# Patient Record
Sex: Female | Born: 1955 | Race: White | Hispanic: No | Marital: Single | Smoking: Never smoker
Health system: Southern US, Community
[De-identification: ages and names within clinical notes are randomized; demographics above are authoritative.]

## PROBLEM LIST (undated history)

## (undated) DIAGNOSIS — H9190 Unspecified hearing loss, unspecified ear: Secondary | ICD-10-CM

## (undated) DIAGNOSIS — W19XXXA Unspecified fall, initial encounter: Secondary | ICD-10-CM

## (undated) HISTORY — DX: Unspecified hearing loss, unspecified ear: H91.90

---

## 2018-08-31 HISTORY — PX: BREAST BIOPSY: SHX20

## 2020-06-11 ENCOUNTER — Encounter: Payer: Self-pay | Admitting: Obstetrics and Gynecology

## 2020-06-12 ENCOUNTER — Encounter: Payer: Self-pay | Admitting: Obstetrics and Gynecology

## 2020-06-12 ENCOUNTER — Ambulatory Visit: Payer: Medicaid Other | Attending: Oncology | Admitting: *Deleted

## 2020-06-12 ENCOUNTER — Other Ambulatory Visit: Payer: Self-pay

## 2020-06-12 DIAGNOSIS — R87618 Other abnormal cytological findings on specimens from cervix uteri: Secondary | ICD-10-CM

## 2020-06-12 NOTE — Progress Notes (Signed)
Patient was scheduled for a televisit with [facility] today at 9:30.  She was referred from [facility] Department for further evaluation of a recent  Abnormal pap of a HPV positive unsatisfactory pap on 04/03/20.  The patient has had a negative HPV positive pap on 10/26/18.  It was recommended by her provider to have a colposcopy.  She is scheduled for a colpo with Dr. Amalia Hailey on 06/19/20 @ 11:00.  She was contacted by Laurine Blazer to prescreen for [facility].  Health history completed.  It was noted that the patient needed an annual mammogram.  Patient had a birads 4b mammogram on 10/12/18.  A biopsy was completed of the left breast on 04/19/19 that was negative. Patient is now due for routine screening.  She is agreeable to come in to the [facility] next Wednesday at 1:00 for clinical breast exam and then get her mammogram.

## 2020-06-19 ENCOUNTER — Ambulatory Visit: Payer: Medicaid Other | Attending: Oncology

## 2020-06-19 ENCOUNTER — Encounter: Payer: Self-pay | Admitting: Obstetrics and Gynecology

## 2020-06-19 ENCOUNTER — Ambulatory Visit (INDEPENDENT_AMBULATORY_CARE_PROVIDER_SITE_OTHER): Payer: Self-pay | Admitting: Obstetrics and Gynecology

## 2020-06-19 ENCOUNTER — Other Ambulatory Visit: Payer: Self-pay

## 2020-06-19 VITALS — BP 149/94 | HR 92 | Wt 159.7 lb

## 2020-06-19 DIAGNOSIS — N72 Inflammatory disease of cervix uteri: Secondary | ICD-10-CM

## 2020-06-19 DIAGNOSIS — B977 Papillomavirus as the cause of diseases classified elsewhere: Secondary | ICD-10-CM

## 2020-06-19 NOTE — Progress Notes (Signed)
Referring Provider:  [facility]  HPI:  Jennifer Pacheco is a 64 y.o.  No obstetric history on file.  who presents today for evaluation and management of abnormal cervical cytology.    Dysplasia History:       10/26/18 -normal cytology -positive HPV     04/03/20 - " unsatisfactory" -positive HPV  ROS:  Pertinent items noted in HPI and remainder of comprehensive ROS otherwise negative.  OB History  No obstetric history on file.    History reviewed. No pertinent past medical history.  History reviewed. No pertinent surgical history.  SOCIAL HISTORY: Social History   Substance and Sexual Activity  Alcohol Use None   Social History   Substance and Sexual Activity  Drug Use Not on file     History reviewed. No pertinent family history.  ALLERGIES:  Penicillins  She has a current medication list which includes the following prescription(s): cyclobenzaprine, lisinopril, meloxicam, metformin, and omega-3.  Physical Exam: -Vitals:  BP (!) 149/94   Pulse 92   Wt 159 lb 11.2 oz (72.4 kg)   PROCEDURE: Colposcopy performed with 4% acetic acid after verbal consent obtained             Significant cervical stenosis hampering ECC. -ECC collected with Cytobrush.                -Aceto-white Lesions Location(s): 10 o'clock.              -Biopsy performed at 10 o'clock               -ECC indicated and performed: Yes.       -Biopsy sites made hemostatic with pressure and Monsel's solution   -Satisfactory colposcopy: No.    -Evidence of Invasive cervical CA :  NO  ASSESSMENT:  Jennifer Pacheco is a 64 y.o. No obstetric history on file. here for  1. High risk human papilloma virus (HPV) infection of cervix   .  PLAN: 1.  I discussed the grading system of pap smears and HPV high risk viral types.  We will discuss management after colpo results return.  No orders of the defined types were placed in this encounter.          F/U  Return in about 2 weeks (around 07/03/2020) for Colpo  f/u.  Jeannie Fend ,MD 06/19/2020,11:35 AM

## 2020-06-20 ENCOUNTER — Other Ambulatory Visit: Payer: Self-pay | Admitting: *Deleted

## 2020-06-20 ENCOUNTER — Other Ambulatory Visit: Payer: Self-pay | Admitting: Obstetrics and Gynecology

## 2020-06-20 ENCOUNTER — Inpatient Hospital Stay
Admission: RE | Admit: 2020-06-20 | Discharge: 2020-06-20 | Disposition: A | Discharge status: Home / Self Care | Payer: Self-pay | Source: Ambulatory Visit | Attending: *Deleted | Admitting: *Deleted

## 2020-06-20 DIAGNOSIS — Z0000 Encounter for general adult medical examination without abnormal findings: Secondary | ICD-10-CM

## 2020-06-20 DIAGNOSIS — Z1231 Encounter for screening mammogram for malignant neoplasm of breast: Secondary | ICD-10-CM

## 2020-06-25 ENCOUNTER — Other Ambulatory Visit: Payer: Self-pay | Admitting: *Deleted

## 2020-06-25 ENCOUNTER — Other Ambulatory Visit: Payer: Self-pay | Admitting: Neurology

## 2020-06-25 ENCOUNTER — Inpatient Hospital Stay
Admission: RE | Admit: 2020-06-25 | Discharge: 2020-06-25 | Disposition: A | Discharge status: Home / Self Care | Payer: Self-pay | Source: Ambulatory Visit | Attending: *Deleted | Admitting: *Deleted

## 2020-06-25 DIAGNOSIS — Z1231 Encounter for screening mammogram for malignant neoplasm of breast: Secondary | ICD-10-CM

## 2020-06-25 LAB — ANATOMIC PATHOLOGY REPORT

## 2020-06-28 ENCOUNTER — Telehealth: Payer: Self-pay

## 2020-06-28 NOTE — Telephone Encounter (Signed)
Called pt to move her appt the pt was schedule in the wrong time slot for the providers template. The pt was contact and told that she was schedule in the wrong time slot the pt verbally understand and was given two times that we can move her appt to. The pt said that she will call her ride and call us back.

## 2020-07-03 ENCOUNTER — Encounter: Payer: Medicaid Other | Admitting: Obstetrics and Gynecology

## 2020-07-03 ENCOUNTER — Telehealth: Payer: Self-pay

## 2020-07-03 NOTE — Telephone Encounter (Signed)
Called patient to get her rescheduled for her appointment that she no showed, was unable to reach patient, left a message for her to call the office if she desired to reschedule.

## 2020-07-04 ENCOUNTER — Encounter: Payer: Medicaid Other | Admitting: Obstetrics and Gynecology

## 2020-07-15 ENCOUNTER — Encounter: Payer: Self-pay | Admitting: Obstetrics and Gynecology

## 2020-07-15 ENCOUNTER — Other Ambulatory Visit: Payer: Self-pay

## 2020-07-15 ENCOUNTER — Ambulatory Visit (INDEPENDENT_AMBULATORY_CARE_PROVIDER_SITE_OTHER): Payer: Self-pay | Admitting: Obstetrics and Gynecology

## 2020-07-15 VITALS — BP 149/85 | HR 87 | Wt 161.7 lb

## 2020-07-15 DIAGNOSIS — N72 Inflammatory disease of cervix uteri: Secondary | ICD-10-CM

## 2020-07-15 DIAGNOSIS — B977 Papillomavirus as the cause of diseases classified elsewhere: Secondary | ICD-10-CM

## 2020-07-15 NOTE — Progress Notes (Signed)
HPI:      [name] is a 64 y.o. No obstetric history on file. who LMP was No LMP recorded. Patient is postmenopausal.  Subjective:   She presents today as a follow-up for her colposcopy.  Colposcopy revealed no cytologic abnormalities but as suspected did not contain any transition zone.  Transition zone located within the endocervical canal.  Patient repeatedly lacks cytologic abnormalities but has been found to have positive HPV.    Hx: The following portions of the patient's history were reviewed and updated as appropriate:             She  has no past medical history on file. She does not have a problem list on file. She  has no past surgical history on file. Her family history is not on file. She  has no history on file for tobacco use, alcohol use, and drug use. She has a current medication list which includes the following prescription(s): cyclobenzaprine, lisinopril, meloxicam, metformin, and omega-3. She is allergic to penicillins.       Review of Systems:  Review of Systems  Constitutional: Denied constitutional symptoms, night sweats, recent illness, fatigue, fever, insomnia and weight loss.  Eyes: Denied eye symptoms, eye pain, photophobia, vision change and visual disturbance.  Ears/Nose/Throat/Neck: Denied ear, nose, throat or neck symptoms, hearing loss, nasal discharge, sinus congestion and sore throat.  Cardiovascular: Denied cardiovascular symptoms, arrhythmia, chest pain/pressure, edema, exercise intolerance, orthopnea and palpitations.  Respiratory: Denied pulmonary symptoms, asthma, pleuritic pain, productive sputum, cough, dyspnea and wheezing.  Gastrointestinal: Denied, gastro-esophageal reflux, melena, nausea and vomiting.  Genitourinary: Denied genitourinary symptoms including symptomatic vaginal discharge, pelvic relaxation issues, and urinary complaints.  Musculoskeletal: Denied musculoskeletal symptoms, stiffness, swelling, muscle weakness and  myalgia.  Dermatologic: Denied dermatology symptoms, rash and scar.  Neurologic: Denied neurology symptoms, dizziness, headache, neck pain and syncope.  Psychiatric: Denied psychiatric symptoms, anxiety and depression.  Endocrine: Denied endocrine symptoms including hot flashes and night sweats.   Meds:   Current Outpatient Medications on File Prior to Visit  Medication Sig Dispense Refill  . cyclobenzaprine (FLEXERIL) 10 MG tablet Take by mouth.    Marland Kitchen lisinopril (ZESTRIL) 20 MG tablet Take 20 mg by mouth daily.    . meloxicam (MOBIC) 15 MG tablet Take by mouth.    . metFORMIN (GLUCOPHAGE) 500 MG tablet Take by mouth.    . Omega-3 1000 MG CAPS Take by mouth.     No current facility-administered medications on file prior to visit.          Objective:     Vitals:   07/15/20 0940  BP: (!) 149/85  Pulse: 87   Filed Weights   07/15/20 0940  Weight: 161 lb 11.2 oz (73.3 kg)              No cytologic abnormalities at colposcopy  Assessment:    No obstetric history on file. There are no problems to display for this patient.    1. High risk human papilloma virus (HPV) infection of cervix     Patient has high risk HPV but no external cytologic abnormalities.   Plan:            1.  Because of her inadequate colposcopy but no cytologic external abnormalities with positive HPV -I have recommended a follow-up Pap smear cotest in 6 months.  At that time we will also perform an ECC (either Cytobrush or regular) to further assess the endocervical canal.  I discussed the rationale  for this in detail with the patient.  All questions answered. Orders No orders of the defined types were placed in this encounter.   No orders of the defined types were placed in this encounter.     F/U  Return in about 6 months (around 01/12/2021). I spent 21 minutes involved in the care of this patient preparing to see the patient by obtaining and reviewing her medical history (including labs,  imaging tests and prior procedures), documenting clinical information in the electronic health record (EHR), counseling and coordinating care plans, writing and sending prescriptions, ordering tests or procedures and directly communicating with the patient by discussing pertinent items from her history and physical exam as well as detailing my assessment and plan as noted above so that she has an informed understanding.  All of her questions were answered.  Finis Bud, M.D. 07/15/2020 10:20 AM

## 2020-08-08 ENCOUNTER — Other Ambulatory Visit: Payer: Self-pay

## 2020-08-08 ENCOUNTER — Ambulatory Visit
Admission: RE | Admit: 2020-08-08 | Discharge: 2020-08-08 | Disposition: A | Discharge status: Home / Self Care | Payer: Medicaid Other | Source: Ambulatory Visit | Attending: Oncology | Admitting: Oncology

## 2020-08-08 DIAGNOSIS — Z0000 Encounter for general adult medical examination without abnormal findings: Secondary | ICD-10-CM | POA: Insufficient documentation

## 2020-08-08 IMAGING — MG DIGITAL SCREENING BILAT W/ TOMO W/ CAD
8 series · 8 of 24 positions shown · non-contrast
Comparison: Previous exam(s).

CLINICAL DATA: Screening.

EXAM:
DIGITAL SCREENING BILATERAL MAMMOGRAM WITH TOMO AND CAD

[R CC synth-2D]
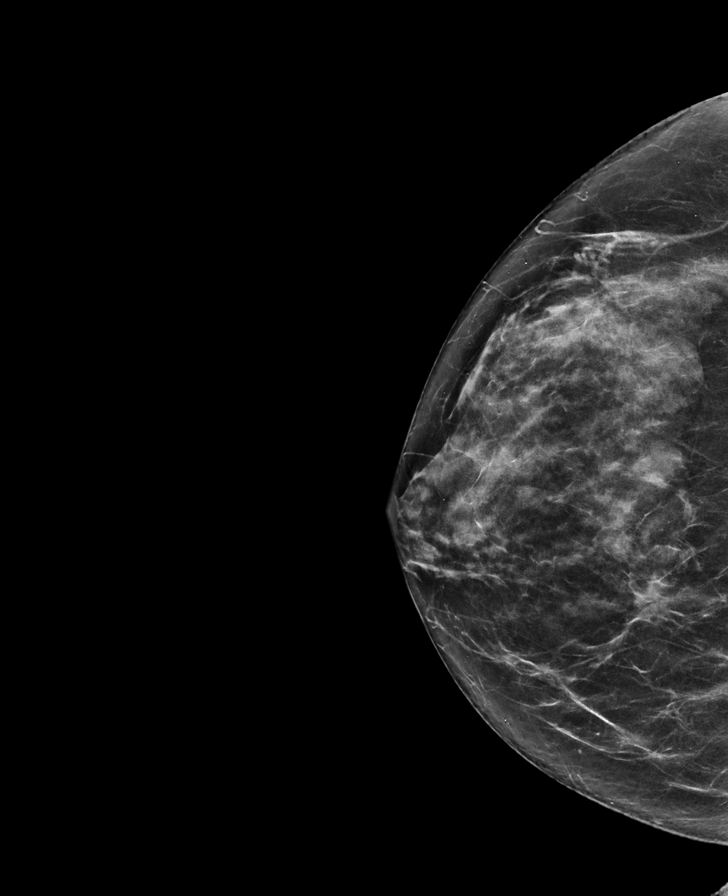

[L MLO synth-2D]
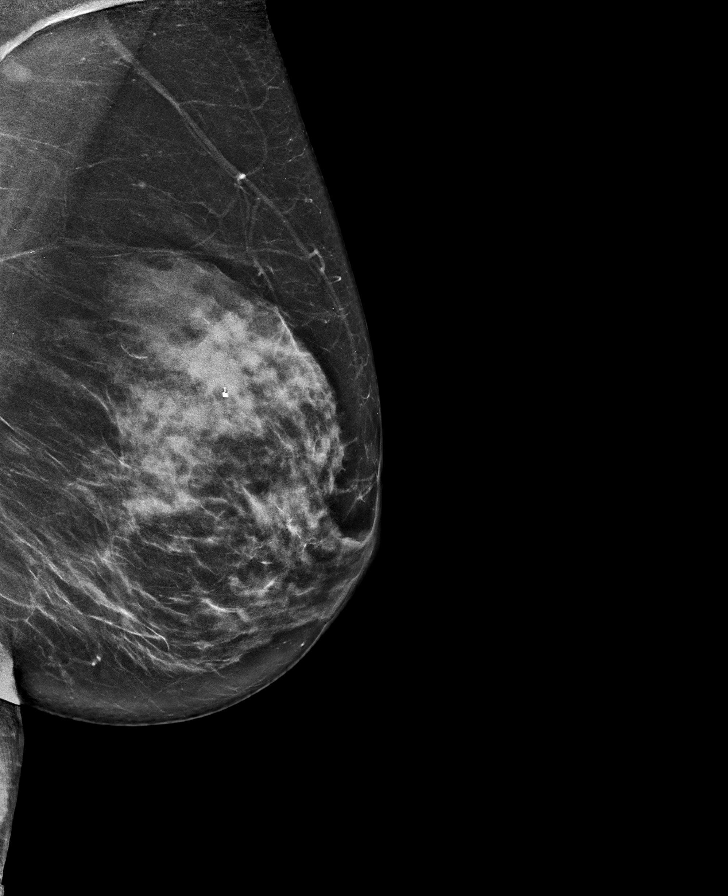

[L CC synth-2D]
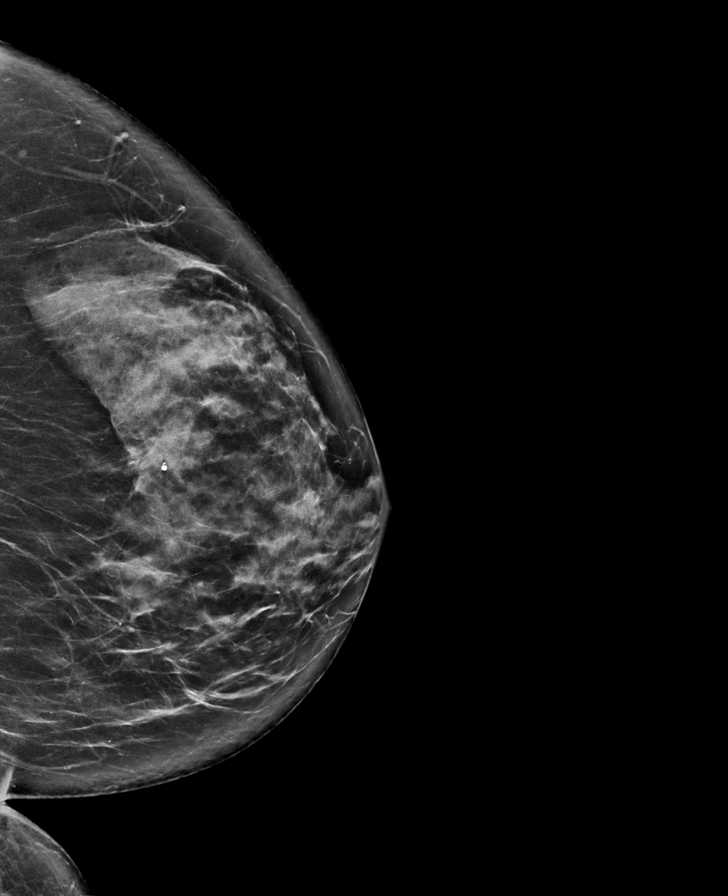

[R MLO synth-2D]
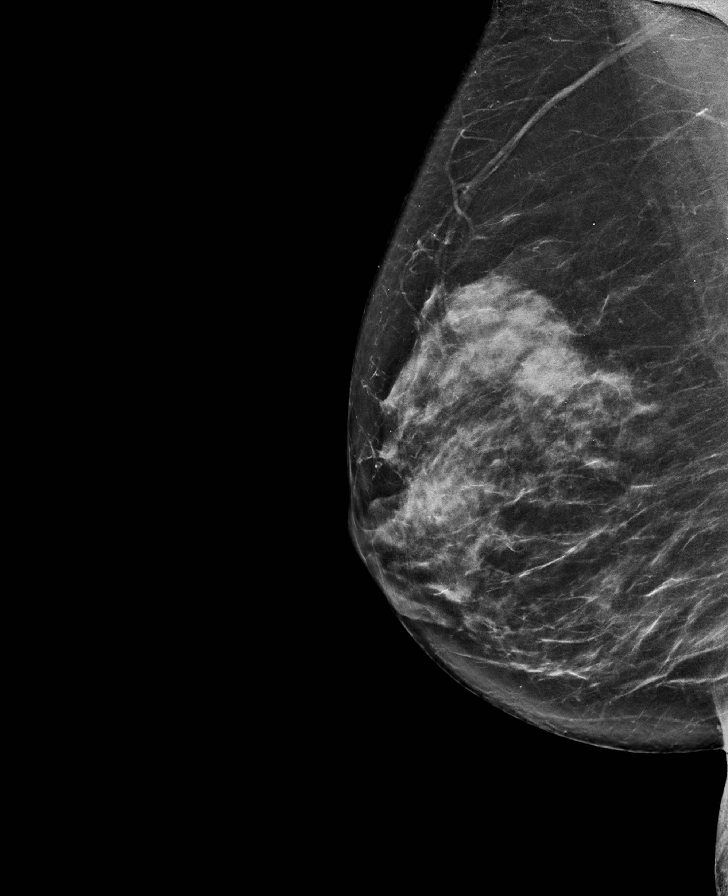

[L CC tomo · tomo slice 39/77.0]
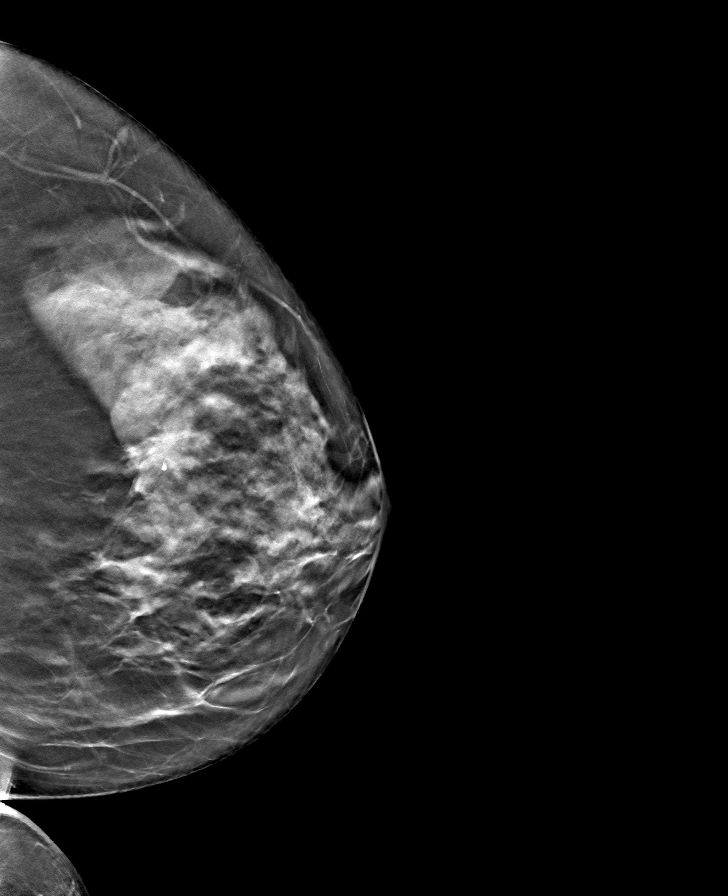

[R MLO tomo · tomo slice 41/80.0]
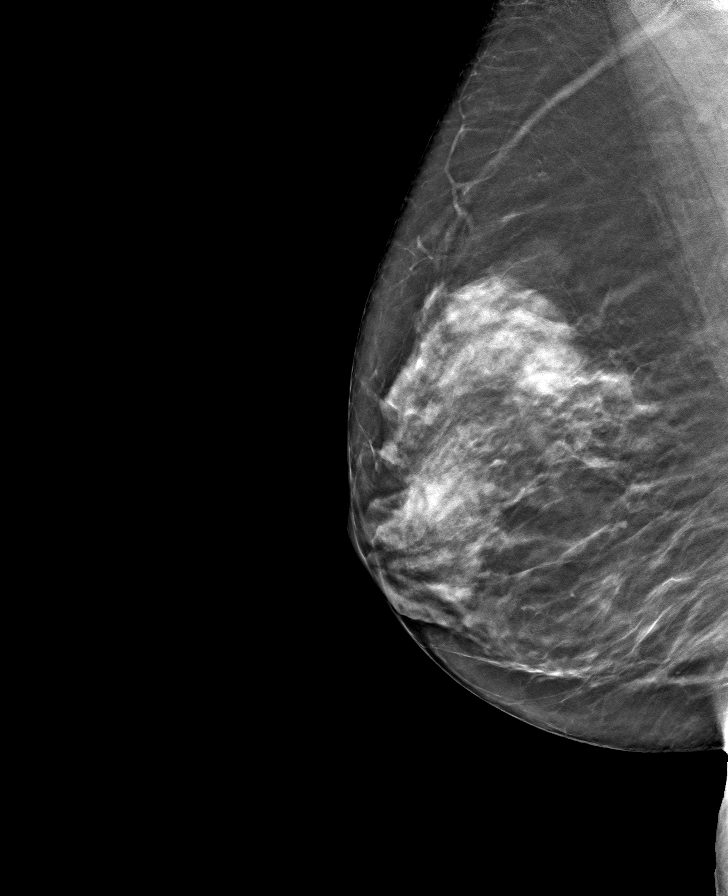

[L MLO tomo · tomo slice 37/74.0]
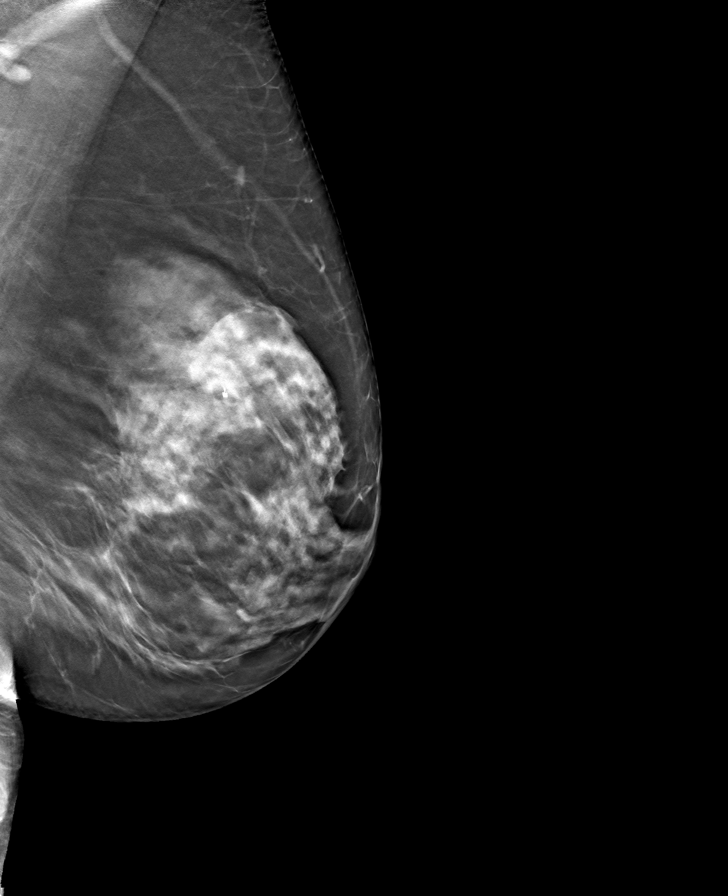

[R CC tomo · tomo slice 35/70.0]
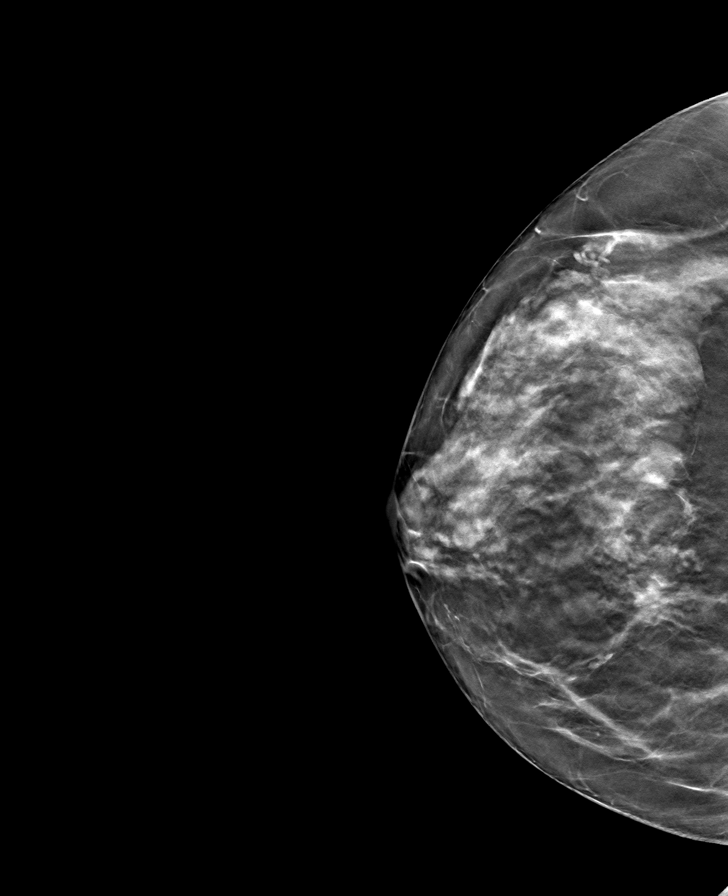

[8 of 24 positions shown; findings below may reference images not displayed]

ACR Breast Density Category c: The breast tissue is heterogeneously
dense, which may obscure small masses.
FINDINGS: There are no findings suspicious for malignancy. Images were
processed with CAD.
IMPRESSION: No mammographic evidence of malignancy. A result letter of this
screening mammogram will be mailed directly to the patient.

RECOMMENDATION:
Screening mammogram in one year. (Code:[5V])

BI-RADS CATEGORY  1: Negative.

## 2020-08-15 ENCOUNTER — Inpatient Hospital Stay
Admission: RE | Admit: 2020-08-15 | Discharge: 2020-08-15 | Disposition: A | Discharge status: Home / Self Care | Payer: Self-pay | Source: Ambulatory Visit | Attending: *Deleted | Admitting: *Deleted

## 2020-08-15 ENCOUNTER — Other Ambulatory Visit: Payer: Self-pay | Admitting: *Deleted

## 2020-08-15 ENCOUNTER — Encounter: Payer: Self-pay | Admitting: *Deleted

## 2020-08-15 DIAGNOSIS — Z1231 Encounter for screening mammogram for malignant neoplasm of breast: Secondary | ICD-10-CM

## 2021-01-14 ENCOUNTER — Other Ambulatory Visit: Payer: Self-pay

## 2021-01-14 ENCOUNTER — Ambulatory Visit (INDEPENDENT_AMBULATORY_CARE_PROVIDER_SITE_OTHER): Payer: Self-pay | Admitting: Obstetrics and Gynecology

## 2021-01-14 ENCOUNTER — Other Ambulatory Visit (HOSPITAL_COMMUNITY)
Admission: RE | Admit: 2021-01-14 | Discharge: 2021-01-14 | Disposition: A | Discharge status: Home / Self Care | Payer: Medicaid Other | Source: Ambulatory Visit | Attending: Obstetrics and Gynecology | Admitting: Obstetrics and Gynecology

## 2021-01-14 ENCOUNTER — Encounter: Payer: Self-pay | Admitting: Obstetrics and Gynecology

## 2021-01-14 VITALS — BP 146/74 | HR 80 | Ht 66.0 in | Wt 154.0 lb

## 2021-01-14 DIAGNOSIS — N72 Inflammatory disease of cervix uteri: Secondary | ICD-10-CM | POA: Diagnosis not present

## 2021-01-14 DIAGNOSIS — B977 Papillomavirus as the cause of diseases classified elsewhere: Secondary | ICD-10-CM | POA: Diagnosis present

## 2021-01-14 NOTE — Addendum Note (Signed)
Addended by: Durwin Glaze on: 01/14/2021 11:27 AM   Modules accepted: Orders

## 2021-01-14 NOTE — Progress Notes (Signed)
HPI:      [name] is a 65 y.o. No obstetric history on file. who LMP was No LMP recorded. Patient is postmenopausal.  Subjective:   She presents today for a follow-up Pap smear after previous colposcopy.  The colposcopy was somewhat inadequate and that the patient has cervical stenosis and it was difficult to visualize the T-zone.  Biopsies performed at that time showed no dysplasia. Her Pap smear prior to that was considered unsatisfactory because of a lack of cells but was positive for high risk HPV.    Hx: The following portions of the patient's history were reviewed and updated as appropriate:             She  has a past medical history of Hard of hearing. She does not have a problem list on file. She  has a past surgical history that includes Breast biopsy (Left, 2020). Her family history includes Breast cancer in her maternal aunt. She  reports that she has never smoked. She has never used smokeless tobacco. She reports previous alcohol use. She reports current drug use. Drug: Marijuana. She has a current medication list which includes the following prescription(s): cyclobenzaprine, lisinopril, meloxicam, metformin, and omega-3. She is allergic to penicillins.       Review of Systems:  Review of Systems  Constitutional: Denied constitutional symptoms, night sweats, recent illness, fatigue, fever, insomnia and weight loss.  Eyes: Denied eye symptoms, eye pain, photophobia, vision change and visual disturbance.  Ears/Nose/Throat/Neck: Denied ear, nose, throat or neck symptoms, hearing loss, nasal discharge, sinus congestion and sore throat.  Cardiovascular: Denied cardiovascular symptoms, arrhythmia, chest pain/pressure, edema, exercise intolerance, orthopnea and palpitations.  Respiratory: Denied pulmonary symptoms, asthma, pleuritic pain, productive sputum, cough, dyspnea and wheezing.  Gastrointestinal: Denied, gastro-esophageal reflux, melena, nausea and vomiting.   Genitourinary: Denied genitourinary symptoms including symptomatic vaginal discharge, pelvic relaxation issues, and urinary complaints.  Musculoskeletal: Denied musculoskeletal symptoms, stiffness, swelling, muscle weakness and myalgia.  Dermatologic: Denied dermatology symptoms, rash and scar.  Neurologic: Denied neurology symptoms, dizziness, headache, neck pain and syncope.  Psychiatric: Denied psychiatric symptoms, anxiety and depression.  Endocrine: Denied endocrine symptoms including hot flashes and night sweats.   Meds:   Current Outpatient Medications on File Prior to Visit  Medication Sig Dispense Refill  . cyclobenzaprine (FLEXERIL) 10 MG tablet Take by mouth.    Marland Kitchen lisinopril (ZESTRIL) 20 MG tablet Take 20 mg by mouth daily.    . meloxicam (MOBIC) 15 MG tablet Take by mouth.    . metFORMIN (GLUCOPHAGE) 500 MG tablet Take by mouth.    . Omega-3 1000 MG CAPS Take by mouth.     No current facility-administered medications on file prior to visit.          Objective:     Vitals:   01/14/21 1022  BP: (!) 146/74  Pulse: 80   Filed Weights   01/14/21 1022  Weight: 154 lb (69.9 kg)              Physical examination   Pelvic:   Vulva: Normal appearance.  No lesions.  Vagina: No lesions or abnormalities noted.  Moderate vaginal atrophy  Support: Normal pelvic support.  Urethra No masses tenderness or scarring.  Meatus Normal size without lesions or prolapse.  Cervix: Normal appearance.  No lesions.  Cervical stenosis noted  Anus: Normal exam.  No lesions.  Perineum: Normal exam.  No lesions.     Assessment:    No obstetric history on  file. There are no problems to display for this patient.    1. High risk human papilloma virus (HPV) infection of cervix     Previous inadequate Pap smear with follow-up colposcopy where the T-zone could not be visualized.   Plan:            1.  Two separate specimens sent today:   Pap -with Co-test   Separate ECC  Cytobrush (limited insertion because of cervical stenosis) Orders No orders of the defined types were placed in this encounter.   No orders of the defined types were placed in this encounter.     F/U  Return for We will contact her with any abnormal test results. I spent 23 minutes involved in the care of this patient preparing to see the patient by obtaining and reviewing her medical history (including labs, imaging tests and prior procedures), documenting clinical information in the electronic health record (EHR), counseling and coordinating care plans, writing and sending prescriptions, ordering tests or procedures and directly communicating with the patient by discussing pertinent items from her history and physical exam as well as detailing my assessment and plan as noted above so that she has an informed understanding.  All of her questions were answered.  Finis Bud, M.D. 01/14/2021 10:55 AM

## 2021-01-17 ENCOUNTER — Telehealth: Payer: Self-pay | Admitting: Obstetrics and Gynecology

## 2021-01-17 LAB — CYTOLOGY - PAP
Comment: NEGATIVE
Diagnosis: NEGATIVE
Diagnosis: NEGATIVE
High risk HPV: NEGATIVE

## 2021-01-17 NOTE — Telephone Encounter (Signed)
Left message notifying patient that Pap smear results are not back.  Lab was processed on 01/14/2021 and it can take up to a week for it to be resulted.  For your information.

## 2021-01-17 NOTE — Telephone Encounter (Signed)
Patient called inquiring about recent labs and is requesting results. Please Advise.

## 2021-01-28 ENCOUNTER — Telehealth: Payer: Self-pay | Admitting: Obstetrics and Gynecology

## 2021-01-28 NOTE — Telephone Encounter (Signed)
LM for patient to return call.

## 2021-01-28 NOTE — Telephone Encounter (Signed)
Notified patient that labs was normal per Dr. Amalia Hailey.

## 2021-01-28 NOTE — Telephone Encounter (Signed)
Jennifer Pacheco called and requested her results.  She states she hasn't heard anything.  Please advise.

## 2022-01-30 ENCOUNTER — Inpatient Hospital Stay (HOSPITAL_COMMUNITY)
Admission: EM | Admit: 2022-01-30 | Discharge: 2022-04-01 | DRG: 175 | Disposition: A | Discharge status: Skilled Nursing Facility | Payer: Medicaid Other | Attending: Internal Medicine | Admitting: Internal Medicine

## 2022-01-30 ENCOUNTER — Emergency Department (HOSPITAL_COMMUNITY): Payer: Medicaid Other

## 2022-01-30 ENCOUNTER — Encounter (HOSPITAL_COMMUNITY): Payer: Self-pay

## 2022-01-30 ENCOUNTER — Other Ambulatory Visit: Payer: Self-pay

## 2022-01-30 DIAGNOSIS — N179 Acute kidney failure, unspecified: Secondary | ICD-10-CM

## 2022-01-30 DIAGNOSIS — R1314 Dysphagia, pharyngoesophageal phase: Secondary | ICD-10-CM | POA: Diagnosis present

## 2022-01-30 DIAGNOSIS — G9341 Metabolic encephalopathy: Secondary | ICD-10-CM | POA: Diagnosis present

## 2022-01-30 DIAGNOSIS — E872 Acidosis, unspecified: Secondary | ICD-10-CM | POA: Diagnosis present

## 2022-01-30 DIAGNOSIS — E876 Hypokalemia: Secondary | ICD-10-CM | POA: Diagnosis not present

## 2022-01-30 DIAGNOSIS — K8020 Calculus of gallbladder without cholecystitis without obstruction: Secondary | ICD-10-CM | POA: Diagnosis present

## 2022-01-30 DIAGNOSIS — R1319 Other dysphagia: Secondary | ICD-10-CM | POA: Diagnosis present

## 2022-01-30 DIAGNOSIS — E871 Hypo-osmolality and hyponatremia: Secondary | ICD-10-CM | POA: Diagnosis present

## 2022-01-30 DIAGNOSIS — Z880 Allergy status to penicillin: Secondary | ICD-10-CM

## 2022-01-30 DIAGNOSIS — R5381 Other malaise: Secondary | ICD-10-CM | POA: Diagnosis present

## 2022-01-30 DIAGNOSIS — R451 Restlessness and agitation: Secondary | ICD-10-CM | POA: Diagnosis not present

## 2022-01-30 DIAGNOSIS — Z751 Person awaiting admission to adequate facility elsewhere: Secondary | ICD-10-CM

## 2022-01-30 DIAGNOSIS — E861 Hypovolemia: Secondary | ICD-10-CM | POA: Diagnosis present

## 2022-01-30 DIAGNOSIS — K219 Gastro-esophageal reflux disease without esophagitis: Secondary | ICD-10-CM | POA: Diagnosis not present

## 2022-01-30 DIAGNOSIS — I248 Other forms of acute ischemic heart disease: Secondary | ICD-10-CM | POA: Diagnosis present

## 2022-01-30 DIAGNOSIS — Z79899 Other long term (current) drug therapy: Secondary | ICD-10-CM

## 2022-01-30 DIAGNOSIS — G9389 Other specified disorders of brain: Secondary | ICD-10-CM

## 2022-01-30 DIAGNOSIS — I82422 Acute embolism and thrombosis of left iliac vein: Secondary | ICD-10-CM | POA: Diagnosis not present

## 2022-01-30 DIAGNOSIS — Z20822 Contact with and (suspected) exposure to covid-19: Secondary | ICD-10-CM | POA: Diagnosis present

## 2022-01-30 DIAGNOSIS — R404 Transient alteration of awareness: Secondary | ICD-10-CM | POA: Diagnosis not present

## 2022-01-30 DIAGNOSIS — E878 Other disorders of electrolyte and fluid balance, not elsewhere classified: Secondary | ICD-10-CM | POA: Diagnosis not present

## 2022-01-30 DIAGNOSIS — D320 Benign neoplasm of cerebral meninges: Secondary | ICD-10-CM | POA: Diagnosis present

## 2022-01-30 DIAGNOSIS — I82402 Acute embolism and thrombosis of unspecified deep veins of left lower extremity: Secondary | ICD-10-CM | POA: Diagnosis not present

## 2022-01-30 DIAGNOSIS — G935 Compression of brain: Secondary | ICD-10-CM | POA: Diagnosis present

## 2022-01-30 DIAGNOSIS — J9601 Acute respiratory failure with hypoxia: Secondary | ICD-10-CM

## 2022-01-30 DIAGNOSIS — I2699 Other pulmonary embolism without acute cor pulmonale: Secondary | ICD-10-CM | POA: Diagnosis present

## 2022-01-30 DIAGNOSIS — E1165 Type 2 diabetes mellitus with hyperglycemia: Secondary | ICD-10-CM | POA: Diagnosis present

## 2022-01-30 DIAGNOSIS — M6282 Rhabdomyolysis: Secondary | ICD-10-CM | POA: Diagnosis present

## 2022-01-30 DIAGNOSIS — L8990 Pressure ulcer of unspecified site, unspecified stage: Secondary | ICD-10-CM | POA: Diagnosis present

## 2022-01-30 DIAGNOSIS — D62 Acute posthemorrhagic anemia: Secondary | ICD-10-CM | POA: Diagnosis present

## 2022-01-30 DIAGNOSIS — D638 Anemia in other chronic diseases classified elsewhere: Secondary | ICD-10-CM | POA: Diagnosis present

## 2022-01-30 DIAGNOSIS — E11649 Type 2 diabetes mellitus with hypoglycemia without coma: Secondary | ICD-10-CM | POA: Diagnosis not present

## 2022-01-30 DIAGNOSIS — K2289 Other specified disease of esophagus: Secondary | ICD-10-CM

## 2022-01-30 DIAGNOSIS — R41841 Cognitive communication deficit: Secondary | ICD-10-CM | POA: Diagnosis present

## 2022-01-30 DIAGNOSIS — E119 Type 2 diabetes mellitus without complications: Secondary | ICD-10-CM | POA: Diagnosis not present

## 2022-01-30 DIAGNOSIS — R000 Tachycardia, unspecified: Secondary | ICD-10-CM | POA: Diagnosis present

## 2022-01-30 DIAGNOSIS — R482 Apraxia: Secondary | ICD-10-CM | POA: Diagnosis present

## 2022-01-30 DIAGNOSIS — D72825 Bandemia: Secondary | ICD-10-CM

## 2022-01-30 DIAGNOSIS — R410 Disorientation, unspecified: Secondary | ICD-10-CM | POA: Diagnosis not present

## 2022-01-30 DIAGNOSIS — I10 Essential (primary) hypertension: Secondary | ICD-10-CM | POA: Diagnosis present

## 2022-01-30 DIAGNOSIS — D6832 Hemorrhagic disorder due to extrinsic circulating anticoagulants: Secondary | ICD-10-CM | POA: Diagnosis not present

## 2022-01-30 DIAGNOSIS — L89152 Pressure ulcer of sacral region, stage 2: Secondary | ICD-10-CM | POA: Diagnosis present

## 2022-01-30 DIAGNOSIS — Z7984 Long term (current) use of oral hypoglycemic drugs: Secondary | ICD-10-CM

## 2022-01-30 DIAGNOSIS — Z638 Other specified problems related to primary support group: Secondary | ICD-10-CM

## 2022-01-30 DIAGNOSIS — I2609 Other pulmonary embolism with acute cor pulmonale: Principal | ICD-10-CM | POA: Diagnosis present

## 2022-01-30 DIAGNOSIS — W19XXXA Unspecified fall, initial encounter: Secondary | ICD-10-CM | POA: Diagnosis present

## 2022-01-30 DIAGNOSIS — K661 Hemoperitoneum: Secondary | ICD-10-CM | POA: Diagnosis not present

## 2022-01-30 DIAGNOSIS — R52 Pain, unspecified: Secondary | ICD-10-CM | POA: Diagnosis not present

## 2022-01-30 DIAGNOSIS — R579 Shock, unspecified: Secondary | ICD-10-CM | POA: Diagnosis not present

## 2022-01-30 DIAGNOSIS — G8194 Hemiplegia, unspecified affecting left nondominant side: Secondary | ICD-10-CM | POA: Diagnosis present

## 2022-01-30 DIAGNOSIS — Z86010 Personal history of colonic polyps: Secondary | ICD-10-CM

## 2022-01-30 DIAGNOSIS — R5383 Other fatigue: Secondary | ICD-10-CM | POA: Diagnosis not present

## 2022-01-30 DIAGNOSIS — R471 Dysarthria and anarthria: Secondary | ICD-10-CM | POA: Diagnosis present

## 2022-01-30 DIAGNOSIS — R34 Anuria and oliguria: Secondary | ICD-10-CM | POA: Diagnosis not present

## 2022-01-30 DIAGNOSIS — R571 Hypovolemic shock: Secondary | ICD-10-CM | POA: Insufficient documentation

## 2022-01-30 DIAGNOSIS — Z602 Problems related to living alone: Secondary | ICD-10-CM | POA: Diagnosis present

## 2022-01-30 DIAGNOSIS — T45515A Adverse effect of anticoagulants, initial encounter: Secondary | ICD-10-CM | POA: Diagnosis not present

## 2022-01-30 DIAGNOSIS — Z515 Encounter for palliative care: Secondary | ICD-10-CM | POA: Diagnosis not present

## 2022-01-30 DIAGNOSIS — H9190 Unspecified hearing loss, unspecified ear: Secondary | ICD-10-CM | POA: Diagnosis present

## 2022-01-30 DIAGNOSIS — M1611 Unilateral primary osteoarthritis, right hip: Secondary | ICD-10-CM | POA: Diagnosis present

## 2022-01-30 DIAGNOSIS — I9589 Other hypotension: Secondary | ICD-10-CM | POA: Diagnosis not present

## 2022-01-30 DIAGNOSIS — G936 Cerebral edema: Secondary | ICD-10-CM | POA: Diagnosis not present

## 2022-01-30 DIAGNOSIS — I2602 Saddle embolus of pulmonary artery with acute cor pulmonale: Secondary | ICD-10-CM | POA: Diagnosis not present

## 2022-01-30 DIAGNOSIS — R933 Abnormal findings on diagnostic imaging of other parts of digestive tract: Secondary | ICD-10-CM

## 2022-01-30 DIAGNOSIS — R778 Other specified abnormalities of plasma proteins: Secondary | ICD-10-CM

## 2022-01-30 DIAGNOSIS — R1310 Dysphagia, unspecified: Secondary | ICD-10-CM | POA: Diagnosis not present

## 2022-01-30 DIAGNOSIS — N10 Acute tubulo-interstitial nephritis: Secondary | ICD-10-CM | POA: Diagnosis present

## 2022-01-30 DIAGNOSIS — I959 Hypotension, unspecified: Secondary | ICD-10-CM | POA: Diagnosis not present

## 2022-01-30 HISTORY — DX: Unspecified fall, initial encounter: W19.XXXA

## 2022-01-30 LAB — COMPREHENSIVE METABOLIC PANEL
ALT: 35 U/L (ref 0–44)
ALT: 37 U/L (ref 0–44)
AST: 69 U/L — ABNORMAL HIGH (ref 15–41)
AST: 59 U/L — ABNORMAL HIGH (ref 15–41)
Albumin: 3.4 g/dL — ABNORMAL LOW (ref 3.5–5.0)
Albumin: 2.8 g/dL — ABNORMAL LOW (ref 3.5–5.0)
Alkaline Phosphatase: 60 U/L (ref 38–126)
Alkaline Phosphatase: 52 U/L (ref 38–126)
Anion gap: 12 (ref 5–15)
Anion gap: 11 (ref 5–15)
BUN: 38 mg/dL — ABNORMAL HIGH (ref 8–23)
BUN: 26 mg/dL — ABNORMAL HIGH (ref 8–23)
CO2: 19 mmol/L — ABNORMAL LOW (ref 22–32)
CO2: 19 mmol/L — ABNORMAL LOW (ref 22–32)
Calcium: 8.6 mg/dL — ABNORMAL LOW (ref 8.9–10.3)
Calcium: 8.3 mg/dL — ABNORMAL LOW (ref 8.9–10.3)
Chloride: 104 mmol/L (ref 98–111)
Chloride: 105 mmol/L (ref 98–111)
Creatinine, Ser: 0.95 mg/dL (ref 0.44–1.00)
Creatinine, Ser: 0.66 mg/dL (ref 0.44–1.00)
GFR, Estimated: >60 mL/min (ref >60)
GFR, Estimated: >60 mL/min (ref >60)
Glucose, Bld: 209 mg/dL — ABNORMAL HIGH (ref 70–99)
Glucose, Bld: 137 mg/dL — ABNORMAL HIGH (ref 70–99)
Potassium: 3.2 mmol/L — ABNORMAL LOW (ref 3.5–5.1)
Potassium: 3.7 mmol/L (ref 3.5–5.1)
Sodium: 135 mmol/L (ref 135–145)
Sodium: 135 mmol/L (ref 135–145)
Total Bilirubin: 0.7 mg/dL (ref 0.3–1.2)
Total Bilirubin: 0.5 mg/dL (ref 0.3–1.2)
Total Protein: 7.4 g/dL (ref 6.5–8.1)
Total Protein: 6.3 g/dL — ABNORMAL LOW (ref 6.5–8.1)

## 2022-01-30 LAB — CBC WITH DIFFERENTIAL/PLATELET
Abs Immature Granulocytes: 0.2 10*3/uL — ABNORMAL HIGH (ref 0.00–0.07)
Basophils Absolute: 0.1 10*3/uL (ref 0.0–0.1)
Basophils Relative: 0 %
Eosinophils Absolute: 0 10*3/uL (ref 0.0–0.5)
Eosinophils Relative: 0 %
HCT: 27.4 % — ABNORMAL LOW (ref 36.0–46.0)
Hemoglobin: 7.9 g/dL — ABNORMAL LOW (ref 12.0–15.0)
Immature Granulocytes: 1 %
Lymphocytes Relative: 4 %
Lymphs Abs: 1 10*3/uL (ref 0.7–4.0)
MCH: 19.2 pg — ABNORMAL LOW (ref 26.0–34.0)
MCHC: 28.8 g/dL — ABNORMAL LOW (ref 30.0–36.0)
MCV: 66.5 fL — ABNORMAL LOW (ref 80.0–100.0)
Monocytes Absolute: 1.6 10*3/uL — ABNORMAL HIGH (ref 0.1–1.0)
Monocytes Relative: 7 %
Neutro Abs: 20.9 10*3/uL — ABNORMAL HIGH (ref 1.7–7.7)
Neutrophils Relative %: 88 %
Platelets: 578 10*3/uL — ABNORMAL HIGH (ref 150–400)
RBC: 4.12 MIL/uL (ref 3.87–5.11)
RDW: 20.8 % — ABNORMAL HIGH (ref 11.5–15.5)
WBC: 23.7 10*3/uL — ABNORMAL HIGH (ref 4.0–10.5)
nRBC: 0.1 % (ref 0.0–0.2)

## 2022-01-30 LAB — PROTIME-INR
INR: 1.3 — ABNORMAL HIGH (ref 0.8–1.2)
Prothrombin Time: 16.1 seconds — ABNORMAL HIGH (ref 11.4–15.2)

## 2022-01-30 LAB — URINALYSIS, ROUTINE W REFLEX MICROSCOPIC
Bilirubin Urine: NEGATIVE
Glucose, UA: NEGATIVE mg/dL
Ketones, ur: 20 mg/dL — AB
Nitrite: POSITIVE — AB
Protein, ur: 30 mg/dL — AB
Specific Gravity, Urine: 1.044 — ABNORMAL HIGH (ref 1.005–1.030)
pH: 5 (ref 5.0–8.0)

## 2022-01-30 LAB — I-STAT CHEM 8, ED
BUN: 34 mg/dL — ABNORMAL HIGH (ref 8–23)
Calcium, Ion: 1.13 mmol/L — ABNORMAL LOW (ref 1.15–1.40)
Chloride: 102 mmol/L (ref 98–111)
Creatinine, Ser: 0.9 mg/dL (ref 0.44–1.00)
Glucose, Bld: 209 mg/dL — ABNORMAL HIGH (ref 70–99)
HCT: 30 % — ABNORMAL LOW (ref 36.0–46.0)
Hemoglobin: 10.2 g/dL — ABNORMAL LOW (ref 12.0–15.0)
Potassium: 3.2 mmol/L — ABNORMAL LOW (ref 3.5–5.1)
Sodium: 136 mmol/L (ref 135–145)
TCO2: 22 mmol/L (ref 22–32)

## 2022-01-30 LAB — RAPID URINE DRUG SCREEN, HOSP PERFORMED
Amphetamines: NOT DETECTED
Barbiturates: NOT DETECTED
Benzodiazepines: NOT DETECTED
Cocaine: NOT DETECTED
Opiates: NOT DETECTED
Tetrahydrocannabinol: NOT DETECTED

## 2022-01-30 LAB — BLOOD GAS, ARTERIAL
Acid-base deficit: 2.8 mmol/L — ABNORMAL HIGH (ref 0.0–2.0)
Bicarbonate: 21.2 mmol/L (ref 20.0–28.0)
Drawn by: 22179
FIO2: 28 %
O2 Saturation: 94.3 %
Patient temperature: 36.9
pCO2 arterial: 32 mmHg (ref 32–48)
pH, Arterial: 7.43 (ref 7.35–7.45)
pO2, Arterial: 61 mmHg — ABNORMAL LOW (ref 83–108)

## 2022-01-30 LAB — TROPONIN I (HIGH SENSITIVITY)
Troponin I (High Sensitivity): 99 ng/L — ABNORMAL HIGH (ref <18)
Troponin I (High Sensitivity): 473 ng/L (ref <18)
Troponin I (High Sensitivity): 802 ng/L (ref <18)

## 2022-01-30 LAB — CBG MONITORING, ED: Glucose-Capillary: 263 mg/dL — ABNORMAL HIGH (ref 70–99)

## 2022-01-30 LAB — MRSA NEXT GEN BY PCR, NASAL: MRSA by PCR Next Gen: NOT DETECTED

## 2022-01-30 LAB — CBC
HCT: 23.9 % — ABNORMAL LOW (ref 36.0–46.0)
Hemoglobin: 6.8 g/dL — CL (ref 12.0–15.0)
MCH: 18.8 pg — ABNORMAL LOW (ref 26.0–34.0)
MCHC: 28.5 g/dL — ABNORMAL LOW (ref 30.0–36.0)
MCV: 66.2 fL — ABNORMAL LOW (ref 80.0–100.0)
Platelets: 447 10*3/uL — ABNORMAL HIGH (ref 150–400)
RBC: 3.61 MIL/uL — ABNORMAL LOW (ref 3.87–5.11)
RDW: 20.3 % — ABNORMAL HIGH (ref 11.5–15.5)
WBC: 15.7 10*3/uL — ABNORMAL HIGH (ref 4.0–10.5)
nRBC: 0 % (ref 0.0–0.2)

## 2022-01-30 LAB — RESP PANEL BY RT-PCR (FLU A&B, COVID) ARPGX2
Influenza A by PCR: NEGATIVE
Influenza B by PCR: NEGATIVE
SARS Coronavirus 2 by RT PCR: NEGATIVE

## 2022-01-30 LAB — LIPASE, BLOOD: Lipase: 37 U/L (ref 11–51)

## 2022-01-30 LAB — LACTIC ACID, PLASMA
Lactic Acid, Venous: 3.9 mmol/L (ref 0.5–1.9)
Lactic Acid, Venous: 2.6 mmol/L (ref 0.5–1.9)
Lactic Acid, Venous: 1.6 mmol/L (ref 0.5–1.9)

## 2022-01-30 LAB — MAGNESIUM: Magnesium: 1.7 mg/dL (ref 1.7–2.4)

## 2022-01-30 LAB — GLUCOSE, CAPILLARY: Glucose-Capillary: 132 mg/dL — ABNORMAL HIGH (ref 70–99)

## 2022-01-30 LAB — HEMOGLOBIN A1C
Hgb A1c MFr Bld: 5.5 % (ref 4.8–5.6)
Mean Plasma Glucose: 111.15 mg/dL

## 2022-01-30 LAB — TYPE AND SCREEN
ABO/RH(D): A POS
Antibody Screen: NEGATIVE

## 2022-01-30 LAB — BRAIN NATRIURETIC PEPTIDE: B Natriuretic Peptide: 196.7 pg/mL — ABNORMAL HIGH (ref 0.0–100.0)

## 2022-01-30 LAB — SALICYLATE LEVEL: Salicylate Lvl: <7 mg/dL — ABNORMAL LOW (ref 7.0–30.0)

## 2022-01-30 LAB — ACETAMINOPHEN LEVEL: Acetaminophen (Tylenol), Serum: <10 ug/mL — ABNORMAL LOW (ref 10–30)

## 2022-01-30 LAB — CK: Total CK: 1651 U/L — ABNORMAL HIGH (ref 38–234)

## 2022-01-30 LAB — ETHANOL: Alcohol, Ethyl (B): <10 mg/dL (ref <10)

## 2022-01-30 LAB — HEPARIN LEVEL (UNFRACTIONATED): Heparin Unfractionated: >1.1 IU/mL — ABNORMAL HIGH (ref 0.30–0.70)

## 2022-01-30 IMAGING — CT CT HEAD W/O CM
4 of 8 series · 14 of 47 positions shown, 15 images · non-contrast
Comparison: None.

CLINICAL DATA: Provided history: Mental status change, unknown
cause. Additional history provided: Mental status change, sepsis



[Series 2: head w o · axial · 0.48mm/px · z∈[-15,+70]mm · 3 of 35 slices shown, 4 images]
[im 9/35  brain]
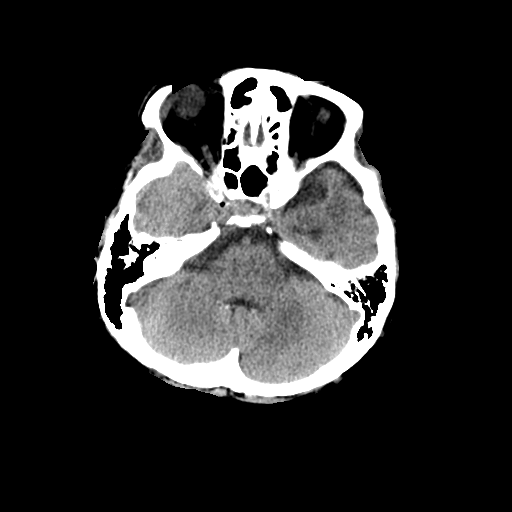
[im 9/35  bone]
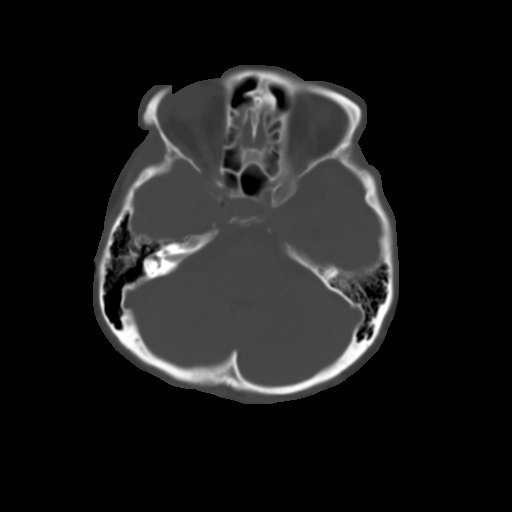
[im 18/35  brain]
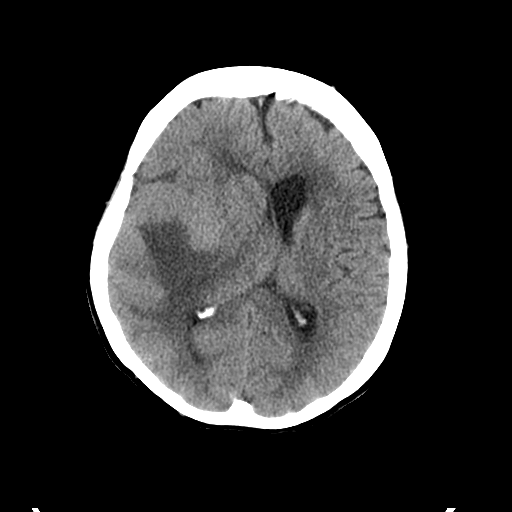
[im 26/35  brain]
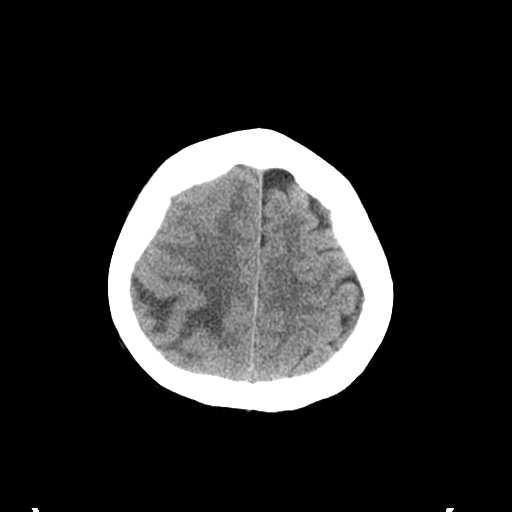

[Series 3: head bone · axial · 0.48mm/px · z∈[-43,+37]mm · 5 of 87 slices shown]
[im 7/87  bone]
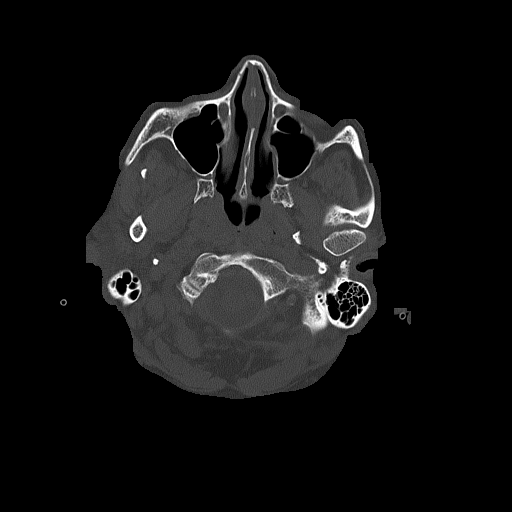
[im 20/87  bone]
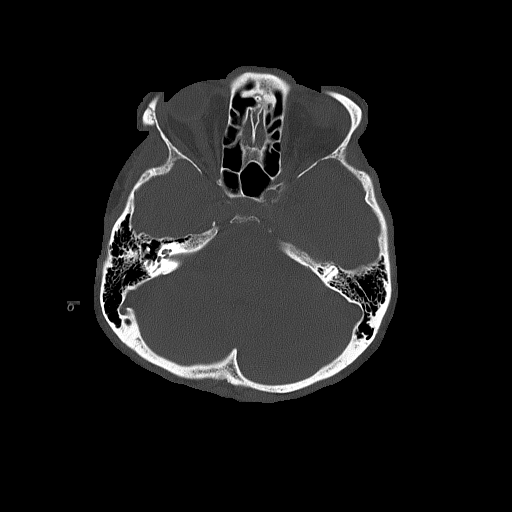
[im 27/87  bone]
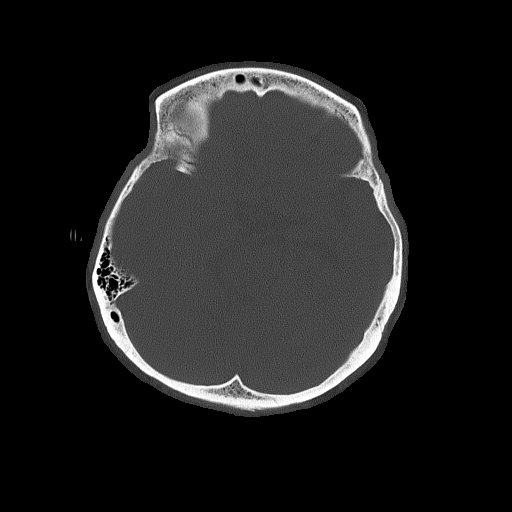
[im 40/87  bone]
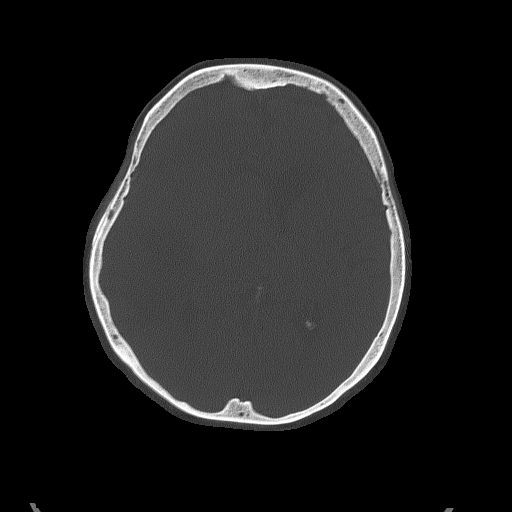
[im 47/87  bone]
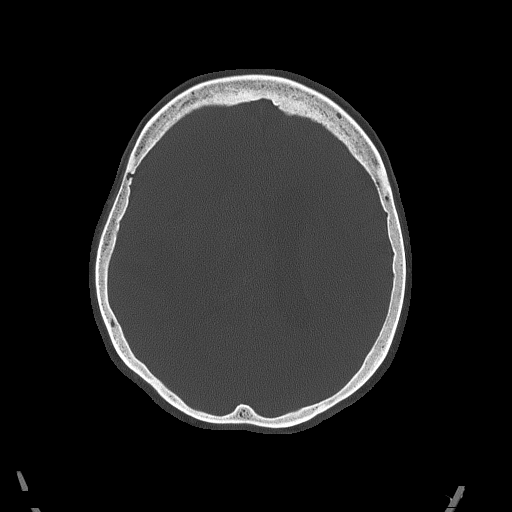

[Series 8: coronal soft · coronal · 0.22mm/px · 3 of 66 slices shown]
[im 17/66  brain]
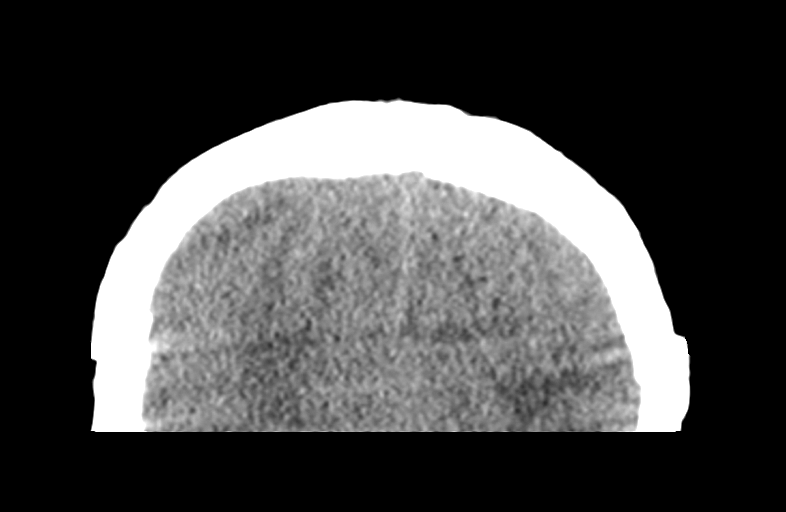
[im 33/66  brain]
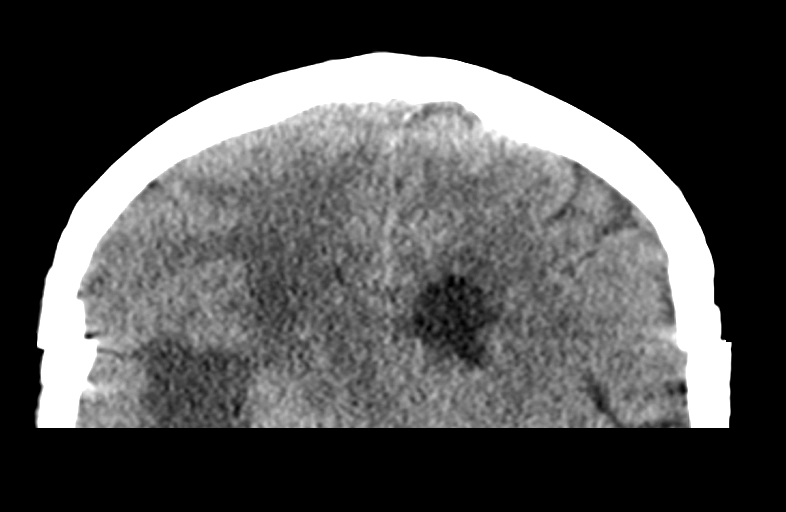
[im 49/66  brain]
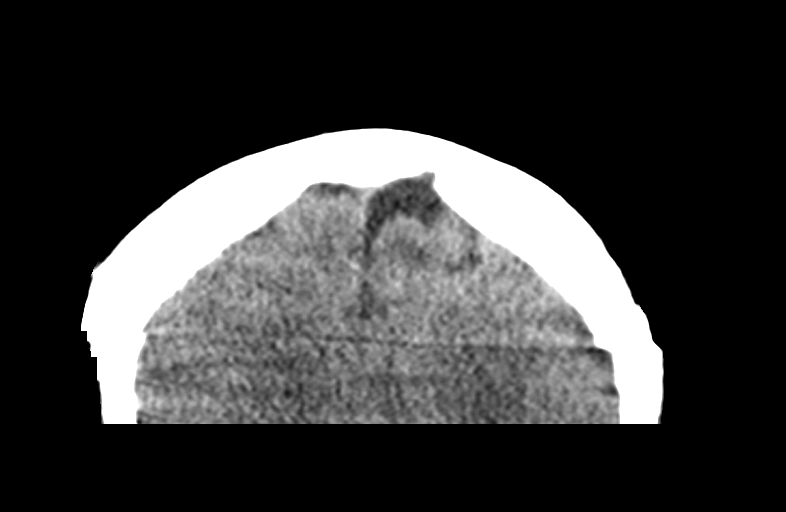

[Series 9: sagittal soft · sagittal · 0.22mm/px · 3 of 59 slices shown]
[im 12/59  brain]
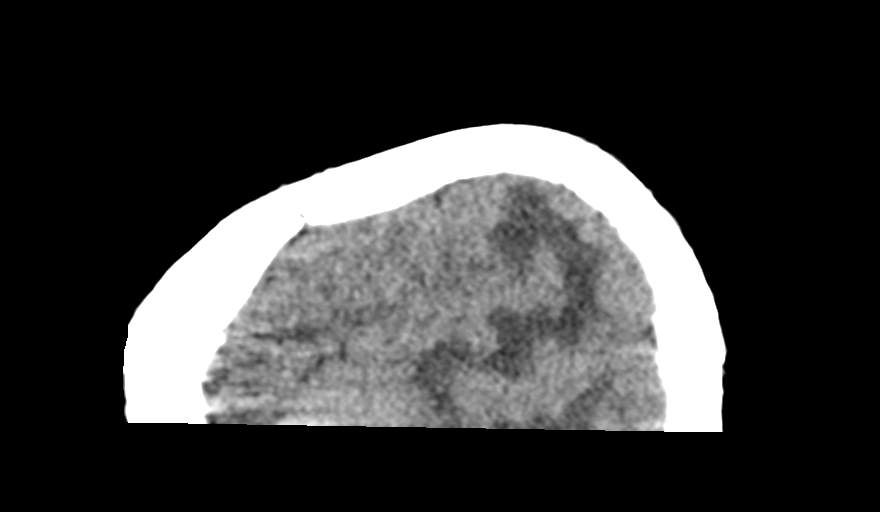
[im 24/59  brain]
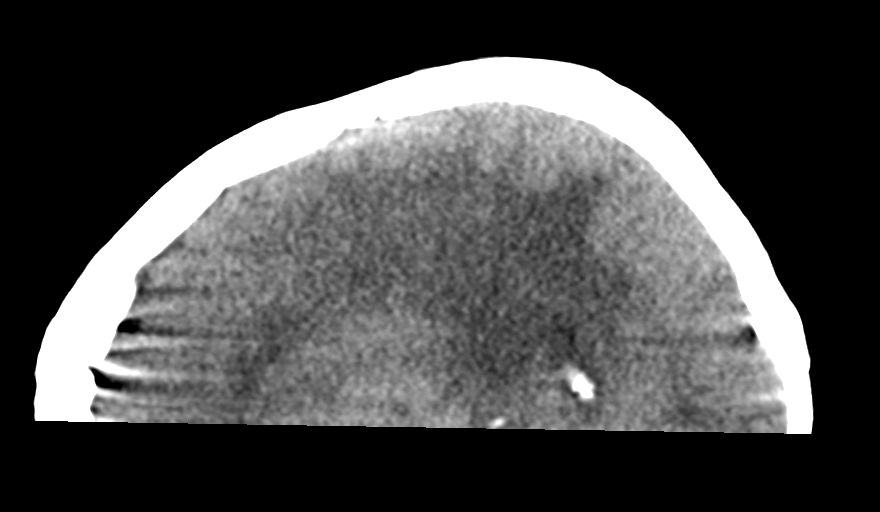
[im 35/59  brain]
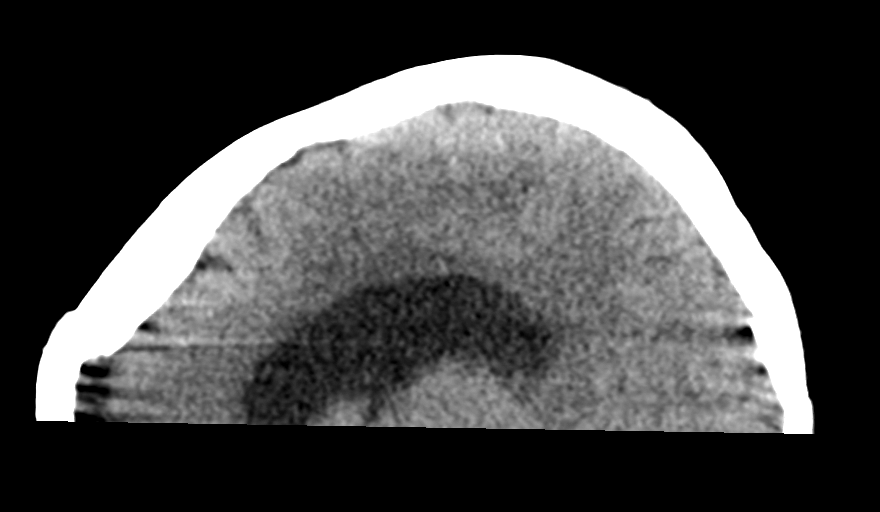

[14 of 47 positions shown; findings below may reference images not displayed]

FINDINGS: Brain:

Mild generalized cerebral atrophy.

Large subtly hyperdense irregular mass centered in the right basal
ganglia, right insula and adjacent right frontotemporal lobes.
Measurements are difficult to provide due to the irregularity and
ill-defined margins of the mass. Severe surrounding edema within the
right cerebral hemisphere. Mass effect with near complete effacement
of the right lateral ventricle and 11 mm leftward midline shift
measured at the level of the septum pellucidum.

Background patchy and ill-defined hypoattenuation within the
cerebral white matter, nonspecific but compatible with chronic small
vessel ischemic disease.

There is no acute intracranial hemorrhage.

No demarcated cortical infarct.

No extra-axial fluid collection.

Vascular: No hyperdense vessel.  Atherosclerotic calcifications.

Skull: No fracture or aggressive osseous lesion.

Sinuses/Orbits: No mass or acute finding within the imaged orbits.
Frothy secretions and small fluid level within the right sphenoid
sinus.

These results were called by telephone at the time of interpretation
on [DATE] at [DATE] to provider DEEQA RAYAAN , who verbally
acknowledged these results.
IMPRESSION: Large subtly hyperdense and irregular mass centered in the right
basal ganglia, right insula and adjacent right frontotemporal lobes.
Severe surrounding edema within the right cerebral hemisphere.
Resultant mass effect with near complete effacement of the right
lateral ventricle and 11 mm leftward midline shift. This is favored
to reflect a high-grade CNS neoplasm (such as glioblastoma
multiforme). A brain MRI without and with contrast is recommended.
Neurosurgical consultation is also recommended.

Background chronic small ischemic changes within the cerebral white
matter.

Mild generalized cerebral atrophy.

Right sphenoid sinusitis.

## 2022-01-30 IMAGING — DX DG CHEST 1V
1 series · 1 of 1 positions shown · non-contrast
Comparison: None available

CLINICAL DATA: Altered mental status.  Fall.

EXAM:
CHEST  1 VIEW

[chest ap]
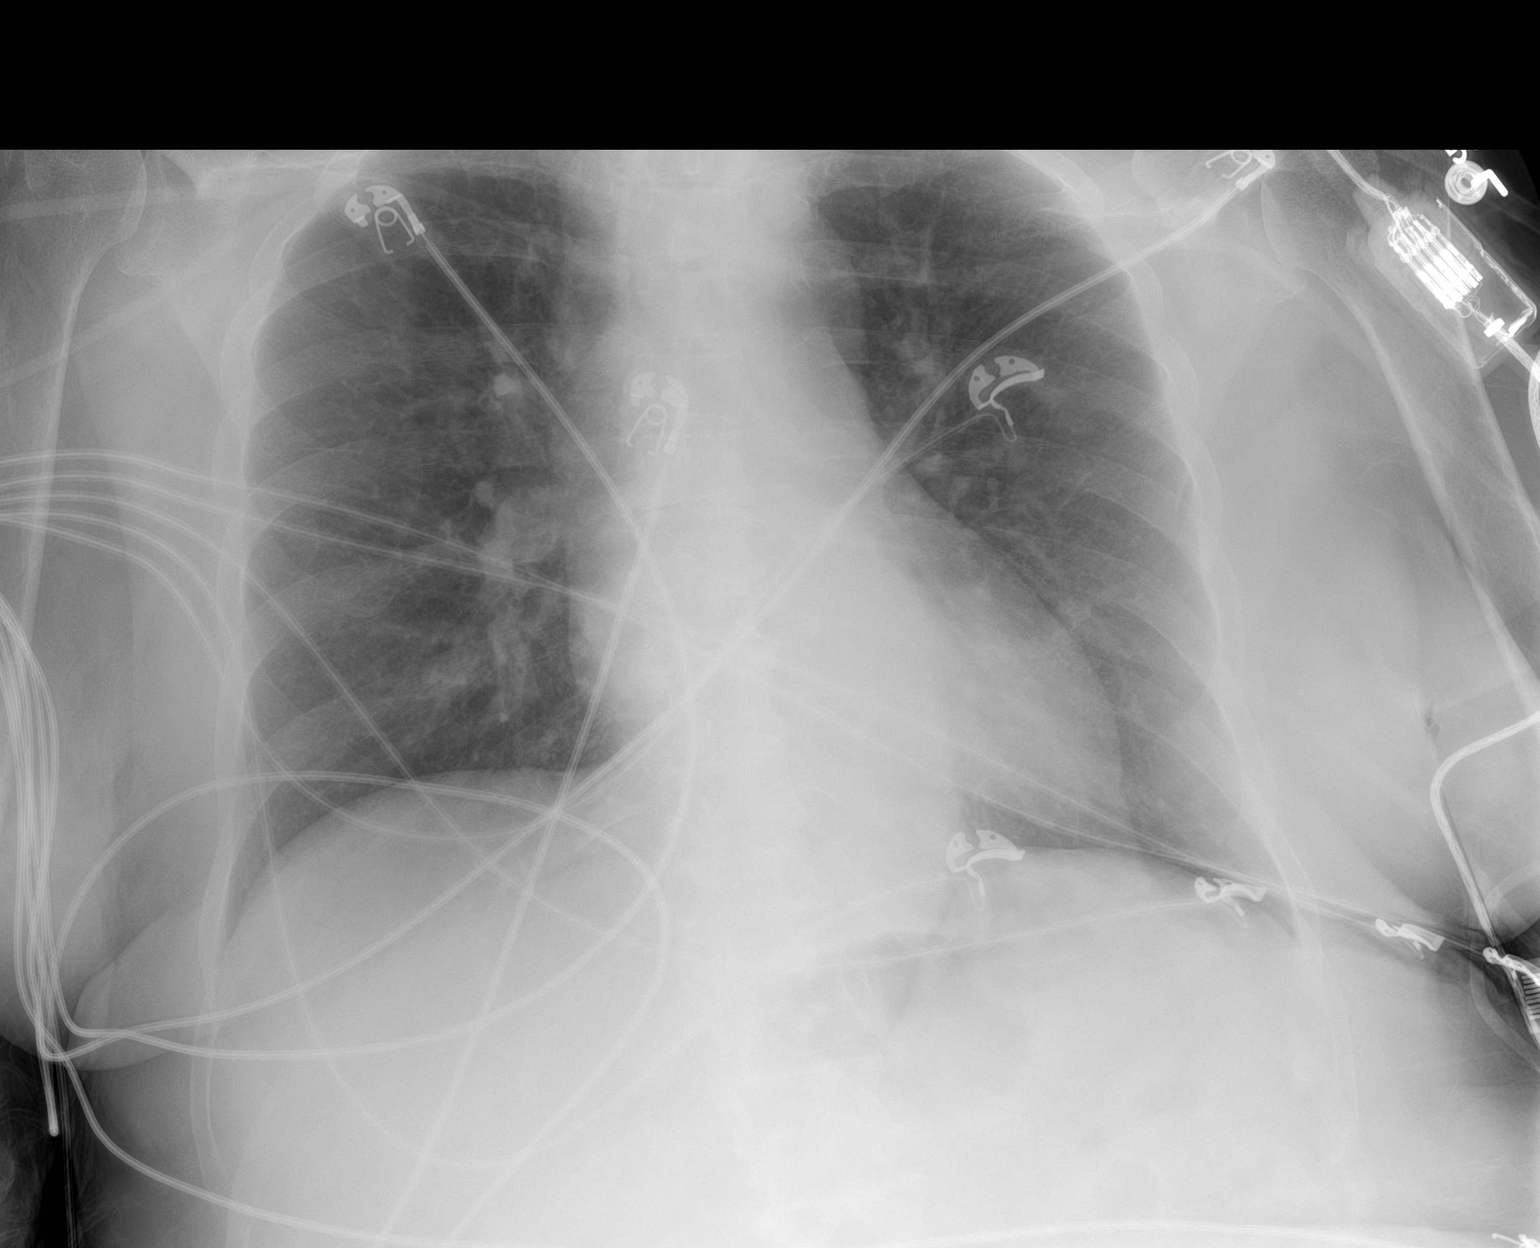

[1 of 1 positions shown; findings below may reference images not displayed]

FINDINGS: Cardiac silhouette and mediastinal contours within normal limits.
The lungs are clear. No pleural effusion or pneumothorax. Mild
dextrocurvature of the midthoracic spine with moderate multilevel
degenerative disc changes.
IMPRESSION: No active disease.

## 2022-01-30 IMAGING — CT CT ABD-PELV W/ CM
2 of 5 series · 13 of 46 positions shown, 15 images · IV contrast (Omnipaque or Isovue)
Comparison: Abdomen evaluation of the same date.

CLINICAL DATA: Altered mental status and sepsis.

* Tracking Code: BO *
EXAM:
CT ANGIOGRAPHY CHEST
CT ABDOMEN AND PELVIS WITH CONTRAST
TECHNIQUE: Multidetector CT imaging of the chest was performed using the
standard protocol during bolus administration of intravenous
contrast. Multiplanar CT image reconstructions and MIPs were
obtained to evaluate the vascular anatomy. Multidetector CT imaging
of the abdomen and pelvis was performed using the standard protocol
during bolus administration of intravenous contrast.

[Series 4: abd/pel w/ · axial · 0.83mm/px · z∈[-768,-373]mm · 10 of 95 slices shown, 12 images]
[im 8/95  soft-tissue]
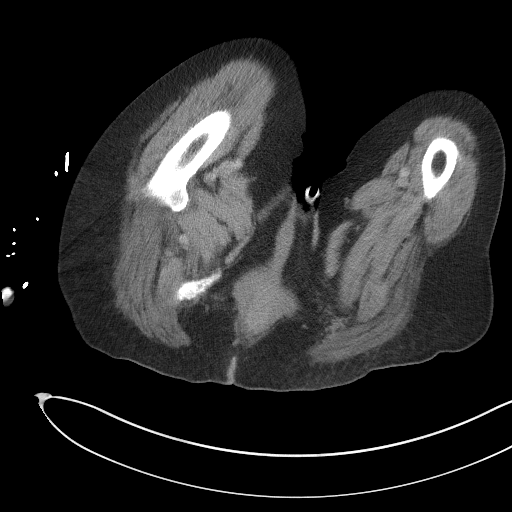
[im 8/95  bone]
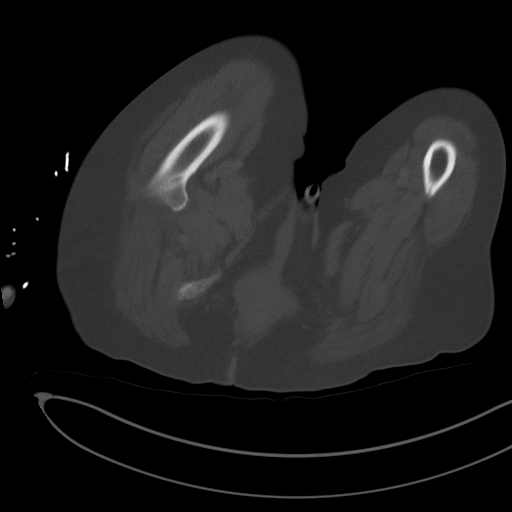
[im 15/95  soft-tissue]
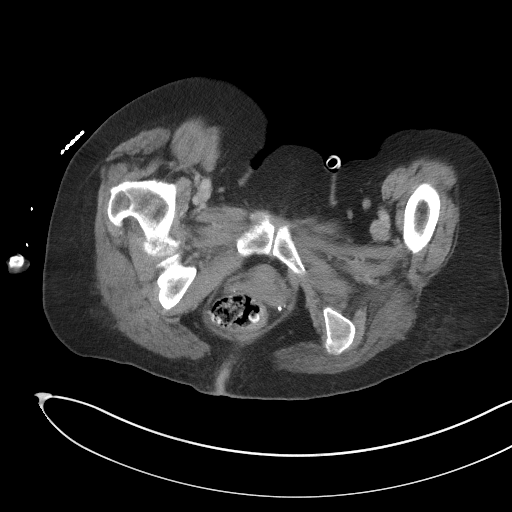
[im 29/95  soft-tissue]
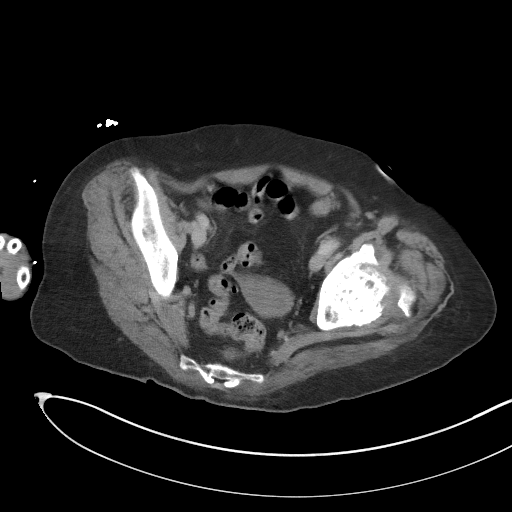
[im 37/95  soft-tissue]
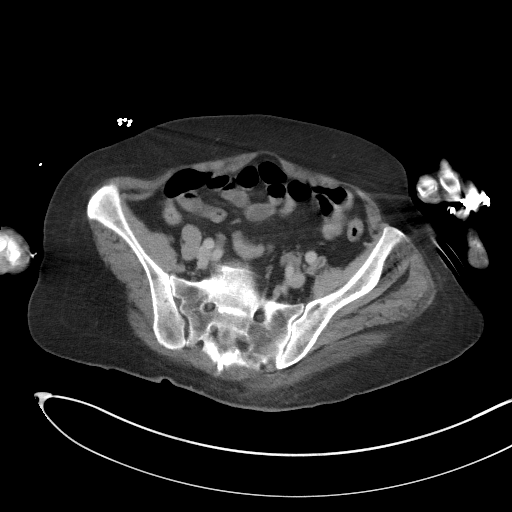
[im 44/95  soft-tissue]
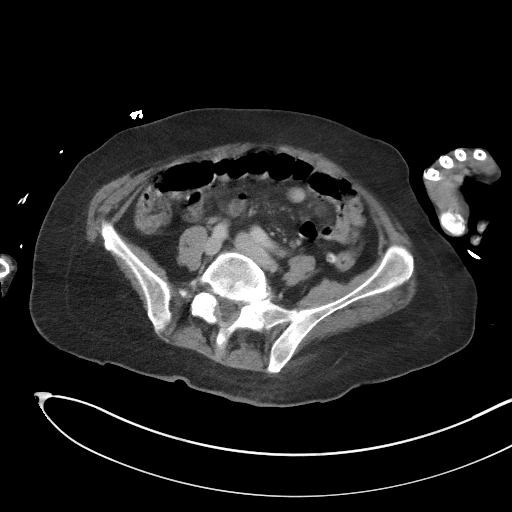
[im 51/95  soft-tissue]
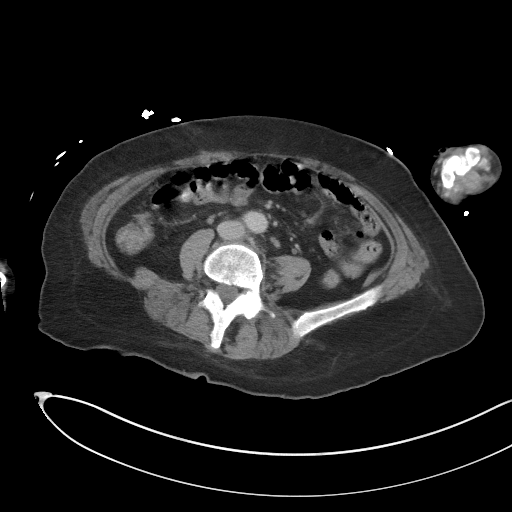
[im 58/95  soft-tissue]
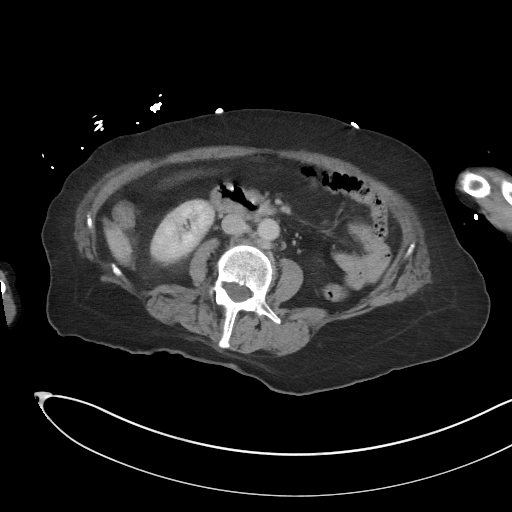
[im 73/95  soft-tissue]
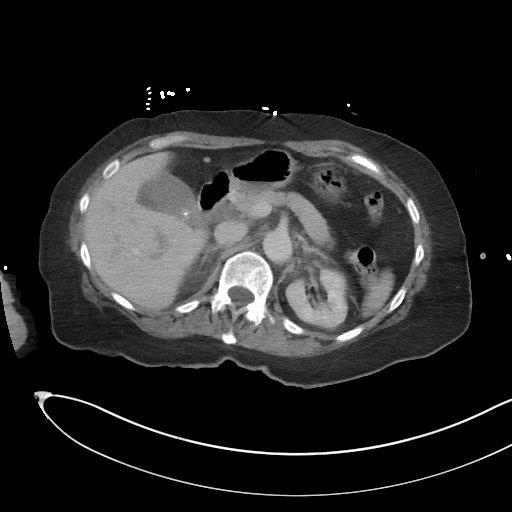
[im 80/95  soft-tissue]
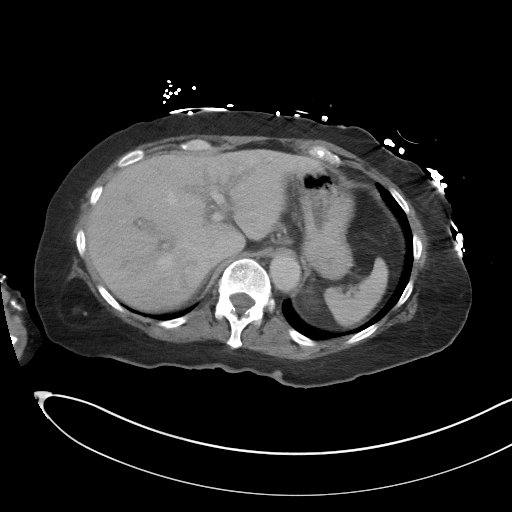
[im 80/95  bone]
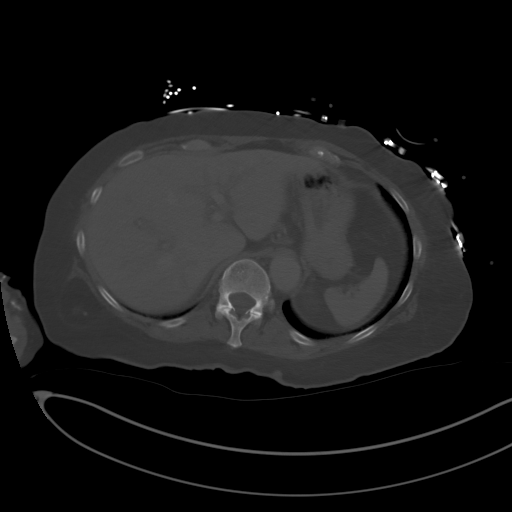
[im 87/95  soft-tissue]
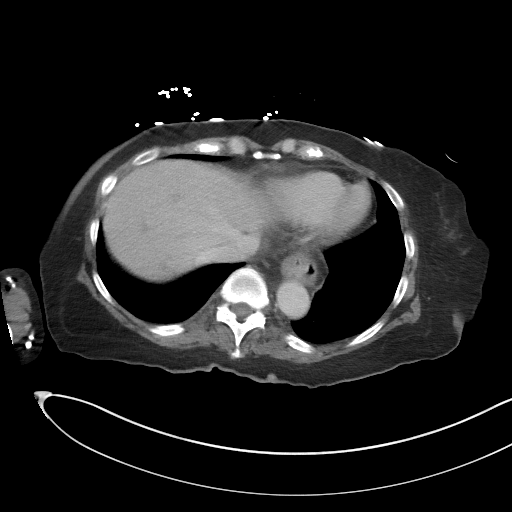

[Series 6: coronal · coronal · 0.76mm/px · 3 of 93 slices shown]
[im 31/93  soft-tissue]
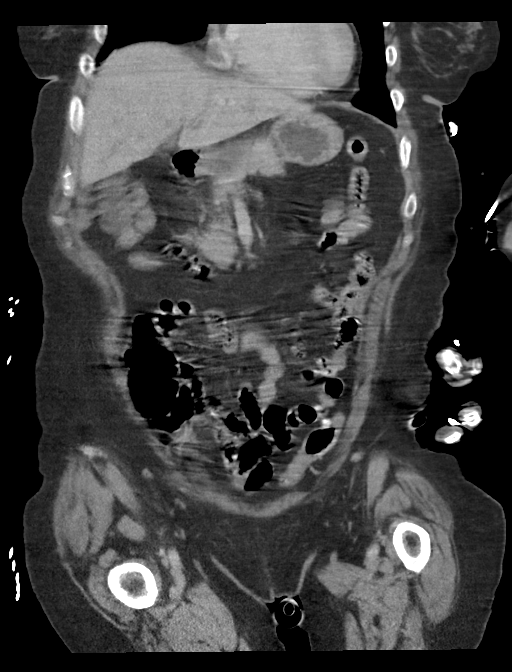
[im 41/93  soft-tissue]
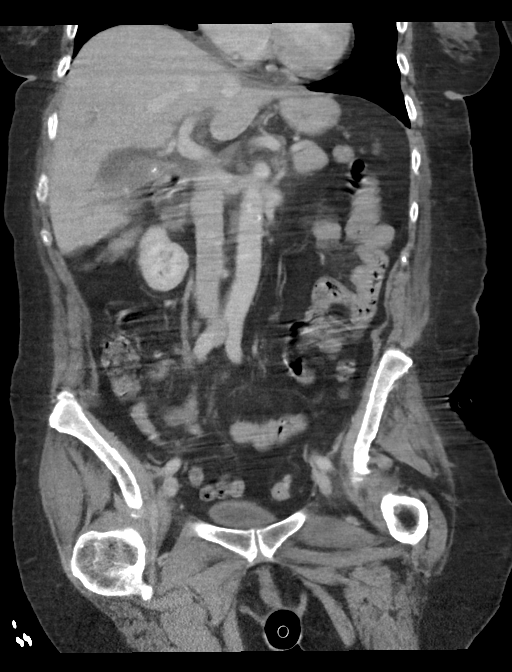
[im 52/93  soft-tissue]
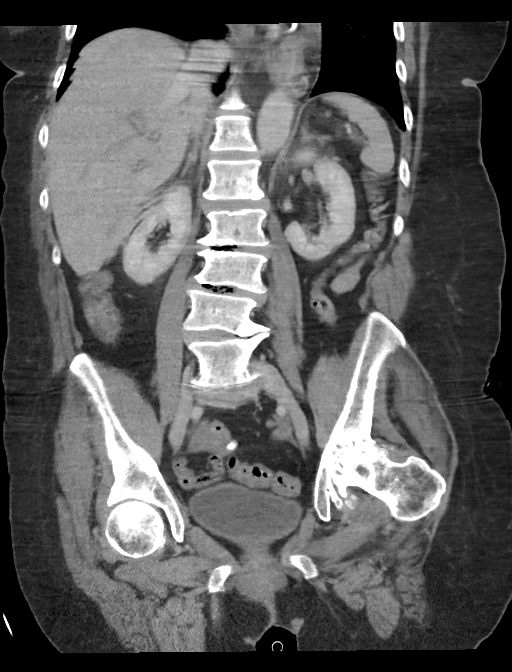

[13 of 46 positions shown; findings below may reference images not displayed]

RADIATION DOSE REDUCTION: This exam was performed according to the
departmental dose-optimization program which includes automated
exposure control, adjustment of the mA and/or kV according to
patient size and/or use of iterative reconstruction technique.

CONTRAST:  100mL OMNIPAQUE IOHEXOL 350 MG/ML SOLN
FINDINGS: CTA CHEST FINDINGS

Cardiovascular: There is thrombus in the RIGHT lower lobe artery
with extension into RIGHT middle lobe branches, at the segmental
level. There is also thrombus in RIGHT lower lobe branches at the
central segmental level. Near occlusive thrombus to posterior RIGHT
lower lobe branches.

Central segmental involvement of all upper lobe branches on the
RIGHT as well.

Similar pattern of thrombosis in the LEFT chest also with lower lobe
level, lobar level embolus perhaps bridging into upper lobe branches
and filling lower lobe branches as well, lobar and segmental level
emboli throughout the LEFT chest. There are multiple segmental level
thrombi in the LEFT lower lobe, lingular branch and upper lobe
branches. RV to LV ratio is increased to between 1.24 and
depending on level of measurement.

Marked straightening of the interventricular septum.

No pericardial effusion or signs of pericardial thickening. Aorta
with mild dilation but without aneurysmal caliber. No acute aortic
process with classic three-vessel branching pattern.

Mediastinum/Nodes: Esophagus is patulous and thickened distally up
to nearly a cm greatest thickness above the GE junction which is
short segment and or focal. No thoracic inlet adenopathy. No
axillary lymphadenopathy. No hilar or mediastinal lymphadenopathy.

Lungs/Pleura: There is no pneumothorax. No pleural effusion. No
lobar consolidation. Airways are patent.

Musculoskeletal: No acute bone finding. No destructive bone process.
Spinal degenerative changes.

Review of the MIP images confirms the above findings.

CT ABDOMEN and PELVIS FINDINGS

Hepatobiliary: Signs of periportal edema. No focal, suspicious
hepatic lesion. Motion limited assessment of structures in the upper
abdomen. No signs of biliary duct dilation. Pericholecystic
stranding without substantial gallbladder distension and with
cholelithiasis.

Pancreas: Normal, without mass, inflammation or ductal dilatation.

Spleen: Spleen is normal in size and contour.

Adrenals/Urinary Tract: Adrenal glands are normal.

Striated nephrogram bilaterally. No focal, suspicious renal lesion.
Urinary bladder under distended otherwise unremarkable.

Stomach/Bowel: Distal esophageal thickening.

No acute gastric or small bowel process. Normal appendix. Colon is
largely decompressed with signs of diverticular changes of the
sigmoid.

Vascular/Lymphatic:

Aortic atherosclerosis. No sign of aneurysm. Smooth contour of the
IVC. There is no gastrohepatic or hepatoduodenal ligament
lymphadenopathy. No retroperitoneal or mesenteric lymphadenopathy.

No pelvic sidewall lymphadenopathy.

Reproductive: Unremarkable by CT.

Other: No ascites. Generalized mild stranding about the upper
abdomen may be related to urinary tract infection. No
pneumoperitoneum.

Musculoskeletal: Severe LEFT hip degenerative changes. Degenerative
changes of the spine. No acute or destructive bone process.

Review of the MIP images confirms the above findings.
IMPRESSION: 1. Positive for acute PE with CT evidence of right heart strain
(RV/LV Ratio = between 1.28 and 1.54) consistent with at least
submassive (intermediate risk) PE. The presence of right heart
strain has been associated with an increased risk of morbidity and
mortality. Please refer to the "Code PE Focused" order set in [REDACTED].
2. Cholelithiasis, pericholecystic stranding favored to be related
to cardiogenic factors and or volume resuscitation. Could consider
ultrasound as warranted for further assessment. No wall thickening
or substantial distension.
3. CT findings of pyelonephritis most pronounced in the LEFT upper
pole.
4. Esophagus is patulous and thickened distally up to nearly a cm
greatest thickness above the GE junction which is short segment and
or focal. Findings are suspicious for neoplasm, potentially related
to severe esophagitis.
5. Severe LEFT hip degenerative changes.
6. Aortic atherosclerosis.

Findings of pulmonary emboli and esophageal thickening were
discussed with the provider caring for the patient as outlined
below.

Critical Value/emergent results were called by telephone at the time
of interpretation on [DATE] at [DATE] to provider BECKLEY
, who verbally acknowledged these results.

## 2022-01-30 IMAGING — DX DG HIP (WITH OR WITHOUT PELVIS) 2-3V*L*
3 series · 3 of 3 positions shown · non-contrast
Comparison: None Available.

CLINICAL DATA: Altered mental status.  Fall.

EXAM:
DG HIP (WITH OR WITHOUT PELVIS) 2-3V LEFT

[pelvis ap]
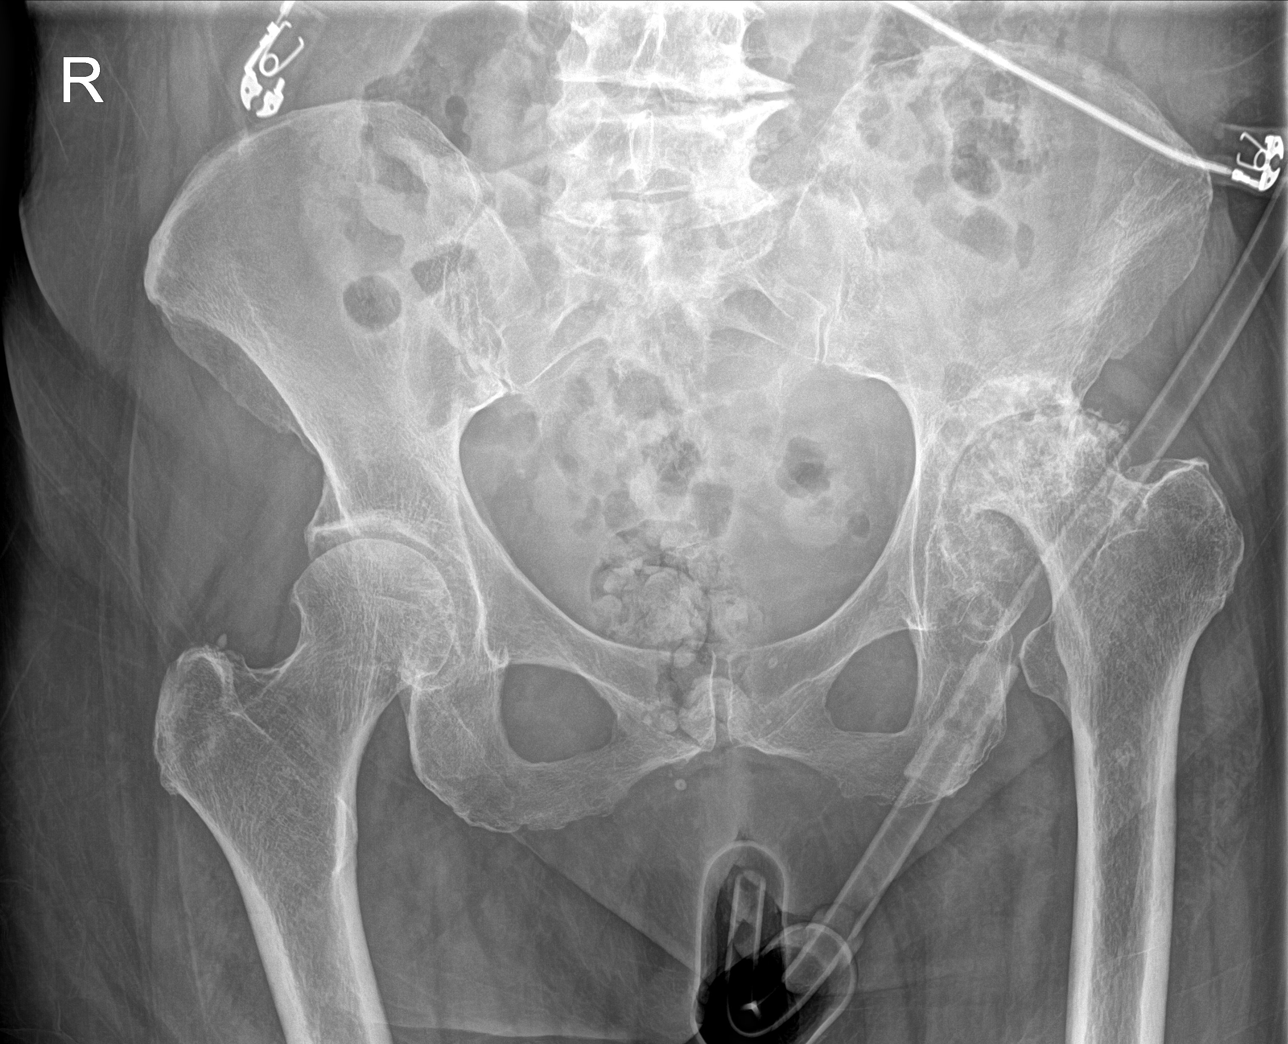

[hip ap]
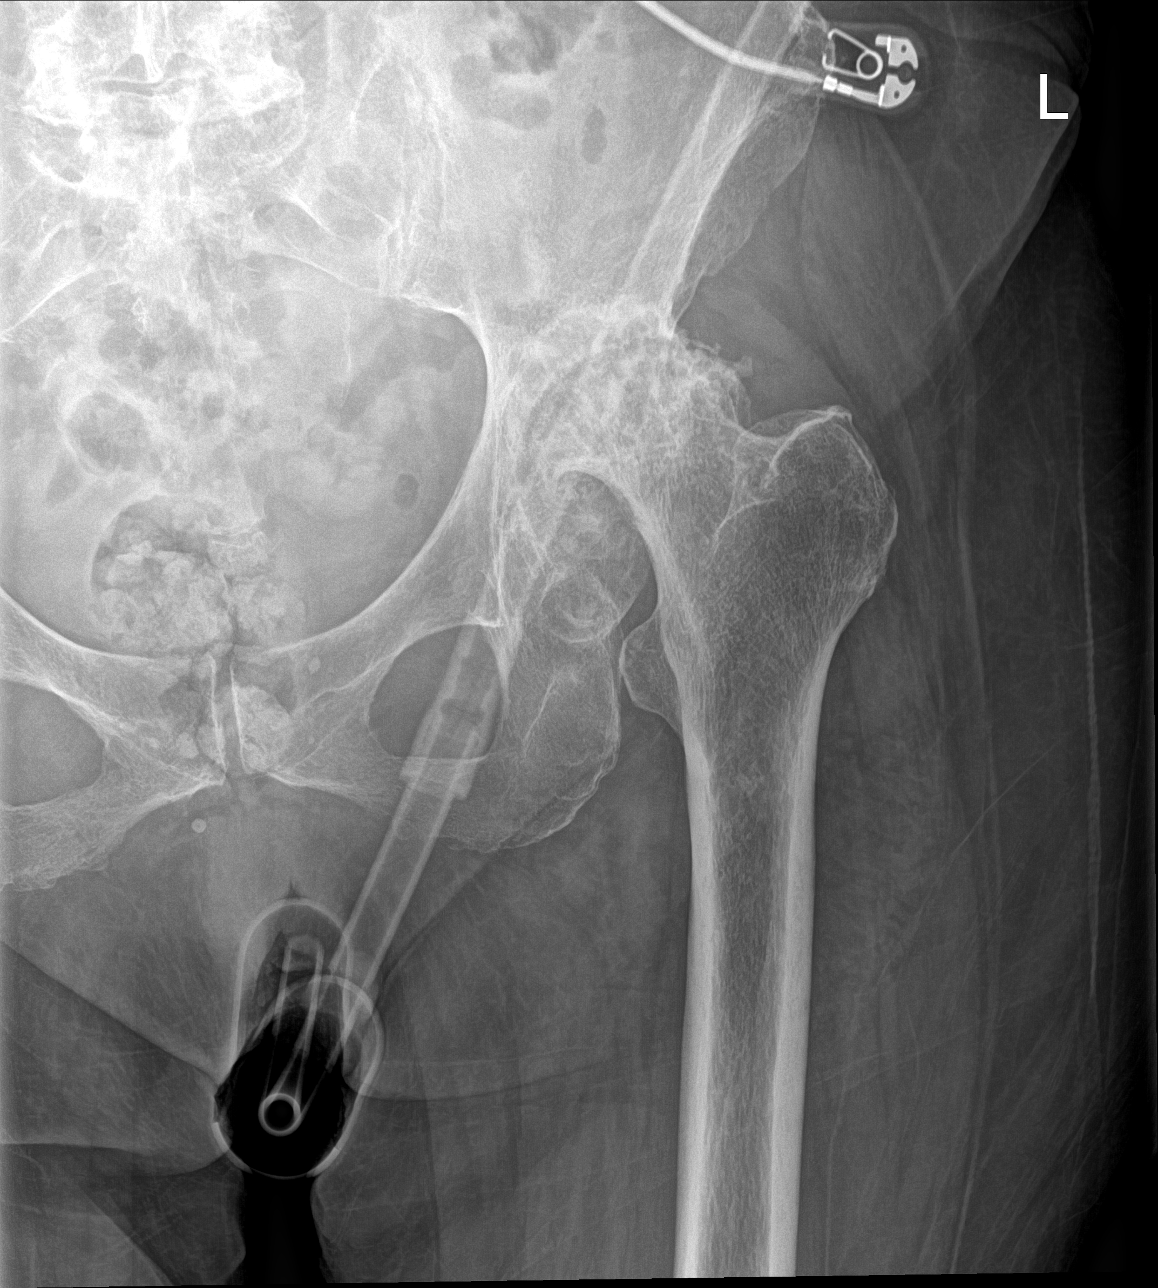

[hip frog leg]
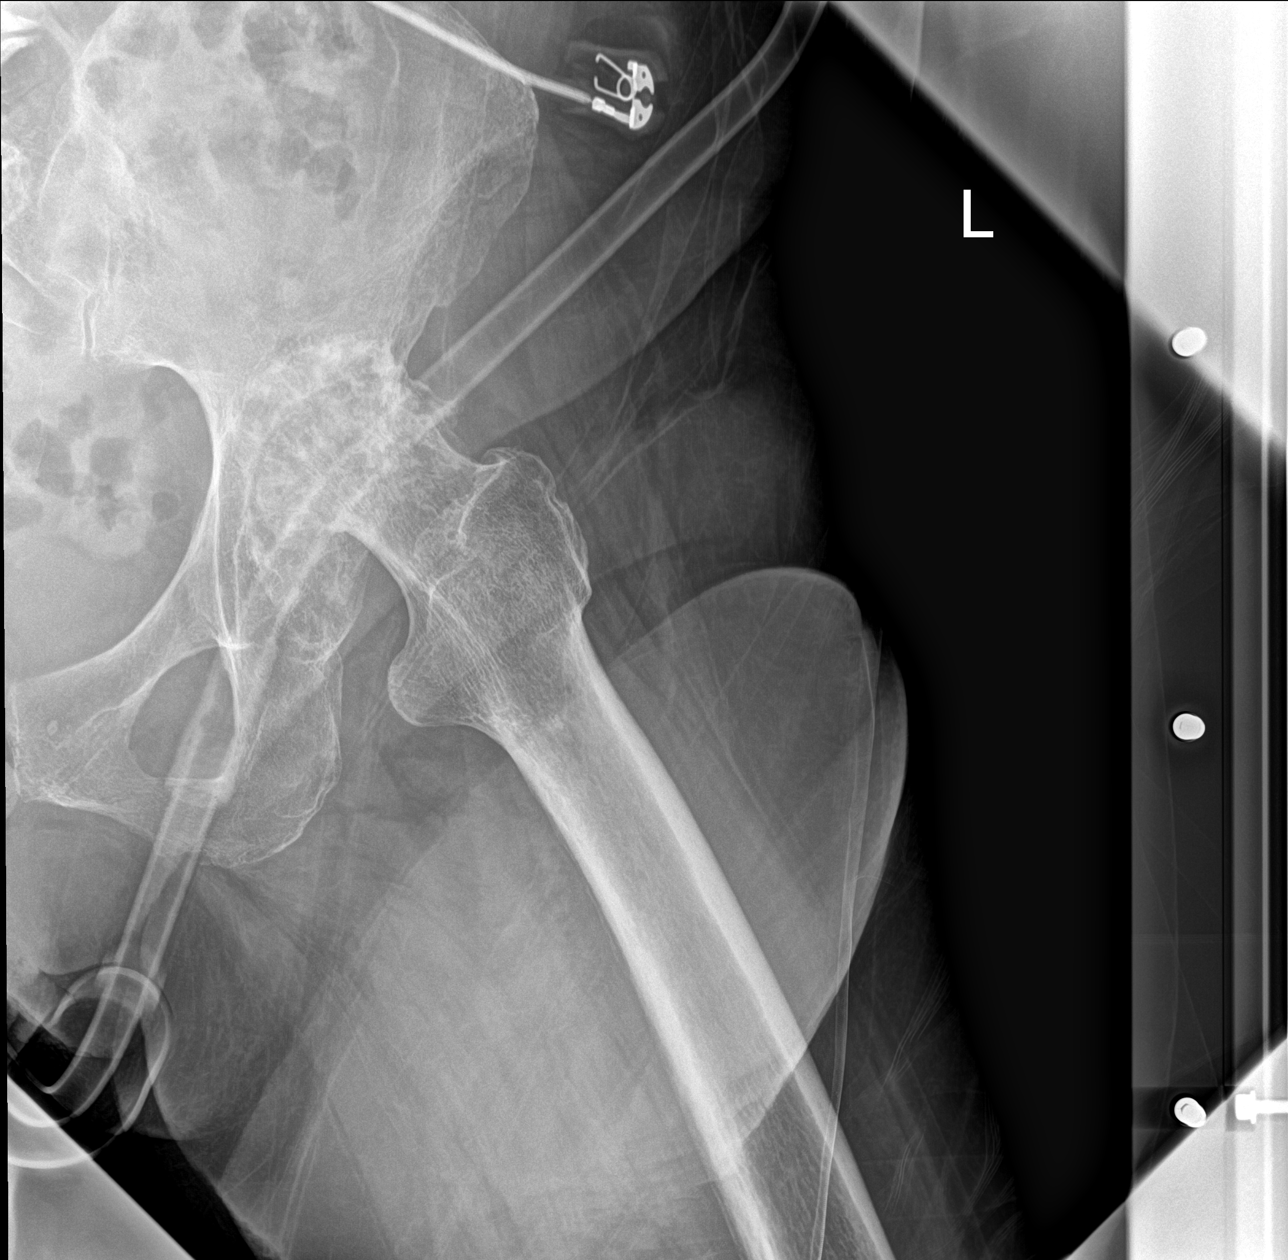

[3 of 3 positions shown; findings below may reference images not displayed]

FINDINGS: There is diffuse decreased bone mineralization. Severe left
femoroacetabular osteoarthritis including superior bone-on-bone
contact, extensive subchondral sclerosis and cystic change, and
large peripheral osteophytes with moderate to high-grade superior
femoral head and acetabular cortical flattening/bone
loss/remodeling.

Mild right femoroacetabular joint space narrowing and peripheral
osteophytosis.

The bilateral sacroiliac and pubic symphysis joint spaces are
maintained. No definite acute fracture is seen. No dislocation.

Moderate to severe left L4-5 disc space narrowing and peripheral
degenerative osteophytosis.
IMPRESSION: Severe left femoroacetabular osteoarthritis. No definite acute
fracture.

## 2022-01-30 IMAGING — CT CT ANGIO CHEST
2 of 6 series · 16 of 46 positions shown · IV contrast (Omnipaque or Isovue)
Comparison: Abdomen evaluation of the same date.

CLINICAL DATA: Altered mental status and sepsis.

* Tracking Code: BO *
EXAM:
CT ANGIOGRAPHY CHEST
CT ABDOMEN AND PELVIS WITH CONTRAST
TECHNIQUE: Multidetector CT imaging of the chest was performed using the
standard protocol during bolus administration of intravenous
contrast. Multiplanar CT image reconstructions and MIPs were
obtained to evaluate the vascular anatomy. Multidetector CT imaging
of the abdomen and pelvis was performed using the standard protocol
during bolus administration of intravenous contrast.

[Series 4: pe lungs · axial · 0.78mm/px · z∈[-427,-139]mm · 13 of 164 slices shown]
[im 10/164  lung]
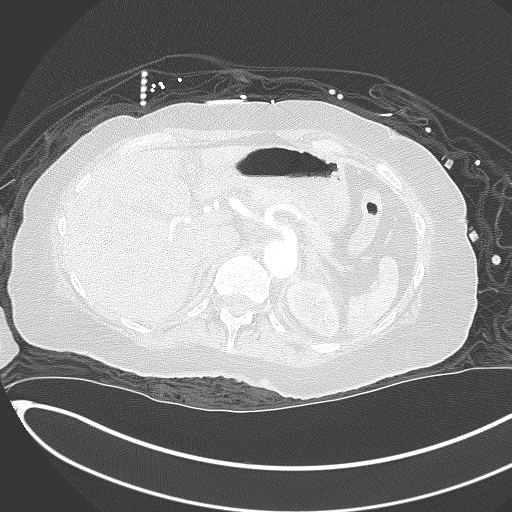
[im 19/164  soft-tissue]
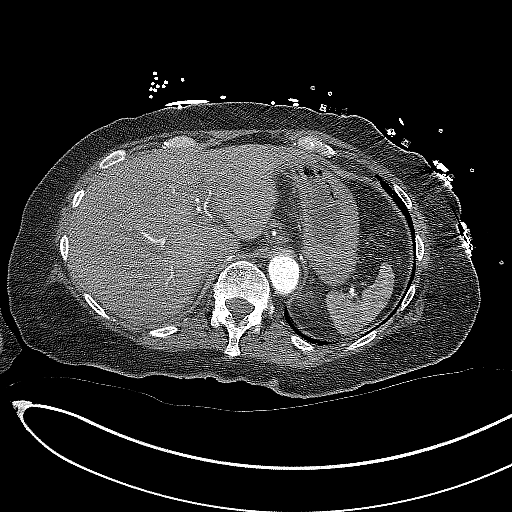
[im 37/164  lung]
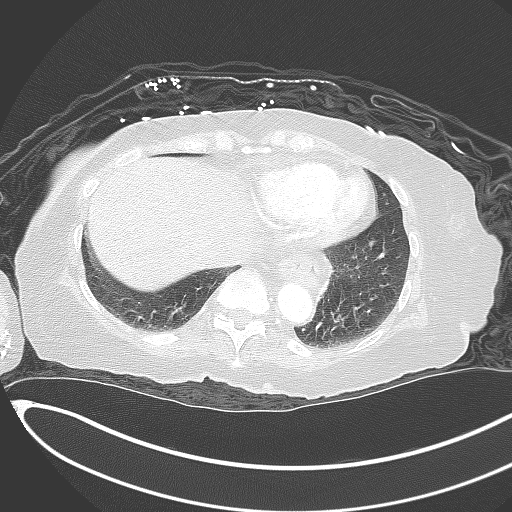
[im 46/164  soft-tissue]
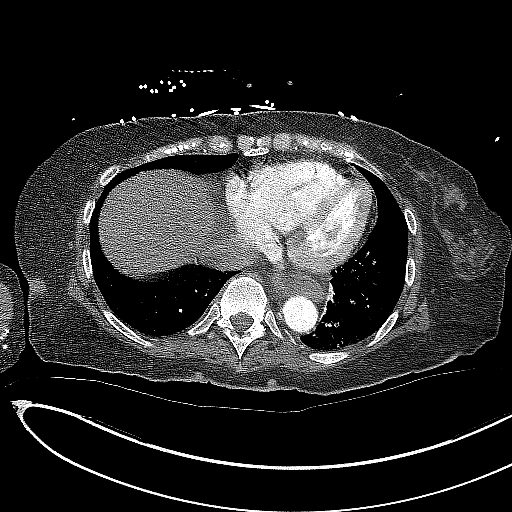
[im 55/164  lung]
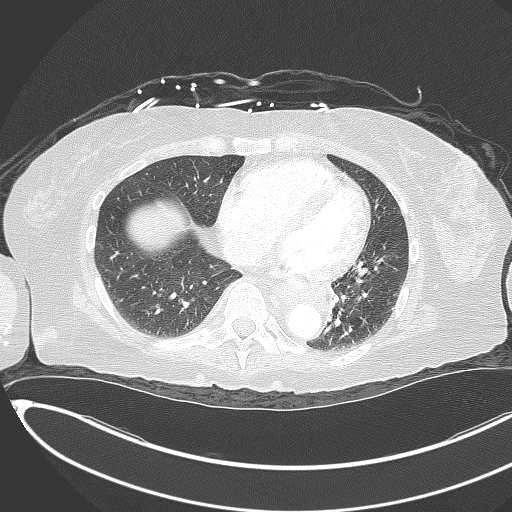
[im 73/164  soft-tissue]
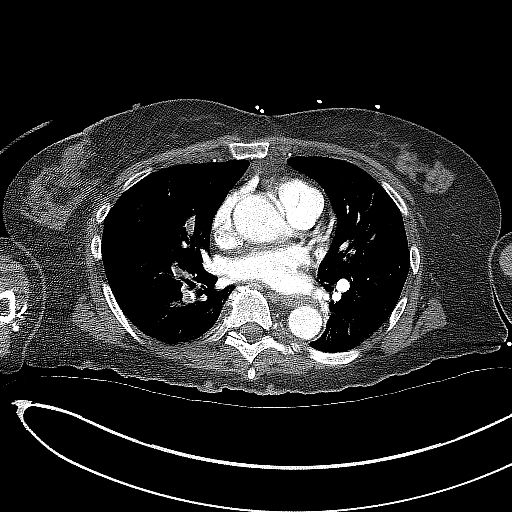
[im 82/164  lung]
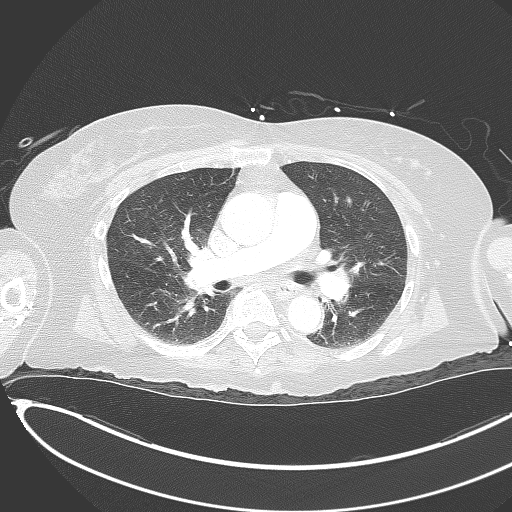
[im 91/164  soft-tissue]
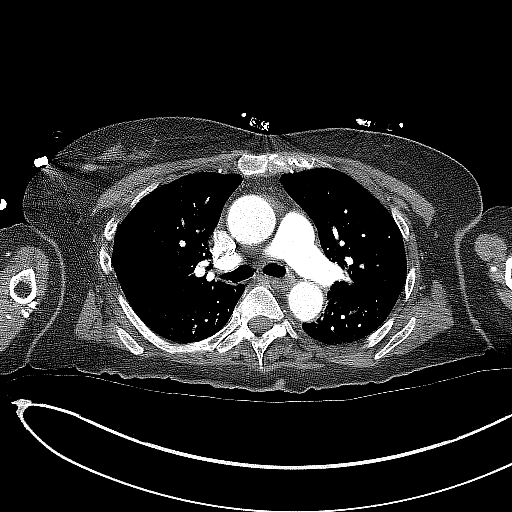
[im 109/164  lung]
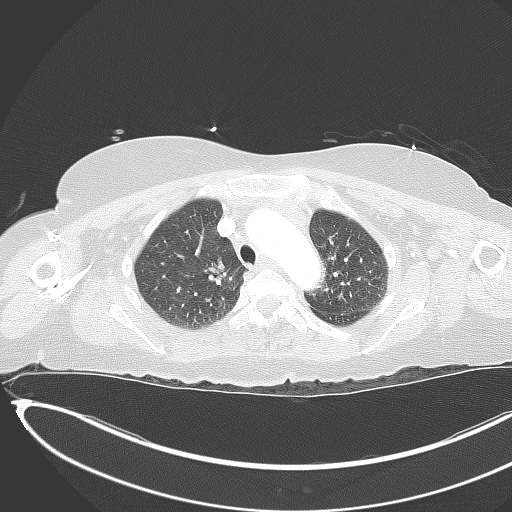
[im 118/164  soft-tissue]
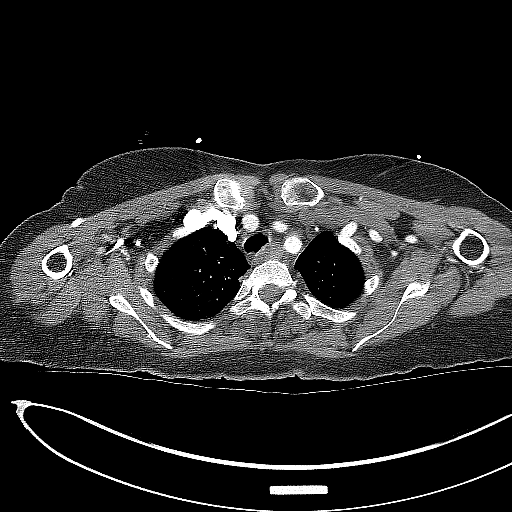
[im 127/164  lung]
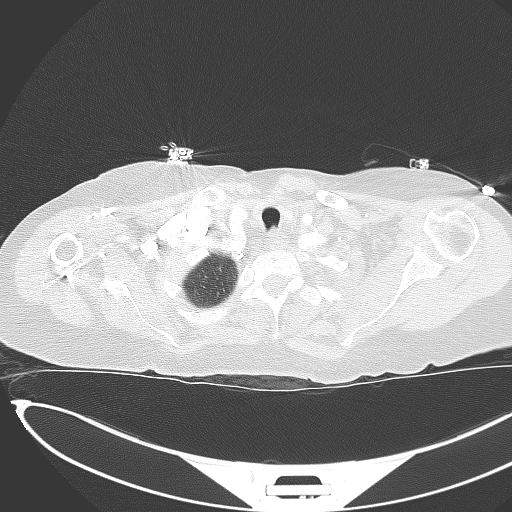
[im 145/164  soft-tissue]
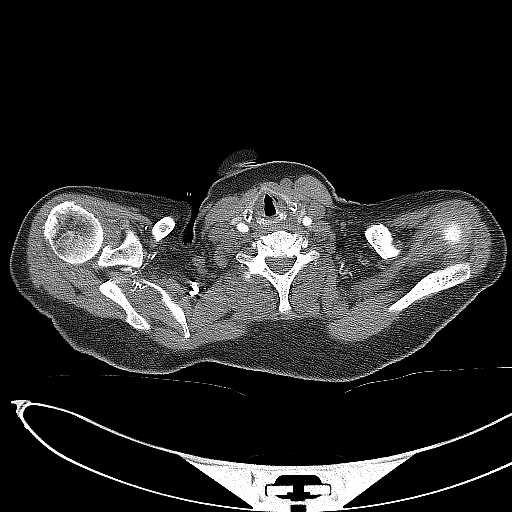
[im 154/164  lung]
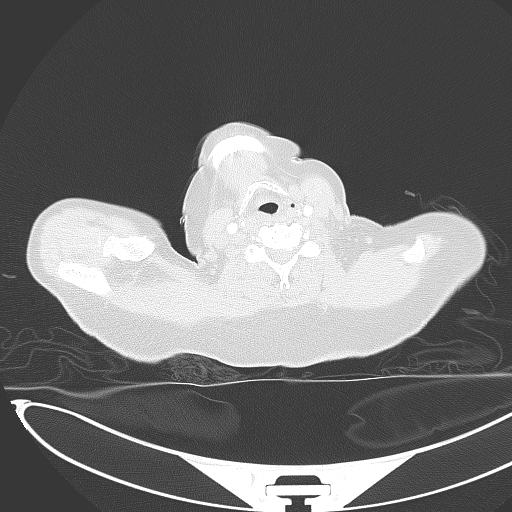

[Series 5: cor soft · coronal · 0.67mm/px · 3 of 119 slices shown]
[im 30/119  soft-tissue]
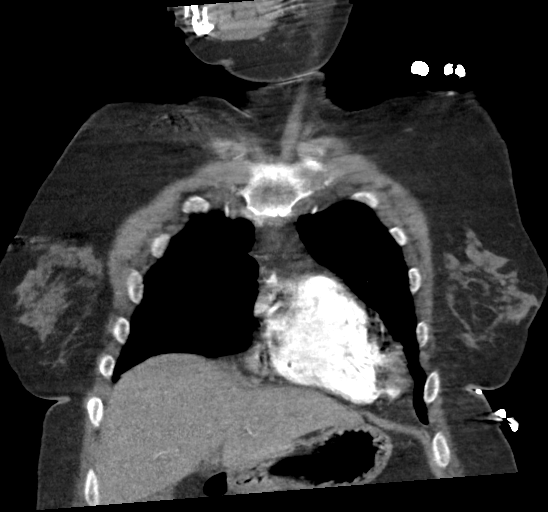
[im 60/119  soft-tissue]
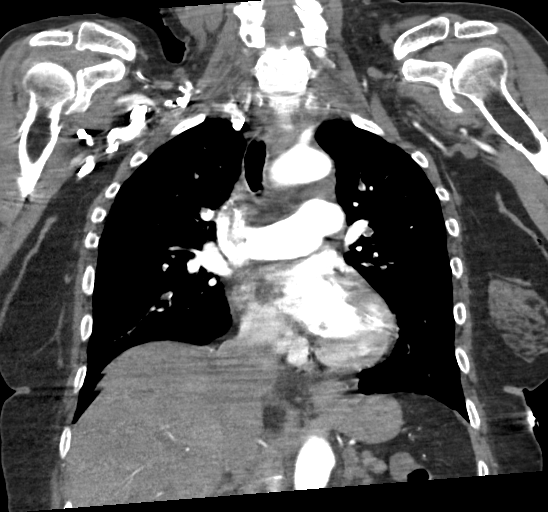
[im 89/119  soft-tissue]
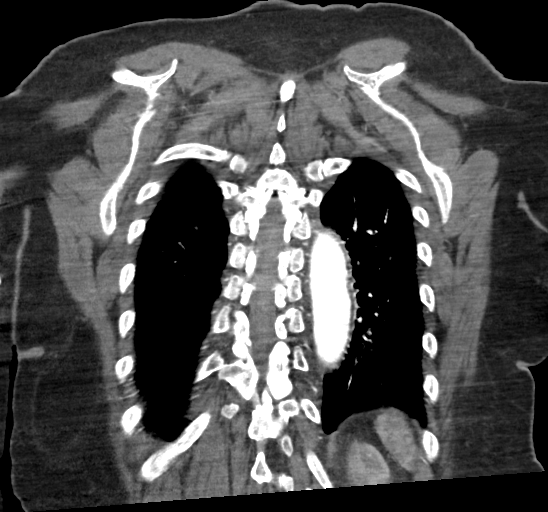

[16 of 46 positions shown; findings below may reference images not displayed]

RADIATION DOSE REDUCTION: This exam was performed according to the
departmental dose-optimization program which includes automated
exposure control, adjustment of the mA and/or kV according to
patient size and/or use of iterative reconstruction technique.

CONTRAST:  100mL OMNIPAQUE IOHEXOL 350 MG/ML SOLN
FINDINGS: CTA CHEST FINDINGS

Cardiovascular: There is thrombus in the RIGHT lower lobe artery
with extension into RIGHT middle lobe branches, at the segmental
level. There is also thrombus in RIGHT lower lobe branches at the
central segmental level. Near occlusive thrombus to posterior RIGHT
lower lobe branches.

Central segmental involvement of all upper lobe branches on the
RIGHT as well.

Similar pattern of thrombosis in the LEFT chest also with lower lobe
level, lobar level embolus perhaps bridging into upper lobe branches
and filling lower lobe branches as well, lobar and segmental level
emboli throughout the LEFT chest. There are multiple segmental level
thrombi in the LEFT lower lobe, lingular branch and upper lobe
branches. RV to LV ratio is increased to between 1.24 and
depending on level of measurement.

Marked straightening of the interventricular septum.

No pericardial effusion or signs of pericardial thickening. Aorta
with mild dilation but without aneurysmal caliber. No acute aortic
process with classic three-vessel branching pattern.

Mediastinum/Nodes: Esophagus is patulous and thickened distally up
to nearly a cm greatest thickness above the GE junction which is
short segment and or focal. No thoracic inlet adenopathy. No
axillary lymphadenopathy. No hilar or mediastinal lymphadenopathy.

Lungs/Pleura: There is no pneumothorax. No pleural effusion. No
lobar consolidation. Airways are patent.

Musculoskeletal: No acute bone finding. No destructive bone process.
Spinal degenerative changes.

Review of the MIP images confirms the above findings.

CT ABDOMEN and PELVIS FINDINGS

Hepatobiliary: Signs of periportal edema. No focal, suspicious
hepatic lesion. Motion limited assessment of structures in the upper
abdomen. No signs of biliary duct dilation. Pericholecystic
stranding without substantial gallbladder distension and with
cholelithiasis.

Pancreas: Normal, without mass, inflammation or ductal dilatation.

Spleen: Spleen is normal in size and contour.

Adrenals/Urinary Tract: Adrenal glands are normal.

Striated nephrogram bilaterally. No focal, suspicious renal lesion.
Urinary bladder under distended otherwise unremarkable.

Stomach/Bowel: Distal esophageal thickening.

No acute gastric or small bowel process. Normal appendix. Colon is
largely decompressed with signs of diverticular changes of the
sigmoid.

Vascular/Lymphatic:

Aortic atherosclerosis. No sign of aneurysm. Smooth contour of the
IVC. There is no gastrohepatic or hepatoduodenal ligament
lymphadenopathy. No retroperitoneal or mesenteric lymphadenopathy.

No pelvic sidewall lymphadenopathy.

Reproductive: Unremarkable by CT.

Other: No ascites. Generalized mild stranding about the upper
abdomen may be related to urinary tract infection. No
pneumoperitoneum.

Musculoskeletal: Severe LEFT hip degenerative changes. Degenerative
changes of the spine. No acute or destructive bone process.

Review of the MIP images confirms the above findings.
IMPRESSION: 1. Positive for acute PE with CT evidence of right heart strain
(RV/LV Ratio = between 1.28 and 1.54) consistent with at least
submassive (intermediate risk) PE. The presence of right heart
strain has been associated with an increased risk of morbidity and
mortality. Please refer to the "Code PE Focused" order set in [REDACTED].
2. Cholelithiasis, pericholecystic stranding favored to be related
to cardiogenic factors and or volume resuscitation. Could consider
ultrasound as warranted for further assessment. No wall thickening
or substantial distension.
3. CT findings of pyelonephritis most pronounced in the LEFT upper
pole.
4. Esophagus is patulous and thickened distally up to nearly a cm
greatest thickness above the GE junction which is short segment and
or focal. Findings are suspicious for neoplasm, potentially related
to severe esophagitis.
5. Severe LEFT hip degenerative changes.
6. Aortic atherosclerosis.

Findings of pulmonary emboli and esophageal thickening were
discussed with the provider caring for the patient as outlined
below.

Critical Value/emergent results were called by telephone at the time
of interpretation on [DATE] at [DATE] to provider BECKLEY
, who verbally acknowledged these results.

## 2022-01-30 IMAGING — DX DG LUMBAR SPINE COMPLETE 4+V
5 series · 5 of 5 positions shown · non-contrast
Comparison: None available

CLINICAL DATA: Fall.  Pain.

EXAM:
LUMBAR SPINE - COMPLETE 4+ VIEW

[l-spine ap]
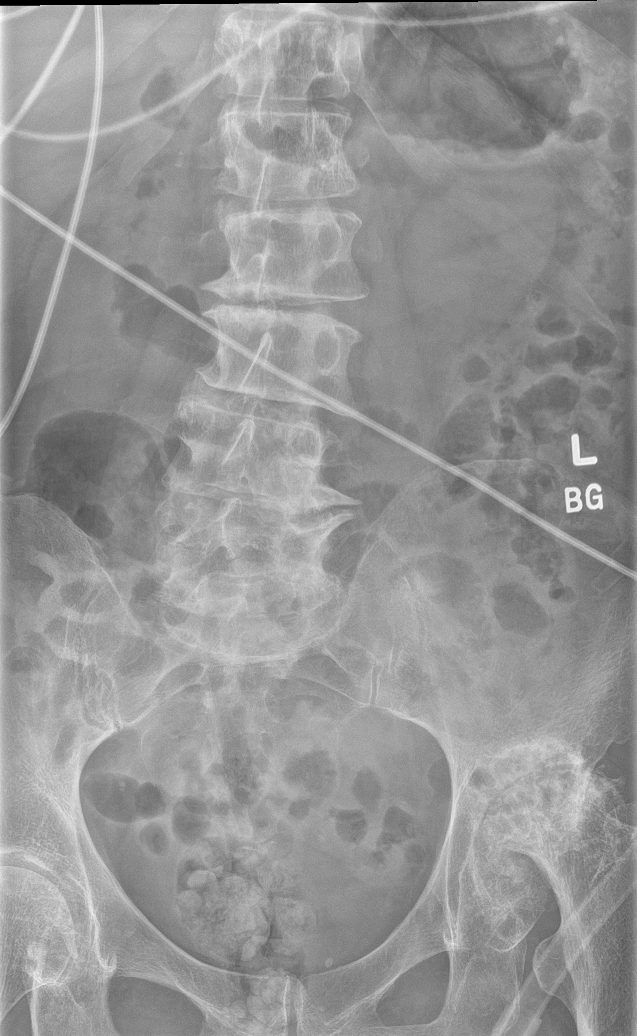

[l-spine obl (1 of 2)]
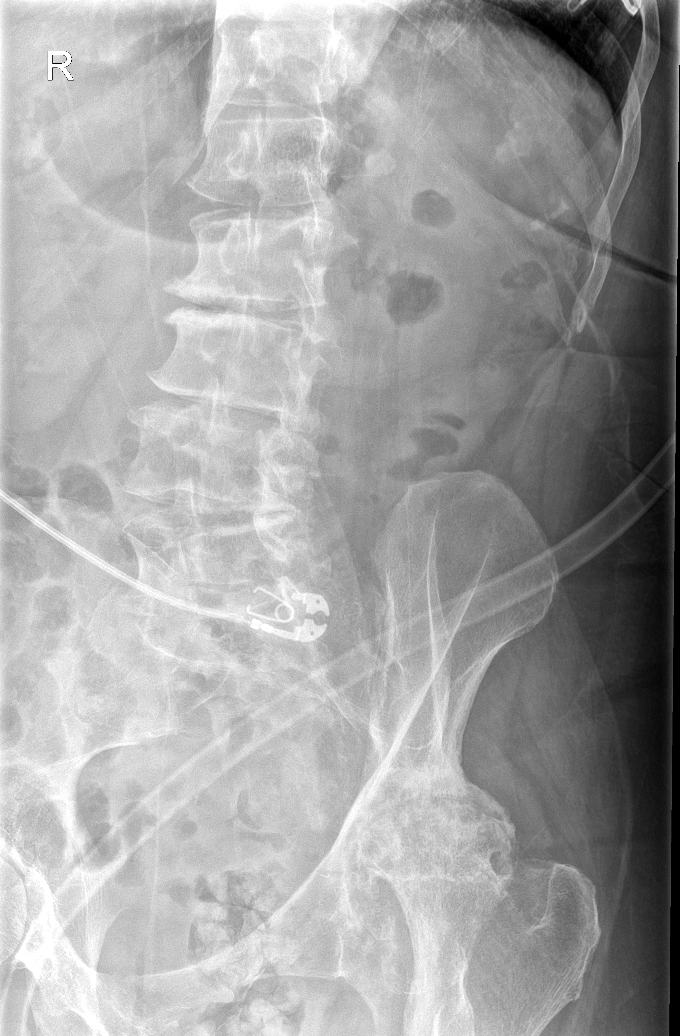

[l-spine obl (2 of 2)]
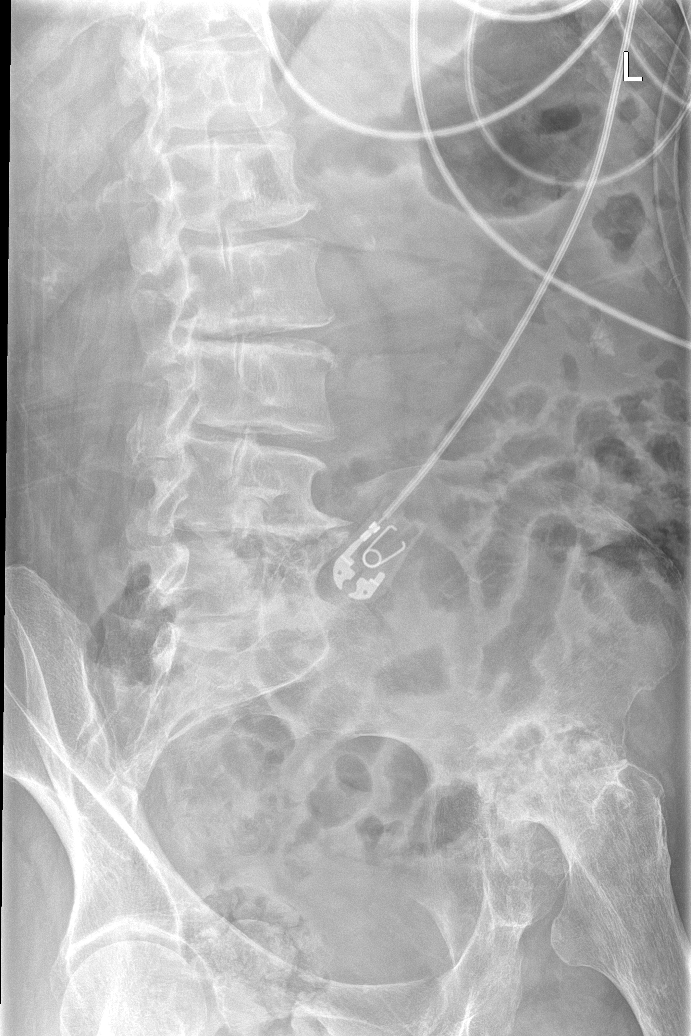

[l-spine lat]
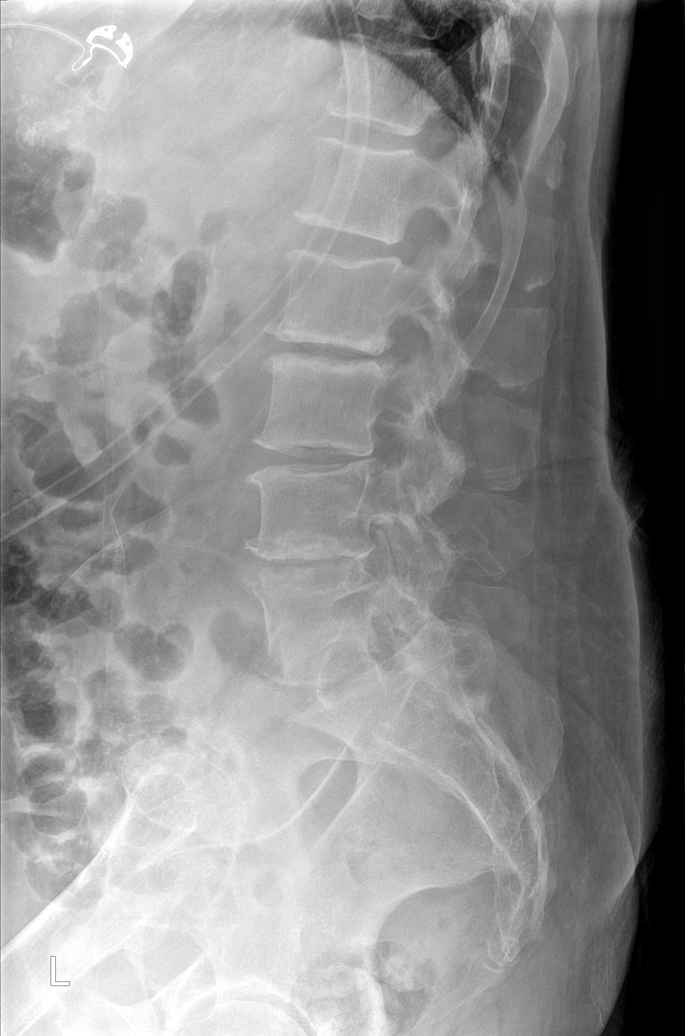

[l-spine spot]
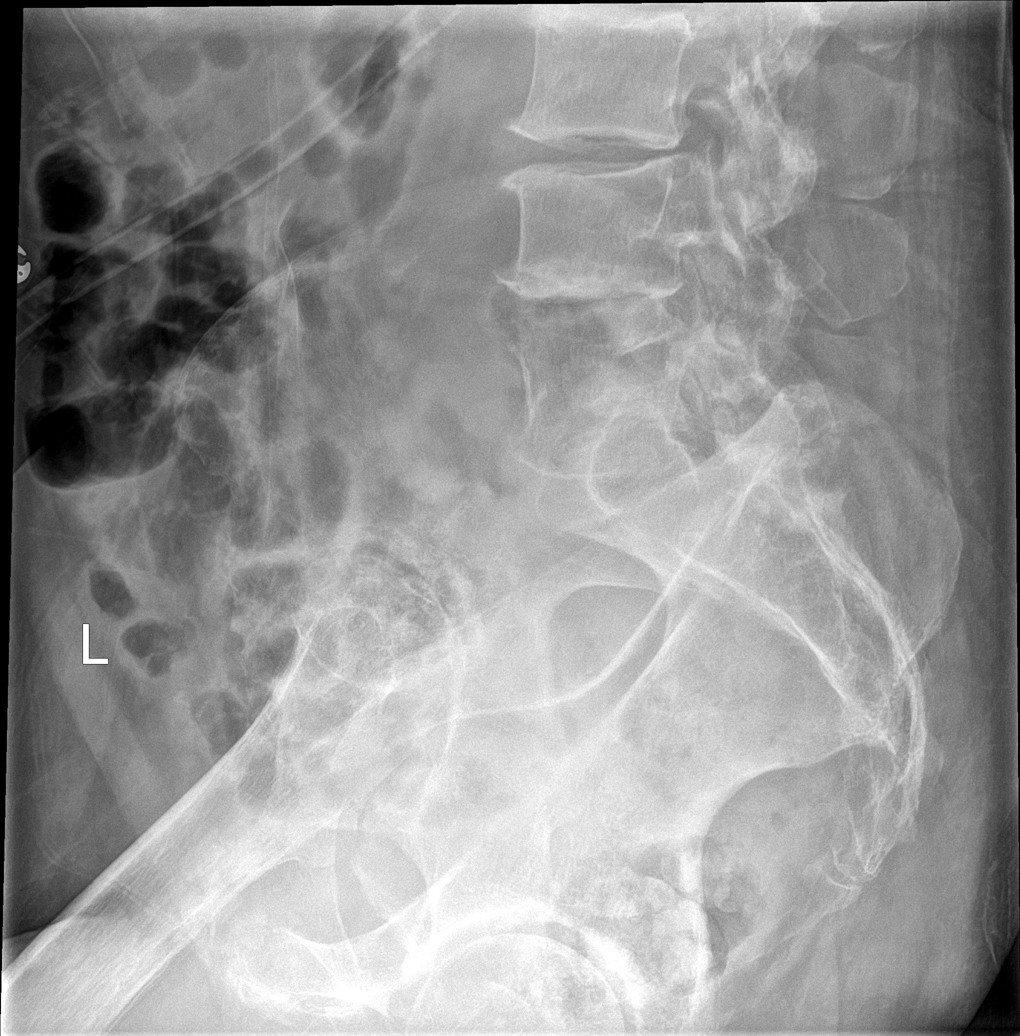

[5 of 5 positions shown; findings below may reference images not displayed]

FINDINGS: There are 5 non-rib-bearing lumbar-type vertebral bodies. Minimal
levocurvature centered at L2-3 with moderate right L2-3 disc space
narrowing, endplate sclerosis, vacuum phenomenon, and peripheral
osteophytosis. Mild right L3-4 and severe left L4-5 disc space
narrowing. Large left lateral L4-5 endplate osteophytes.

No sagittal spondylolisthesis. Vertebral body heights are
maintained.

Severe left femoroacetabular osteoarthritis with moderate to
high-grade superior femoral head bone loss.
IMPRESSION: Moderate multilevel degenerative disc and endplate changes greatest
at the left L4-5 diffuse L5-S1 and right L2-3 levels.

## 2022-01-30 MED ORDER — SODIUM CHLORIDE 0.9 % IV SOLN
2.0000 g | Freq: Once | INTRAVENOUS | Status: AC
  Administered 2022-01-30: 2 g via INTRAVENOUS
  Filled 2022-01-30: qty 10

## 2022-01-30 MED ORDER — HEPARIN (PORCINE) 25000 UT/250ML-% IV SOLN
1600.0000 [IU]/h | INTRAVENOUS | Status: DC
  Administered 2022-01-30: 1600 [IU]/h via INTRAVENOUS
  Filled 2022-01-30: qty 250

## 2022-01-30 MED ORDER — SODIUM CHLORIDE 0.9 % IV SOLN
250.0000 mL | INTRAVENOUS | Status: DC
  Administered 2022-01-30 – 2022-02-02 (×3): 250 mL via INTRAVENOUS

## 2022-01-30 MED ORDER — DOCUSATE SODIUM 100 MG PO CAPS: 100.0000 mg | ORAL_CAPSULE | Freq: Two times a day (BID) | ORAL | Status: DC | PRN

## 2022-01-30 MED ORDER — LACTATED RINGERS IV BOLUS
1000.0000 mL | Freq: Once | INTRAVENOUS | Status: AC
  Administered 2022-01-30: 1000 mL via INTRAVENOUS

## 2022-01-30 MED ORDER — NOREPINEPHRINE 4 MG/250ML-% IV SOLN: 2.0000 ug/min | INTRAVENOUS | Status: DC

## 2022-01-30 MED ORDER — PANTOPRAZOLE SODIUM 40 MG PO TBEC
40.0000 mg | DELAYED_RELEASE_TABLET | Freq: Every day | ORAL | Status: DC
  Administered 2022-01-31 – 2022-02-01 (×2): 40 mg via ORAL
  Filled 2022-01-30 (×2): qty 1

## 2022-01-30 MED ORDER — LACTATED RINGERS IV SOLN
INTRAVENOUS | Status: DC
  Administered 2022-01-30: 1000 mL via INTRAVENOUS

## 2022-01-30 MED ORDER — DEXAMETHASONE SODIUM PHOSPHATE 10 MG/ML IJ SOLN
10.0000 mg | Freq: Four times a day (QID) | INTRAMUSCULAR | Status: DC
  Administered 2022-01-30 – 2022-02-03 (×16): 10 mg via INTRAVENOUS
  Filled 2022-01-30 (×18): qty 1

## 2022-01-30 MED ORDER — IOHEXOL 350 MG/ML SOLN
100.0000 mL | Freq: Once | INTRAVENOUS | Status: AC | PRN
  Administered 2022-01-30: 100 mL via INTRAVENOUS

## 2022-01-30 MED ORDER — CHLORHEXIDINE GLUCONATE CLOTH 2 % EX PADS
6.0000 | MEDICATED_PAD | Freq: Every day | CUTANEOUS | Status: DC
Start: 2022-01-31 — End: 2022-02-04
  Administered 2022-01-31 – 2022-02-04 (×5): 6 via TOPICAL

## 2022-01-30 MED ORDER — VANCOMYCIN HCL IN DEXTROSE 1-5 GM/200ML-% IV SOLN: 1000.0000 mg | Freq: Once | INTRAVENOUS | Status: DC

## 2022-01-30 MED ORDER — POTASSIUM CHLORIDE 20 MEQ PO PACK
40.0000 meq | PACK | Freq: Once | ORAL | Status: AC
Start: 2022-01-30 — End: 2022-01-30
  Administered 2022-01-30: 40 meq via ORAL
  Filled 2022-01-30: qty 2

## 2022-01-30 MED ORDER — VANCOMYCIN HCL 2000 MG/400ML IV SOLN
2000.0000 mg | Freq: Once | INTRAVENOUS | Status: AC
Start: 2022-01-30 — End: 2022-01-30
  Administered 2022-01-30: 2000 mg via INTRAVENOUS
  Filled 2022-01-30: qty 400

## 2022-01-30 MED ORDER — POTASSIUM CHLORIDE 10 MEQ/100ML IV SOLN
10.0000 meq | INTRAVENOUS | Status: AC
  Administered 2022-01-30 – 2022-01-31 (×4): 10 meq via INTRAVENOUS
  Filled 2022-01-30 (×3): qty 100

## 2022-01-30 MED ORDER — HEPARIN (PORCINE) 25000 UT/250ML-% IV SOLN
1100.0000 [IU]/h | INTRAVENOUS | Status: DC
  Administered 2022-01-31 – 2022-02-02 (×3): 1100 [IU]/h via INTRAVENOUS
  Filled 2022-01-30 (×4): qty 250

## 2022-01-30 MED ORDER — METRONIDAZOLE 500 MG/100ML IV SOLN
500.0000 mg | Freq: Once | INTRAVENOUS | Status: AC
  Administered 2022-01-30: 500 mg via INTRAVENOUS
  Filled 2022-01-30: qty 100

## 2022-01-30 MED ORDER — DEXAMETHASONE SODIUM PHOSPHATE 4 MG/ML IJ SOLN
4.0000 mg | Freq: Once | INTRAMUSCULAR | Status: AC
  Administered 2022-01-30: 4 mg via INTRAVENOUS
  Filled 2022-01-30: qty 1

## 2022-01-30 MED ORDER — MAGNESIUM SULFATE 2 GM/50ML IV SOLN
2.0000 g | Freq: Once | INTRAVENOUS | Status: AC
  Administered 2022-01-30: 2 g via INTRAVENOUS
  Filled 2022-01-30: qty 50

## 2022-01-30 MED ORDER — HEPARIN BOLUS VIA INFUSION
4000.0000 [IU] | Freq: Once | INTRAVENOUS | Status: AC
  Administered 2022-01-30: 4000 [IU] via INTRAVENOUS

## 2022-01-30 MED ORDER — ORAL CARE MOUTH RINSE
15.0000 mL | Freq: Two times a day (BID) | OROMUCOSAL | Status: DC
  Administered 2022-01-30 – 2022-02-02 (×3): 15 mL via OROMUCOSAL

## 2022-01-30 MED ORDER — INSULIN ASPART 100 UNIT/ML IJ SOLN
0.0000 [IU] | INTRAMUSCULAR | Status: DC
  Administered 2022-01-31: 2 [IU] via SUBCUTANEOUS
  Administered 2022-01-31: 1 [IU] via SUBCUTANEOUS
  Administered 2022-01-31: 2 [IU] via SUBCUTANEOUS
  Administered 2022-01-31: 3 [IU] via SUBCUTANEOUS
  Administered 2022-01-31: 1 [IU] via SUBCUTANEOUS
  Administered 2022-01-31: 2 [IU] via SUBCUTANEOUS
  Administered 2022-01-31: 1 [IU] via SUBCUTANEOUS
  Administered 2022-02-01 (×2): 2 [IU] via SUBCUTANEOUS
  Administered 2022-02-01: 1 [IU] via SUBCUTANEOUS
  Administered 2022-02-01 (×2): 2 [IU] via SUBCUTANEOUS
  Administered 2022-02-02: 3 [IU] via SUBCUTANEOUS
  Administered 2022-02-02: 1 [IU] via SUBCUTANEOUS
  Administered 2022-02-02: 3 [IU] via SUBCUTANEOUS

## 2022-01-30 MED ORDER — SODIUM CHLORIDE 0.9 % IV SOLN
2.0000 g | Freq: Three times a day (TID) | INTRAVENOUS | Status: DC
  Administered 2022-01-30 – 2022-02-01 (×5): 2 g via INTRAVENOUS
  Filled 2022-01-30 (×6): qty 10

## 2022-01-30 MED ORDER — ACETAMINOPHEN 325 MG PO TABS
650.0000 mg | ORAL_TABLET | ORAL | Status: DC | PRN
  Administered 2022-01-31 – 2022-03-28 (×43): 650 mg via ORAL
  Filled 2022-01-30 (×44): qty 2

## 2022-01-30 MED ORDER — POLYETHYLENE GLYCOL 3350 17 G PO PACK: 17.0000 g | PACK | Freq: Every day | ORAL | Status: DC | PRN

## 2022-01-30 MED ORDER — VANCOMYCIN HCL 1500 MG/300ML IV SOLN
1500.0000 mg | Freq: Two times a day (BID) | INTRAVENOUS | Status: DC
  Filled 2022-01-30: qty 300

## 2022-01-30 MED ORDER — SODIUM CHLORIDE 0.9% IV SOLUTION
Freq: Once | INTRAVENOUS | Status: AC
Start: 2022-01-31 — End: 2022-01-31

## 2022-01-30 NOTE — Progress Notes (Signed)
ANTICOAGULATION CONSULT NOTE Pharmacy Consult for heparin Indication: pulmonary embolus Brief A/P: Heparin level supratherapeutic Hold heparin and decrease rate  Allergies  Allergen Reactions   Penicillins Anaphylaxis    Patient Measurements: Height: '5\' 5"'$  (165.1 cm) Weight: 69.5 kg (153 lb 3.5 oz) IBW/kg (Calculated) : 57 H   Vital Signs: Temp: 97.6 F (36.4 C) (06/02 2349) Temp Source: Oral (06/02 2349) BP: 134/79 (06/02 2330) Pulse Rate: 92 (06/02 2330)  Labs: Recent Labs    01/30/22 1206 01/30/22 1211 01/30/22 2315  HGB 10.2* 7.9* 6.8*  HCT 30.0* 27.4* 23.9*  PLT  --  578* 447*  LABPROT  --  16.1*  --   INR  --  1.3*  --   CREATININE 0.90 0.95  --   CKTOTAL  --  1,651*  --      Estimated Creatinine Clearance: 57.8 mL/min (by C-G formula based on SCr of 0.95 mg/dL).  Assessment: 66 y.o. female with PE for heparin.   Initial heparin dosing based on weight previously recorded as 165 kg, now corrected to 153 lb.    Goal of Therapy:  Heparin level 0.3-0.7 units/ml Monitor platelets by anticoagulation protocol: Yes   Plan:  Hold heparin x 90 min, then decrease heparin 1100 units/hr Check heparin level in 8 hours.   Caryl Pina 01/30/2022,11:51 PM

## 2022-01-30 NOTE — Progress Notes (Signed)
An USGPIV (ultrasound guided PIV) has been placed for short-term vasopressor infusion. A correctly placed ivWatch must be used when administering Vasopressors. Should this treatment be needed beyond 72 hours, central line access should be obtained.  It will be the responsibility of the bedside nurse to follow best practice to prevent extravasations.   ?

## 2022-01-30 NOTE — ED Provider Notes (Signed)
[facility] [place] Provider Note   CSN: [id number] Arrival date & time: 01/30/22  1139  LEVEL 5 CAVEAT - ALTERED MENTAL STATUS   History  Chief Complaint  Patient presents with   Hypotension    Jennifer Pacheco is a 66 y.o. female.  HPI 66 year old female with a history of hypertension and diabetes presents with altered mental status.  EMS is no longer present when I am seeing the patient and the patient is confused and unable to provide a full history or at least a reliable history.  She tells me that she fell and injured her left buttocks and has been unable to get up since.  She tells me is a week ago though it is hard to know how long exactly.  Someone checked on her today and she was covered in feces and urine and was on the floor.  Is unclear how long she was down.  Patient denies a headache.  She is having pain in her left buttocks and is hard to lift her leg because of this pain.  She was hypoxic and hypotensive for EMS.  Home Medications Prior to Admission medications   Medication Sig Start Date End Date Taking? Authorizing Provider  cyclobenzaprine (FLEXERIL) 10 MG tablet Take by mouth.    [provider]  lisinopril (ZESTRIL) 20 MG tablet Take 20 mg by mouth daily. 04/15/20   [provider]  meloxicam (MOBIC) 15 MG tablet Take by mouth.    [provider]  metFORMIN (GLUCOPHAGE) 500 MG tablet Take by mouth.    [provider]  Omega-3 1000 MG CAPS Take by mouth.    [provider]      Allergies    Penicillins    Review of Systems   Review of Systems  Unable to perform ROS: Mental status change   Physical Exam Updated Vital Signs BP (!) 93/57   Pulse (!) 108   Temp 98.5 F (36.9 C) (Rectal)   Resp (!) 25   Ht '5\' 5"'$  (1.651 m)   Wt (!) 165 kg   SpO2 94%   BMI 60.53 kg/m  Physical Exam Vitals and nursing note reviewed.  Constitutional:      Appearance: She is well-developed. She is  ill-appearing.  HENT:     Head: Normocephalic and atraumatic.     Mouth/Throat:     Mouth: Mucous membranes are dry.  Eyes:     Pupils: Pupils are equal, round, and reactive to light.  Cardiovascular:     Rate and Rhythm: Regular rhythm. Tachycardia present.     Heart sounds: Normal heart sounds.  Pulmonary:     Effort: Pulmonary effort is normal.     Breath sounds: Normal breath sounds.  Abdominal:     Palpations: Abdomen is soft.     Tenderness: There is no abdominal tenderness.  Musculoskeletal:     Cervical back: No rigidity.     Left hip: Tenderness (posterior hip/buttocks) present. Normal range of motion.  Skin:    General: Skin is warm and dry.       Neurological:     Mental Status: She is alert. She is disoriented.     Comments: Patient has equal strength in all 4 extremities.  She is hesitant to lift her left lower extremity due to pain. When she does, her thigh can lift off stretcher but her lower leg doesn't seem as strong and is harder to lift off stretcher. Patient knows she is in the  hospital but is not sure where.  She knows it is 2023 but thinks it is March and Monday.    ED Results / Procedures / Treatments   Labs (all labs ordered are listed, but only abnormal results are displayed) Labs Reviewed  CBC WITH DIFFERENTIAL/PLATELET - Abnormal; Notable for the following components:      Result Value   WBC 23.7 (*)    Hemoglobin 7.9 (*)    HCT 27.4 (*)    MCV 66.5 (*)    MCH 19.2 (*)    MCHC 28.8 (*)    RDW 20.8 (*)    Platelets 578 (*)    Neutro Abs 20.9 (*)    Monocytes Absolute 1.6 (*)    Abs Immature Granulocytes 0.20 (*)    All other components within normal limits  COMPREHENSIVE METABOLIC PANEL - Abnormal; Notable for the following components:   Potassium 3.2 (*)    CO2 19 (*)    Glucose, Bld 209 (*)    BUN 38 (*)    Calcium 8.6 (*)    Albumin 3.4 (*)    AST 69 (*)    All other components within normal limits  LACTIC ACID, PLASMA - Abnormal;  Notable for the following components:   Lactic Acid, Venous 3.9 (*)    All other components within normal limits  PROTIME-INR - Abnormal; Notable for the following components:   Prothrombin Time 16.1 (*)    INR 1.3 (*)    All other components within normal limits  CK - Abnormal; Notable for the following components:   Total CK 1,651 (*)    All other components within normal limits  ACETAMINOPHEN LEVEL - Abnormal; Notable for the following components:   Acetaminophen (Tylenol), Serum <10 (*)    All other components within normal limits  CBG MONITORING, ED - Abnormal; Notable for the following components:   Glucose-Capillary 263 (*)    All other components within normal limits  I-STAT CHEM 8, ED - Abnormal; Notable for the following components:   Potassium 3.2 (*)    BUN 34 (*)    Glucose, Bld 209 (*)    Calcium, Ion 1.13 (*)    Hemoglobin 10.2 (*)    HCT 30.0 (*)    All other components within normal limits  TROPONIN I (HIGH SENSITIVITY) - Abnormal; Notable for the following components:   Troponin I (High Sensitivity) 99 (*)    All other components within normal limits  CULTURE, BLOOD (ROUTINE X 2)  CULTURE, BLOOD (ROUTINE X 2)  URINE CULTURE  RESP PANEL BY RT-PCR (FLU A&B, COVID) ARPGX2  LIPASE, BLOOD  ETHANOL  LACTIC ACID, PLASMA  URINALYSIS, ROUTINE W REFLEX MICROSCOPIC  BLOOD GAS, ARTERIAL  POC OCCULT BLOOD, ED  TYPE AND SCREEN  TROPONIN I (HIGH SENSITIVITY)    EKG EKG Interpretation  Date/Time:  Friday January 30 2022 11:44:08 EDT Ventricular Rate:  112 PR Interval:  159 QRS Duration: 125 QT Interval:  299 QTC Calculation: 409 R Axis:   65 Text Interpretation: Sinus tachycardia Right atrial enlargement Right bundle branch block Borderline ST depression, lateral leads No old tracing to compare Confirmed by Sherwood Gambler [phone]) on 01/30/2022 12:18:58 PM  Radiology DG Chest 1 View  Result Date: 01/30/2022 CLINICAL DATA:  Altered mental status.  Fall. EXAM: CHEST   1 VIEW COMPARISON:  None available FINDINGS: Cardiac silhouette and mediastinal contours within normal limits. The lungs are clear. No pleural effusion or pneumothorax. Mild dextrocurvature of the midthoracic spine with  moderate multilevel degenerative disc changes. IMPRESSION: No active disease. Electronically Signed   By: Yvonne Kendall M.D.   On: 01/30/2022 14:08   DG Lumbar Spine Complete  Result Date: 01/30/2022 CLINICAL DATA:  Fall.  Pain. EXAM: LUMBAR SPINE - COMPLETE 4+ VIEW COMPARISON:  None available FINDINGS: There are 5 non-rib-bearing lumbar-type vertebral bodies. Minimal levocurvature centered at L2-3 with moderate right L2-3 disc space narrowing, endplate sclerosis, vacuum phenomenon, and peripheral osteophytosis. Mild right L3-4 and severe left L4-5 disc space narrowing. Large left lateral L4-5 endplate osteophytes. No sagittal spondylolisthesis. Vertebral body heights are maintained. Severe left femoroacetabular osteoarthritis with moderate to high-grade superior femoral head bone loss. IMPRESSION: Moderate multilevel degenerative disc and endplate changes greatest at the left L4-5 diffuse L5-S1 and right L2-3 levels. Electronically Signed   By: Yvonne Kendall M.D.   On: 01/30/2022 14:11   CT Angio Chest PE W and/or Wo Contrast  Result Date: 01/30/2022 CLINICAL DATA:  Altered mental status and sepsis. * Tracking Code: BO * EXAM: CT ANGIOGRAPHY CHEST CT ABDOMEN AND PELVIS WITH CONTRAST TECHNIQUE: Multidetector CT imaging of the chest was performed using the standard protocol during bolus administration of intravenous contrast. Multiplanar CT image reconstructions and MIPs were obtained to evaluate the vascular anatomy. Multidetector CT imaging of the abdomen and pelvis was performed using the standard protocol during bolus administration of intravenous contrast. RADIATION DOSE REDUCTION: This exam was performed according to the departmental dose-optimization program which includes automated  exposure control, adjustment of the mA and/or kV according to patient size and/or use of iterative reconstruction technique. CONTRAST:  151m OMNIPAQUE IOHEXOL 350 MG/ML SOLN COMPARISON:  Abdomen evaluation of the same date. FINDINGS: CTA CHEST FINDINGS Cardiovascular: There is thrombus in the RIGHT lower lobe artery with extension into RIGHT middle lobe branches, at the segmental level. There is also thrombus in RIGHT lower lobe branches at the central segmental level. Near occlusive thrombus to posterior RIGHT lower lobe branches. Central segmental involvement of all upper lobe branches on the RIGHT as well. Similar pattern of thrombosis in the LEFT chest also with lower lobe level, lobar level embolus perhaps bridging into upper lobe branches and filling lower lobe branches as well, lobar and segmental level emboli throughout the LEFT chest. There are multiple segmental level thrombi in the LEFT lower lobe, lingular branch and upper lobe branches. RV to LV ratio is increased to between 1.24 and 1.54 depending on level of measurement. Marked straightening of the interventricular septum. No pericardial effusion or signs of pericardial thickening. Aorta with mild dilation but without aneurysmal caliber. No acute aortic process with classic three-vessel branching pattern. Mediastinum/Nodes: Esophagus is patulous and thickened distally up to nearly a cm greatest thickness above the GE junction which is short segment and or focal. No thoracic inlet adenopathy. No axillary lymphadenopathy. No hilar or mediastinal lymphadenopathy. Lungs/Pleura: There is no pneumothorax. No pleural effusion. No lobar consolidation. Airways are patent. Musculoskeletal: No acute bone finding. No destructive bone process. Spinal degenerative changes. Review of the MIP images confirms the above findings. CT ABDOMEN and PELVIS FINDINGS Hepatobiliary: Signs of periportal edema. No focal, suspicious hepatic lesion. Motion limited assessment of  structures in the upper abdomen. No signs of biliary duct dilation. Pericholecystic stranding without substantial gallbladder distension and with cholelithiasis. Pancreas: Normal, without mass, inflammation or ductal dilatation. Spleen: Spleen is normal in size and contour. Adrenals/Urinary Tract: Adrenal glands are normal. Striated nephrogram bilaterally. No focal, suspicious renal lesion. Urinary bladder under distended otherwise unremarkable. Stomach/Bowel: Distal  esophageal thickening. No acute gastric or small bowel process. Normal appendix. Colon is largely decompressed with signs of diverticular changes of the sigmoid. Vascular/Lymphatic: Aortic atherosclerosis. No sign of aneurysm. Smooth contour of the IVC. There is no gastrohepatic or hepatoduodenal ligament lymphadenopathy. No retroperitoneal or mesenteric lymphadenopathy. No pelvic sidewall lymphadenopathy. Reproductive: Unremarkable by CT. Other: No ascites. Generalized mild stranding about the upper abdomen may be related to urinary tract infection. No pneumoperitoneum. Musculoskeletal: Severe LEFT hip degenerative changes. Degenerative changes of the spine. No acute or destructive bone process. Review of the MIP images confirms the above findings. IMPRESSION: 1. Positive for acute PE with CT evidence of right heart strain (RV/LV Ratio = between 1.28 and 1.54) consistent with at least submassive (intermediate risk) PE. The presence of right heart strain has been associated with an increased risk of morbidity and mortality. Please refer to the "Code PE Focused" order set in EPIC. 2. Cholelithiasis, pericholecystic stranding favored to be related to cardiogenic factors and or volume resuscitation. Could consider ultrasound as warranted for further assessment. No wall thickening or substantial distension. 3. CT findings of pyelonephritis most pronounced in the LEFT upper pole. 4. Esophagus is patulous and thickened distally up to nearly a cm greatest  thickness above the GE junction which is short segment and or focal. Findings are suspicious for neoplasm, potentially related to severe esophagitis. 5. Severe LEFT hip degenerative changes. 6. Aortic atherosclerosis. Findings of pulmonary emboli and esophageal thickening were discussed with the provider caring for the patient as outlined below. Critical Value/emergent results were called by telephone at the time of interpretation on 01/30/2022 at 2:28 pm to provider Sherwood Gambler , who verbally acknowledged these results. Electronically Signed   By: Zetta Bills M.D.   On: 01/30/2022 14:29   CT ABDOMEN PELVIS W CONTRAST  Result Date: 01/30/2022 CLINICAL DATA:  Altered mental status and sepsis. * Tracking Code: BO * EXAM: CT ANGIOGRAPHY CHEST CT ABDOMEN AND PELVIS WITH CONTRAST TECHNIQUE: Multidetector CT imaging of the chest was performed using the standard protocol during bolus administration of intravenous contrast. Multiplanar CT image reconstructions and MIPs were obtained to evaluate the vascular anatomy. Multidetector CT imaging of the abdomen and pelvis was performed using the standard protocol during bolus administration of intravenous contrast. RADIATION DOSE REDUCTION: This exam was performed according to the departmental dose-optimization program which includes automated exposure control, adjustment of the mA and/or kV according to patient size and/or use of iterative reconstruction technique. CONTRAST:  154m OMNIPAQUE IOHEXOL 350 MG/ML SOLN COMPARISON:  Abdomen evaluation of the same date. FINDINGS: CTA CHEST FINDINGS Cardiovascular: There is thrombus in the RIGHT lower lobe artery with extension into RIGHT middle lobe branches, at the segmental level. There is also thrombus in RIGHT lower lobe branches at the central segmental level. Near occlusive thrombus to posterior RIGHT lower lobe branches. Central segmental involvement of all upper lobe branches on the RIGHT as well. Similar pattern of  thrombosis in the LEFT chest also with lower lobe level, lobar level embolus perhaps bridging into upper lobe branches and filling lower lobe branches as well, lobar and segmental level emboli throughout the LEFT chest. There are multiple segmental level thrombi in the LEFT lower lobe, lingular branch and upper lobe branches. RV to LV ratio is increased to between 1.24 and 1.54 depending on level of measurement. Marked straightening of the interventricular septum. No pericardial effusion or signs of pericardial thickening. Aorta with mild dilation but without aneurysmal caliber. No acute aortic process with classic  three-vessel branching pattern. Mediastinum/Nodes: Esophagus is patulous and thickened distally up to nearly a cm greatest thickness above the GE junction which is short segment and or focal. No thoracic inlet adenopathy. No axillary lymphadenopathy. No hilar or mediastinal lymphadenopathy. Lungs/Pleura: There is no pneumothorax. No pleural effusion. No lobar consolidation. Airways are patent. Musculoskeletal: No acute bone finding. No destructive bone process. Spinal degenerative changes. Review of the MIP images confirms the above findings. CT ABDOMEN and PELVIS FINDINGS Hepatobiliary: Signs of periportal edema. No focal, suspicious hepatic lesion. Motion limited assessment of structures in the upper abdomen. No signs of biliary duct dilation. Pericholecystic stranding without substantial gallbladder distension and with cholelithiasis. Pancreas: Normal, without mass, inflammation or ductal dilatation. Spleen: Spleen is normal in size and contour. Adrenals/Urinary Tract: Adrenal glands are normal. Striated nephrogram bilaterally. No focal, suspicious renal lesion. Urinary bladder under distended otherwise unremarkable. Stomach/Bowel: Distal esophageal thickening. No acute gastric or small bowel process. Normal appendix. Colon is largely decompressed with signs of diverticular changes of the sigmoid.  Vascular/Lymphatic: Aortic atherosclerosis. No sign of aneurysm. Smooth contour of the IVC. There is no gastrohepatic or hepatoduodenal ligament lymphadenopathy. No retroperitoneal or mesenteric lymphadenopathy. No pelvic sidewall lymphadenopathy. Reproductive: Unremarkable by CT. Other: No ascites. Generalized mild stranding about the upper abdomen may be related to urinary tract infection. No pneumoperitoneum. Musculoskeletal: Severe LEFT hip degenerative changes. Degenerative changes of the spine. No acute or destructive bone process. Review of the MIP images confirms the above findings. IMPRESSION: 1. Positive for acute PE with CT evidence of right heart strain (RV/LV Ratio = between 1.28 and 1.54) consistent with at least submassive (intermediate risk) PE. The presence of right heart strain has been associated with an increased risk of morbidity and mortality. Please refer to the "Code PE Focused" order set in EPIC. 2. Cholelithiasis, pericholecystic stranding favored to be related to cardiogenic factors and or volume resuscitation. Could consider ultrasound as warranted for further assessment. No wall thickening or substantial distension. 3. CT findings of pyelonephritis most pronounced in the LEFT upper pole. 4. Esophagus is patulous and thickened distally up to nearly a cm greatest thickness above the GE junction which is short segment and or focal. Findings are suspicious for neoplasm, potentially related to severe esophagitis. 5. Severe LEFT hip degenerative changes. 6. Aortic atherosclerosis. Findings of pulmonary emboli and esophageal thickening were discussed with the provider caring for the patient as outlined below. Critical Value/emergent results were called by telephone at the time of interpretation on 01/30/2022 at 2:28 pm to provider Sherwood Gambler , who verbally acknowledged these results. Electronically Signed   By: Zetta Bills M.D.   On: 01/30/2022 14:29   DG Hip Unilat W or Wo Pelvis 2-3  Views Left  Result Date: 01/30/2022 CLINICAL DATA:  Altered mental status.  Fall. EXAM: DG HIP (WITH OR WITHOUT PELVIS) 2-3V LEFT COMPARISON:  None Available. FINDINGS: There is diffuse decreased bone mineralization. Severe left femoroacetabular osteoarthritis including superior bone-on-bone contact, extensive subchondral sclerosis and cystic change, and large peripheral osteophytes with moderate to high-grade superior femoral head and acetabular cortical flattening/bone loss/remodeling. Mild right femoroacetabular joint space narrowing and peripheral osteophytosis. The bilateral sacroiliac and pubic symphysis joint spaces are maintained. No definite acute fracture is seen. No dislocation. Moderate to severe left L4-5 disc space narrowing and peripheral degenerative osteophytosis. IMPRESSION: Severe left femoroacetabular osteoarthritis. No definite acute fracture. Electronically Signed   By: Yvonne Kendall M.D.   On: 01/30/2022 14:06    Procedures .Critical Care Performed by:  Sherwood Gambler, MD Authorized by: Sherwood Gambler, MD   Critical care provider statement:    Critical care time (minutes):  60   Critical care time was exclusive of:  Separately billable procedures and treating other patients   Critical care was necessary to treat or prevent imminent or life-threatening deterioration of the following conditions:  Sepsis, respiratory failure, shock and CNS failure or compromise   Critical care was time spent personally by me on the following activities:  Development of treatment plan with patient or surrogate, discussions with consultants, evaluation of patient's response to treatment, examination of patient, ordering and review of laboratory studies, ordering and review of radiographic studies, ordering and performing treatments and interventions, pulse oximetry, re-evaluation of patient's condition and review of old charts    Medications Ordered in ED Medications  vancomycin (VANCOREADY) IVPB  2000 mg/400 mL (2,000 mg Intravenous New Bag/Given 01/30/22 1407)  aztreonam (AZACTAM) 2 g in sodium chloride 0.9 % 100 mL IVPB (has no administration in time range)  vancomycin (VANCOREADY) IVPB 1500 mg/300 mL (has no administration in time range)  lactated ringers infusion (1,000 mLs Intravenous New Bag/Given 01/30/22 1408)  lactated ringers bolus 1,000 mL (0 mLs Intravenous Stopped 01/30/22 1312)  aztreonam (AZACTAM) 2 g in sodium chloride 0.9 % 100 mL IVPB (0 g Intravenous Stopped 01/30/22 1408)  metroNIDAZOLE (FLAGYL) IVPB 500 mg (0 mg Intravenous Stopped 01/30/22 1408)  lactated ringers bolus 1,000 mL (0 mLs Intravenous Stopped 01/30/22 1312)  iohexol (OMNIPAQUE) 350 MG/ML injection 100 mL (100 mLs Intravenous Contrast Given 01/30/22 1340)    ED Course/ Medical Decision Making/ A&P Clinical Course as of 01/30/22 1432  Fri Jan 30, 2022  1231 Patient was given about 600 cc of IV fluids by EMS.  However her blood pressure is now dropping into the 70s.  More fluids added and I think is reasonable to give antibiotics as it is unclear what is going on at this point. [SG]    Clinical Course User Index [SG] Sherwood Gambler, MD                           Medical Decision Making Amount and/or Complexity of Data Reviewed External Data Reviewed: notes. Labs: ordered. Radiology: ordered and independent interpretation performed. ECG/medicine tests: ordered and independent interpretation performed.  Risk Prescription drug management. Decision regarding hospitalization.   Patient presents with altered mental status and shock.  After 3 liters of IV fluids, her blood pressure has finally stabilized.  She is in respiratory failure though maintaining oxygen saturations with 4 L oxygen.  She is not febrile but given the shock state I have given broad antibiotics as above.  CT head images viewed by myself and shows significant edema, radiology is concerned for brain mass such as glioblastoma.  CT angiography also  viewed/interpreted by myself and shows multiple pulmonary emboli with what appears to be right heart strain.  There is no saddle embolus but there is so much clot burden that is causing right heart strain.  Discussed with ICU, Dr. Lake Bells.  Fortunately she is not requiring pressors now but I will order pressors for her [facility] transfer just in case.  We will start heparin and he recommends 4 mg Decadron.  Otherwise, he excepts her to be transferred to the [facility], ICU and will discuss with interventional radiology.  I also discussed with Dr. Saintclair Halsted, who advises 10 mg every 6 IV Decadron which has been ordered.  Otherwise he  will consult and MRI will need to be performed when at [facility].  Patient is critically ill and will need to go via [facility].  She is maintaining her airway and her vitals are stabilizing.  Of note, her weight is not 165 kg but rather 165 pounds and this needs to be adjusted in the chart.        Final Clinical Impression(s) / ED Diagnoses Final diagnoses:  Fall, initial encounter    Rx / DC Orders ED Discharge Orders     None         Sherwood Gambler, MD 01/30/22 1515

## 2022-01-30 NOTE — ED Notes (Signed)
Trop 473 called from lab. EDP notified.

## 2022-01-30 NOTE — Sepsis Progress Note (Signed)
[facility] following code sepsis °

## 2022-01-30 NOTE — ED Notes (Signed)
Date and time results received: 01/30/22 1454 (use smartphrase ".now" to insert current time)  Test: lactic Critical Value: 2.6  Name of Provider Notified: Dr. Regenia Skeeter  Orders Received? Or Actions Taken?: no/na

## 2022-01-30 NOTE — H&P (Addendum)
NAME:  Jennifer Pacheco, MRN:  [id number], DOB:  [birth year], LOS: 0 ADMISSION DATE:  01/30/2022, CONSULTATION DATE:  6/2 REFERRING MD:  Dr. Regenia Skeeter, CHIEF COMPLAINT:  PE; hypotension   History of Present Illness:  Patient is a 66 year old female with pertinent PMH of HTN, DMT2 presents to [facility] ED on 6/2 with AMS.  Patient was found down on floor at home altered covered in feces and urine and someone checked on her today on 6/2.  EMS transported patient to [facility].  Patient altered but states that she fell on her left buttock area and has been unable to to get up since.  Unsure exactly how long she has been down.  Patient noted to be hypoxic and hypotensive. Afebrile. Started on IV fluids for hypotension. Sats improved on [place]. CT chest showing submassive PE with RV/LV ratio 1.2-1.5. Started on heparin. Broad spectrum abx started. CT abd and UA concerning for UTI w/ possible pyelonephritis. CXR unremarkable. 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 NSG consulted recommend MRI and decadron. Recommend for transfer to [facility]. PCCM consulted for ICU admission.   Pertinent  Medical History  HTN DMT2  Significant Hospital Events: Including procedures, antibiotic start and stop dates in addition to other pertinent events   6/2: admitted to [facility] w/ hypotension and PE; transfer to [facility]  Interim History / Subjective:  Patient confused but able to state name and follow some commands Pale appearing On IV fluid 150 cc/hr On heparin continuous BP stable and on 4 L [place] with sats 98%  Objective   Blood pressure 117/87, pulse 96, temperature 98.5 F (36.9 C), temperature source Rectal, resp. rate (!) 22, height '5\' 5"'$  (1.651 m), weight (!) 165 kg, SpO2 100 %.        Intake/Output Summary (Last 24 hours) at 01/30/2022 1908 Last data filed at 01/30/2022 1742 Gross per 24 hour   Intake 1397.18 ml  Output 50 ml  Net 1347.18 ml   Filed Weights   01/30/22 1150  Weight: (!) 165 kg    Examination: General:  pale appearing female pleasantly confused HEENT: MM pink/moist; [place] in place Neuro: confused; states name; follows some commands; MAE; PERRL CV: s1s2, no m/r/g PULM:  dim clear BS bilaterally; [place] 4L GI: soft, bsx4 active  Extremities: warm/dry, no edema  Skin: no rashes or lesions appreciated   Resolved Hospital Problem list     Assessment & Plan:   Submassive Pulmonary Embolus: RV/LV ratio 1.28-1.54 Shock: likely combination of obstructive w/ septic shock P: -continue Heparin gtt -check echo -check BNP -trend troponin and LA -continue IV fluids -check PCT; mrsa pcr -broad spectrum abx Aztreonam and Vanc for pyelonephritis: (Allergy anaphylaxis to penicillin) -check uc, bcx2  UTI w/ possible Pyelonephtiritis P: -continue abx as above -follow cultures -IV fluids  Acute encephalopathy -UTI with pyelo and sepsis; CT head with 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 Saintclair Halsted recommended Decadron IV 10 mg q6 -Will need MRI  Acute respiratory failure with  hypoxia P: -Continue to wean [place] for sats greater than 92%  Elevated CK P: -IV fluids -trend ck  Hypokalemia P: -replete K w/ mag -check mag -trend bmp/mag and replete as needed  Anemia P: -trend cbc -repeplete for hgb <7 or hemodynamically unstable  Hyperglycemia DMT2 P: -Check A1c -SSI and CBG monitoring  Cholelithiasis P: -check RUQ [place]  Distal Esophageal thickening:  concern for neoplasm vs. 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 P: -further f/u outpatient  Best Practice (right click and "Reselect all SmartList Selections" daily)   Diet/type: NPO DVT prophylaxis: systemic heparin GI prophylaxis: PPI Lines: N/A Foley:  N/A Code Status:  full code Last date of multidisciplinary goals of care discussion [No family phone numbers on epic and patient unable to provide.]  Labs   CBC: Recent Labs  Lab 01/30/22 1206 01/30/22 1211  WBC  --  23.7*  NEUTROABS  --  20.9*  HGB 10.2* 7.9*  HCT 30.0* 27.4*  MCV  --  66.5*  PLT  --  578*    Basic Metabolic Panel: Recent Labs  Lab 01/30/22 1206 01/30/22 1211  NA 136 135  K 3.2* 3.2*  CL 102 104  CO2  --  19*  GLUCOSE 209* 209*  BUN 34* 38*  CREATININE 0.90 0.95  CALCIUM  --  8.6*   GFR: Estimated Creatinine Clearance: 93.4 mL/min (by C-G formula based on SCr of 0.95 mg/dL). Recent Labs  Lab 01/30/22 1211 01/30/22 1404  WBC 23.7*  --   LATICACIDVEN 3.9* 2.6*    Liver Function Tests: Recent Labs  Lab 01/30/22 1211  AST 69*  ALT 35  ALKPHOS 60  BILITOT 0.7  PROT 7.4  ALBUMIN 3.4*   Recent Labs  Lab 01/30/22 1211  LIPASE 37   No results for input(s): AMMONIA in the last 168 hours.  ABG    Component Value Date/Time   PHART 7.43 01/30/2022 1420   PCO2ART 32 01/30/2022 1420   PO2ART 61 (L) 01/30/2022 1420   HCO3 21.2 01/30/2022 1420   TCO2 22 01/30/2022 1206   ACIDBASEDEF 2.8 (H) 01/30/2022 1420   O2SAT 94.3 01/30/2022 1420     Coagulation Profile: Recent Labs  Lab 01/30/22 1211  INR 1.3*    Cardiac Enzymes: Recent Labs  Lab 01/30/22 1211  CKTOTAL 1,651*    HbA1C: No results found for: HGBA1C  CBG: Recent Labs  Lab 01/30/22 1143  GLUCAP 263*    Review of Systems:   Patient is encephalopathic and/or intubated. Therefore history has been  obtained from chart review.    Past Medical History:  She,  has a past medical history of Hard of hearing.   Surgical History:   Past Surgical History:  Procedure Laterality Date   BREAST BIOPSY Left 2020   beg     Social History:   reports that she has never smoked. She has never used smokeless tobacco. She reports that she does not currently use alcohol. She reports current drug use. Drug: Marijuana.   Family History:  Her family history includes Breast cancer in her maternal aunt.   Allergies Allergies  Allergen Reactions   Penicillins Anaphylaxis     Home Medications  Prior to Admission medications   Medication Sig Start Date End Date Taking? Authorizing Provider  lisinopril (ZESTRIL) 20 MG tablet Take 20 mg by mouth daily. 04/15/20  Yes [provider]  meloxicam (MOBIC) 15 MG  tablet Take 15 mg by mouth daily as needed for pain.   Yes [provider]  cyclobenzaprine (FLEXERIL) 10 MG tablet Take by mouth. Patient not taking: Reported on 01/30/2022    [provider]  metFORMIN (GLUCOPHAGE) 500 MG tablet Take by mouth. Patient not taking: Reported on 01/30/2022    [provider]  Omega-3 1000 MG CAPS Take by mouth. Patient not taking: Reported on 01/30/2022    [provider]     Critical care time: 45 minutes    JD Rexene Agent [facility] 01/30/2022, 7:08 PM  Please see [email or web address] for pager details.  From 7A-7P if no response, please call [phone]. After hours, please call ELink [phone].

## 2022-01-30 NOTE — Progress Notes (Signed)
eLink Physician-Brief Progress Note Patient Name: Jennifer Pacheco DOB: 10/08/1955 MRN: [id number]   Date of Service  01/30/2022  HPI/Events of Note  66 year old female with HTN, DM2 who was found down for unknown time covered in feces and urine who presented to [facility] for AMS  and found hypotensive to the 70s and hypoxemic. Given 3L IVF and on 4L O2. CT head with brain mass with edema and 100m left midline shift. CTA with multiple left sided PE. CT A/P concerning for left pyelonephritis. Started on decadron and heparin. [facility] CXR no acute issues Trop 99>473  On camera, patient awake but confused. SBP 100s with no pressor requirement.   eICU Interventions  Continue antibiotics  heparin, decadron Consider MRI however current mental status will be limiting       Intervention Category Evaluation Type: New Patient Evaluation  Aritzel Krusemark JRodman Pickle6/10/2021, 9:09 PM

## 2022-01-30 NOTE — Progress Notes (Signed)
[facility] Progress Note Patient Name: Ahjanae Cassel DOB: 04-14-1956 MRN: [id number]   Date of Service  01/30/2022  HPI/Events of Note  Admission labs reviewed.  WBC improved 15 Hg 6.8. No active bleeding CMET pending LA resolved Procal pending  Trop pending BNP 196  eICU Interventions  Transfuse 1 U PRBC Trend CBC post-transfusion. If not improved will need to assess heparin gtt use     Intervention Category Intermediate Interventions: Coagulopathy - evaluation and management  Azrael Huss Rodman Pickle 01/30/2022, 11:51 PM

## 2022-01-30 NOTE — Progress Notes (Signed)
Pharmacy Antibiotic Note  Jennifer Pacheco a 66 y.o. female admitted on 01/30/2022 with sepsis.  Pharmacy has been consulted for vancomycin and aztreonam dosing.  Initial dosing based on weight previously recorded as 165 kg, now corrected to 153 lb.  Plan: Change Vancomycin 750 mg IV q12h  Medical History: Past Medical History:  Diagnosis Date   Hard of hearing     Allergies:  Allergies  Allergen Reactions   Penicillins Anaphylaxis    Filed Weights   01/30/22 1150 01/30/22 1912 01/30/22 2053  Weight: (!) 165 kg (363 lb 12.1 oz) 74.8 kg (165 lb) 69.5 kg (153 lb 3.5 oz)       Latest Ref Rng & Units 01/30/2022   11:15 PM 01/30/2022   12:11 PM 01/30/2022   12:06 PM  CBC  WBC 4.0 - 10.5 K/uL 15.7   23.7     Hemoglobin 12.0 - 15.0 g/dL 6.8   7.9   10.2    Hematocrit 36.0 - 46.0 % 23.9   27.4   30.0    Platelets 150 - 400 K/uL 447   578        Estimated Creatinine Clearance: 57.8 mL/min (by C-G formula based on SCr of 0.95 mg/dL).  Antibiotics Given (last 72 hours)     Date/Time Action Medication Dose Rate   01/30/22 1249 New Bag/Given   aztreonam (AZACTAM) 2 g in sodium chloride 0.9 % 100 mL IVPB 2 g 200 mL/hr   01/30/22 1257 New Bag/Given   metroNIDAZOLE (FLAGYL) IVPB 500 mg 500 mg 100 mL/hr   01/30/22 1407 New Bag/Given   vancomycin (VANCOREADY) IVPB 2000 mg/400 mL 2,000 mg 200 mL/hr   01/30/22 2137 New Bag/Given   aztreonam (AZACTAM) 2 g in sodium chloride 0.9 % 100 mL IVPB 2 g 200 mL/hr       Antimicrobials this admission:  aztreonam 01/30/2022  >>  vancomycin 01/30/2022  >>  Metronidazole 01/30/2022   x 1   Microbiology results: 01/30/2022  BCx: sent 01/30/2022  UCx: sent 01/30/2022  Resp Panel: sent  01/30/2022  MRSA PCR: sent  Kamsiyochukwu Spickler, Bronson Curb, PharmD Clinical Pharmacist

## 2022-01-30 NOTE — ED Notes (Signed)
>   than 557 ml of urine inside bladder

## 2022-01-30 NOTE — Progress Notes (Signed)
Pharmacy Antibiotic Note  Jennifer Pacheco a 66 y.o. female admitted on 01/30/2022 with sepsis.  Pharmacy has been consulted for vancomycin and cefepime dosing.  Plan: Vancomycin '1500mg'$  IV every 12 hours.  Goal trough 15-20 mcg/mL. Aztreonam 2gm iv q8h  Medical History: Past Medical History:  Diagnosis Date   Hard of hearing     Allergies:  Allergies  Allergen Reactions   Penicillins Anaphylaxis    Filed Weights   01/30/22 1150  Weight: (!) 165 kg (363 lb 12.1 oz)       Latest Ref Rng & Units 01/30/2022   12:06 PM  CBC  Hemoglobin 12.0 - 15.0 g/dL 10.2    Hematocrit 36.0 - 46.0 % 30.0       Estimated Creatinine Clearance: 98.6 mL/min (by C-G formula based on SCr of 0.9 mg/dL).  Antibiotics Given (last 72 hours)     None       Antimicrobials this admission:  aztreonam 01/30/2022  >>  vancomycin 01/30/2022  >>  Metronidazole 01/30/2022   x 1   Microbiology results: 01/30/2022  BCx: sent 01/30/2022  UCx: sent 01/30/2022  Resp Panel: sent  01/30/2022  MRSA PCR: sent  Thank you for allowing pharmacy to be a part of this patient's care.  Thomasenia Sales, PharmD Clinical Pharmacist

## 2022-01-30 NOTE — Progress Notes (Signed)
ANTICOAGULATION CONSULT NOTE - Initial Consult  Pharmacy Consult for heparin gtt  Indication: pulmonary embolus  Allergies  Allergen Reactions   Penicillins Anaphylaxis    Patient Measurements: Height: '5\' 5"'$  (165.1 cm) Weight: (!) 165 kg (363 lb 12.1 oz) IBW/kg (Calculated) : 57 Heparin Dosing Weight: HEPARIN DW (KG): 99.4   Vital Signs: Temp: 98.5 F (36.9 C) (06/02 1147) Temp Source: Rectal (06/02 1147) BP: 109/74 (06/02 1445) Pulse Rate: 103 (06/02 1445)  Labs: Recent Labs    01/30/22 1206 01/30/22 1211  HGB 10.2* 7.9*  HCT 30.0* 27.4*  PLT  --  578*  LABPROT  --  16.1*  INR  --  1.3*  CREATININE 0.90 0.95  CKTOTAL  --  1,651*    Estimated Creatinine Clearance: 93.4 mL/min (by C-G formula based on SCr of 0.95 mg/dL).   Medical History: Past Medical History:  Diagnosis Date   Hard of hearing     Medications:  (Not in a hospital admission)  Scheduled:   dexamethasone (DECADRON) injection  10 mg Intravenous Q6H   dexamethasone (DECADRON) injection  4 mg Intravenous Once   heparin  4,000 Units Intravenous Once   Infusions:   sodium chloride     aztreonam     heparin     lactated ringers 1,000 mL (01/30/22 1408)   norepinephrine (LEVOPHED) Adult infusion     [START ON 01/31/2022] vancomycin     vancomycin 2,000 mg (01/30/22 1407)   PRN:  Anti-infectives (From admission, onward)    Start     Dose/Rate Route Frequency Ordered Stop   01/31/22 0200  vancomycin (VANCOREADY) IVPB 1500 mg/300 mL        1,500 mg 150 mL/hr over 120 Minutes Intravenous Every 12 hours 01/30/22 1241     01/30/22 2100  aztreonam (AZACTAM) 2 g in sodium chloride 0.9 % 100 mL IVPB        2 g 200 mL/hr over 30 Minutes Intravenous Every 8 hours 01/30/22 1240     01/30/22 1245  aztreonam (AZACTAM) 2 g in sodium chloride 0.9 % 100 mL IVPB        2 g 200 mL/hr over 30 Minutes Intravenous  Once 01/30/22 1231 01/30/22 1408   01/30/22 1245  metroNIDAZOLE (FLAGYL) IVPB 500 mg         500 mg 100 mL/hr over 60 Minutes Intravenous  Once 01/30/22 1231 01/30/22 1408   01/30/22 1245  vancomycin (VANCOCIN) IVPB 1000 mg/200 mL premix  Status:  Discontinued        1,000 mg 200 mL/hr over 60 Minutes Intravenous  Once 01/30/22 1231 01/30/22 1239   01/30/22 1245  vancomycin (VANCOREADY) IVPB 2000 mg/400 mL        2,000 mg 200 mL/hr over 120 Minutes Intravenous  Once 01/30/22 1239         Assessment: Jennifer Pacheco a 65 y.o. female requires anticoagulation with a heparin iv infusion for the indication of  pulmonary embolus. Heparin gtt will be started following pharmacy protocol per pharmacy consult. Patient is not on previous oral anticoagulant that will require aPTT/HL correlation before transitioning to only HL monitoring.   Goal of Therapy:  Heparin level 0.3-0.7 units/ml Monitor platelets by anticoagulation protocol: Yes   Plan:  Give 4000 units bolus x 1 Start heparin infusion at 1600 units/hr Check anti-Xa level in 6 hours and daily while on heparin Continue to monitor H&H and platelets   Jennifer Pacheco 01/30/2022,3:05 PM

## 2022-01-30 NOTE — Progress Notes (Signed)
1255 Patient to CT, RT will return to obtain ABG.

## 2022-01-31 ENCOUNTER — Inpatient Hospital Stay (HOSPITAL_COMMUNITY): Payer: Medicaid Other

## 2022-01-31 DIAGNOSIS — I2602 Saddle embolus of pulmonary artery with acute cor pulmonale: Secondary | ICD-10-CM

## 2022-01-31 DIAGNOSIS — I2699 Other pulmonary embolism without acute cor pulmonale: Secondary | ICD-10-CM | POA: Diagnosis not present

## 2022-01-31 DIAGNOSIS — R579 Shock, unspecified: Secondary | ICD-10-CM | POA: Diagnosis not present

## 2022-01-31 DIAGNOSIS — G9389 Other specified disorders of brain: Secondary | ICD-10-CM | POA: Diagnosis not present

## 2022-01-31 DIAGNOSIS — L8990 Pressure ulcer of unspecified site, unspecified stage: Secondary | ICD-10-CM | POA: Diagnosis present

## 2022-01-31 LAB — CBC
HCT: 26.3 % — ABNORMAL LOW (ref 36.0–46.0)
HCT: 25.8 % — ABNORMAL LOW (ref 36.0–46.0)
Hemoglobin: 7.9 g/dL — ABNORMAL LOW (ref 12.0–15.0)
Hemoglobin: 8.1 g/dL — ABNORMAL LOW (ref 12.0–15.0)
MCH: 20.6 pg — ABNORMAL LOW (ref 26.0–34.0)
MCH: 20.9 pg — ABNORMAL LOW (ref 26.0–34.0)
MCHC: 30 g/dL (ref 30.0–36.0)
MCHC: 31.4 g/dL (ref 30.0–36.0)
MCV: 68.7 fL — ABNORMAL LOW (ref 80.0–100.0)
MCV: 66.5 fL — ABNORMAL LOW (ref 80.0–100.0)
Platelets: 430 10*3/uL — ABNORMAL HIGH (ref 150–400)
Platelets: 452 10*3/uL — ABNORMAL HIGH (ref 150–400)
RBC: 3.83 MIL/uL — ABNORMAL LOW (ref 3.87–5.11)
RBC: 3.88 MIL/uL (ref 3.87–5.11)
RDW: 21.9 % — ABNORMAL HIGH (ref 11.5–15.5)
RDW: 21.3 % — ABNORMAL HIGH (ref 11.5–15.5)
WBC: 13.6 10*3/uL — ABNORMAL HIGH (ref 4.0–10.5)
WBC: 13.5 10*3/uL — ABNORMAL HIGH (ref 4.0–10.5)
nRBC: 0.4 % — ABNORMAL HIGH (ref 0.0–0.2)
nRBC: 0.3 % — ABNORMAL HIGH (ref 0.0–0.2)

## 2022-01-31 LAB — GLUCOSE, CAPILLARY
Glucose-Capillary: 131 mg/dL — ABNORMAL HIGH (ref 70–99)
Glucose-Capillary: 155 mg/dL — ABNORMAL HIGH (ref 70–99)
Glucose-Capillary: 132 mg/dL — ABNORMAL HIGH (ref 70–99)
Glucose-Capillary: 170 mg/dL — ABNORMAL HIGH (ref 70–99)
Glucose-Capillary: 134 mg/dL — ABNORMAL HIGH (ref 70–99)
Glucose-Capillary: 216 mg/dL — ABNORMAL HIGH (ref 70–99)
Glucose-Capillary: 151 mg/dL — ABNORMAL HIGH (ref 70–99)

## 2022-01-31 LAB — HEPARIN LEVEL (UNFRACTIONATED)
Heparin Unfractionated: 0.62 IU/mL (ref 0.30–0.70)
Heparin Unfractionated: 0.56 IU/mL (ref 0.30–0.70)
Heparin Unfractionated: 0.22 IU/mL — ABNORMAL LOW (ref 0.30–0.70)

## 2022-01-31 LAB — PROCALCITONIN: Procalcitonin: 0.21 ng/mL

## 2022-01-31 LAB — BASIC METABOLIC PANEL
Anion gap: 8 (ref 5–15)
BUN: 23 mg/dL (ref 8–23)
CO2: 20 mmol/L — ABNORMAL LOW (ref 22–32)
Calcium: 8.3 mg/dL — ABNORMAL LOW (ref 8.9–10.3)
Chloride: 105 mmol/L (ref 98–111)
Creatinine, Ser: 0.58 mg/dL (ref 0.44–1.00)
GFR, Estimated: >60 mL/min (ref >60)
Glucose, Bld: 135 mg/dL — ABNORMAL HIGH (ref 70–99)
Potassium: 4.3 mmol/L (ref 3.5–5.1)
Sodium: 133 mmol/L — ABNORMAL LOW (ref 135–145)

## 2022-01-31 LAB — MAGNESIUM: Magnesium: 2.1 mg/dL (ref 1.7–2.4)

## 2022-01-31 LAB — LACTIC ACID, PLASMA: Lactic Acid, Venous: 1.5 mmol/L (ref 0.5–1.9)

## 2022-01-31 LAB — ECHOCARDIOGRAM COMPLETE
Height: 65 in
S' Lateral: 2.4 cm
Weight: 2451.52 oz

## 2022-01-31 LAB — HIV ANTIBODY (ROUTINE TESTING W REFLEX): HIV Screen 4th Generation wRfx: NONREACTIVE

## 2022-01-31 LAB — TROPONIN I (HIGH SENSITIVITY): Troponin I (High Sensitivity): 762 ng/L (ref <18)

## 2022-01-31 LAB — PREPARE RBC (CROSSMATCH)

## 2022-01-31 LAB — CK: Total CK: 691 U/L — ABNORMAL HIGH (ref 38–234)

## 2022-01-31 IMAGING — MR MR HEAD W/O CM
4 of 7 series · 12 of 48 positions shown · non-contrast
Comparison: Head CT yesterday.

CLINICAL DATA: Mental status change.  Follow-up brain tumor.

EXAM:
MRI HEAD WITHOUT CONTRAST
TECHNIQUE: Multiplanar, multiecho pulse sequences of the brain and surrounding
structures were obtained without intravenous contrast.

[Series 5: DWI · axial · 3.0mm · 0.94mm/px · z∈[-19,+77]mm · 3 of 98 slices shown (1 of 2)]
[im 17/98]
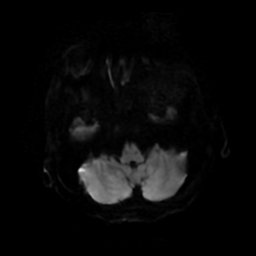
[im 49/98]
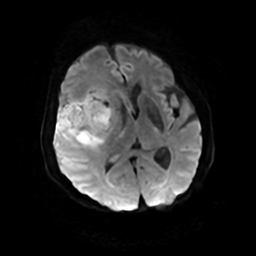
[im 81/98]
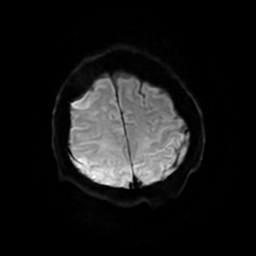

[Series 6: DWI · coronal · 4.0mm · 0.94mm/px · 3 of 67 slices shown (2 of 2)]
[im 8/67]
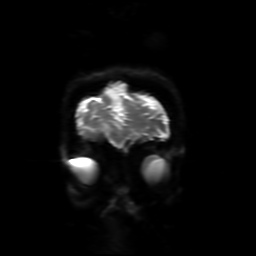
[im 37/67]
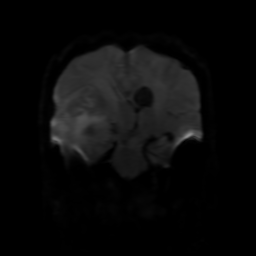
[im 59/67]
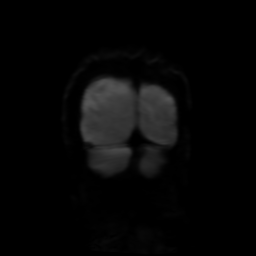

[Series 7: FLAIR · sagittal · 5.0mm · 0.23mm/px · 3 of 26 slices shown (1 of 2)]
[im 1/26]
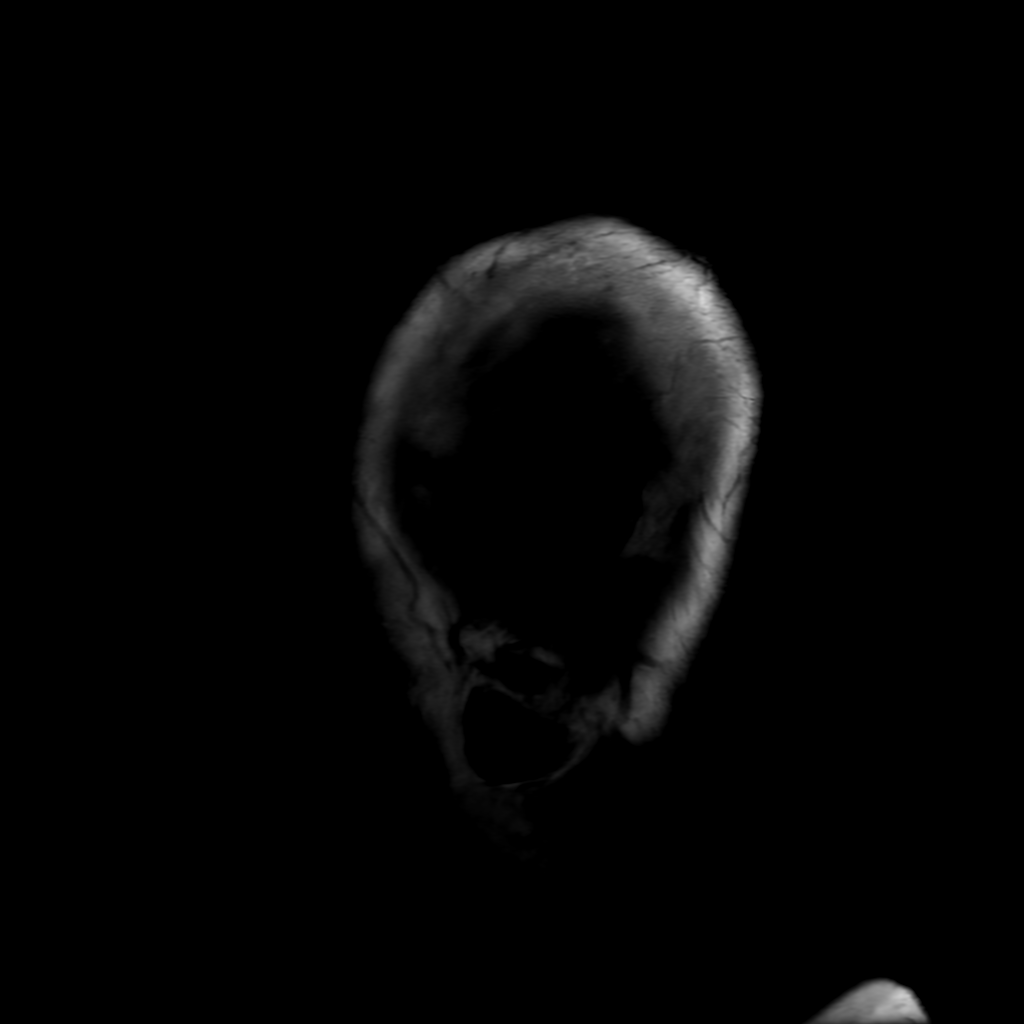
[im 17/26]
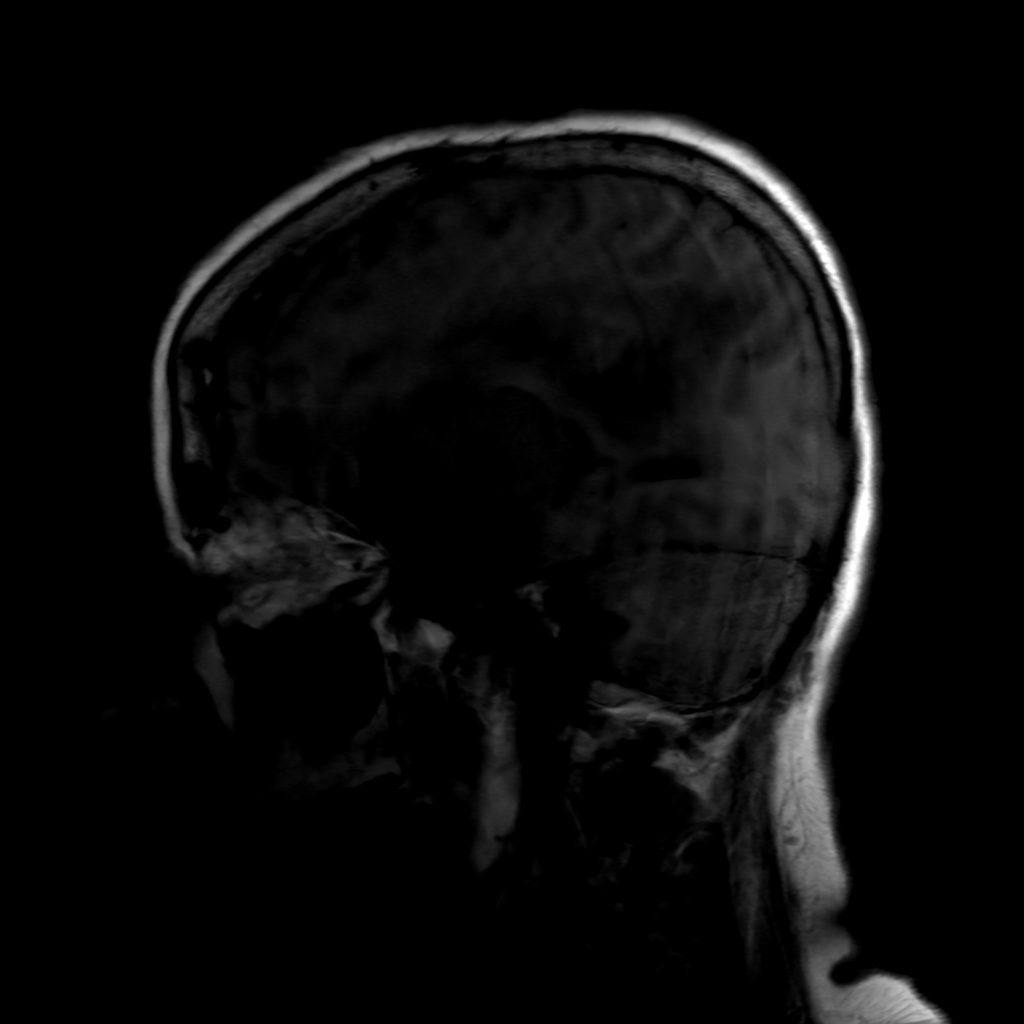
[im 26/26]
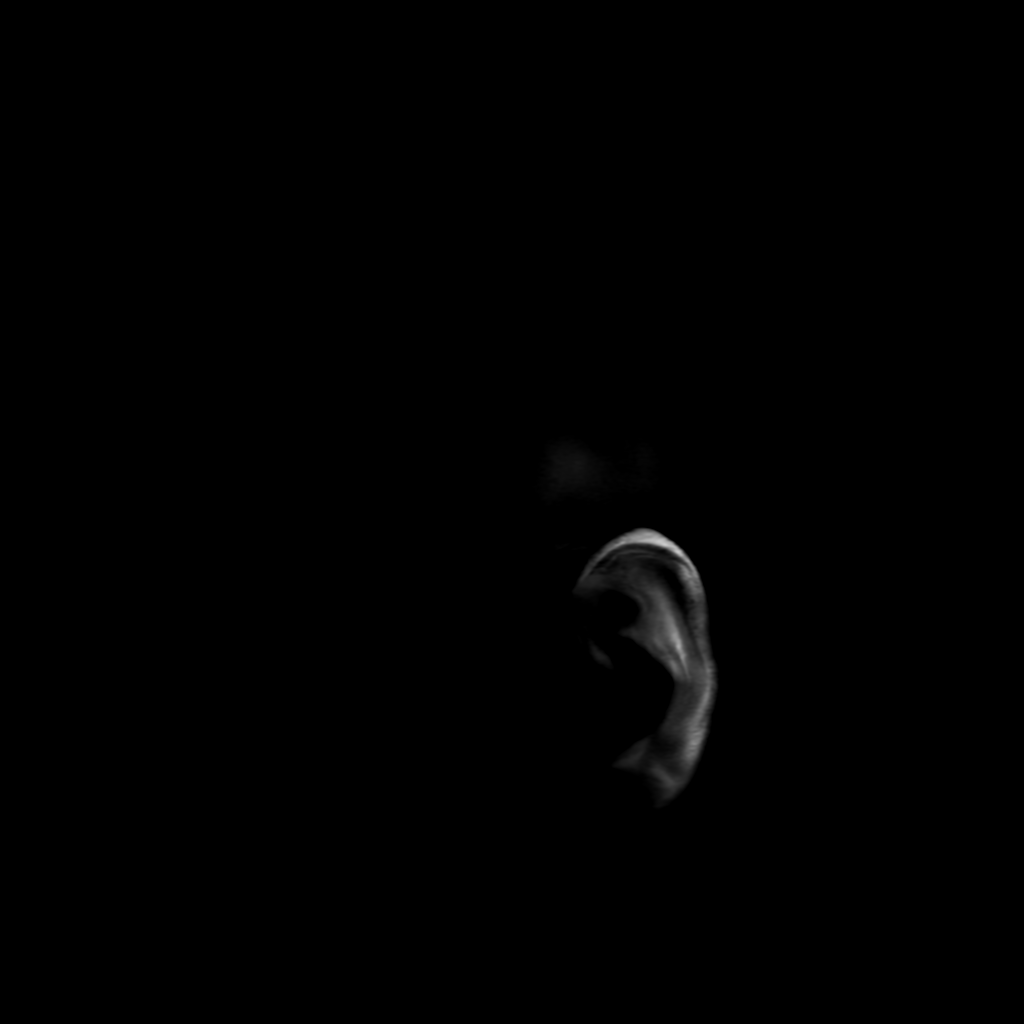

[Series 9: FLAIR · axial · 4.0mm · 0.45mm/px · z∈[-40,+105]mm · 3 of 34 slices shown (2 of 2)]
[im 1/34]
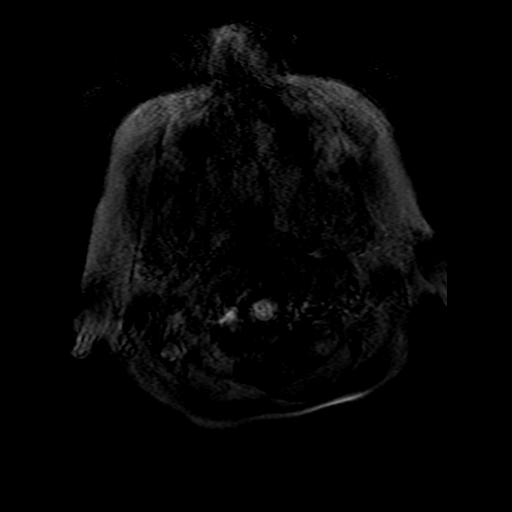
[im 17/34]
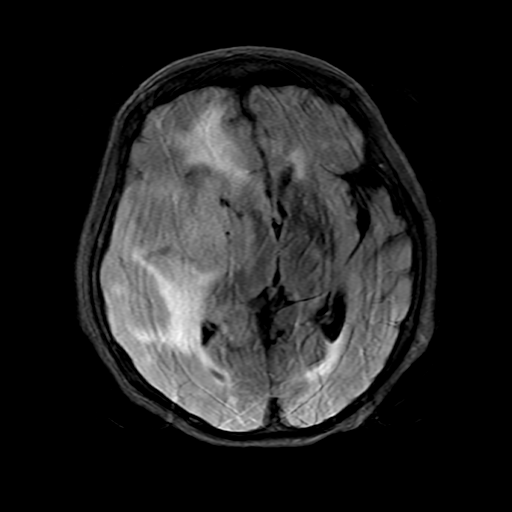
[im 34/34]
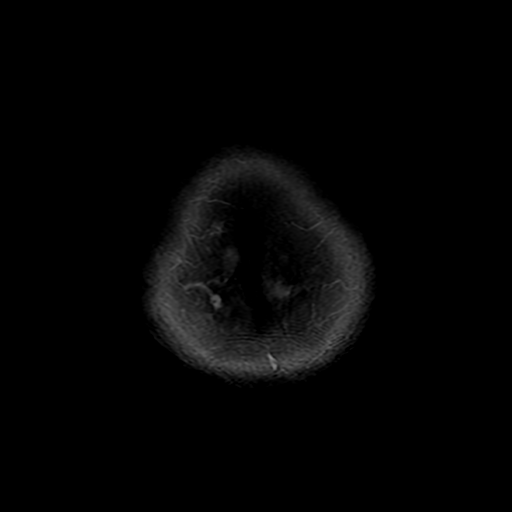

[12 of 48 positions shown; findings below may reference images not displayed]

FINDINGS: Brain: The patient was disoriented and would not hold still for
scanning. Contrast enhanced imaging was not achieved. The only
coronal imaging achieve was the diffusion study. Medication or
anesthesia may be necessary.

The brainstem and cerebellum are normal. Both cerebral hemispheres
show chronic appearing small vessel ischemic changes of the white
matter. There is an approximately 5.2 x 5.3 cm mass lesion in the
Peri insular brain on the right, exact site of origin not
determined, but temporal lobe is favored. I would say there is some
possibility that this could even be an extra-axial mass lesion. The
surrounding brain shows pronounced vasogenic edema. There is mass
effect with right-to-left shift of 11 mm. No
hydrocephalus/ventricular trapping.

Vascular: Major vessels at the base of the brain show flow.

Skull and upper cervical spine: Negative

Sinuses/Orbits: Clear/normal

Other: None
IMPRESSION: Limited and abbreviated study because of the patient's condition and
patient motion. The study confirms the presence of an approximately
5.2 x 5.3 cm mass on the right. I think this could be intra-axial,
probably arising from the temporal insula, but I think there is some
potential that it could be an extra-axial mass. No evidence of
necrosis or pronounced heterogeneity. Mass effect with right-to-left
shift 11 mm. Vasogenic edema within the right hemisphere.

Chronic small-vessel ischemic changes of the cerebral hemispheric
white matter.

## 2022-01-31 IMAGING — US US ABDOMEN LIMITED
1 series · 14 of 25 positions shown · non-contrast
Comparison: CT scan of the abdomen and pelvis [DATE]

CLINICAL DATA: Cholelithiasis.

EXAM:
ULTRASOUND ABDOMEN LIMITED RIGHT UPPER QUADRANT

[Series 1: us abdomen limited ruq (liver/gb) · 14 of 39 slices shown]
[im 1/39]
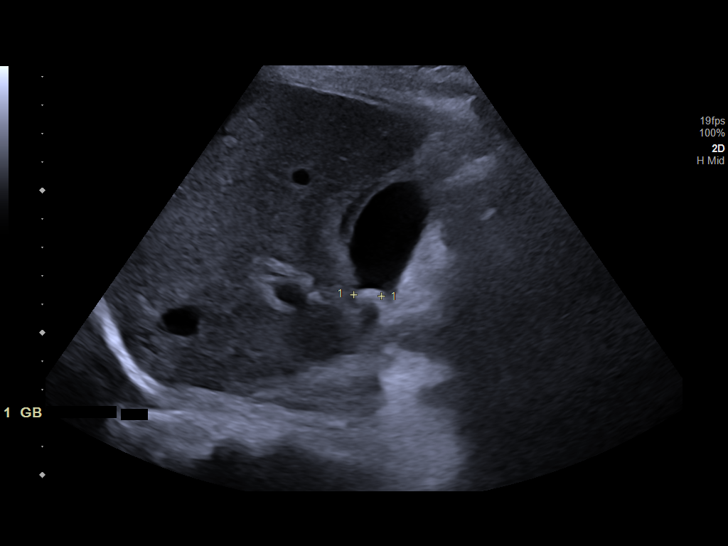
[im 4/39]
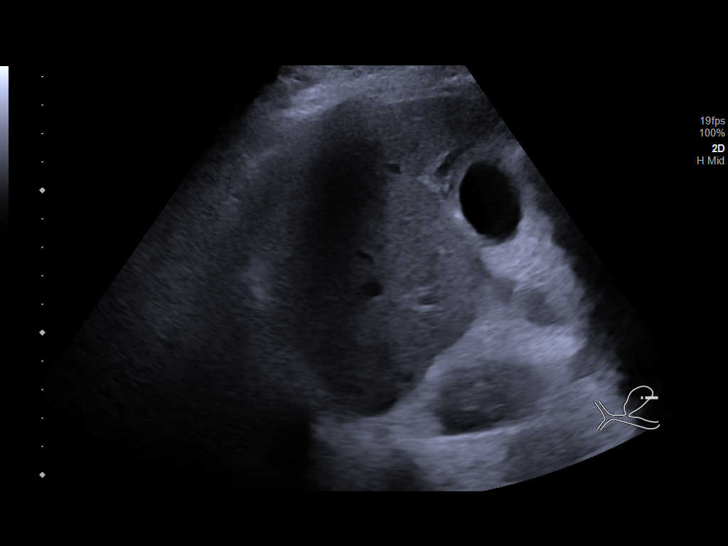
[im 7/39]
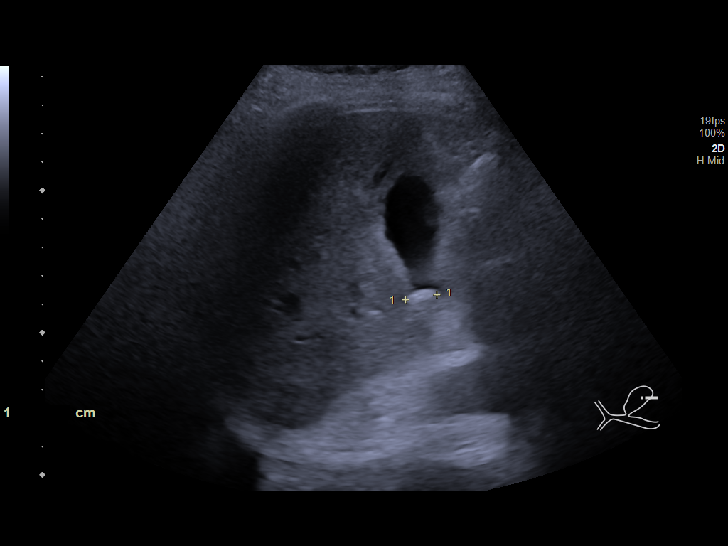
[im 10/39]
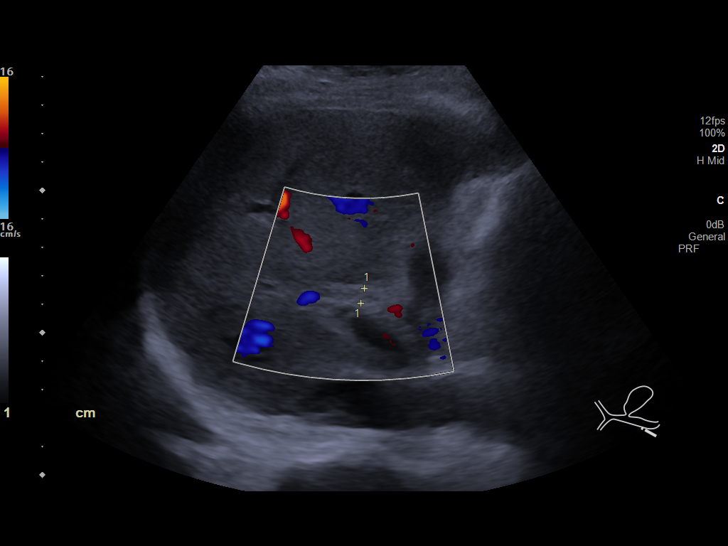
[im 13/39]
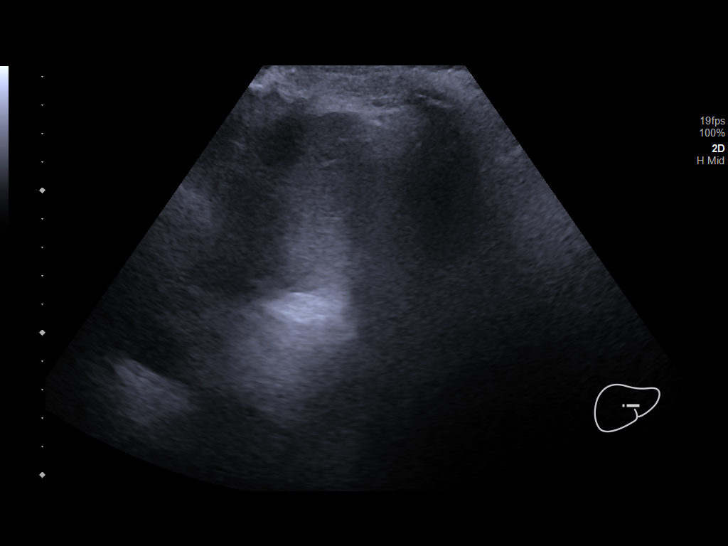
[im 15/39]
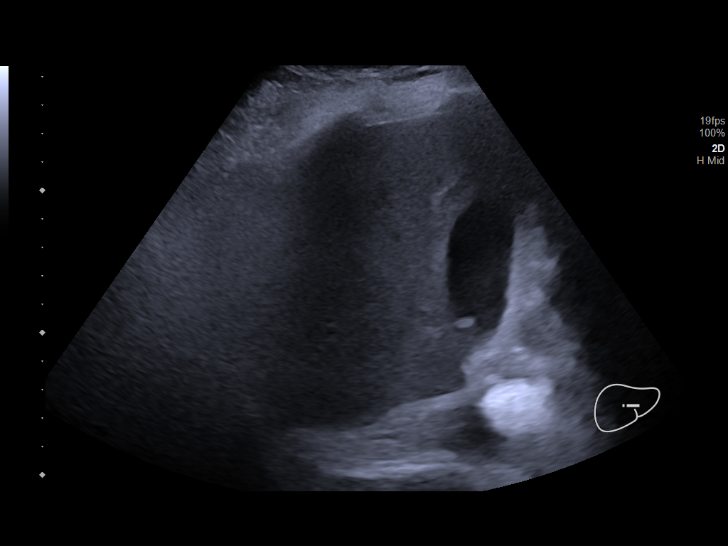
[im 18/39]
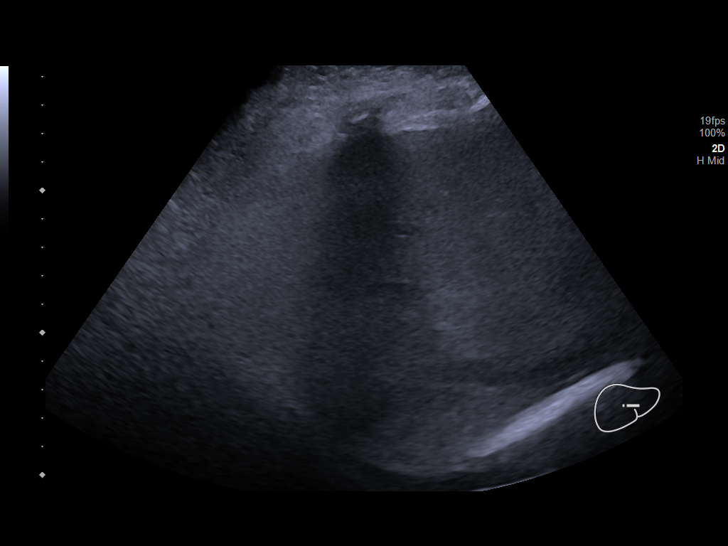
[im 21/39]
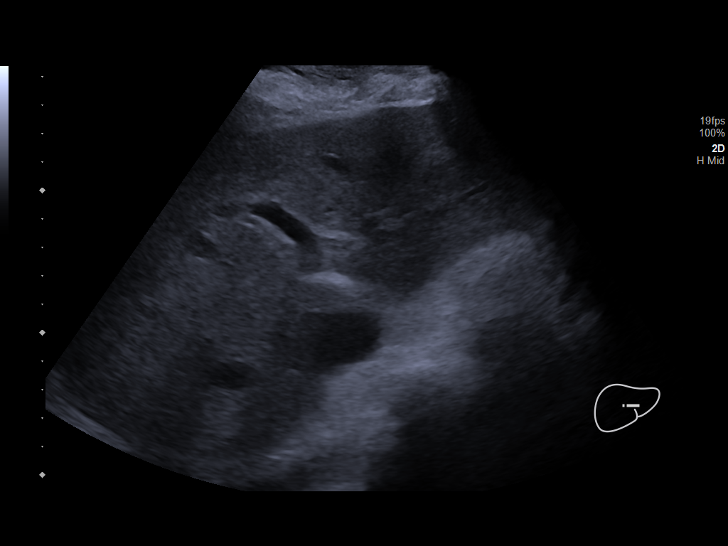
[im 24/39]
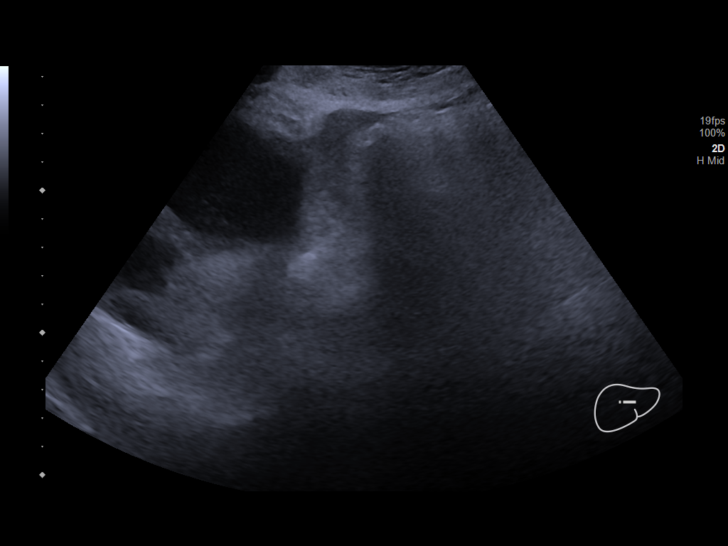
[im 26/39]
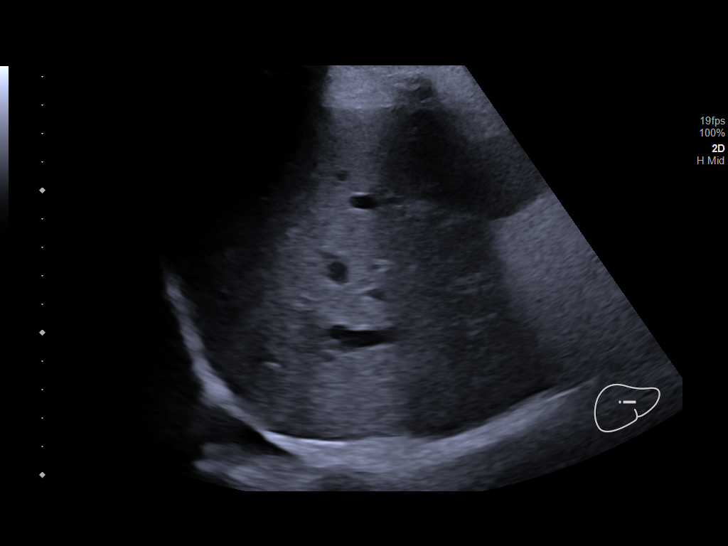
[im 29/39]
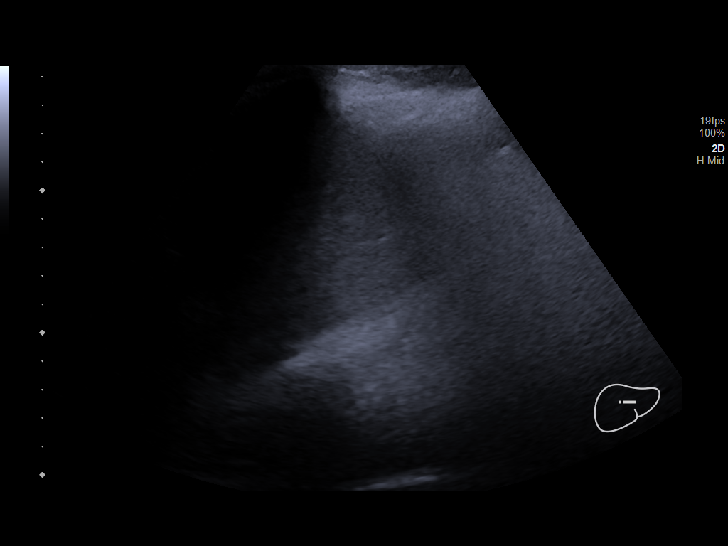
[im 32/39]
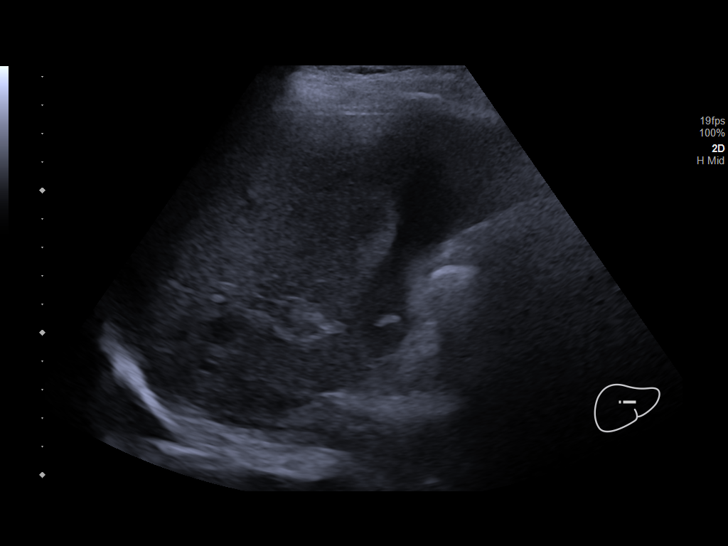
[im 35/39]
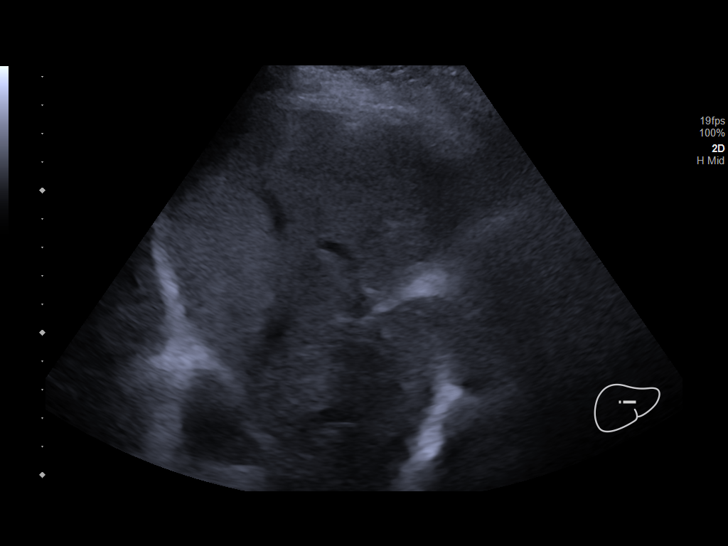
[im 39/39]
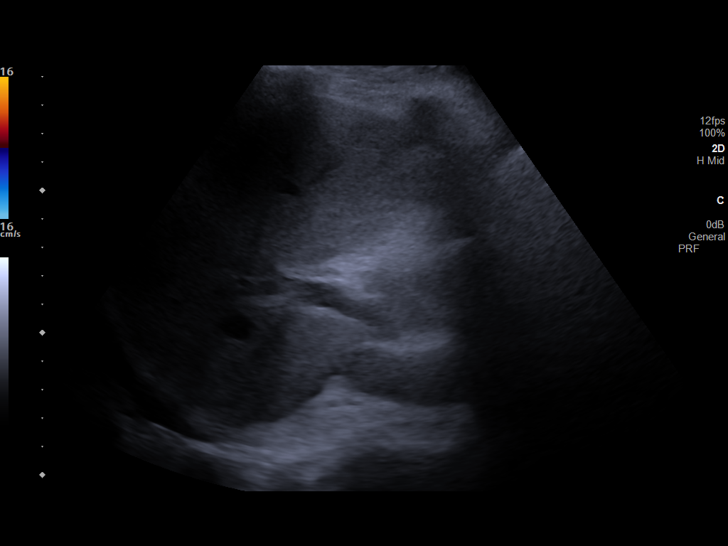

[14 of 25 positions shown; findings below may reference images not displayed]

FINDINGS: Gallbladder:

Cholelithiasis. Mild wall thickening measuring 3.5 mm. Mild
pericholecystic fluid. No Murphy's sign.

Common bile duct:

Diameter: 5.4 mm

Liver:

No focal lesion identified. Within normal limits in parenchymal
echogenicity. Portal vein is patent on color Doppler imaging with
normal direction of blood flow towards the liver.

Other: None.
IMPRESSION: 1. Cholelithiasis, mild pericholecystic fluid, and mild gallbladder
wall thickening. No Murphy's sign. If the clinical picture remains
ambiguous, a HIDA scan could further evaluate.
2. The common bile duct and liver are unremarkable.

## 2022-01-31 MED ORDER — VANCOMYCIN HCL 750 MG/150ML IV SOLN
750.0000 mg | Freq: Two times a day (BID) | INTRAVENOUS | Status: DC
  Filled 2022-01-31: qty 150

## 2022-01-31 MED ORDER — ONDANSETRON HCL 4 MG/2ML IJ SOLN: 4.0000 mg | Freq: Four times a day (QID) | INTRAMUSCULAR | Status: AC | PRN

## 2022-01-31 MED ORDER — DEXTROSE-NACL 5-0.45 % IV SOLN: INTRAVENOUS | Status: DC

## 2022-01-31 MED ORDER — CALCIUM CARBONATE ANTACID 500 MG PO CHEW
1.0000 | CHEWABLE_TABLET | Freq: Once | ORAL | Status: AC
  Administered 2022-01-31: 200 mg via ORAL
  Filled 2022-01-31: qty 1

## 2022-01-31 MED ORDER — QUETIAPINE FUMARATE 25 MG PO TABS
25.0000 mg | ORAL_TABLET | Freq: Once | ORAL | Status: AC
  Administered 2022-01-31: 25 mg via ORAL
  Filled 2022-01-31: qty 1

## 2022-01-31 NOTE — Progress Notes (Addendum)
[facility] Progress Note Patient Name: Jennifer Pacheco DOB: 1955/11/13 MRN: [id number]   Date of Service  01/31/2022  HPI/Events of Note  Mild agitation at bedside, confused and trying to get out of bed C/o of indigestion  eICU Interventions  Check Qtc. 450 ms Seroquel 25 mg once PRN Tums ordered   11:59PM C/o nausea. PRN Zofran ordered   Intervention Category Minor Interventions: Agitation / anxiety - evaluation and management  Diannia Hogenson Rodman Pickle 01/31/2022, 8:51 PM

## 2022-01-31 NOTE — Progress Notes (Signed)
NAME:  Jennifer Pacheco, MRN:  [id number], DOB:  [birth year], LOS: 1 ADMISSION DATE:  01/30/2022, CONSULTATION DATE:  6/2 REFERRING MD:  Dr. Regenia Skeeter, CHIEF COMPLAINT:  PE; hypotension   History of Present Illness:  Patient is a 66 year old female with pertinent PMH of HTN, DMT2 presents to [facility] ED on 6/2 with AMS.  Patient was found down on floor at home altered covered in feces and urine and someone checked on her today on 6/2.  EMS transported patient to [facility].  Patient altered but states that she fell on her left buttock area and has been unable to to get up since.  Unsure exactly how long she has been down.  Patient noted to be hypoxic and hypotensive. Afebrile. Started on IV fluids for hypotension. Sats improved on [place]. CT chest showing submassive PE with RV/LV ratio 1.2-1.5. Started on heparin. Broad spectrum abx started. CT abd and UA concerning for UTI w/ possible pyelonephritis. CXR unremarkable. 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 NSG consulted recommend MRI and decadron. Recommend for transfer to [facility]. PCCM consulted for ICU admission.   Pertinent  Medical History  HTN DMT2  Significant Hospital Events: Including procedures, antibiotic start and stop dates in addition to other pertinent events   6/2: admitted to [facility] w/ hypotension and PE; transfer to [facility]  Interim History / Subjective:  -Not currently on pressors -Received 1 unit PRBC post transfer to [facility] -Heparin drip infusing -Currently on room air  Objective   Blood pressure 123/82, pulse 80, temperature 97.6 F (36.4 C), temperature source Axillary, resp. rate (!) 21, height '5\' 5"'$  (1.651 m), weight 69.5 kg, SpO2 95 %.        Intake/Output Summary (Last 24 hours) at 01/31/2022 0707 Last data filed at 01/31/2022 0600 Gross per 24 hour  Intake 3611.01 ml  Output 900 ml  Net 2711.01  ml   Filed Weights   01/30/22 1912 01/30/22 2053 01/31/22 0355  Weight: 74.8 kg 69.5 kg 69.5 kg    Examination: General: Somewhat disheveled woman, no distress.  Pleasantly confused HEENT: Oropharynx clear, pupils equal Neuro: Awake, poor hearing.  Answers questions appropriately but then tangential.  Poor memory.  Moves all extremities and follows commands CV: Distant, regular, no murmur PULM: Decreased to both bases.  Clear bilaterally GI: Obese, nondistended, positive bowel sounds Extremities: No edema Skin: No rash   Resolved Hospital Problem list     Assessment & Plan:   Submassive Pulmonary Embolus: RV/LV ratio 1.28-1.54 Shock: likely combination of obstructive w/ septic shock P: -Continue heparin infusion.  No clear indication for targeted lytics based on clot burden and location.  This would be high risk given the large intracranial mass. -Echocardiogram performed, final read pending -Echocardiogram pending -Follow BMP, troponin (peak 802), lactate (cleared) -Treating urinary tract infection and presumed severe sepsis as below  UTI w/ possible Pyelonephtiritis P: -Plan continue aztreonam.  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 Saintclair Halsted recommended Decadron IV 10 mg q6 > continue -Needs MRI brain, ordered  Acute respiratory failure with hypoxia due to pulmonary  embolism P: -Improving, weaning FiO2 -Okay to start p.o. diet, adequate airway protection  Elevated CK P: -Continue IV fluids -Trend CPK  Hypokalemia, improved P: -Follow BMP, magnesium -Replete as indicated  Anemia P: -Follow CBC -Transfusion threshold hemoglobin 7.0  Hyperglycemia DMT2 P: -Check A1c -SSI and CBG monitoring  Cholelithiasis P: -check RUQ [place] > pending  Distal Esophageal thickening: concern for neoplasm vs. 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 P: -further f/u outpatient  Best Practice (right click and "Reselect all SmartList Selections" daily)   Diet/type: NPO DVT prophylaxis: systemic heparin GI prophylaxis: PPI Lines: N/A Foley:  N/A Code Status:  full code Last date of multidisciplinary goals of care discussion [No family phone numbers on epic and patient unable to provide.]  Labs   CBC: Recent Labs  Lab 01/30/22 1206 01/30/22 1211 01/30/22 2315  WBC  --  23.7* 15.7*  NEUTROABS  --  20.9*  --   HGB 10.2* 7.9* 6.8*  HCT 30.0* 27.4* 23.9*  MCV  --  66.5* 66.2*  PLT  --  578* 447*    Basic Metabolic Panel: Recent Labs  Lab 01/30/22 1206 01/30/22 1211 01/30/22 2315  NA 136 135 135  K 3.2* 3.2* 3.7  CL 102 104 105  CO2  --  19* 19*  GLUCOSE 209* 209* 137*  BUN 34* 38* 26*  CREATININE 0.90 0.95 0.66  CALCIUM  --  8.6* 8.3*  MG  --   --  1.7   GFR: Estimated Creatinine Clearance: 68.6 mL/min (by C-G formula based on SCr of 0.66 mg/dL). Recent Labs  Lab 01/30/22 1211 01/30/22 1404 01/30/22 2315 01/31/22 0030  PROCALCITON  --   --  0.21  --   WBC 23.7*  --  15.7*  --   LATICACIDVEN 3.9* 2.6* 1.6 1.5    Liver Function Tests: Recent Labs  Lab 01/30/22 1211 01/30/22 2315  AST 69* 59*  ALT 35 37  ALKPHOS 60 52  BILITOT 0.7 0.5  PROT 7.4 6.3*  ALBUMIN 3.4* 2.8*   Recent Labs  Lab  01/30/22 1211  LIPASE 37   No results for input(s): AMMONIA in the last 168 hours.  ABG    Component Value Date/Time   PHART 7.43 01/30/2022 1420   PCO2ART 32 01/30/2022 1420   PO2ART 61 (L) 01/30/2022 1420   HCO3 21.2 01/30/2022 1420   TCO2 22 01/30/2022 1206   ACIDBASEDEF 2.8 (H) 01/30/2022 1420   O2SAT 94.3 01/30/2022 1420     Coagulation Profile: Recent Labs  Lab 01/30/22 1211  INR 1.3*    Cardiac Enzymes: Recent Labs  Lab 01/30/22 1211  CKTOTAL 1,651*    HbA1C: Hgb A1c MFr Bld  Date/Time Value Ref Range Status  01/30/2022 11:15 PM 5.5 4.8 - 5.6 % Final    Comment:    (NOTE) Pre diabetes:          5.7%-6.4%  Diabetes:              >6.4%  Glycemic control for   <7.0% adults with diabetes     CBG: Recent Labs  Lab 01/30/22 1143 01/30/22 2041 01/30/22 2345  01/31/22 [place]* 132* 155*    Critical care time: 32 minutes     Baltazar Apo, MD, PhD 01/31/2022, 7:07 AM [facility] [phone] or if no answer before 7:00PM call [phone] For any issues after 7:00PM please call eLink [phone]

## 2022-01-31 NOTE — Progress Notes (Signed)
ANTICOAGULATION CONSULT NOTE  Pharmacy Consult for Heparin Indication: pulmonary embolus  Allergies  Allergen Reactions   Penicillins Anaphylaxis    Patient Measurements: Height: '5\' 5"'$  (165.1 cm) Weight: 69.5 kg (153 lb 3.5 oz) IBW/kg (Calculated) : 57 Heparin Dosing Weight: 69.5 kg   Vital Signs: Temp: 98.1 F (36.7 C) (06/03 1624) Temp Source: Oral (06/03 1624) BP: 120/76 (06/03 1800) Pulse Rate: 89 (06/03 1800)  Labs: Recent Labs    01/30/22 1211 01/30/22 1404 01/30/22 2315 01/31/22 0030 01/31/22 0630 01/31/22 1025 01/31/22 1755  HGB 7.9*  --  6.8*  --   --  8.1*  --   HCT 27.4*  --  23.9*  --   --  25.8*  --   PLT 578*  --  447*  --   --  452*  --   LABPROT 16.1*  --   --   --   --   --   --   INR 1.3*  --   --   --   --   --   --   HEPARINUNFRC  --   --   --   --   --  0.56 0.22*  CREATININE 0.95  --  0.66  --  0.58  --   --   CKTOTAL 1,651*  --   --   --  691*  --   --   TROPONINIHS 99* 473* 802* 762*  --   --   --      Estimated Creatinine Clearance: 68.6 mL/min (by C-G formula based on SCr of 0.58 mg/dL).   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   Repeat heparin level is subtherapeutic at 0.22. No bleeding or infusion issues per RN.  Goal of Therapy:  Heparin level 0.3-0.7 units/ml Monitor platelets by anticoagulation protocol: Yes   Plan:  Increase heparin back to 1100 units/h Recheck heparin level in 8h  Arrie Senate, PharmD, [place], [facility] Clinical Pharmacist [phone] Please check AMION for all [facility] Pharmacy numbers 01/31/2022

## 2022-01-31 NOTE — Progress Notes (Signed)
  Echocardiogram 2D Echocardiogram has been performed.  Bobbye Charleston 01/31/2022, 8:37 AM

## 2022-01-31 NOTE — Progress Notes (Signed)
ANTICOAGULATION CONSULT NOTE - Initial Consult  Pharmacy Consult for Heparin Indication: pulmonary embolus  Allergies  Allergen Reactions   Penicillins Anaphylaxis    Patient Measurements: Height: '5\' 5"'$  (165.1 cm) Weight: 69.5 kg (153 lb 3.5 oz) IBW/kg (Calculated) : 57 Heparin Dosing Weight: 69.5 kg   Vital Signs: Temp: 97.7 F (36.5 C) (06/03 0758) Temp Source: Oral (06/03 0758) BP: 123/82 (06/03 0900) Pulse Rate: 77 (06/03 0900)  Labs: Recent Labs    01/30/22 1206 01/30/22 1206 01/30/22 1211 01/30/22 1404 01/30/22 2315 01/31/22 0030 01/31/22 0630 01/31/22 1025  HGB 10.2*  --  7.9*  --  6.8*  --   --   --   HCT 30.0*  --  27.4*  --  23.9*  --   --   --   PLT  --   --  578*  --  447*  --   --   --   LABPROT  --   --  16.1*  --   --   --   --   --   INR  --   --  1.3*  --   --   --   --   --   HEPARINUNFRC  --   --   --   --   --   --   --  0.56  CREATININE 0.90  --  0.95  --  0.66  --  0.58  --   CKTOTAL  --   --  1,651*  --   --   --  691*  --   TROPONINIHS  --    < > 99* 473* 802* 762*  --   --    < > = values in this interval not displayed.    Estimated Creatinine Clearance: 68.6 mL/min (by C-G formula based on SCr of 0.58 mg/dL).   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   Heparin level of 0.56 is therapeutic on heparin 1100 units/hr. Level drawn appropriately. No bleeding noted per RN. Hgb 8.1 s/p transfusion. Plt elevated.   Goal of Therapy:  Heparin level 0.3-0.7 units/ml Monitor platelets by anticoagulation protocol: Yes   Plan:  Decrease heparin to 1050 units/hr with CT head findings to favor heparin level in range of 0.3-0.5 units/ml.  Check 8 hr confirmatory heparin level  Monitor heparin level, cbc and s/s of bleeding daily   Cristela Felt, PharmD,  BCPS Clinical Pharmacist 01/31/2022 11:22 AM

## 2022-01-31 NOTE — Consult Note (Signed)
Reason for Consult: Brain tumor Referring Physician: Emergency department critical care medicine  Jennifer Pacheco is an 66 y.o. female.  HPI: 66 year old presented to outside emergency room yesterday with altered mental status confusion tachycardia and hypotension.  Initially patient was thought to be septic was started on antibiotics after work-up initiated which revealed a mass in her brain as well as multiple pulmonary emboli with right heart strain possible pyelonephritis.  Patient was initiated on Decadron antibiotics and patient has improved and is transferred to [facility].  Currently patient denies any headache has been nauseated throughout her first night postop but she is tolerating ice cream now.  Past Medical History:  Diagnosis Date   Hard of hearing     Past Surgical History:  Procedure Laterality Date   BREAST BIOPSY Left 2020   beg    Family History  Problem Relation Age of Onset   Breast cancer Maternal Aunt     Social History:  reports that she has never smoked. She has never used smokeless tobacco. She reports that she does not currently use alcohol. She reports current drug use. Drug: Marijuana.  Allergies:  Allergies  Allergen Reactions   Penicillins Anaphylaxis    Medications: I have reviewed the patient's current medications.  Results for orders placed or performed during the hospital encounter of 01/30/22 (from the past 48 hour(s))  CBG monitoring, ED     Status: Abnormal   Collection Time: 01/30/22 11:43 AM  Result Value Ref Range   Glucose-Capillary 263 (H) 70 - 99 mg/dL    Comment: Glucose reference range applies only to samples taken after fasting for at least 8 hours.  Culture, blood (routine x 2)     Status: None (Preliminary result)   Collection Time: 01/30/22 11:59 AM   Specimen: Right Antecubital; Blood  Result Value Ref Range   Specimen Description      RIGHT ANTECUBITAL BOTTLES DRAWN AEROBIC AND ANAEROBIC   Special Requests Blood  Culture adequate volume    Culture      NO GROWTH < 12 HOURS Performed at [facility], [address] [place], [place] [address]    Report Status PENDING   I-stat chem 8, ED     Status: Abnormal   Collection Time: 01/30/22 12:06 PM  Result Value Ref Range   Sodium 136 135 - 145 mmol/L   Potassium 3.2 (L) 3.5 - 5.1 mmol/L   Chloride 102 98 - 111 mmol/L   BUN 34 (H) 8 - 23 mg/dL   Creatinine, Ser 0.90 0.44 - 1.00 mg/dL   Glucose, Bld 209 (H) 70 - 99 mg/dL    Comment: Glucose reference range applies only to samples taken after fasting for at least 8 hours.   Calcium, Ion 1.13 (L) 1.15 - 1.40 mmol/L   TCO2 22 22 - 32 mmol/L   Hemoglobin 10.2 (L) 12.0 - 15.0 g/dL   HCT 30.0 (L) 36.0 - 46.0 %  CBC with Differential     Status: Abnormal   Collection Time: 01/30/22 12:11 PM  Result Value Ref Range   WBC 23.7 (H) 4.0 - 10.5 K/uL   RBC 4.12 3.87 - 5.11 MIL/uL   Hemoglobin 7.9 (L) 12.0 - 15.0 g/dL    Comment: Reticulocyte Hemoglobin testing may be clinically indicated, consider ordering this additional test [id number]    HCT 27.4 (L) 36.0 - 46.0 %   MCV 66.5 (L) 80.0 - 100.0 fL   MCH 19.2 (L) 26.0 - 34.0 pg   MCHC  28.8 (L) 30.0 - 36.0 g/dL   RDW 20.8 (H) 11.5 - 15.5 %   Platelets 578 (H) 150 - 400 K/uL   nRBC 0.1 0.0 - 0.2 %   Neutrophils Relative % 88 %   Neutro Abs 20.9 (H) 1.7 - 7.7 K/uL   Lymphocytes Relative 4 %   Lymphs Abs 1.0 0.7 - 4.0 K/uL   Monocytes Relative 7 %   Monocytes Absolute 1.6 (H) 0.1 - 1.0 K/uL   Eosinophils Relative 0 %   Eosinophils Absolute 0.0 0.0 - 0.5 K/uL   Basophils Relative 0 %   Basophils Absolute 0.1 0.0 - 0.1 K/uL   WBC Morphology MORPHOLOGY UNREMARKABLE    RBC Morphology MORPHOLOGY UNREMARKABLE    Smear Review MORPHOLOGY UNREMARKABLE    Immature Granulocytes 1 %   Abs Immature Granulocytes 0.20 (H) 0.00 - 0.07 K/uL   Polychromasia PRESENT     Comment: Performed at [facility], [address] [place], [place] [address]  Comprehensive  metabolic panel     Status: Abnormal   Collection Time: 01/30/22 12:11 PM  Result Value Ref Range   Sodium 135 135 - 145 mmol/L   Potassium 3.2 (L) 3.5 - 5.1 mmol/L   Chloride 104 98 - 111 mmol/L   CO2 19 (L) 22 - 32 mmol/L   Glucose, Bld 209 (H) 70 - 99 mg/dL    Comment: Glucose reference range applies only to samples taken after fasting for at least 8 hours.   BUN 38 (H) 8 - 23 mg/dL   Creatinine, Ser 0.95 0.44 - 1.00 mg/dL   Calcium 8.6 (L) 8.9 - 10.3 mg/dL   Total Protein 7.4 6.5 - 8.1 g/dL   Albumin 3.4 (L) 3.5 - 5.0 g/dL   AST 69 (H) 15 - 41 U/L   ALT 35 0 - 44 U/L   Alkaline Phosphatase 60 38 - 126 U/L   Total Bilirubin 0.7 0.3 - 1.2 mg/dL   GFR, Estimated >60 >60 mL/min    Comment: (NOTE) Calculated using the CKD-EPI Creatinine Equation (2021)    Anion gap 12 5 - 15    Comment: Performed at [facility], [address], [place], [place] [address]  Troponin I (High Sensitivity)     Status: Abnormal   Collection Time: 01/30/22 12:11 PM  Result Value Ref Range   Troponin I (High Sensitivity) 99 (H) <18 ng/L    Comment: (NOTE) Elevated high sensitivity troponin I (hsTnI) values and significant  changes across serial measurements may suggest ACS but many other  chronic and acute conditions are known to elevate hsTnI results.  Refer to the "Links" section for chest pain algorithms and additional  guidance. Performed at [facility], [address], [place], [place] [address]   Lipase, blood     Status: None   Collection Time: 01/30/22 12:11 PM  Result Value Ref Range   Lipase 37 11 - 51 U/L    Comment: Performed at [facility], [address], [place], [place] [address]  Lactic acid, plasma     Status: Abnormal   Collection Time: 01/30/22 12:11 PM  Result Value Ref Range   Lactic Acid, Venous 3.9 (HH) 0.5 - 1.9 mmol/L    Comment: CRITICAL RESULT CALLED TO, READ BACK BY AND VERIFIED WITH: TO ROBERT WARD,RN '@1240'$  ON 01/30/22 BY GMCGEEHON Performed at [facility],  [address] [place], [place] [address]   Protime-INR     Status: Abnormal   Collection Time: 01/30/22 12:11 PM  Result Value Ref Range   Prothrombin Time 16.1 (H) 11.4 - 15.2 seconds   INR 1.3 (H) 0.8 - 1.2    Comment: (NOTE) INR goal varies based on device and disease states. Performed at [facility], [address] [place], [place] [address]   CK     Status: Abnormal   Collection Time: 01/30/22 12:11 PM  Result Value Ref Range   Total CK 1,651 (H) 38 - 234 U/L    Comment: Performed at [facility], [address], [place], [place] [address]  Acetaminophen level     Status: Abnormal   Collection Time: 01/30/22 12:12 PM  Result Value Ref Range   Acetaminophen (Tylenol), Serum <10 (L) 10 - 30 ug/mL    Comment: (NOTE) Therapeutic concentrations vary significantly. A range of 10-30 ug/mL  may be an effective concentration for many patients. However, some  are best treated at concentrations outside of this range. Acetaminophen concentrations >150 ug/mL at 4 hours after ingestion  and >50 ug/mL at 12 hours after ingestion are often associated with  toxic reactions.  Performed at [facility], [address] [place], [place] [address]   Ethanol     Status: None   Collection Time: 01/30/22 12:12 PM  Result Value Ref Range   Alcohol, Ethyl (B) <10 <10 mg/dL    Comment: (NOTE) Lowest detectable limit for serum alcohol is 10 mg/dL.  For medical purposes only. Performed at [facility], [address] [place], [place] [address]   Culture, blood (routine x 2)     Status: None (Preliminary result)   Collection Time: 01/30/22 12:38 PM   Specimen: BLOOD LEFT HAND  Result Value Ref Range   Specimen Description      BLOOD LEFT HAND BOTTLES DRAWN AEROBIC AND ANAEROBIC   Special Requests Blood Culture adequate volume    Culture      NO GROWTH < 12 HOURS Performed at [facility], [address] [place],  [address]    Report Status PENDING   Resp Panel by RT-PCR (Flu A&B, Covid)  Anterior Nasal Swab     Status: None   Collection Time: 01/30/22  1:59 PM   Specimen: Anterior Nasal Swab  Result Value Ref Range   SARS Coronavirus 2 by RT PCR NEGATIVE NEGATIVE    Comment: (NOTE) SARS-CoV-2 target nucleic acids are NOT DETECTED.  The SARS-CoV-2 RNA is generally detectable in upper respiratory specimens during the acute phase of infection. The lowest concentration of SARS-CoV-2 viral copies this assay can detect is 138 copies/mL. A negative result does not preclude SARS-Cov-2 infection and should not be used as the sole basis for treatment or other patient management decisions. A negative result may occur with  improper specimen collection/handling, submission of specimen other than nasopharyngeal swab, presence of viral mutation(s) within the areas targeted by this assay, and inadequate number of viral copies(<138 copies/mL). A negative result must be combined with clinical observations, patient history, and epidemiological information. The expected result is Negative.  Fact Sheet for Patients:  EntrepreneurPulse.com.au  Fact Sheet for Healthcare Providers:  IncredibleEmployment.be  This test is no t yet approved or cleared by the [place] FDA and  has been authorized for detection and/or diagnosis of SARS-CoV-2 by FDA under an Emergency Use Authorization (EUA). This EUA will remain  in effect (meaning this test can be used) for the duration of the COVID-19 declaration under Section 564(b)(1) of the Act, 21 U.S.C.section 360bbb-3(b)(1), unless the authorization is terminated  or revoked  sooner.       Influenza A by PCR NEGATIVE NEGATIVE   Influenza B by PCR NEGATIVE NEGATIVE    Comment: (NOTE) The Xpert Xpress SARS-CoV-2/FLU/RSV plus assay is intended as an aid in the diagnosis of influenza from Nasopharyngeal swab specimens and should not be used as a sole basis for treatment. Nasal washings and aspirates are  unacceptable for Xpert Xpress SARS-CoV-2/FLU/RSV testing.  Fact Sheet for Patients: EntrepreneurPulse.com.au  Fact Sheet for Healthcare Providers: IncredibleEmployment.be  This test is not yet approved or cleared by the [place] FDA and has been authorized for detection and/or diagnosis of SARS-CoV-2 by FDA under an Emergency Use Authorization (EUA). This EUA will remain in effect (meaning this test can be used) for the duration of the COVID-19 declaration under Section 564(b)(1) of the Act, 21 U.S.C. section 360bbb-3(b)(1), unless the authorization is terminated or revoked.  Performed at [facility], [address], [place], [place] [address]   Lactic acid, plasma     Status: Abnormal   Collection Time: 01/30/22  2:04 PM  Result Value Ref Range   Lactic Acid, Venous 2.6 (HH) 0.5 - 1.9 mmol/L    Comment: CRITICAL RESULT CALLED TO, READ BACK BY AND VERIFIED WITH: TO TINA TALBOT,RN '@1450'$  ON 01/30/22 BY GMCGEEHON. Performed at [facility], [address], [place], [place] [address]   Type and screen [facility]     Status: None   Collection Time: 01/30/22  2:04 PM  Result Value Ref Range   ABO/RH(D) A POS    Antibody Screen NEG    Sample Expiration      02/02/2022,2359 Performed at [facility], [address] [place], [place] [address]   Troponin I (High Sensitivity)     Status: Abnormal   Collection Time: 01/30/22  2:04 PM  Result Value Ref Range   Troponin I (High Sensitivity) 473 (HH) <18 ng/L    Comment: CRITICAL RESULT CALLED TO, READ BACK BY AND VERIFIED WITH: JOHN COOK,RN '@1518'$  ON 01/30/22 BY GMCGEEHON. (NOTE) Elevated high sensitivity troponin I (hsTnI) values and significant  changes across serial measurements may suggest ACS but many other  chronic and acute conditions are known to elevate hsTnI results.  Refer to the Links section for chest pain algorithms and additional  guidance. Performed at [facility],  [address] [place], [place] [address]   Blood gas, arterial     Status: Abnormal   Collection Time: 01/30/22  2:20 PM  Result Value Ref Range   FIO2 28.0 %   pH, Arterial 7.43 7.35 - 7.45   pCO2 arterial 32 32 - 48 mmHg   pO2, Arterial 61 (L) 83 - 108 mmHg   Bicarbonate 21.2 20.0 - 28.0 mmol/L   Acid-base deficit 2.8 (H) 0.0 - 2.0 mmol/L   O2 Saturation 94.3 %   Patient temperature 36.9    Collection site RIGHT RADIAL    Drawn by [id number]    Allens test (pass/fail) PASS PASS    Comment: Performed at [facility], [address], [place], [place] [address]  Urinalysis, Routine w reflex microscopic     Status: Abnormal   Collection Time: 01/30/22  5:20 PM  Result Value Ref Range   Color, Urine YELLOW YELLOW   APPearance CLEAR CLEAR   Specific Gravity, Urine 1.044 (H) 1.005 - 1.030   pH 5.0 5.0 - 8.0   Glucose, UA NEGATIVE NEGATIVE mg/dL   Hgb urine dipstick SMALL (A) NEGATIVE   Bilirubin Urine NEGATIVE NEGATIVE  Ketones, ur 20 (A) NEGATIVE mg/dL   Protein, ur 30 (A) NEGATIVE mg/dL   Nitrite POSITIVE (A) NEGATIVE   Leukocytes,Ua SMALL (A) NEGATIVE   RBC / HPF 0-5 0 - 5 RBC/hpf   WBC, UA 11-20 0 - 5 WBC/hpf   Bacteria, UA RARE (A) NONE SEEN   Mucus PRESENT     Comment: Performed at [facility], [address] [place], [place] [address]  MRSA Next Gen by PCR, Nasal     Status: None   Collection Time: 01/30/22  8:35 PM   Specimen: Nasal Mucosa; Nasal Swab  Result Value Ref Range   MRSA by PCR Next Gen NOT DETECTED NOT DETECTED    Comment: (NOTE) The GeneXpert MRSA Assay (FDA approved for NASAL specimens only), is one component of a comprehensive MRSA colonization surveillance program. It is not intended to diagnose MRSA infection nor to guide or monitor treatment for MRSA infections. Test performance is not FDA approved in patients less than 22 years old. Performed at [facility], [place] [address], [place], [place] [address]   Glucose, capillary     Status: Abnormal    Collection Time: 01/30/22  8:41 PM  Result Value Ref Range   Glucose-Capillary 131 (H) 70 - 99 mg/dL    Comment: Glucose reference range applies only to samples taken after fasting for at least 8 hours.  Rapid urine drug screen (hospital performed)     Status: None   Collection Time: 01/30/22 10:18 PM  Result Value Ref Range   Opiates NONE DETECTED NONE DETECTED   Cocaine NONE DETECTED NONE DETECTED   Benzodiazepines NONE DETECTED NONE DETECTED   Amphetamines NONE DETECTED NONE DETECTED   Tetrahydrocannabinol NONE DETECTED NONE DETECTED   Barbiturates NONE DETECTED NONE DETECTED    Comment: (NOTE) DRUG SCREEN FOR MEDICAL PURPOSES ONLY.  IF CONFIRMATION IS NEEDED FOR ANY PURPOSE, NOTIFY LAB WITHIN 5 DAYS.  LOWEST DETECTABLE LIMITS FOR URINE DRUG SCREEN Drug Class                     Cutoff (ng/mL) Amphetamine and metabolites    1000 Barbiturate and metabolites    200 Benzodiazepine                 419 Tricyclics and metabolites     300 Opiates and metabolites        300 Cocaine and metabolites        300 THC                            50 Performed at [facility], [place] [address], [place], [place] [address]   Type and screen [place]     Status: None (Preliminary result)   Collection Time: 01/30/22 10:33 PM  Result Value Ref Range   ABO/RH(D) A POS    Antibody Screen NEG    Sample Expiration 02/02/2022,2359    Unit Number [id number]    Blood Component Type RED CELLS,LR    Unit division 00    Status of Unit ISSUED    Transfusion Status OK TO TRANSFUSE    Crossmatch Result      Compatible Performed at [facility], [place] [address] [place], [place] [address]   HIV Antibody (routine testing w rflx)     Status: None   Collection Time: 01/30/22 11:15 PM  Result Value Ref Range   HIV Screen 4th  Generation wRfx Non Reactive Non Reactive    Comment: Performed at [facility], [place] [address] [place], [place] [address]  Comprehensive  metabolic panel     Status: Abnormal   Collection Time: 01/30/22 11:15 PM  Result Value Ref Range   Sodium 135 135 - 145 mmol/L   Potassium 3.7 3.5 - 5.1 mmol/L   Chloride 105 98 - 111 mmol/L   CO2 19 (L) 22 - 32 mmol/L   Glucose, Bld 137 (H) 70 - 99 mg/dL    Comment: Glucose reference range applies only to samples taken after fasting for at least 8 hours.   BUN 26 (H) 8 - 23 mg/dL   Creatinine, Ser 0.66 0.44 - 1.00 mg/dL   Calcium 8.3 (L) 8.9 - 10.3 mg/dL   Total Protein 6.3 (L) 6.5 - 8.1 g/dL   Albumin 2.8 (L) 3.5 - 5.0 g/dL   AST 59 (H) 15 - 41 U/L   ALT 37 0 - 44 U/L   Alkaline Phosphatase 52 38 - 126 U/L   Total Bilirubin 0.5 0.3 - 1.2 mg/dL   GFR, Estimated >60 >60 mL/min    Comment: (NOTE) Calculated using the CKD-EPI Creatinine Equation (2021)    Anion gap 11 5 - 15    Comment: Performed at [facility], [place] [address], [place], [place] [address]  Magnesium     Status: None   Collection Time: 01/30/22 11:15 PM  Result Value Ref Range   Magnesium 1.7 1.7 - 2.4 mg/dL    Comment: Performed at [facility] [address] [place], [place] [address]  Procalcitonin     Status: None   Collection Time: 01/30/22 11:15 PM  Result Value Ref Range   Procalcitonin 0.21 ng/mL    Comment:        Interpretation: PCT (Procalcitonin) <= 0.5 ng/mL: Systemic infection (sepsis) is not likely. Local bacterial infection is possible. (NOTE)       Sepsis PCT Algorithm           Lower Respiratory Tract                                      Infection PCT Algorithm    ----------------------------     ----------------------------         PCT < 0.25 ng/mL                PCT < 0.10 ng/mL          Strongly encourage             Strongly discourage   discontinuation of antibiotics    initiation of antibiotics    ----------------------------     -----------------------------       PCT 0.25 - 0.50 ng/mL            PCT 0.10 - 0.25 ng/mL               OR       >80% decrease in PCT             Discourage initiation of                                            antibiotics      Encourage discontinuation  of antibiotics    ----------------------------     -----------------------------         PCT >= 0.50 ng/mL              PCT 0.26 - 0.50 ng/mL               AND        <80% decrease in PCT             Encourage initiation of                                             antibiotics       Encourage continuation           of antibiotics    ----------------------------     -----------------------------        PCT >= 0.50 ng/mL                  PCT > 0.50 ng/mL               AND         increase in PCT                  Strongly encourage                                      initiation of antibiotics    Strongly encourage escalation           of antibiotics                                     -----------------------------                                           PCT <= 0.25 ng/mL                                                 OR                                        > 80% decrease in PCT                                      Discontinue / Do not initiate                                             antibiotics  Performed at [facility], [address], [place], [place] [address]   Lactic acid, plasma     Status: None   Collection Time: 01/30/22 11:15 PM  Result Value Ref Range   Lactic Acid, Venous 1.6 0.5 - 1.9  mmol/L    Comment: Performed at [facility], [place] [address] [place], [place] [address]  Brain natriuretic peptide     Status: Abnormal   Collection Time: 01/30/22 11:15 PM  Result Value Ref Range   B Natriuretic Peptide 196.7 (H) 0.0 - 100.0 pg/mL    Comment: Performed at [facility] [address] [place], [place] [address]  CBC     Status: Abnormal   Collection Time: 01/30/22 11:15 PM  Result Value Ref Range   WBC 15.7 (H) 4.0 - 10.5 K/uL   RBC 3.61 (L) 3.87 - 5.11 MIL/uL   Hemoglobin 6.8 (LL) 12.0 - 15.0 g/dL     Comment: REPEATED TO VERIFY Reticulocyte Hemoglobin testing may be clinically indicated, consider ordering this additional test [id number] THIS CRITICAL RESULT HAS VERIFIED AND BEEN CALLED TO ALEXIS LEWIS, RN BY HAYLEE HOWARD ON 06 02 2023 AT 2341, AND HAS BEEN READ BACK.     HCT 23.9 (L) 36.0 - 46.0 %   MCV 66.2 (L) 80.0 - 100.0 fL   MCH 18.8 (L) 26.0 - 34.0 pg   MCHC 28.5 (L) 30.0 - 36.0 g/dL   RDW 20.3 (H) 11.5 - 15.5 %   Platelets 447 (H) 150 - 400 K/uL   nRBC 0.0 0.0 - 0.2 %    Comment: Performed at [place] [address], [place], [place] [address]  Troponin I (High Sensitivity)     Status: Abnormal   Collection Time: 01/30/22 11:15 PM  Result Value Ref Range   Troponin I (High Sensitivity) 802 (HH) <18 ng/L    Comment: CRITICAL RESULT CALLED TO, READ BACK BY AND VERIFIED WITH: SCHMUTZ K,RN 01/30/22 2359 WAYK (NOTE) Elevated high sensitivity troponin I (hsTnI) values and significant  changes across serial measurements may suggest ACS but many other  chronic and acute conditions are known to elevate hsTnI results.  Refer to the Links section for chest pain algorithms and additional  guidance. Performed at [facility], [place] [address] [place], [place] [address]   Salicylate level     Status: Abnormal   Collection Time: 01/30/22 11:15 PM  Result Value Ref Range   Salicylate Lvl <8.6 (L) 7.0 - 30.0 mg/dL    Comment: Performed at [place] [address], [place], [place] [address]  Hemoglobin A1c     Status: None   Collection Time: 01/30/22 11:15 PM  Result Value Ref Range   Hgb A1c MFr Bld 5.5 4.8 - 5.6 %    Comment: (NOTE) Pre diabetes:          5.7%-6.4%  Diabetes:              >6.4%  Glycemic control for   <7.0% adults with diabetes    Mean Plasma Glucose 111.15 mg/dL    Comment: Performed at [facility] [address], [place], [place] [address]  Glucose, capillary     Status: Abnormal   Collection Time: 01/30/22 11:45 PM   Result Value Ref Range   Glucose-Capillary 132 (H) 70 - 99 mg/dL    Comment: Glucose reference range applies only to samples taken after fasting for at least 8 hours.  Prepare RBC (crossmatch)     Status: None   Collection Time: 01/30/22 11:55 PM  Result Value Ref Range   Order Confirmation      ORDER PROCESSED BY BLOOD BANK Performed at [facility], [place],  [place] [address]   Lactic acid, plasma     Status: None   Collection Time: 01/31/22 12:30 AM  Result Value Ref Range   Lactic Acid, Venous 1.5 0.5 - 1.9 mmol/L    Comment: Performed at [facility] [address] [place], [place] [address]  Troponin I (High Sensitivity)     Status: Abnormal   Collection Time: 01/31/22 12:30 AM  Result Value Ref Range   Troponin I (High Sensitivity) 762 (HH) <18 ng/L    Comment: CRITICAL VALUE NOTED.  VALUE IS CONSISTENT WITH PREVIOUSLY REPORTED AND CALLED VALUE. (NOTE) Elevated high sensitivity troponin I (hsTnI) values and significant  changes across serial measurements may suggest ACS but many other  chronic and acute conditions are known to elevate hsTnI results.  Refer to the Links section for chest pain algorithms and additional  guidance. Performed at [facility], [place] [address] [place], [place] [address]   Glucose, capillary     Status: Abnormal   Collection Time: 01/31/22  3:41 AM  Result Value Ref Range   Glucose-Capillary 155 (H) 70 - 99 mg/dL    Comment: Glucose reference range applies only to samples taken after fasting for at least 8 hours.  Basic metabolic panel     Status: Abnormal   Collection Time: 01/31/22  6:30 AM  Result Value Ref Range   Sodium 133 (L) 135 - 145 mmol/L   Potassium 4.3 3.5 - 5.1 mmol/L   Chloride 105 98 - 111 mmol/L   CO2 20 (L) 22 - 32 mmol/L   Glucose, Bld 135 (H) 70 - 99 mg/dL    Comment: Glucose reference range applies only to samples taken after fasting for at least 8 hours.   BUN 23 8 - 23 mg/dL    Creatinine, Ser 0.58 0.44 - 1.00 mg/dL   Calcium 8.3 (L) 8.9 - 10.3 mg/dL   GFR, Estimated >60 >60 mL/min    Comment: (NOTE) Calculated using the CKD-EPI Creatinine Equation (2021)    Anion gap 8 5 - 15    Comment: Performed at [facility] [address], [place], [place] [address]  CK     Status: Abnormal   Collection Time: 01/31/22  6:30 AM  Result Value Ref Range   Total CK 691 (H) 38 - 234 U/L    Comment: Performed at [facility], [place] [address], [place], [place] [address]  Magnesium     Status: None   Collection Time: 01/31/22  6:30 AM  Result Value Ref Range   Magnesium 2.1 1.7 - 2.4 mg/dL    Comment: Performed at [place] [address] [place], [place] [address]  Glucose, capillary     Status: Abnormal   Collection Time: 01/31/22  7:59 AM  Result Value Ref Range   Glucose-Capillary 132 (H) 70 - 99 mg/dL    Comment: Glucose reference range applies only to samples taken after fasting for at least 8 hours.    DG Chest 1 View  Result Date: 01/30/2022 CLINICAL DATA:  Altered mental status.  Fall. EXAM: CHEST  1 VIEW COMPARISON:  None available FINDINGS: Cardiac silhouette and mediastinal contours within normal limits. The lungs are clear. No pleural effusion or pneumothorax. Mild dextrocurvature of the midthoracic spine with moderate multilevel degenerative disc changes. IMPRESSION: No active disease. Electronically Signed   By: Yvonne Kendall M.D.   On: 01/30/2022 14:08   DG Lumbar Spine Complete  Result Date: 01/30/2022  CLINICAL DATA:  Fall.  Pain. EXAM: LUMBAR SPINE - COMPLETE 4+ VIEW COMPARISON:  None available FINDINGS: There are 5 non-rib-bearing lumbar-type vertebral bodies. Minimal levocurvature centered at L2-3 with moderate right L2-3 disc space narrowing, endplate sclerosis, vacuum phenomenon, and peripheral osteophytosis. Mild right L3-4 and severe left L4-5 disc space narrowing. Large left lateral L4-5 endplate osteophytes. No sagittal  spondylolisthesis. Vertebral body heights are maintained. Severe left femoroacetabular osteoarthritis with moderate to high-grade superior femoral head bone loss. IMPRESSION: Moderate multilevel degenerative disc and endplate changes greatest at the left L4-5 diffuse L5-S1 and right L2-3 levels. Electronically Signed   By: Yvonne Kendall M.D.   On: 01/30/2022 14:11   CT Head Wo Contrast  Result Date: 01/30/2022 CLINICAL DATA:  Provided history: Mental status change, unknown cause. Additional history provided: Mental status change, sepsis EXAM: CT HEAD WITHOUT CONTRAST TECHNIQUE: Contiguous axial images were obtained from the base of the skull through the vertex without intravenous contrast. RADIATION DOSE REDUCTION: This exam was performed according to the departmental dose-optimization program which includes automated exposure control, adjustment of the mA and/or kV according to patient size and/or use of iterative reconstruction technique. COMPARISON:  None. FINDINGS: Brain: Mild generalized cerebral atrophy. Large subtly hyperdense irregular mass centered in the right basal ganglia, right insula and adjacent right frontotemporal lobes. Measurements are difficult to provide due to the irregularity and ill-defined margins of the mass. Severe surrounding edema within the right cerebral hemisphere. Mass effect with near complete effacement of the right lateral ventricle and 11 mm leftward midline shift measured at the level of the septum pellucidum. Background patchy and ill-defined hypoattenuation within the cerebral white matter, nonspecific but compatible with chronic small vessel ischemic disease. There is no acute intracranial hemorrhage. No demarcated cortical infarct. No extra-axial fluid collection. Vascular: No hyperdense vessel.  Atherosclerotic calcifications. Skull: No fracture or aggressive osseous lesion. Sinuses/Orbits: No mass or acute finding within the imaged orbits. Frothy secretions and small  fluid level within the right sphenoid sinus. These results were called by telephone at the time of interpretation on 01/30/2022 at 2:30 pm to provider Sherwood Gambler , who verbally acknowledged these results. IMPRESSION: Large subtly hyperdense and irregular mass centered in the right basal ganglia, right insula and adjacent right frontotemporal lobes. Severe surrounding edema within the right cerebral hemisphere. Resultant mass effect with near complete effacement of the right lateral ventricle and 11 mm leftward midline shift. 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 A brain MRI without and with contrast is recommended. Neurosurgical consultation is also recommended. Background chronic small ischemic changes within the cerebral white matter. Mild generalized cerebral atrophy. Right sphenoid sinusitis. Electronically Signed   By: Kellie Simmering D.O.   On: 01/30/2022 14:36   CT Angio Chest PE W and/or Wo Contrast  Result Date: 01/30/2022 CLINICAL DATA:  Altered mental status and sepsis. * Tracking Code: BO * EXAM: CT ANGIOGRAPHY CHEST CT ABDOMEN AND PELVIS WITH CONTRAST TECHNIQUE: Multidetector CT imaging of the chest was performed using the standard protocol during bolus administration of intravenous contrast. Multiplanar CT image reconstructions and MIPs were obtained to evaluate the vascular anatomy. Multidetector CT imaging of the abdomen and pelvis was performed using the standard protocol during bolus administration of intravenous contrast. RADIATION DOSE REDUCTION: This exam was performed according to the departmental dose-optimization program which includes automated exposure control, adjustment of the mA and/or kV according to patient size and/or use of iterative reconstruction technique. CONTRAST:  179m OMNIPAQUE  IOHEXOL 350 MG/ML SOLN COMPARISON:  Abdomen evaluation of the same date. FINDINGS: CTA CHEST FINDINGS Cardiovascular: There is thrombus in the RIGHT  lower lobe artery with extension into RIGHT middle lobe branches, at the segmental level. There is also thrombus in RIGHT lower lobe branches at the central segmental level. Near occlusive thrombus to posterior RIGHT lower lobe branches. Central segmental involvement of all upper lobe branches on the RIGHT as well. Similar pattern of thrombosis in the LEFT chest also with lower lobe level, lobar level embolus perhaps bridging into upper lobe branches and filling lower lobe branches as well, lobar and segmental level emboli throughout the LEFT chest. There are multiple segmental level thrombi in the LEFT lower lobe, lingular branch and upper lobe branches. RV to LV ratio is increased to between 1.24 and 1.54 depending on level of measurement. Marked straightening of the interventricular septum. No pericardial effusion or signs of pericardial thickening. Aorta with mild dilation but without aneurysmal caliber. No acute aortic process with classic three-vessel branching pattern. Mediastinum/Nodes: Esophagus is patulous and thickened distally up to nearly a cm greatest thickness above the GE junction which is short segment and or focal. No thoracic inlet adenopathy. No axillary lymphadenopathy. No hilar or mediastinal lymphadenopathy. Lungs/Pleura: There is no pneumothorax. No pleural effusion. No lobar consolidation. Airways are patent. Musculoskeletal: No acute bone finding. No destructive bone process. Spinal degenerative changes. Review of the MIP images confirms the above findings. CT ABDOMEN and PELVIS FINDINGS Hepatobiliary: Signs of periportal edema. No focal, suspicious hepatic lesion. Motion limited assessment of structures in the upper abdomen. No signs of biliary duct dilation. Pericholecystic stranding without substantial gallbladder distension and with cholelithiasis. Pancreas: Normal, without mass, inflammation or ductal dilatation. Spleen: Spleen is normal in size and contour. Adrenals/Urinary Tract:  Adrenal glands are normal. Striated nephrogram bilaterally. No focal, suspicious renal lesion. Urinary bladder under distended otherwise unremarkable. Stomach/Bowel: Distal esophageal thickening. No acute gastric or small bowel process. Normal appendix. Colon is largely decompressed with signs of diverticular changes of the sigmoid. Vascular/Lymphatic: Aortic atherosclerosis. No sign of aneurysm. Smooth contour of the IVC. There is no gastrohepatic or hepatoduodenal ligament lymphadenopathy. No retroperitoneal or mesenteric lymphadenopathy. No pelvic sidewall lymphadenopathy. Reproductive: Unremarkable by CT. Other: No ascites. Generalized mild stranding about the upper abdomen may be related to urinary tract infection. No pneumoperitoneum. Musculoskeletal: Severe LEFT hip degenerative changes. Degenerative changes of the spine. No acute or destructive bone process. Review of the MIP images confirms the above findings. IMPRESSION: 1. Positive for acute PE with CT evidence of right heart strain (RV/LV Ratio = between 1.28 and 1.54) consistent with at least submassive (intermediate risk) PE. The presence of right heart strain has been associated with an increased risk of morbidity and mortality. Please refer to the "Code PE Focused" order set in EPIC. 2. Cholelithiasis, pericholecystic stranding favored to be related to cardiogenic factors and or volume resuscitation. Could consider ultrasound as warranted for further assessment. No wall thickening or substantial distension. 3. CT findings of pyelonephritis most pronounced in the LEFT upper pole. 4. Esophagus is patulous and thickened distally up to nearly a cm greatest thickness above the GE junction which is short segment and or focal. Findings are suspicious for neoplasm, potentially related to severe esophagitis. 5. Severe LEFT hip degenerative changes. 6. Aortic atherosclerosis. Findings of pulmonary emboli and esophageal thickening were discussed with the  provider caring for the patient as outlined below. Critical Value/emergent results were called by telephone at the time of interpretation  on 01/30/2022 at 2:28 pm to provider Sherwood Gambler , who verbally acknowledged these results. Electronically Signed   By: Zetta Bills M.D.   On: 01/30/2022 14:29   CT ABDOMEN PELVIS W CONTRAST  Result Date: 01/30/2022 CLINICAL DATA:  Altered mental status and sepsis. * Tracking Code: BO * EXAM: CT ANGIOGRAPHY CHEST CT ABDOMEN AND PELVIS WITH CONTRAST TECHNIQUE: Multidetector CT imaging of the chest was performed using the standard protocol during bolus administration of intravenous contrast. Multiplanar CT image reconstructions and MIPs were obtained to evaluate the vascular anatomy. Multidetector CT imaging of the abdomen and pelvis was performed using the standard protocol during bolus administration of intravenous contrast. RADIATION DOSE REDUCTION: This exam was performed according to the departmental dose-optimization program which includes automated exposure control, adjustment of the mA and/or kV according to patient size and/or use of iterative reconstruction technique. CONTRAST:  143m OMNIPAQUE IOHEXOL 350 MG/ML SOLN COMPARISON:  Abdomen evaluation of the same date. FINDINGS: CTA CHEST FINDINGS Cardiovascular: There is thrombus in the RIGHT lower lobe artery with extension into RIGHT middle lobe branches, at the segmental level. There is also thrombus in RIGHT lower lobe branches at the central segmental level. Near occlusive thrombus to posterior RIGHT lower lobe branches. Central segmental involvement of all upper lobe branches on the RIGHT as well. Similar pattern of thrombosis in the LEFT chest also with lower lobe level, lobar level embolus perhaps bridging into upper lobe branches and filling lower lobe branches as well, lobar and segmental level emboli throughout the LEFT chest. There are multiple segmental level thrombi in the LEFT lower lobe, lingular  branch and upper lobe branches. RV to LV ratio is increased to between 1.24 and 1.54 depending on level of measurement. Marked straightening of the interventricular septum. No pericardial effusion or signs of pericardial thickening. Aorta with mild dilation but without aneurysmal caliber. No acute aortic process with classic three-vessel branching pattern. Mediastinum/Nodes: Esophagus is patulous and thickened distally up to nearly a cm greatest thickness above the GE junction which is short segment and or focal. No thoracic inlet adenopathy. No axillary lymphadenopathy. No hilar or mediastinal lymphadenopathy. Lungs/Pleura: There is no pneumothorax. No pleural effusion. No lobar consolidation. Airways are patent. Musculoskeletal: No acute bone finding. No destructive bone process. Spinal degenerative changes. Review of the MIP images confirms the above findings. CT ABDOMEN and PELVIS FINDINGS Hepatobiliary: Signs of periportal edema. No focal, suspicious hepatic lesion. Motion limited assessment of structures in the upper abdomen. No signs of biliary duct dilation. Pericholecystic stranding without substantial gallbladder distension and with cholelithiasis. Pancreas: Normal, without mass, inflammation or ductal dilatation. Spleen: Spleen is normal in size and contour. Adrenals/Urinary Tract: Adrenal glands are normal. Striated nephrogram bilaterally. No focal, suspicious renal lesion. Urinary bladder under distended otherwise unremarkable. Stomach/Bowel: Distal esophageal thickening. No acute gastric or small bowel process. Normal appendix. Colon is largely decompressed with signs of diverticular changes of the sigmoid. Vascular/Lymphatic: Aortic atherosclerosis. No sign of aneurysm. Smooth contour of the IVC. There is no gastrohepatic or hepatoduodenal ligament lymphadenopathy. No retroperitoneal or mesenteric lymphadenopathy. No pelvic sidewall lymphadenopathy. Reproductive: Unremarkable by CT. Other: No ascites.  Generalized mild stranding about the upper abdomen may be related to urinary tract infection. No pneumoperitoneum. Musculoskeletal: Severe LEFT hip degenerative changes. Degenerative changes of the spine. No acute or destructive bone process. Review of the MIP images confirms the above findings. IMPRESSION: 1. Positive for acute PE with CT evidence of right heart strain (RV/LV Ratio = between 1.28 and 1.54)  consistent with at least submassive (intermediate risk) PE. The presence of right heart strain has been associated with an increased risk of morbidity and mortality. Please refer to the "Code PE Focused" order set in EPIC. 2. Cholelithiasis, pericholecystic stranding favored to be related to cardiogenic factors and or volume resuscitation. Could consider ultrasound as warranted for further assessment. No wall thickening or substantial distension. 3. CT findings of pyelonephritis most pronounced in the LEFT upper pole. 4. Esophagus is patulous and thickened distally up to nearly a cm greatest thickness above the GE junction which is short segment and or focal. Findings are suspicious for neoplasm, potentially related to severe esophagitis. 5. Severe LEFT hip degenerative changes. 6. Aortic atherosclerosis. Findings of pulmonary emboli and esophageal thickening were discussed with the provider caring for the patient as outlined below. Critical Value/emergent results were called by telephone at the time of interpretation on 01/30/2022 at 2:28 pm to provider Sherwood Gambler , who verbally acknowledged these results. Electronically Signed   By: Zetta Bills M.D.   On: 01/30/2022 14:29   DG Hip Unilat W or Wo Pelvis 2-3 Views Left  Result Date: 01/30/2022 CLINICAL DATA:  Altered mental status.  Fall. EXAM: DG HIP (WITH OR WITHOUT PELVIS) 2-3V LEFT COMPARISON:  None Available. FINDINGS: There is diffuse decreased bone mineralization. Severe left femoroacetabular osteoarthritis including superior bone-on-bone contact,  extensive subchondral sclerosis and cystic change, and large peripheral osteophytes with moderate to high-grade superior femoral head and acetabular cortical flattening/bone loss/remodeling. Mild right femoroacetabular joint space narrowing and peripheral osteophytosis. The bilateral sacroiliac and pubic symphysis joint spaces are maintained. No definite acute fracture is seen. No dislocation. Moderate to severe left L4-5 disc space narrowing and peripheral degenerative osteophytosis. IMPRESSION: Severe left femoroacetabular osteoarthritis. No definite acute fracture. Electronically Signed   By: Yvonne Kendall M.D.   On: 01/30/2022 14:06    Review of Systems  Neurological:  Positive for weakness and headaches.  Blood pressure 113/73, pulse 83, temperature 97.7 F (36.5 C), temperature source Oral, resp. rate (!) 23, height '5\' 5"'$  (1.651 m), weight 69.5 kg, SpO2 93 %. Physical Exam Neurological:     Comments: Patient is awake and alert pupils are equal reactive cranial nerves are intact except for left central seventh. she is somewhat confused and dysarthric.  Slight left-sided weakness and apraxia otherwise 5 out of 5    Assessment/Plan: 66 year old with right-sided brain mass.  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  She does have significant pulmonary emboli creating right heart strain and that is the primary focus of her current treatment we will continue to work-up the brain mass and when patient is stabilized from her pulmonary emboli we will consider biopsy versus resection based on results of the MRI scan.  Elaina Hoops 01/31/2022, 9:17 AM

## 2022-01-31 NOTE — Progress Notes (Addendum)
Patient admitted to [facility]. Belongings with her include her purse and cell phone. She agreed for me to find her boyfriend Ken's number off of her phone: [phone]. Left a voicemail to inform him that patient had been transferred to [facility].

## 2022-02-01 ENCOUNTER — Inpatient Hospital Stay (HOSPITAL_COMMUNITY): Payer: Medicaid Other

## 2022-02-01 DIAGNOSIS — K2289 Other specified disease of esophagus: Secondary | ICD-10-CM

## 2022-02-01 DIAGNOSIS — G936 Cerebral edema: Secondary | ICD-10-CM | POA: Diagnosis not present

## 2022-02-01 DIAGNOSIS — G9389 Other specified disorders of brain: Secondary | ICD-10-CM | POA: Diagnosis not present

## 2022-02-01 DIAGNOSIS — K219 Gastro-esophageal reflux disease without esophagitis: Secondary | ICD-10-CM

## 2022-02-01 DIAGNOSIS — R933 Abnormal findings on diagnostic imaging of other parts of digestive tract: Secondary | ICD-10-CM

## 2022-02-01 DIAGNOSIS — I2699 Other pulmonary embolism without acute cor pulmonale: Secondary | ICD-10-CM | POA: Diagnosis not present

## 2022-02-01 DIAGNOSIS — R1319 Other dysphagia: Secondary | ICD-10-CM | POA: Diagnosis not present

## 2022-02-01 LAB — CBC
HCT: 26.2 % — ABNORMAL LOW (ref 36.0–46.0)
Hemoglobin: 8.2 g/dL — ABNORMAL LOW (ref 12.0–15.0)
MCH: 20.6 pg — ABNORMAL LOW (ref 26.0–34.0)
MCHC: 31.3 g/dL (ref 30.0–36.0)
MCV: 65.7 fL — ABNORMAL LOW (ref 80.0–100.0)
Platelets: 471 10*3/uL — ABNORMAL HIGH (ref 150–400)
RBC: 3.99 MIL/uL (ref 3.87–5.11)
RDW: 21.4 % — ABNORMAL HIGH (ref 11.5–15.5)
WBC: 16.6 10*3/uL — ABNORMAL HIGH (ref 4.0–10.5)
nRBC: 0.6 % — ABNORMAL HIGH (ref 0.0–0.2)

## 2022-02-01 LAB — GLUCOSE, CAPILLARY
Glucose-Capillary: 133 mg/dL — ABNORMAL HIGH (ref 70–99)
Glucose-Capillary: 134 mg/dL — ABNORMAL HIGH (ref 70–99)
Glucose-Capillary: 167 mg/dL — ABNORMAL HIGH (ref 70–99)
Glucose-Capillary: 173 mg/dL — ABNORMAL HIGH (ref 70–99)
Glucose-Capillary: 177 mg/dL — ABNORMAL HIGH (ref 70–99)
Glucose-Capillary: 188 mg/dL — ABNORMAL HIGH (ref 70–99)

## 2022-02-01 LAB — URINE CULTURE: Culture: <10000 — AB

## 2022-02-01 LAB — BASIC METABOLIC PANEL
Anion gap: 6 (ref 5–15)
BUN: 23 mg/dL (ref 8–23)
CO2: 21 mmol/L — ABNORMAL LOW (ref 22–32)
Calcium: 8.5 mg/dL — ABNORMAL LOW (ref 8.9–10.3)
Chloride: 106 mmol/L (ref 98–111)
Creatinine, Ser: 0.66 mg/dL (ref 0.44–1.00)
GFR, Estimated: >60 mL/min (ref >60)
Glucose, Bld: 180 mg/dL — ABNORMAL HIGH (ref 70–99)
Potassium: 3.7 mmol/L (ref 3.5–5.1)
Sodium: 133 mmol/L — ABNORMAL LOW (ref 135–145)

## 2022-02-01 LAB — HEPARIN LEVEL (UNFRACTIONATED)
Heparin Unfractionated: 0.32 IU/mL (ref 0.30–0.70)
Heparin Unfractionated: 0.39 IU/mL (ref 0.30–0.70)

## 2022-02-01 IMAGING — DX DG HIP (WITH OR WITHOUT PELVIS) 2-3V*L*
2 series · 3 of 3 positions shown · non-contrast
Comparison: None Available.

CLINICAL DATA: Acute left hip pain.

EXAM:
DG HIP (WITH OR WITHOUT PELVIS) 2-3V LEFT

[pelvis]
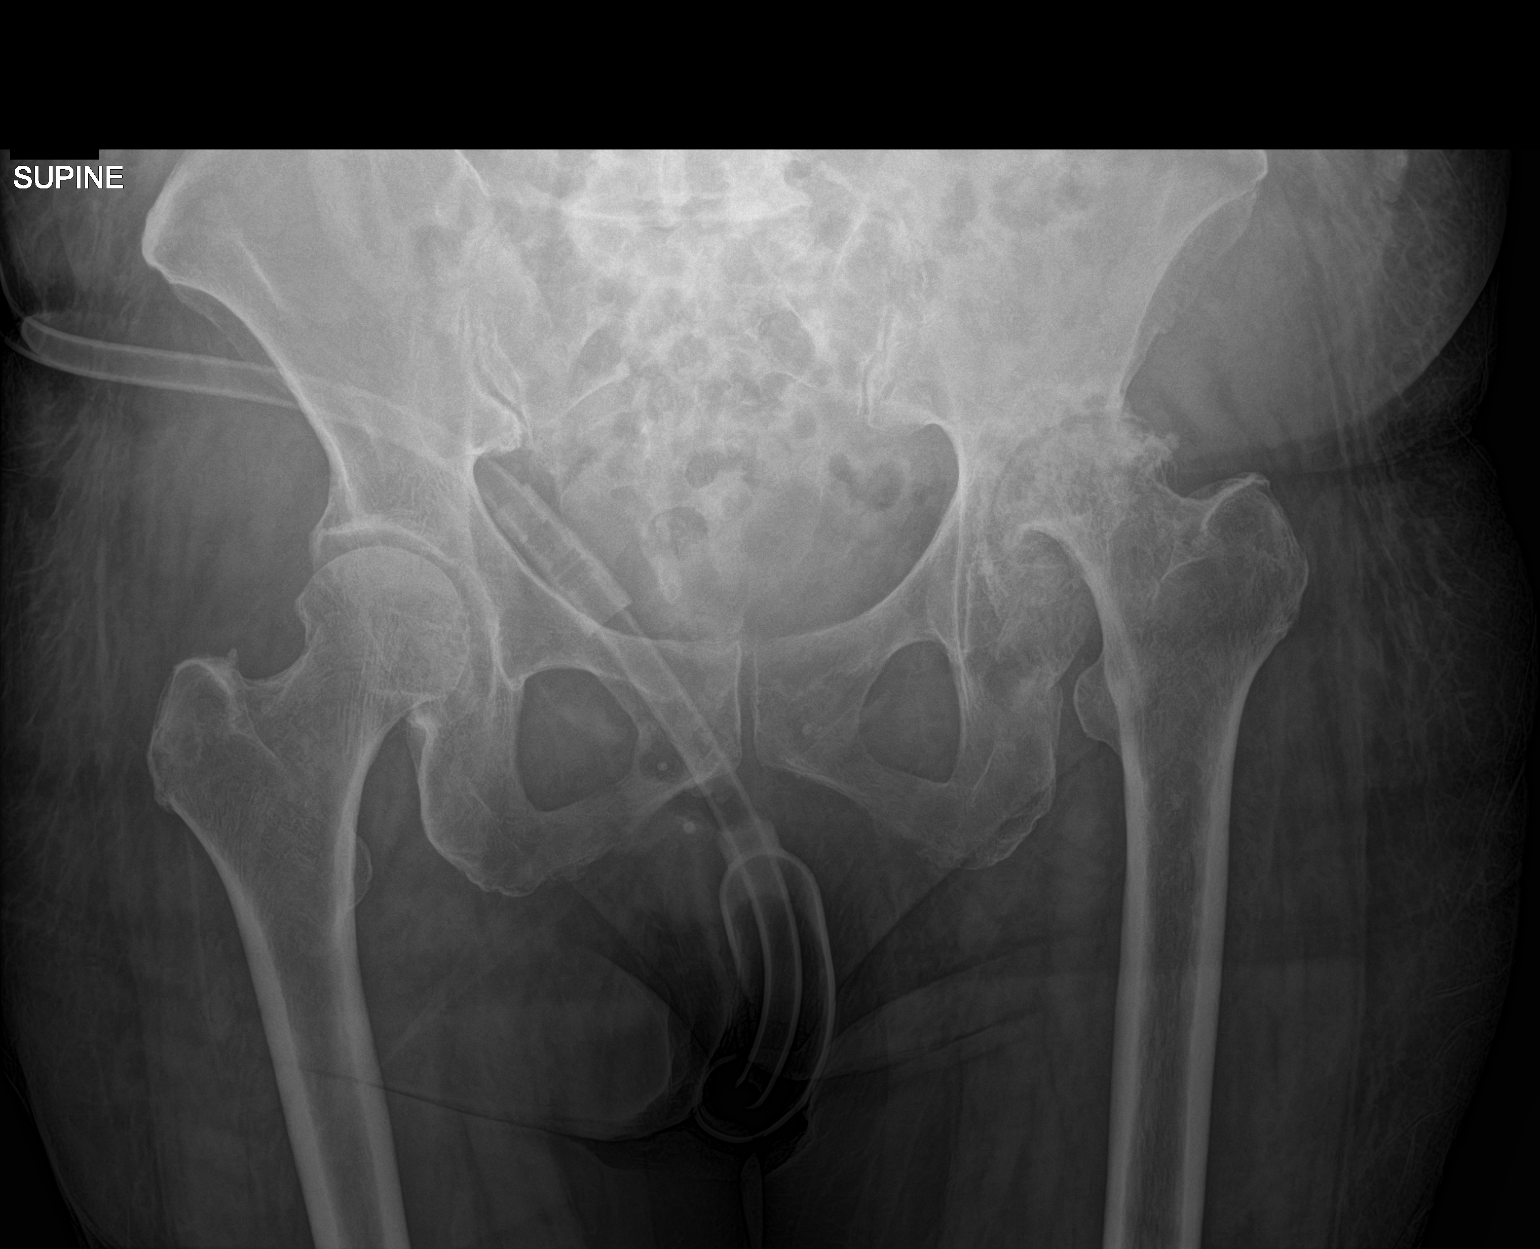

[Series 2: hip · 0.14mm/px · 2 of 2 slices shown]
[im 1/2]
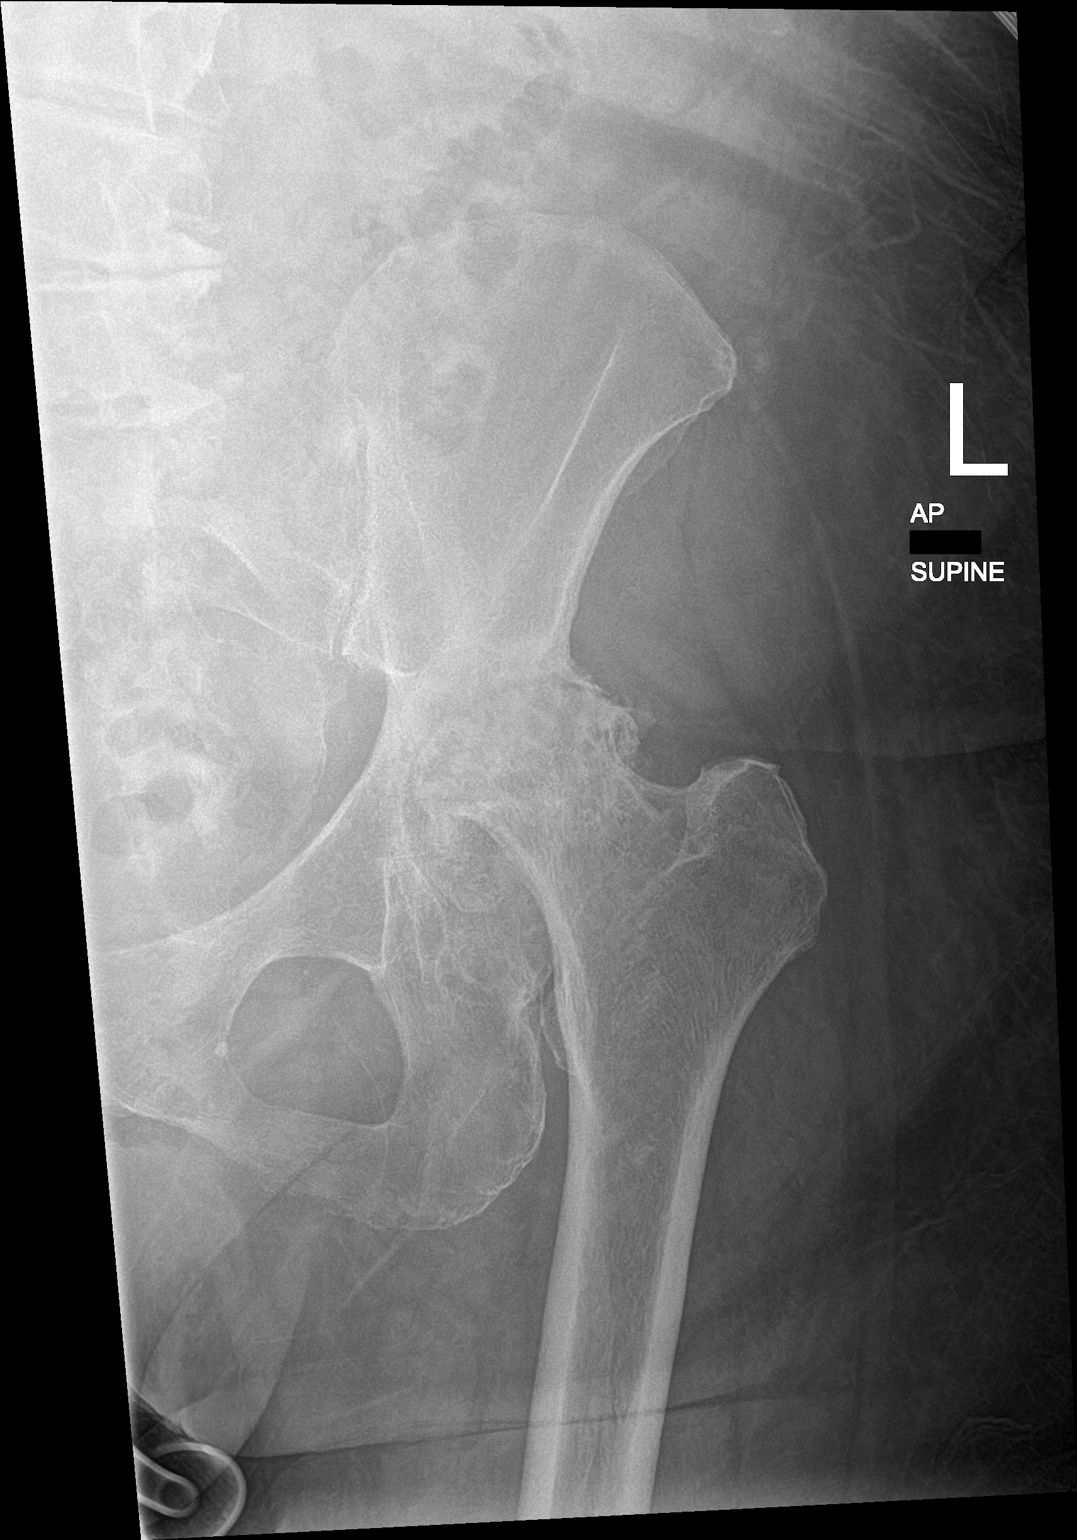
[im 2/2]
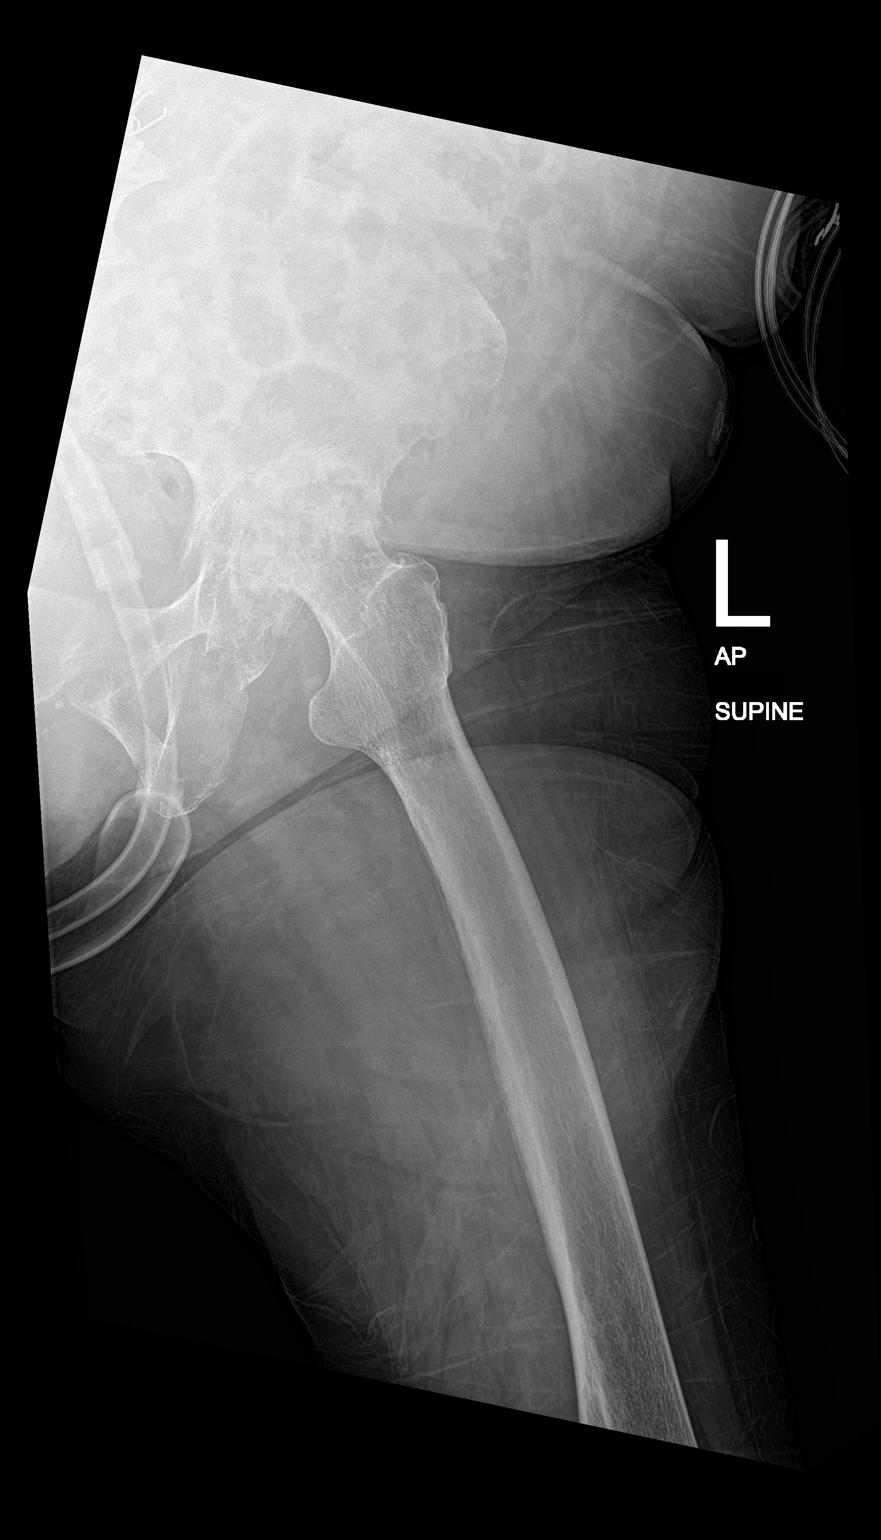

[3 of 3 positions shown; findings below may reference images not displayed]

FINDINGS: No evidence of left hip fracture or dislocation. Severe left hip
osteoarthritis is seen, with chronic collapse of the femoral head,
geode formation, and chronic bony remodeling.
IMPRESSION: No acute findings.

End-stage left hip osteoarthritis.

## 2022-02-01 IMAGING — DX DG KNEE 1-2V*L*
1 series · 2 of 2 positions shown · non-contrast
Comparison: None Available.

CLINICAL DATA: Acute left knee pain.  No known injury.

EXAM:
LEFT KNEE - 2 VIEW

[Series 1: knee · 0.14mm/px · 2 of 2 slices shown]
[im 1/2]
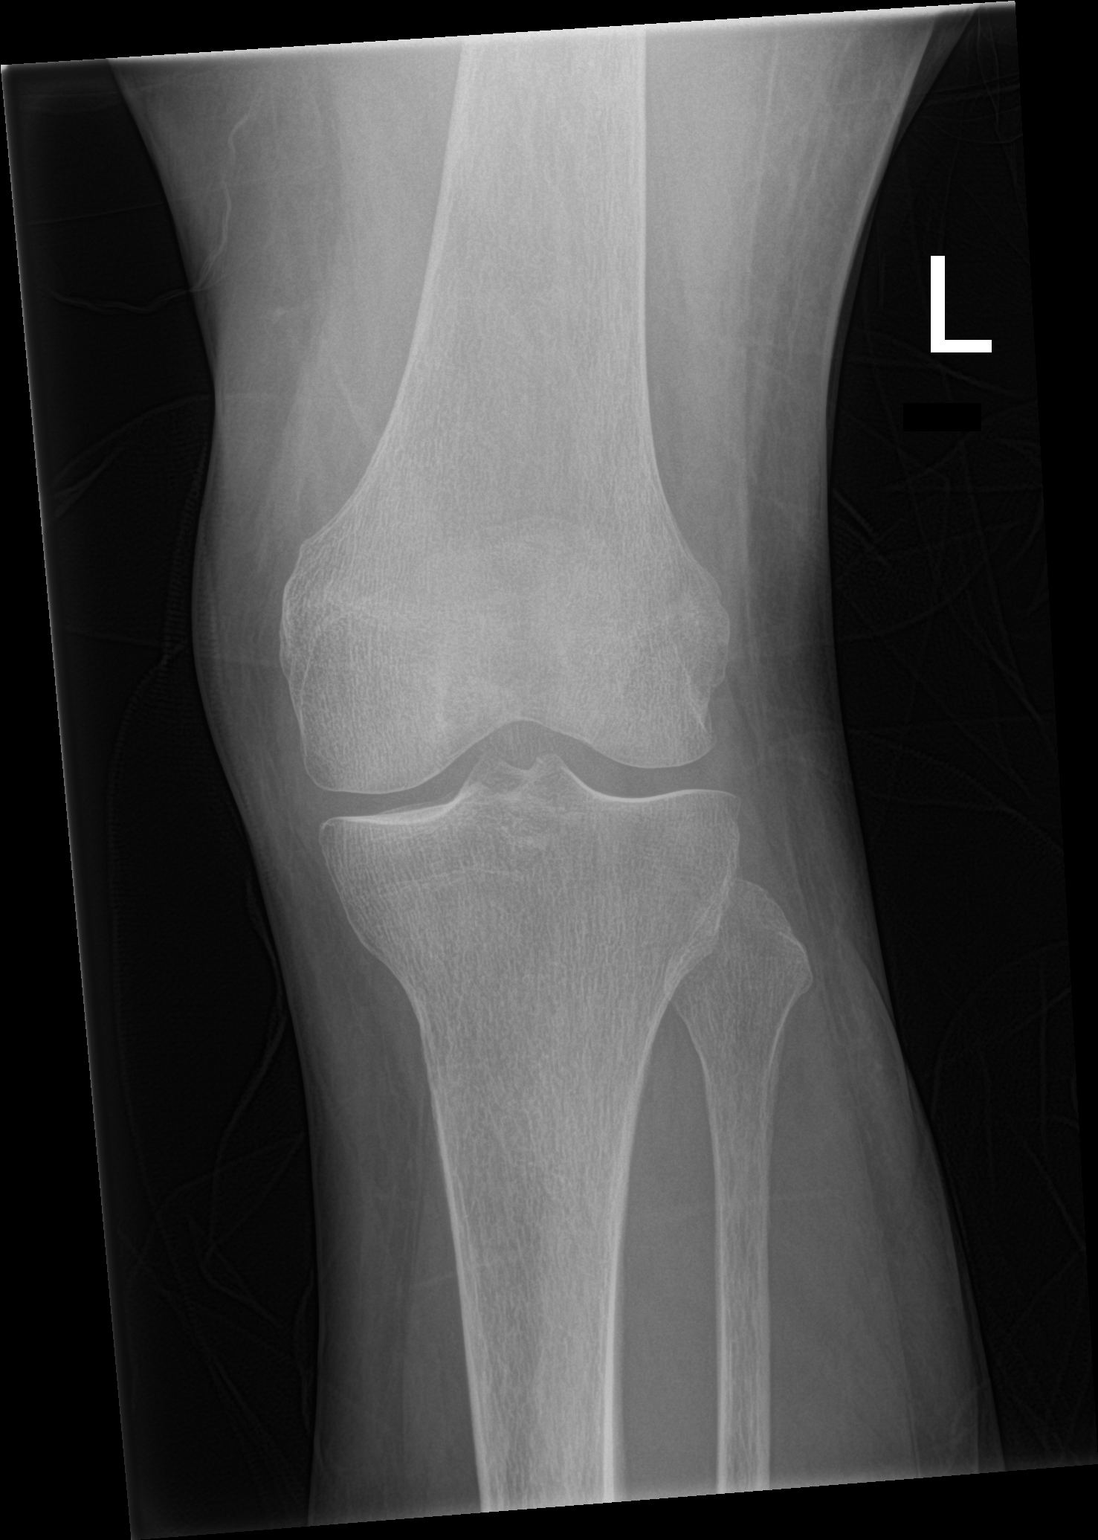
[im 2/2]
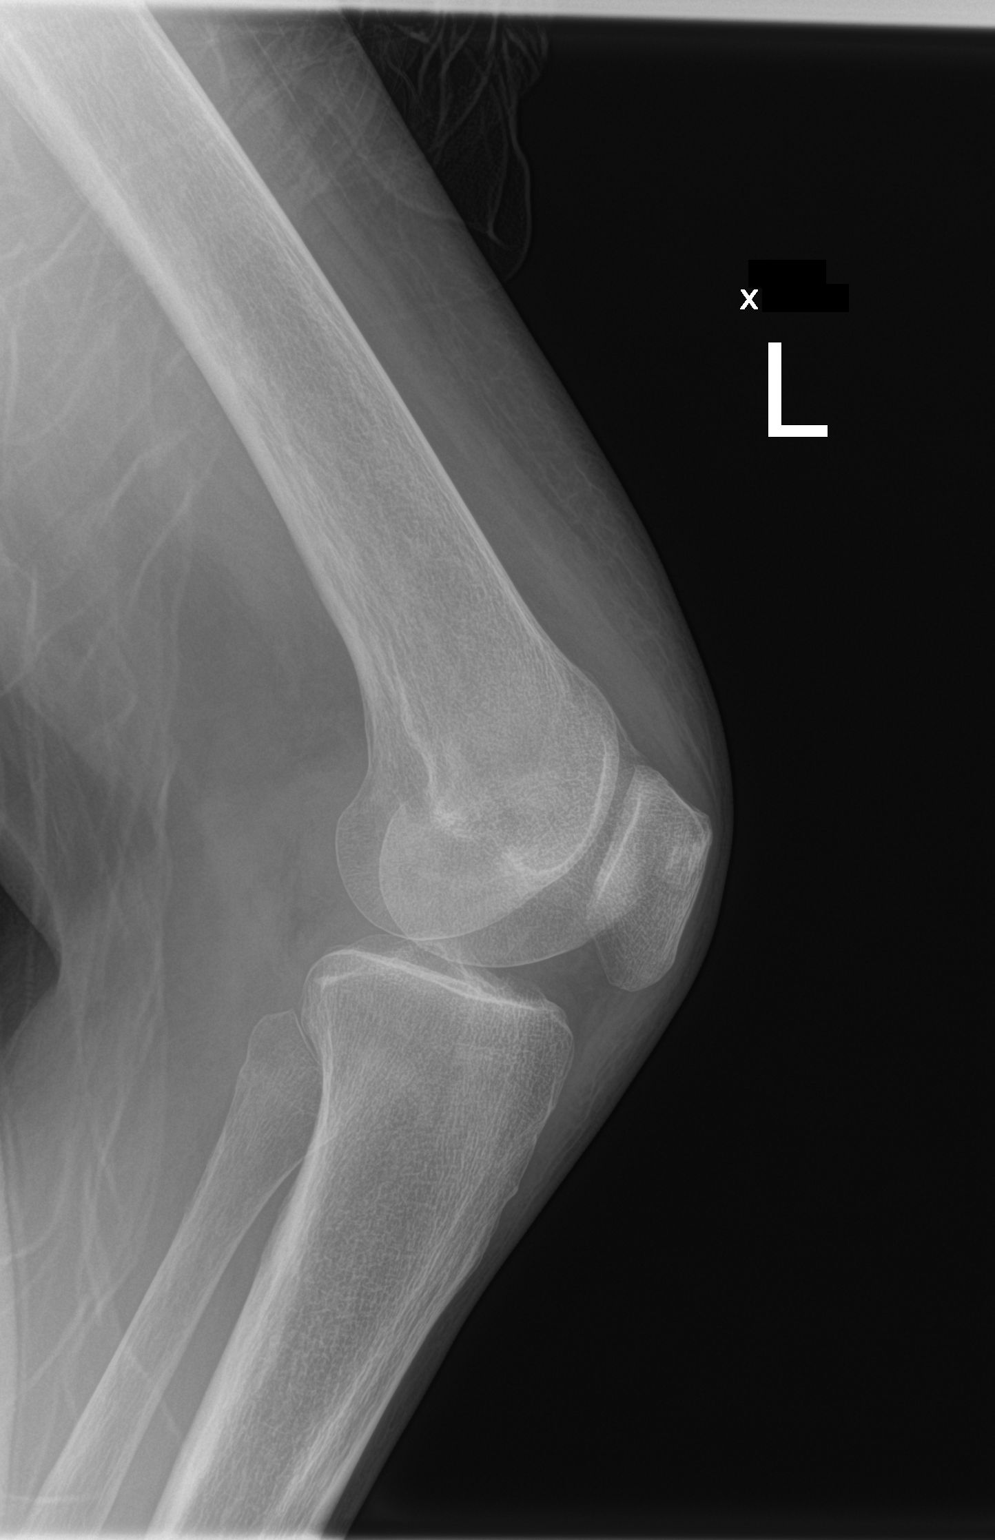

[2 of 2 positions shown; findings below may reference images not displayed]

FINDINGS: No evidence of fracture, dislocation, or joint effusion. No evidence
of arthropathy or other focal bone abnormality. Soft tissues are
unremarkable.
IMPRESSION: Negative.

## 2022-02-01 MED ORDER — CALCIUM CARBONATE ANTACID 500 MG PO CHEW
1.0000 | CHEWABLE_TABLET | Freq: Three times a day (TID) | ORAL | Status: DC | PRN
Start: 2022-02-01 — End: 2022-04-01
  Administered 2022-02-01 – 2022-03-03 (×16): 200 mg via ORAL
  Filled 2022-02-01 (×17): qty 1

## 2022-02-01 MED ORDER — LEVOFLOXACIN 500 MG PO TABS
500.0000 mg | ORAL_TABLET | Freq: Every day | ORAL | Status: DC
Start: 2022-02-01 — End: 2022-02-02
  Administered 2022-02-01: 500 mg via ORAL
  Filled 2022-02-01 (×2): qty 1

## 2022-02-01 MED ORDER — PANTOPRAZOLE SODIUM 40 MG PO TBEC
40.0000 mg | DELAYED_RELEASE_TABLET | Freq: Two times a day (BID) | ORAL | Status: DC
  Administered 2022-02-01: 40 mg via ORAL
  Filled 2022-02-01: qty 1

## 2022-02-01 NOTE — Progress Notes (Signed)
NEUROSURGERY PROGRESS NOTE  Doing well. Still confused at times. MRI was not completed because patient could not sit still long enough. Will need to complete this in the next day or two. Otherwise no new neurosurgical recommendations. MAE well, strength 5/5  Temp:  [97.5 F (36.4 C)-98.1 F (36.7 C)] 97.9 F (36.6 C) (06/04 0742) Pulse Rate:  [61-91] 71 (06/04 0600) Resp:  [16-27] 18 (06/04 0600) BP: (96-147)/(67-122) 120/86 (06/04 0000) SpO2:  [92 %-100 %] 96 % (06/04 0600) Weight:  [69.5 kg] 69.5 kg (06/03 1100)  Eleonore Chiquito, NP 02/01/2022 9:30 AM

## 2022-02-01 NOTE — Evaluation (Signed)
Clinical/Bedside Swallow Evaluation Patient Details  Name: Jennifer Pacheco MRN: [id number] Date of Birth: 05-28-1956  Today's Date: 02/01/2022 Time: SLP Start Time (ACUTE ONLY): 1519 SLP Stop Time (ACUTE ONLY): 1547 SLP Time Calculation (min) (ACUTE ONLY): 28 min  Past Medical History:  Past Medical History:  Diagnosis Date   Hard of hearing    Past Surgical History:  Past Surgical History:  Procedure Laterality Date   BREAST BIOPSY Left 2020   beg   HPI:  Patient is a 66 year old female presented to [facility] ED on 6/2 with AMS found down at home. Admitted and found to be hypoxic and hypotensive, CT chest showing submassive PE, CT abd and UA concerning for UTI w/ possible pyelonephritis. 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 PHMx: HTN, DMT2    Assessment / Plan / Recommendation  Clinical Impression  Pt participated in clinical swallowing assessment.  She was interactive, hyperverbal with confusion and poor self-regulation; had difficulty following commands. There was an inconsistent cough response throughout assessment that appeared to occur randomly, regardless of food/liquid texture and without reliability.  Suspect significant inattention is the primary issue, but there may be an underlying biomechanical deficit.  D/W RN - continue current regular diet, thin liquids; give meds whole in applesauce if pt will allow it.  SLP will f/u for safety/toleration. SLP Visit Diagnosis: Dysphagia, unspecified (R13.10)           Diet Recommendation   Regular solids, thin liquids  Medication Administration: Whole meds with puree    Other  Recommendations Oral Care Recommendations: Oral care BID    Recommendations for follow up therapy are one component of a multi-disciplinary discharge planning process, led by the attending physician.   Recommendations may be updated based on patient status, additional functional criteria and insurance authorization.  Follow up Recommendations Other (comment) (tba)      Assistance Recommended at Discharge Frequent or constant Supervision/Assistance  Functional Status Assessment Patient has had a recent decline in their functional status and demonstrates the ability to make significant improvements in function in a reasonable and predictable amount of time.  Frequency and Duration min 3x week  1 week       Prognosis Barriers to Reach Goals: Cognitive deficits      Swallow Study   General HPI: Patient is a 66 year old female presented to [facility] ED on 6/2 with AMS found down at home. Admitted and found to be hypoxic and hypotensive, CT chest showing submassive PE, CT abd and UA concerning for UTI w/ possible pyelonephritis. 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 PHMx: HTN, DMT2 Type of Study: Bedside Swallow Evaluation Previous Swallow Assessment: no Diet Prior to this Study: Regular;Thin liquids Temperature Spikes Noted: No Respiratory Status: Room air History of Recent Intubation: No Behavior/Cognition: Alert;Confused Oral Cavity Assessment: Within Functional Limits Oral Care Completed by SLP: No Oral Cavity - Dentition: Edentulous Vision: Functional for self-feeding Self-Feeding Abilities: Able to feed self Patient Positioning: Upright in chair Baseline Vocal Quality: Normal    Oral/Motor/Sensory Function Overall Oral Motor/Sensory Function: Within functional limits   Ice Chips Ice chips: Within functional limits   Thin Liquid Thin Liquid: Impaired Presentation: Cup;Straw Pharyngeal  Phase Impairments: Cough - Delayed  Nectar Thick Nectar Thick Liquid: Not tested   Honey Thick Honey Thick Liquid: Not tested   Puree  Puree: Impaired Presentation: Spoon Pharyngeal Phase Impairments: Cough - Delayed   Solid     Solid: Impaired Pharyngeal Phase Impairments: Cough - Delayed      Tivis Ringer, Jozalynn Noyce Laurice 02/01/2022,3:56 PM  [place] Meraz L. Tivis Ringer, [place] Office number [phone] Pager [phone]

## 2022-02-01 NOTE — Progress Notes (Signed)
PROGRESS NOTE                                                                                                                                                                                                             Patient Demographics:    Jennifer Pacheco, is a 66 y.o. 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   Subjective:    Marlyce Huge today has, No headache, No chest pain, No abdominal pain - No Nausea, No new weakness tingling or numbness, no shortness of breath, some left hip pain.   Assessment  & Plan :   Submassive PE.  Currently stable on heparin drip.  Echo reassuring.  Hemodynamically stable and not hypoxic.  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  Neurosurgery on board.  Continue IV Decadron and supportive care.  Will eventually require biopsy well but will defer this to neurosurgery, currently hemodynamically stable for biopsy..  Left-sided pyelonephritis on CT scan.  Monitor cultures.  Currently on aztreonam which will be switched to Rocephin, monitor.   Esophageal thickening suspicious for malignancy.  IV PPI and GI input may require EGD.   Nonspecific  gallbladder thickening noted on CT scan.  Stable right upper quadrant ultrasound, no abdominal tenderness.  Monitor.  Left hip pain.  Work-up thus far suggestive of severe chronic left-sided osteoarthritis.  Will check x-rays to rule out a fracture.  Supportive care with PT OT.  Anemia of chronic disease.  Monitor.  ? DM type II.  On sliding scale, may add Levemir as she is on steroids, for  better glycemic control   Lab Results  Component Value Date   HGBA1C 5.5 01/30/2022   CBG (last 3)  Recent Labs    01/31/22 2314 02/01/22 0256 02/01/22 0731  GLUCAP 151* 177* 134*         Condition - Extremely Guarded  Family Communication  : No listed phone number or available information  Code Status : Full code  Consults  : PCCM, neurosurgery, GI  PUD Prophylaxis : PPI.   Procedures  :     TTE -  1. Left ventricular ejection fraction, by estimation, is 60 to 65%. The left ventricle has normal function. Left ventricular endocardial border not optimally defined to evaluate regional wall motion. Left ventricular diastolic function could not be evaluated.  2. Right ventricular systolic function is normal. The right ventricular size is not well visualized.  3. The mitral valve is normal in structure. Mild mitral valve regurgitation. No evidence of mitral stenosis.  4. The aortic valve is tricuspid. Aortic valve regurgitation is not visualized. No aortic stenosis is present.  5. There is mild dilatation of the ascending aorta, measuring 39 mm. Conclusion(s)/Recommendation(s): Very limited study. Only parasternal long axis images obtained. Unable to reliably assess LV wall motion and confidently estimate RV function, but based on the data available, both ventricles appear to have normal contractility. Repeat study when patient is cooperative.  CT ABD Pelvis & CTA -  1. Positive for acute PE with CT evidence of right heart strain (RV/LV Ratio = between 1.28 and 1.54) consistent with at least  submassive (intermediate risk) PE. The presence of right heart strain has been associated with an increased risk of morbidity and mortality. Please refer to the "Code PE Focused" order set in EPIC. 2. Cholelithiasis, pericholecystic stranding favored to be related to cardiogenic factors and or volume resuscitation. Could consider ultrasound as warranted for further assessment. No wall thickening or substantial distension. 3. CT findings of pyelonephritis most pronounced in the LEFT upper pole. 4. Esophagus is patulous and thickened distally up to nearly a cm greatest thickness above the GE junction which is short segment and or focal. Findings are suspicious for neoplasm, potentially related to severe esophagitis. 5. Severe LEFT hip degenerative changes. 6. Aortic atherosclerosis.   RUQ [place] -  1. Cholelithiasis, mild pericholecystic fluid, and mild gallbladder wall thickening. No Murphy's sign. If the clinical picture remains ambiguous, a HIDA scan could further evaluate. 2. The common bile duct and liver are unremarkable.  MRI - Limited and abbreviated study because of the patient's condition and patient motion. The study confirms the presence of an approximately 5.2 x 5.3 cm mass on the right. I think this could be intra-axial, probably arising from the temporal insula, but I think there is some potential that it could be an extra-axial mass. No evidence of necrosis or pronounced heterogeneity. Mass effect with right-to-left shift 11 mm. Vasogenic edema within the right hemisphere. Chronic small-vessel ischemic changes of the cerebral hemispheric white matter.      Disposition Plan  :    Status is: Inpatient  DVT Prophylaxis  :  Hep gtt    Lab Results  Component Value Date   PLT 471 (H) 02/01/2022    Diet :  Diet Order             Diet heart healthy/carb modified Room service appropriate? Yes; Fluid consistency: Thin  Diet effective now  Inpatient  Medications  Scheduled Meds:  Chlorhexidine Gluconate Cloth  6 each Topical Q0600   dexamethasone (DECADRON) injection  10 mg Intravenous Q6H   insulin aspart  0-9 Units Subcutaneous Q4H   mouth rinse  15 mL Mouth Rinse BID   pantoprazole  40 mg Oral Daily   Continuous Infusions:  sodium chloride 10 mL/hr at 01/30/22 2141   aztreonam 200 mL/hr at 02/01/22 0600   heparin 1,100 Units/hr (02/01/22 0600)   PRN Meds:.acetaminophen, calcium carbonate, docusate sodium, ondansetron (ZOFRAN) IV, polyethylene glycol    Time Spent in minutes  30   Lala Lund M.D on 02/01/2022 at 8:56 AM  To page go to [email or web address]   [facility] -  Office  [phone]  See all Orders from today for further details    Objective:   Vitals:   02/01/22 0400 02/01/22 0500 02/01/22 0600 02/01/22 0742  BP:      Pulse: 76 61 71   Resp: (!) '27 19 18   '$ Temp:    97.9 F (36.6 C)  TempSrc:    Oral  SpO2: 100% 94% 96%   Weight:      Height:        Wt Readings from Last 3 Encounters:  01/31/22 69.5 kg  01/14/21 69.9 kg  07/15/20 73.3 kg     Intake/Output Summary (Last 24 hours) at 02/01/2022 0856 Last data filed at 02/01/2022 0650 Gross per 24 hour  Intake 1260.15 ml  Output 1101 ml  Net 159.15 ml     Physical Exam  Awake Alert, No new F.N deficits, Normal affect [place].AT,PERRAL Supple Neck, No JVD,   Symmetrical Chest wall movement, Good air movement bilaterally, CTAB RRR,No Gallops,Rubs or new Murmurs,  +ve B.Sounds, Abd Soft, No tenderness,   No Cyanosis, Clubbing or edema     RN pressure injury documentation: Pressure Injury 01/30/22 Sacrum Right;Medial Stage 2 -  Partial thickness loss of dermis presenting as a shallow open injury with a red, pink wound bed without slough. (Active)  01/30/22 2053  Location: Sacrum  Location Orientation: Right;Medial  Staging: Stage 2 -  Partial thickness loss of dermis presenting as a shallow open injury with a red, pink wound bed without  slough.  Wound Description (Comments):   Present on Admission: Yes  Dressing Type Foam - Lift dressing to assess site every shift 01/31/22 2000     Pressure Injury 01/30/22 Coccyx Lower;Medial Stage 2 -  Partial thickness loss of dermis presenting as a shallow open injury with a red, pink wound bed without slough. (Active)  01/30/22 2053  Location: Coccyx  Location Orientation: Lower;Medial  Staging: Stage 2 -  Partial thickness loss of dermis presenting as a shallow open injury with a red, pink wound bed without slough.  Wound Description (Comments):   Present on Admission: Yes  Dressing Type Foam - Lift dressing to assess site every shift 01/31/22 2000     Data Review:    CBC Recent Labs  Lab 01/30/22 1206 01/30/22 1211 01/30/22 2315 01/31/22 1025 02/01/22 0248  WBC  --  23.7* 15.7* 13.5* 16.6*  HGB 10.2* 7.9* 6.8* 8.1* 8.2*  HCT 30.0* 27.4* 23.9* 25.8* 26.2*  PLT  --  578* 447* 452* 471*  MCV  --  66.5* 66.2* 66.5* 65.7*  MCH  --  19.2* 18.8* 20.9* 20.6*  MCHC  --  28.8* 28.5* 31.4 31.3  RDW  --  20.8* 20.3* 21.3* 21.4*  LYMPHSABS  --  1.0  --   --   --  MONOABS  --  1.6*  --   --   --   EOSABS  --  0.0  --   --   --   BASOSABS  --  0.1  --   --   --     Electrolytes Recent Labs  Lab 01/30/22 1206 01/30/22 1211 01/30/22 1404 01/30/22 2315 01/31/22 0030 01/31/22 0630 02/01/22 0248  NA 136 135  --  135  --  133* 133*  K 3.2* 3.2*  --  3.7  --  4.3 3.7  CL 102 104  --  105  --  105 106  CO2  --  19*  --  19*  --  20* 21*  GLUCOSE 209* 209*  --  137*  --  135* 180*  BUN 34* 38*  --  26*  --  23 23  CREATININE 0.90 0.95  --  0.66  --  0.58 0.66  CALCIUM  --  8.6*  --  8.3*  --  8.3* 8.5*  AST  --  69*  --  59*  --   --   --   ALT  --  35  --  37  --   --   --   ALKPHOS  --  60  --  52  --   --   --   BILITOT  --  0.7  --  0.5  --   --   --   ALBUMIN  --  3.4*  --  2.8*  --   --   --   MG  --   --   --  1.7  --  2.1  --   PROCALCITON  --   --   --  0.21   --   --   --   LATICACIDVEN  --  3.9* 2.6* 1.6 1.5  --   --   INR  --  1.3*  --   --   --   --   --   HGBA1C  --   --   --  5.5  --   --   --   BNP  --   --   --  196.7*  --   --   --     ------------------------------------------------------------------------------------------------------------------ No results for input(s): CHOL, HDL, LDLCALC, TRIG, CHOLHDL, LDLDIRECT in the last 72 hours.  Lab Results  Component Value Date   HGBA1C 5.5 01/30/2022    Radiology Reports DG Chest 1 View  Result Date: 01/30/2022 CLINICAL DATA:  Altered mental status.  Fall. EXAM: CHEST  1 VIEW COMPARISON:  None available FINDINGS: Cardiac silhouette and mediastinal contours within normal limits. The lungs are clear. No pleural effusion or pneumothorax. Mild dextrocurvature of the midthoracic spine with moderate multilevel degenerative disc changes. IMPRESSION: No active disease. Electronically Signed   By: Yvonne Kendall M.D.   On: 01/30/2022 14:08   DG Lumbar Spine Complete  Result Date: 01/30/2022 CLINICAL DATA:  Fall.  Pain. EXAM: LUMBAR SPINE - COMPLETE 4+ VIEW COMPARISON:  None available FINDINGS: There are 5 non-rib-bearing lumbar-type vertebral bodies. Minimal levocurvature centered at L2-3 with moderate right L2-3 disc space narrowing, endplate sclerosis, vacuum phenomenon, and peripheral osteophytosis. Mild right L3-4 and severe left L4-5 disc space narrowing. Large left lateral L4-5 endplate osteophytes. No sagittal spondylolisthesis. Vertebral body heights are maintained. Severe left femoroacetabular osteoarthritis with moderate to high-grade superior femoral head bone loss. IMPRESSION: Moderate multilevel degenerative disc and endplate changes greatest at the left  L4-5 diffuse L5-S1 and right L2-3 levels. Electronically Signed   By: Yvonne Kendall M.D.   On: 01/30/2022 14:11   CT Head Wo Contrast  Result Date: 01/30/2022 CLINICAL DATA:  Provided history: Mental status change, unknown cause. Additional  history provided: Mental status change, sepsis EXAM: CT HEAD WITHOUT CONTRAST TECHNIQUE: Contiguous axial images were obtained from the base of the skull through the vertex without intravenous contrast. RADIATION DOSE REDUCTION: This exam was performed according to the departmental dose-optimization program which includes automated exposure control, adjustment of the mA and/or kV according to patient size and/or use of iterative reconstruction technique. COMPARISON:  None. FINDINGS: Brain: Mild generalized cerebral atrophy. Large subtly hyperdense irregular mass centered in the right basal ganglia, right insula and adjacent right frontotemporal lobes. Measurements are difficult to provide due to the irregularity and ill-defined margins of the mass. Severe surrounding edema within the right cerebral hemisphere. Mass effect with near complete effacement of the right lateral ventricle and 11 mm leftward midline shift measured at the level of the septum pellucidum. Background patchy and ill-defined hypoattenuation within the cerebral white matter, nonspecific but compatible with chronic small vessel ischemic disease. There is no acute intracranial hemorrhage. No demarcated cortical infarct. No extra-axial fluid collection. Vascular: No hyperdense vessel.  Atherosclerotic calcifications. Skull: No fracture or aggressive osseous lesion. Sinuses/Orbits: No mass or acute finding within the imaged orbits. Frothy secretions and small fluid level within the right sphenoid sinus. These results were called by telephone at the time of interpretation on 01/30/2022 at 2:30 pm to provider Sherwood Gambler , who verbally acknowledged these results. IMPRESSION: Large subtly hyperdense and irregular mass centered in the right basal ganglia, right insula and adjacent right frontotemporal lobes. Severe surrounding edema within the right cerebral hemisphere. Resultant mass effect with near complete effacement of the right lateral ventricle  and 11 mm leftward midline shift. 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 A brain MRI without and with contrast is recommended. Neurosurgical consultation is also recommended. Background chronic small ischemic changes within the cerebral white matter. Mild generalized cerebral atrophy. Right sphenoid sinusitis. Electronically Signed   By: Kellie Simmering D.O.   On: 01/30/2022 14:36   CT Angio Chest PE W and/or Wo Contrast  Result Date: 01/30/2022 CLINICAL DATA:  Altered mental status and sepsis. * Tracking Code: BO * EXAM: CT ANGIOGRAPHY CHEST CT ABDOMEN AND PELVIS WITH CONTRAST TECHNIQUE: Multidetector CT imaging of the chest was performed using the standard protocol during bolus administration of intravenous contrast. Multiplanar CT image reconstructions and MIPs were obtained to evaluate the vascular anatomy. Multidetector CT imaging of the abdomen and pelvis was performed using the standard protocol during bolus administration of intravenous contrast. RADIATION DOSE REDUCTION: This exam was performed according to the departmental dose-optimization program which includes automated exposure control, adjustment of the mA and/or kV according to patient size and/or use of iterative reconstruction technique. CONTRAST:  166m OMNIPAQUE IOHEXOL 350 MG/ML SOLN COMPARISON:  Abdomen evaluation of the same date. FINDINGS: CTA CHEST FINDINGS Cardiovascular: There is thrombus in the RIGHT lower lobe artery with extension into RIGHT middle lobe branches, at the segmental level. There is also thrombus in RIGHT lower lobe branches at the central segmental level. Near occlusive thrombus to posterior RIGHT lower lobe branches. Central segmental involvement of all upper lobe branches on the RIGHT as well. Similar pattern of thrombosis in the LEFT chest also with lower lobe level, lobar level embolus perhaps bridging into upper  lobe branches and filling lower lobe branches as well,  lobar and segmental level emboli throughout the LEFT chest. There are multiple segmental level thrombi in the LEFT lower lobe, lingular branch and upper lobe branches. RV to LV ratio is increased to between 1.24 and 1.54 depending on level of measurement. Marked straightening of the interventricular septum. No pericardial effusion or signs of pericardial thickening. Aorta with mild dilation but without aneurysmal caliber. No acute aortic process with classic three-vessel branching pattern. Mediastinum/Nodes: Esophagus is patulous and thickened distally up to nearly a cm greatest thickness above the GE junction which is short segment and or focal. No thoracic inlet adenopathy. No axillary lymphadenopathy. No hilar or mediastinal lymphadenopathy. Lungs/Pleura: There is no pneumothorax. No pleural effusion. No lobar consolidation. Airways are patent. Musculoskeletal: No acute bone finding. No destructive bone process. Spinal degenerative changes. Review of the MIP images confirms the above findings. CT ABDOMEN and PELVIS FINDINGS Hepatobiliary: Signs of periportal edema. No focal, suspicious hepatic lesion. Motion limited assessment of structures in the upper abdomen. No signs of biliary duct dilation. Pericholecystic stranding without substantial gallbladder distension and with cholelithiasis. Pancreas: Normal, without mass, inflammation or ductal dilatation. Spleen: Spleen is normal in size and contour. Adrenals/Urinary Tract: Adrenal glands are normal. Striated nephrogram bilaterally. No focal, suspicious renal lesion. Urinary bladder under distended otherwise unremarkable. Stomach/Bowel: Distal esophageal thickening. No acute gastric or small bowel process. Normal appendix. Colon is largely decompressed with signs of diverticular changes of the sigmoid. Vascular/Lymphatic: Aortic atherosclerosis. No sign of aneurysm. Smooth contour of the IVC. There is no gastrohepatic or hepatoduodenal ligament lymphadenopathy. No  retroperitoneal or mesenteric lymphadenopathy. No pelvic sidewall lymphadenopathy. Reproductive: Unremarkable by CT. Other: No ascites. Generalized mild stranding about the upper abdomen may be related to urinary tract infection. No pneumoperitoneum. Musculoskeletal: Severe LEFT hip degenerative changes. Degenerative changes of the spine. No acute or destructive bone process. Review of the MIP images confirms the above findings. IMPRESSION: 1. Positive for acute PE with CT evidence of right heart strain (RV/LV Ratio = between 1.28 and 1.54) consistent with at least submassive (intermediate risk) PE. The presence of right heart strain has been associated with an increased risk of morbidity and mortality. Please refer to the "Code PE Focused" order set in EPIC. 2. Cholelithiasis, pericholecystic stranding favored to be related to cardiogenic factors and or volume resuscitation. Could consider ultrasound as warranted for further assessment. No wall thickening or substantial distension. 3. CT findings of pyelonephritis most pronounced in the LEFT upper pole. 4. Esophagus is patulous and thickened distally up to nearly a cm greatest thickness above the GE junction which is short segment and or focal. Findings are suspicious for neoplasm, potentially related to severe esophagitis. 5. Severe LEFT hip degenerative changes. 6. Aortic atherosclerosis. Findings of pulmonary emboli and esophageal thickening were discussed with the provider caring for the patient as outlined below. Critical Value/emergent results were called by telephone at the time of interpretation on 01/30/2022 at 2:28 pm to provider Sherwood Gambler , who verbally acknowledged these results. Electronically Signed   By: Zetta Bills M.D.   On: 01/30/2022 14:29   MR BRAIN WO CONTRAST  Result Date: 01/31/2022 CLINICAL DATA:  Mental status change.  Follow-up brain tumor. EXAM: MRI HEAD WITHOUT CONTRAST TECHNIQUE: Multiplanar, multiecho pulse sequences of the  brain and surrounding structures were obtained without intravenous contrast. COMPARISON:  Head CT yesterday. FINDINGS: Brain: The patient was disoriented and would not hold still for scanning. Contrast enhanced imaging was not  achieved. The only coronal imaging achieve was the diffusion study. Medication or anesthesia may be necessary. The brainstem and cerebellum are normal. Both cerebral hemispheres show chronic appearing small vessel ischemic changes of the white matter. There is an approximately 5.2 x 5.3 cm mass lesion in the Peri insular brain on the right, exact site of origin not determined, but temporal lobe is favored. I would say there is some possibility that this could even be an extra-axial mass lesion. The surrounding brain shows pronounced vasogenic edema. There is mass effect with right-to-left shift of 11 mm. No hydrocephalus/ventricular trapping. Vascular: Major vessels at the base of the brain show flow. Skull and upper cervical spine: Negative Sinuses/Orbits: Clear/normal Other: None IMPRESSION: Limited and abbreviated study because of the patient's condition and patient motion. The study confirms the presence of an approximately 5.2 x 5.3 cm mass on the right. I think this could be intra-axial, probably arising from the temporal insula, but I think there is some potential that it could be an extra-axial mass. No evidence of necrosis or pronounced heterogeneity. Mass effect with right-to-left shift 11 mm. Vasogenic edema within the right hemisphere. Chronic small-vessel ischemic changes of the cerebral hemispheric white matter. Electronically Signed   By: Nelson Chimes M.D.   On: 01/31/2022 15:51   CT ABDOMEN PELVIS W CONTRAST  Result Date: 01/30/2022 CLINICAL DATA:  Altered mental status and sepsis. * Tracking Code: BO * EXAM: CT ANGIOGRAPHY CHEST CT ABDOMEN AND PELVIS WITH CONTRAST TECHNIQUE: Multidetector CT imaging of the chest was performed using the standard protocol during bolus  administration of intravenous contrast. Multiplanar CT image reconstructions and MIPs were obtained to evaluate the vascular anatomy. Multidetector CT imaging of the abdomen and pelvis was performed using the standard protocol during bolus administration of intravenous contrast. RADIATION DOSE REDUCTION: This exam was performed according to the departmental dose-optimization program which includes automated exposure control, adjustment of the mA and/or kV according to patient size and/or use of iterative reconstruction technique. CONTRAST:  125m OMNIPAQUE IOHEXOL 350 MG/ML SOLN COMPARISON:  Abdomen evaluation of the same date. FINDINGS: CTA CHEST FINDINGS Cardiovascular: There is thrombus in the RIGHT lower lobe artery with extension into RIGHT middle lobe branches, at the segmental level. There is also thrombus in RIGHT lower lobe branches at the central segmental level. Near occlusive thrombus to posterior RIGHT lower lobe branches. Central segmental involvement of all upper lobe branches on the RIGHT as well. Similar pattern of thrombosis in the LEFT chest also with lower lobe level, lobar level embolus perhaps bridging into upper lobe branches and filling lower lobe branches as well, lobar and segmental level emboli throughout the LEFT chest. There are multiple segmental level thrombi in the LEFT lower lobe, lingular branch and upper lobe branches. RV to LV ratio is increased to between 1.24 and 1.54 depending on level of measurement. Marked straightening of the interventricular septum. No pericardial effusion or signs of pericardial thickening. Aorta with mild dilation but without aneurysmal caliber. No acute aortic process with classic three-vessel branching pattern. Mediastinum/Nodes: Esophagus is patulous and thickened distally up to nearly a cm greatest thickness above the GE junction which is short segment and or focal. No thoracic inlet adenopathy. No axillary lymphadenopathy. No hilar or mediastinal  lymphadenopathy. Lungs/Pleura: There is no pneumothorax. No pleural effusion. No lobar consolidation. Airways are patent. Musculoskeletal: No acute bone finding. No destructive bone process. Spinal degenerative changes. Review of the MIP images confirms the above findings. CT ABDOMEN and PELVIS FINDINGS Hepatobiliary:  Signs of periportal edema. No focal, suspicious hepatic lesion. Motion limited assessment of structures in the upper abdomen. No signs of biliary duct dilation. Pericholecystic stranding without substantial gallbladder distension and with cholelithiasis. Pancreas: Normal, without mass, inflammation or ductal dilatation. Spleen: Spleen is normal in size and contour. Adrenals/Urinary Tract: Adrenal glands are normal. Striated nephrogram bilaterally. No focal, suspicious renal lesion. Urinary bladder under distended otherwise unremarkable. Stomach/Bowel: Distal esophageal thickening. No acute gastric or small bowel process. Normal appendix. Colon is largely decompressed with signs of diverticular changes of the sigmoid. Vascular/Lymphatic: Aortic atherosclerosis. No sign of aneurysm. Smooth contour of the IVC. There is no gastrohepatic or hepatoduodenal ligament lymphadenopathy. No retroperitoneal or mesenteric lymphadenopathy. No pelvic sidewall lymphadenopathy. Reproductive: Unremarkable by CT. Other: No ascites. Generalized mild stranding about the upper abdomen may be related to urinary tract infection. No pneumoperitoneum. Musculoskeletal: Severe LEFT hip degenerative changes. Degenerative changes of the spine. No acute or destructive bone process. Review of the MIP images confirms the above findings. IMPRESSION: 1. Positive for acute PE with CT evidence of right heart strain (RV/LV Ratio = between 1.28 and 1.54) consistent with at least submassive (intermediate risk) PE. The presence of right heart strain has been associated with an increased risk of morbidity and mortality. Please refer to the  "Code PE Focused" order set in EPIC. 2. Cholelithiasis, pericholecystic stranding favored to be related to cardiogenic factors and or volume resuscitation. Could consider ultrasound as warranted for further assessment. No wall thickening or substantial distension. 3. CT findings of pyelonephritis most pronounced in the LEFT upper pole. 4. Esophagus is patulous and thickened distally up to nearly a cm greatest thickness above the GE junction which is short segment and or focal. Findings are suspicious for neoplasm, potentially related to severe esophagitis. 5. Severe LEFT hip degenerative changes. 6. Aortic atherosclerosis. Findings of pulmonary emboli and esophageal thickening were discussed with the provider caring for the patient as outlined below. Critical Value/emergent results were called by telephone at the time of interpretation on 01/30/2022 at 2:28 pm to provider Sherwood Gambler , who verbally acknowledged these results. Electronically Signed   By: Zetta Bills M.D.   On: 01/30/2022 14:29   ECHOCARDIOGRAM COMPLETE  Result Date: 01/31/2022    ECHOCARDIOGRAM REPORT   Patient Name:   Jennifer Pacheco [facility] Date of Exam: 01/31/2022 Medical Rec #:  [id number]              Height:       65.0 in Accession #:    [id number]             Weight:       153.2 lb Date of Birth:  Dec 06, 1955               BSA:          1.766 m Patient Age:    3 years               BP:           123/82 mmHg Patient Gender: F                      HR:           77 bpm. Exam Location:  Inpatient Procedure: 2D Echo and Color Doppler Indications:    I26.02 Pulmonary embolus  History:        Patient has no prior history of Echocardiogram examinations.  Signs/Symptoms:Altered Mental Status. Brain mass. Shock.  Sonographer:    Roseanna Rainbow RDCS Referring Phys: [id number] Mick Sell  Sonographer Comments: Image acquisition challenging due to patient behavioral factors. and Image acquisition challenging due to uncooperative patient. Exam  ended because patient grabbed me twice and could not stay in position. Patient pushed probe away and rolled into right decubitus position despite my direction. IMPRESSIONS  1. Left ventricular ejection fraction, by estimation, is 60 to 65%. The left ventricle has normal function. Left ventricular endocardial border not optimally defined to evaluate regional wall motion. Left ventricular diastolic function could not be evaluated.  2. Right ventricular systolic function is normal. The right ventricular size is not well visualized.  3. The mitral valve is normal in structure. Mild mitral valve regurgitation. No evidence of mitral stenosis.  4. The aortic valve is tricuspid. Aortic valve regurgitation is not visualized. No aortic stenosis is present.  5. There is mild dilatation of the ascending aorta, measuring 39 mm. Conclusion(s)/Recommendation(s): Very limited study. Only parasternal long axis images obtained. Unable to reliably assess LV wall motion and confidently estimate RV function, but based on the data available, both ventricles appear to have normal contractility. Repeat study when patient is cooperative. FINDINGS  Left Ventricle: Left ventricular ejection fraction, by estimation, is 60 to 65%. The left ventricle has normal function. Left ventricular endocardial border not optimally defined to evaluate regional wall motion. The left ventricular internal cavity size was normal in size. There is no left ventricular hypertrophy. Left ventricular diastolic function could not be evaluated. Right Ventricle: The right ventricular size is not well visualized. Right vetricular wall thickness was not well visualized. Right ventricular systolic function is normal. Left Atrium: Left atrial size was normal in size. Right Atrium: Right atrial size was not well visualized. Pericardium: There is no evidence of pericardial effusion. Mitral Valve: The mitral valve is normal in structure. Mild mitral annular calcification.  Mild mitral valve regurgitation. No evidence of mitral valve stenosis. Tricuspid Valve: The tricuspid valve is normal in structure. Tricuspid valve regurgitation is not demonstrated. Aortic Valve: The aortic valve is tricuspid. Aortic valve regurgitation is not visualized. No aortic stenosis is present. Pulmonic Valve: The pulmonic valve was not well visualized. Aorta: The aortic root is normal in size and structure. There is mild dilatation of the ascending aorta, measuring 39 mm. IAS/Shunts: The interatrial septum was not assessed.  LEFT VENTRICLE PLAX 2D LVIDd:         3.90 cm LVIDs:         2.40 cm LV PW:         0.80 cm LV IVS:        0.70 cm LVOT diam:     2.20 cm LVOT Area:     3.80 cm  LEFT ATRIUM         Index LA diam:    2.70 cm 1.53 cm/m   AORTA Ao Root diam: 3.50 cm Ao Asc diam:  3.90 cm  SHUNTS Systemic Diam: 2.20 cm Dani Gobble Croitoru MD Electronically signed by Sanda Klein MD Signature Date/Time: 01/31/2022/9:41:28 AM    Final    DG Hip Unilat W or Wo Pelvis 2-3 Views Left  Result Date: 01/30/2022 CLINICAL DATA:  Altered mental status.  Fall. EXAM: DG HIP (WITH OR WITHOUT PELVIS) 2-3V LEFT COMPARISON:  None Available. FINDINGS: There is diffuse decreased bone mineralization. Severe left femoroacetabular osteoarthritis including superior bone-on-bone contact, extensive subchondral sclerosis and cystic change, and large peripheral osteophytes with moderate  to high-grade superior femoral head and acetabular cortical flattening/bone loss/remodeling. Mild right femoroacetabular joint space narrowing and peripheral osteophytosis. The bilateral sacroiliac and pubic symphysis joint spaces are maintained. No definite acute fracture is seen. No dislocation. Moderate to severe left L4-5 disc space narrowing and peripheral degenerative osteophytosis. IMPRESSION: Severe left femoroacetabular osteoarthritis. No definite acute fracture. Electronically Signed   By: Yvonne Kendall M.D.   On: 01/30/2022 14:06   US  Abdomen Limited RUQ (LIVER/GB)  Result Date: 01/31/2022 CLINICAL DATA:  Cholelithiasis. EXAM: ULTRASOUND ABDOMEN LIMITED RIGHT UPPER QUADRANT COMPARISON:  CT scan of the abdomen and pelvis January 30, 2022 FINDINGS: Gallbladder: Cholelithiasis. Mild wall thickening measuring 3.5 mm. Mild pericholecystic fluid. No Murphy's sign. Common bile duct: Diameter: 5.4 mm Liver: No focal lesion identified. Within normal limits in parenchymal echogenicity. Portal vein is patent on color Doppler imaging with normal direction of blood flow towards the liver. Other: None. IMPRESSION: 1. Cholelithiasis, mild pericholecystic fluid, and mild gallbladder wall thickening. No Murphy's sign. If the clinical picture remains ambiguous, a HIDA scan could further evaluate. 2. The common bile duct and liver are unremarkable. Electronically Signed   By: Dorise Bullion III M.D.   On: 01/31/2022 11:43

## 2022-02-01 NOTE — Progress Notes (Addendum)
ANTICOAGULATION CONSULT NOTE  Pharmacy Consult for Heparin Indication: pulmonary embolus  Allergies  Allergen Reactions   Penicillins Anaphylaxis    Patient Measurements: Height: '5\' 5"'$  (165.1 cm) Weight: 69.5 kg (153 lb 3.5 oz) IBW/kg (Calculated) : 57 Heparin Dosing Weight: 69.5 kg   Vital Signs: Temp: 97.5 F (36.4 C) (06/03 2319) Temp Source: Oral (06/03 2319) BP: 120/86 (06/04 0000) Pulse Rate: 71 (06/04 0600)  Labs: Recent Labs    01/30/22 1211 01/30/22 1404 01/30/22 2315 01/31/22 0030 01/31/22 0630 01/31/22 1025 01/31/22 1755 02/01/22 0248  HGB 7.9*  --  6.8*  --   --  8.1*  --  8.2*  HCT 27.4*  --  23.9*  --   --  25.8*  --  26.2*  PLT 578*  --  447*  --   --  452*  --  471*  LABPROT 16.1*  --   --   --   --   --   --   --   INR 1.3*  --   --   --   --   --   --   --   HEPARINUNFRC  --   --   --   --   --  0.56 0.22* 0.32  CREATININE 0.95  --  0.66  --  0.58  --   --  0.66  CKTOTAL 1,651*  --   --   --  691*  --   --   --   TROPONINIHS 99* 473* 802* 762*  --   --   --   --      Estimated Creatinine Clearance: 68.6 mL/min (by C-G formula based on SCr of 0.66 mg/dL).   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   Heparin level of 0.32 is therapeutic on heparin 1100 units/hr. Level drawn appropriately. Hgb 8.2. Plts elevated. No bleeding noted per RN.   Goal of Therapy:  Heparin level 0.3-0.7 units/ml Monitor platelets by anticoagulation protocol: Yes   Plan:  Continue heparin 1100 units/hr  Check 6 hr confirmatory heparin level Monitor heparin level, CBC and s/s of bleeding   Cristela Felt, PharmD, BCPS Clinical Pharmacist 02/01/2022 7:22 AM  Addendum: Confirmatory heparin level of 0.39 continues to be therapeutic on heparin 1100 units/hr. Level drawn  appropriately.  Plan: Continue heparin 1100 units/hr  Next heparin level tomorrow AM since therapeutic x2  Cristela Felt, PharmD, BCPS Clinical Pharmacist 02/01/2022 9:56 AM

## 2022-02-01 NOTE — Evaluation (Signed)
Physical Therapy Evaluation Patient Details Name: Jennifer Pacheco MRN: [id number] DOB: 08/22/1956 Today's Date: 02/01/2022  History of Present Illness  Patient is a 66 year old female presented to [facility] ED on 6/2 with AMS found down at home. Admitted and found to be hypoxic and hypotensive, CT chest showing submassive PE, CT abd and UA concerning for UTI w/ possible pyelonephritis. 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 PHMx: HTN, DMT2  Clinical Impression  Pt admitted with above diagnosis. Pt received in bed, poor historian with inability to give PLOF or any reliable history. Pt states that she has a boyfriend that she lives with but later states that she lives alone. Pt needed cues to orient to time and had very poor attention making her unsafe with all mobility. Needed verbal and tactile cues for problem solving. Needed min A to come to EOB and stand pivot to chair with +2 for safety. Recommending SNF at this time.  Pt currently with functional limitations due to the deficits listed below (see PT Problem List). Pt will benefit from skilled PT to increase their independence and safety with mobility to allow discharge to the venue listed below.          Recommendations for follow up therapy are one component of a multi-disciplinary discharge planning process, led by the attending physician.  Recommendations may be updated based on patient status, additional functional criteria and insurance authorization.  Follow Up Recommendations Skilled nursing-short term rehab (<3 hours/day)    Assistance Recommended at Discharge Frequent or constant Supervision/Assistance  Patient can return home with the following  A little help with walking and/or transfers;A little help with bathing/dressing/bathroom;Assistance with cooking/housework;Direct  supervision/assist for medications management;Direct supervision/assist for financial management;Assist for transportation;Help with stairs or ramp for entrance    Equipment Recommendations Other (comment) (TBD)  Recommendations for Other Services       Functional Status Assessment Patient has had a recent decline in their functional status and demonstrates the ability to make significant improvements in function in a reasonable and predictable amount of time.     Precautions / Restrictions Precautions Precautions: Fall Restrictions Weight Bearing Restrictions: No      Mobility  Bed Mobility Overal bed mobility: Needs Assistance Bed Mobility: Supine to Sit     Supine to sit: Min assist     General bed mobility comments: pt able to come to long sitting from [facility] elevated but could not problem solve to scoot to EOB, min A to move to R and come to sitting with feet on floor.    Transfers Overall transfer level: Needs assistance Equipment used: 2 person hand held assist Transfers: Sit to/from Stand, Bed to chair/wheelchair/BSC Sit to Stand: Min assist, +2 physical assistance, +2 safety/equipment   Step pivot transfers: Min assist, +2 physical assistance, +2 safety/equipment       General transfer comment: pt continued to have difficulty sequencing mvmts to stand, min A +2 more for safety than power up. Pt able to step feet to chair with UE support    Ambulation/Gait               General Gait Details: next session  Stairs            Wheelchair Mobility    Modified Rankin (Stroke Patients Only)       Balance Overall balance assessment: Needs  assistance, History of Falls Sitting-balance support: Feet supported, No upper extremity supported Sitting balance-Leahy Scale: Fair     Standing balance support: Bilateral upper extremity supported, During functional activity Standing balance-Leahy Scale: Poor Standing balance comment: not attending to safety  or environment, needs UE support for safety                             Pertinent Vitals/Pain Pain Assessment Pain Assessment: Faces Faces Pain Scale: Hurts a little bit Pain Location: L foot Pain Descriptors / Indicators: Sore Pain Intervention(s): Monitored during session    Home Living Family/patient expects to be discharged to:: Unsure                   Additional Comments: pt unable to give any reliable info so unsure of family situation/ help/ PLOF    Prior Function Prior Level of Function : Patient poor historian/Family not available                     Hand Dominance        Extremity/Trunk Assessment   Upper Extremity Assessment Upper Extremity Assessment: Defer to OT evaluation    Lower Extremity Assessment Lower Extremity Assessment: LLE deficits/detail;Difficult to assess due to impaired cognition LLE Deficits / Details: noted swelling L foot, pt states she broke it 6 mos ago. She reports pain when asked about the swelling but stood on it without difficulty. Strength at least 3+/5 BLE's    Cervical / Trunk Assessment Cervical / Trunk Assessment: Normal  Communication   Communication: HOH  Cognition Arousal/Alertness: Awake/alert Behavior During Therapy: Impulsive Overall Cognitive Status: No family/caregiver present to determine baseline cognitive functioning Area of Impairment: Orientation, Attention, Memory, Following commands, Safety/judgement, Awareness, Problem solving                 Orientation Level: Disoriented to, Time, Situation Current Attention Level: Focused Memory: Decreased short-term memory, Decreased recall of precautions Following Commands: Follows one step commands inconsistently Safety/Judgement: Decreased awareness of safety, Decreased awareness of deficits Awareness: Intellectual Problem Solving: Difficulty sequencing, Requires tactile cues, Requires verbal cues General Comments: pt very HOH and  with very limited attention, needed short, direct instructions. Could not provide reliable history and spoke about recent events that included such as coming in with bed bugs and injuring her foot 6 months ago as if they all just happened. She states that she has a boyfriend, no one present on eval. Needed cues to recall current month and year.        General Comments General comments (skin integrity, edema, etc.): VSS on RA throughout. Pt reports she came in with bed bugs related to her boyfriend taking up the carpet, states that she had a bed bug in her eye. Pt with cough that she reports is recent.    Exercises     Assessment/Plan    PT Assessment Patient needs continued PT services  PT Problem List Decreased activity tolerance;Decreased balance;Decreased mobility;Decreased coordination;Decreased cognition;Decreased safety awareness;Decreased knowledge of precautions       PT Treatment Interventions DME instruction;Gait training;Stair training;Functional mobility training;Therapeutic activities;Therapeutic exercise;Balance training;Neuromuscular re-education;Cognitive remediation;Patient/family education    PT Goals (Current goals can be found in the Care Plan section)  Acute Rehab PT Goals Patient Stated Goal: eat breakfast PT Goal Formulation: Patient unable to participate in goal setting Time For Goal Achievement: 02/15/22 Potential to Achieve Goals: Good    Frequency Min 3X/week  Co-evaluation PT/OT/SLP Co-Evaluation/Treatment: Yes Reason for Co-Treatment: Necessary to address cognition/behavior during functional activity PT goals addressed during session: Mobility/safety with mobility;Balance         AM-PAC PT "6 Clicks" Mobility  Outcome Measure Help needed turning from your back to your side while in a flat bed without using bedrails?: None Help needed moving from lying on your back to sitting on the side of a flat bed without using bedrails?: A Little Help  needed moving to and from a bed to a chair (including a wheelchair)?: A Little Help needed standing up from a chair using your arms (e.g., wheelchair or bedside chair)?: Total Help needed to walk in hospital room?: Total Help needed climbing 3-5 steps with a railing? : Total 6 Click Score: 13    End of Session   Activity Tolerance: Patient tolerated treatment well Patient left: in chair;with chair alarm set;with call bell/phone within reach (posey belt chair alarm) Nurse Communication: Mobility status PT Visit Diagnosis: Unsteadiness on feet (R26.81);Difficulty in walking, not elsewhere classified (R26.2)    Time: 2376-2831 PT Time Calculation (min) (ACUTE ONLY): 31 min   Charges:   PT Evaluation $PT Eval Moderate Complexity: [place]  Pager [phone] Office [place] 02/01/2022, 9:19 AM

## 2022-02-01 NOTE — Consult Note (Signed)
Referring Provider:  Lala Lund, MD ([facility])         Primary Care Physician:  [name], NP Primary Gastroenterologist: [facility] - Milas Hock, MD            Reason for Consultation:  abnl im imaging of the esophagus, possible esophageal mass                 ASSESSMENT /  PLAN   66 year old female with medical history as below with recent submassive PE on anticoagulation, brain mass concerning for malignancy with mass effect, history of GERD with dysphagia symptom, CT scan suggesting esophagitis without being able to exclude distal esophageal mass lesion.  GERD/abnormal CT esophagus --she does have reported history of heartburn, dysphagia.  CT does show thickening in the distal esophagus which could be related to reflux esophagitis but we cannot exclude mass lesion without direct visualization.  Upper endoscopy is recommended but timing to be determined based on ongoing clinical progress in the setting of submassive pulmonary embolism and brain tumor with shift.  Not planning immediate EGD but will consider as she gets closer to discharge and we have confirmed cardiopulmonary and neurologic stability.  There are plans to transfer from ICU to floor bed today. --Pantoprazole 40 mg PO twice daily AC --Eventual upper endoscopy; timing to be determined  2.  Brain tumor --neurology following with plan to repeat MRI as last exam had motion limitation  3.  Submassive pulmonary embolism --on anticoagulation.  Echocardiogram reassuring though not a high quality study.    HPI:     Jennifer Pacheco is a 66 y.o. female with a past medical history including but not limited to GERD, advanced colonic adenoma, colonic diverticulosis, hypertension, diabetes who was transferred from [facility] emergency department where she presented after being found down, hypoxic and hypotensive.  It was discovered that she had a submassive PE with right heart strain, possible  pyelonephritis, and a right brain mass with mass effect and 1.1 cm of leftward shift.  We are consulted for abnormal CT chest showing esophagitis and raising the question of an esophageal mass.  Patient is awake and admits to having some confusion.  She is able to answer my questions directly.  She states that she does have a history of heartburn and this has been ongoing today.  She typically takes Tums for this.  She reports for several months she feels like something is stuck near her sternal notch.  She admits to solid food dysphagia at home and intermittent odynophagia.  Denies nausea and vomiting at home.  Does recall previous colonoscopy x3.  She cannot recall who performed her colonoscopies.  She does recall having a polyp history.  She does not report any recent blood in stool or melena.  She denies a family history of GI tract malignancy.  She states that she lives alone with her cat.  She also states that her home is currently flea and bedbug infested.  She is not married but states that she has a boyfriend who helps her with some of her medical care.  Pertinent GI tract imaging as below: [place]: Cholelithiasis with mild pericholecystic fluid and gallbladder wall thickening.  No Murphy sign.  By ultrasound, bile duct and liver are unremarkable. CT chest abdomen pelvis with contrast: Periportal edema, no suspicious hepatic lesions, no biliary ductal dilation, cholelithiasis, normal pancreas.  No ascites.  Patulous esophagus thickened distally up to 1 cm focally at GE junction.  Past Medical History:  Diagnosis Date   Hard of hearing   Advanced colonic adenoma with high-grade dysplasia  Colonic diverticulosis Hypertension Diabetes, type II GERD  Past Surgical History:  Procedure Laterality Date   BREAST BIOPSY Left 2020   beg   Colonoscopy report obtained from care everywhere Date of procedure 01/06/2017: Indication: Surveillance of adenomatous colon polyps on last colonoscopy > 5  years ago; history of high risk adenoma with polyp greater than 1 cm with high-grade dysplasia Procedure: Complete exam with good prep Findings: 1.  Perianal skin tag 2.  Mild left-sided diverticulosis 3.  The entire examined colon is otherwise normal. 4.  Mild internal hemorrhoids. Recommendations: 1.  Repeat colonoscopy 5 years for surveillance given history of prior high risk adenoma  Prior to Admission medications   Medication Sig Start Date End Date Taking? Authorizing Provider  lisinopril (ZESTRIL) 20 MG tablet Take 20 mg by mouth daily. 04/15/20  Yes [provider]  meloxicam (MOBIC) 15 MG tablet Take 15 mg by mouth daily as needed for pain.   Yes [provider]  cyclobenzaprine (FLEXERIL) 10 MG tablet Take by mouth. Patient not taking: Reported on 01/30/2022    [provider]  metFORMIN (GLUCOPHAGE) 500 MG tablet Take by mouth. Patient not taking: Reported on 01/30/2022    [provider]  Omega-3 1000 MG CAPS Take by mouth. Patient not taking: Reported on 01/30/2022    [provider]    Current Facility-Administered Medications  Medication Dose Route Frequency Provider Last Rate Last Admin   0.9 %  sodium chloride infusion  250 mL Intravenous Continuous Sherwood Gambler, MD 10 mL/hr at 01/30/22 2141 Rate Change at 01/30/22 2141   acetaminophen (TYLENOL) tablet 650 mg  650 mg Oral Q4H PRN Mick Sell, PA-C   650 mg at 02/01/22 1109   calcium carbonate (TUMS - dosed in mg elemental calcium) chewable tablet 200 mg of elemental calcium  1 tablet Oral TID PRN Margaretha Seeds, MD   200 mg of elemental calcium at 02/01/22 1108   Chlorhexidine Gluconate Cloth 2 % PADS 6 each  6 each Topical Q0600 Juanito Doom, MD   6 each at 01/31/22 2129   dexamethasone (DECADRON) injection 10 mg  10 mg Intravenous Q6H Sherwood Gambler, MD   10 mg at 02/01/22 0549   docusate sodium (COLACE) capsule 100 mg  100 mg Oral BID PRN Mick Sell, PA-C        heparin ADULT infusion 100 units/mL (25000 units/276m)  1,100 Units/hr Intravenous Continuous BEinar Grad RPH 11 mL/hr at 02/01/22 1100 1,100 Units/hr at 02/01/22 1100   insulin aspart (novoLOG) injection 0-9 Units  0-9 Units Subcutaneous Q4H PMick Sell PA-C   1 Units at 02/01/22 02376  levofloxacin (LEVAQUIN) tablet 500 mg  500 mg Oral Daily SThurnell Lose MD       MEDLINE mouth rinse  15 mL Mouth Rinse BID MSimonne MaffucciB, MD   15 mL at 01/30/22 2139   ondansetron (ZOFRAN) injection 4 mg  4 mg Intravenous Q6H PRN EMargaretha Seeds MD       pantoprazole (PROTONIX) EC tablet 40 mg  40 mg Oral Daily PMick Sell PA-C   40 mg at 02/01/22 1028   polyethylene glycol (MIRALAX / GLYCOLAX) packet 17 g  17 g Oral Daily PRN PMick Sell PA-C        Allergies as of 01/30/2022 - Review Complete 01/30/2022  Allergen  Reaction Noted   Penicillins Anaphylaxis 06/19/2020    Family History  Problem Relation Age of Onset   Breast cancer Maternal Aunt     Social History   Tobacco Use   Smoking status: Never   Smokeless tobacco: Never  Substance Use Topics   Alcohol use: Not Currently   Drug use: Yes    Types: Marijuana    Review of Systems: All systems reviewed and negative except where noted in HPI.  Physical Exam: Vital signs in last 24 hours: Temp:  [97.5 F (36.4 C)-98.1 F (36.7 C)] 97.6 F (36.4 C) (06/04 1117) Pulse Rate:  [61-91] 70 (06/04 1100) Resp:  [16-27] 22 (06/04 1100) BP: (96-151)/(67-122) 146/90 (06/04 1100) SpO2:  [87 %-100 %] 97 % (06/04 1100) Last BM Date : 01/30/22 General:   Awake, alert, NAD Psych:  Pleasant, cooperative.  Eyes:  Pupils equal, sclera clear, no icterus.    Lungs:  Clear throughout to auscultation.   No wheezes, crackles, or rhonchi.  Heart:  Regular rate and rhythm; no murmurs, no lower extremity edema Abdomen:  Soft, non-distended, nontender, BS active, no palp mass   Rectal:  Deferred  Msk:  Symmetrical without gross  deformities. . Skin:  Intact without significant lesions or rashes.   Intake/Output from previous day: 06/03 0701 - 06/04 0700 In: 2140.8 [P.O.:240; I.V.:1476.6; IV Piggyback:424.2] Out: 1101 [Urine:1101] Intake/Output this shift: Total I/O In: 165.5 [I.V.:104.9; IV Piggyback:60.6] Out: -   Lab Results: Recent Labs    01/30/22 2315 01/31/22 1025 02/01/22 0248  WBC 15.7* 13.5* 16.6*  HGB 6.8* 8.1* 8.2*  HCT 23.9* 25.8* 26.2*  PLT 447* 452* 471*   BMET Recent Labs    01/30/22 2315 01/31/22 0630 02/01/22 0248  NA 135 133* 133*  K 3.7 4.3 3.7  CL 105 105 106  CO2 19* 20* 21*  GLUCOSE 137* 135* 180*  BUN 26* 23 23  CREATININE 0.66 0.58 0.66  CALCIUM 8.3* 8.3* 8.5*   LFT Recent Labs    01/30/22 2315  PROT 6.3*  ALBUMIN 2.8*  AST 59*  ALT 37  ALKPHOS 52  BILITOT 0.5   PT/INR Recent Labs    01/30/22 1211  LABPROT 16.1*  INR 1.3*   Hepatitis Panel No results for input(s): HEPBSAG, HCVAB, HEPAIGM, HEPBIGM in the last 72 hours.   .    Latest Ref Rng & Units 02/01/2022    2:48 AM 01/31/2022   10:25 AM 01/30/2022   11:15 PM  CBC  WBC 4.0 - 10.5 K/uL 16.6   13.5   15.7    Hemoglobin 12.0 - 15.0 g/dL 8.2   8.1   6.8    Hematocrit 36.0 - 46.0 % 26.2   25.8   23.9    Platelets 150 - 400 K/uL 471   452   447      .    Latest Ref Rng & Units 02/01/2022    2:48 AM 01/31/2022    6:30 AM 01/30/2022   11:15 PM  CMP  Glucose 70 - 99 mg/dL 180   135   137    BUN 8 - 23 mg/dL '23   23   26    '$ Creatinine 0.44 - 1.00 mg/dL 0.66   0.58   0.66    Sodium 135 - 145 mmol/L 133   133   135    Potassium 3.5 - 5.1 mmol/L 3.7   4.3   3.7    Chloride 98 - 111 mmol/L 106  105   105    CO2 22 - 32 mmol/L '21   20   19    '$ Calcium 8.9 - 10.3 mg/dL 8.5   8.3   8.3    Total Protein 6.5 - 8.1 g/dL   6.3    Total Bilirubin 0.3 - 1.2 mg/dL   0.5    Alkaline Phos 38 - 126 U/L   52    AST 15 - 41 U/L   59    ALT 0 - 44 U/L   37     Studies/Results: DG Chest 1 View  Result Date:  01/30/2022 CLINICAL DATA:  Altered mental status.  Fall. EXAM: CHEST  1 VIEW COMPARISON:  None available FINDINGS: Cardiac silhouette and mediastinal contours within normal limits. The lungs are clear. No pleural effusion or pneumothorax. Mild dextrocurvature of the midthoracic spine with moderate multilevel degenerative disc changes. IMPRESSION: No active disease. Electronically Signed   By: Yvonne Kendall M.D.   On: 01/30/2022 14:08   DG Lumbar Spine Complete  Result Date: 01/30/2022 CLINICAL DATA:  Fall.  Pain. EXAM: LUMBAR SPINE - COMPLETE 4+ VIEW COMPARISON:  None available FINDINGS: There are 5 non-rib-bearing lumbar-type vertebral bodies. Minimal levocurvature centered at L2-3 with moderate right L2-3 disc space narrowing, endplate sclerosis, vacuum phenomenon, and peripheral osteophytosis. Mild right L3-4 and severe left L4-5 disc space narrowing. Large left lateral L4-5 endplate osteophytes. No sagittal spondylolisthesis. Vertebral body heights are maintained. Severe left femoroacetabular osteoarthritis with moderate to high-grade superior femoral head bone loss. IMPRESSION: Moderate multilevel degenerative disc and endplate changes greatest at the left L4-5 diffuse L5-S1 and right L2-3 levels. Electronically Signed   By: Yvonne Kendall M.D.   On: 01/30/2022 14:11   DG Knee 1-2 Views Left  Result Date: 02/01/2022 CLINICAL DATA:  Acute left knee pain.  No known injury. EXAM: LEFT KNEE - 2 VIEW COMPARISON:  None Available. FINDINGS: No evidence of fracture, dislocation, or joint effusion. No evidence of arthropathy or other focal bone abnormality. Soft tissues are unremarkable. IMPRESSION: Negative. Electronically Signed   By: Marlaine Hind M.D.   On: 02/01/2022 09:22   CT Head Wo Contrast  Result Date: 01/30/2022 CLINICAL DATA:  Provided history: Mental status change, unknown cause. Additional history provided: Mental status change, sepsis EXAM: CT HEAD WITHOUT CONTRAST TECHNIQUE: Contiguous axial  images were obtained from the base of the skull through the vertex without intravenous contrast. RADIATION DOSE REDUCTION: This exam was performed according to the departmental dose-optimization program which includes automated exposure control, adjustment of the mA and/or kV according to patient size and/or use of iterative reconstruction technique. COMPARISON:  None. FINDINGS: Brain: Mild generalized cerebral atrophy. Large subtly hyperdense irregular mass centered in the right basal ganglia, right insula and adjacent right frontotemporal lobes. Measurements are difficult to provide due to the irregularity and ill-defined margins of the mass. Severe surrounding edema within the right cerebral hemisphere. Mass effect with near complete effacement of the right lateral ventricle and 11 mm leftward midline shift measured at the level of the septum pellucidum. Background patchy and ill-defined hypoattenuation within the cerebral white matter, nonspecific but compatible with chronic small vessel ischemic disease. There is no acute intracranial hemorrhage. No demarcated cortical infarct. No extra-axial fluid collection. Vascular: No hyperdense vessel.  Atherosclerotic calcifications. Skull: No fracture or aggressive osseous lesion. Sinuses/Orbits: No mass or acute finding within the imaged orbits. Frothy secretions and small fluid level within the right sphenoid sinus. These results were called by telephone at  the time of interpretation on 01/30/2022 at 2:30 pm to provider Sherwood Gambler , who verbally acknowledged these results. IMPRESSION: Large subtly hyperdense and irregular mass centered in the right basal ganglia, right insula and adjacent right frontotemporal lobes. Severe surrounding edema within the right cerebral hemisphere. Resultant mass effect with near complete effacement of the right lateral ventricle and 11 mm leftward midline shift. This is favored to reflect a high-grade CNS neoplasm (such as glioblastoma  multiforme). A brain MRI without and with contrast is recommended. Neurosurgical consultation is also recommended. Background chronic small ischemic changes within the cerebral white matter. Mild generalized cerebral atrophy. Right sphenoid sinusitis. Electronically Signed   By: Kellie Simmering D.O.   On: 01/30/2022 14:36   CT Angio Chest PE W and/or Wo Contrast  Result Date: 01/30/2022 CLINICAL DATA:  Altered mental status and sepsis. * Tracking Code: BO * EXAM: CT ANGIOGRAPHY CHEST CT ABDOMEN AND PELVIS WITH CONTRAST TECHNIQUE: Multidetector CT imaging of the chest was performed using the standard protocol during bolus administration of intravenous contrast. Multiplanar CT image reconstructions and MIPs were obtained to evaluate the vascular anatomy. Multidetector CT imaging of the abdomen and pelvis was performed using the standard protocol during bolus administration of intravenous contrast. RADIATION DOSE REDUCTION: This exam was performed according to the departmental dose-optimization program which includes automated exposure control, adjustment of the mA and/or kV according to patient size and/or use of iterative reconstruction technique. CONTRAST:  11m OMNIPAQUE IOHEXOL 350 MG/ML SOLN COMPARISON:  Abdomen evaluation of the same date. FINDINGS: CTA CHEST FINDINGS Cardiovascular: There is thrombus in the RIGHT lower lobe artery with extension into RIGHT middle lobe branches, at the segmental level. There is also thrombus in RIGHT lower lobe branches at the central segmental level. Near occlusive thrombus to posterior RIGHT lower lobe branches. Central segmental involvement of all upper lobe branches on the RIGHT as well. Similar pattern of thrombosis in the LEFT chest also with lower lobe level, lobar level embolus perhaps bridging into upper lobe branches and filling lower lobe branches as well, lobar and segmental level emboli throughout the LEFT chest. There are multiple segmental level thrombi in the  LEFT lower lobe, lingular branch and upper lobe branches. RV to LV ratio is increased to between 1.24 and 1.54 depending on level of measurement. Marked straightening of the interventricular septum. No pericardial effusion or signs of pericardial thickening. Aorta with mild dilation but without aneurysmal caliber. No acute aortic process with classic three-vessel branching pattern. Mediastinum/Nodes: Esophagus is patulous and thickened distally up to nearly a cm greatest thickness above the GE junction which is short segment and or focal. No thoracic inlet adenopathy. No axillary lymphadenopathy. No hilar or mediastinal lymphadenopathy. Lungs/Pleura: There is no pneumothorax. No pleural effusion. No lobar consolidation. Airways are patent. Musculoskeletal: No acute bone finding. No destructive bone process. Spinal degenerative changes. Review of the MIP images confirms the above findings. CT ABDOMEN and PELVIS FINDINGS Hepatobiliary: Signs of periportal edema. No focal, suspicious hepatic lesion. Motion limited assessment of structures in the upper abdomen. No signs of biliary duct dilation. Pericholecystic stranding without substantial gallbladder distension and with cholelithiasis. Pancreas: Normal, without mass, inflammation or ductal dilatation. Spleen: Spleen is normal in size and contour. Adrenals/Urinary Tract: Adrenal glands are normal. Striated nephrogram bilaterally. No focal, suspicious renal lesion. Urinary bladder under distended otherwise unremarkable. Stomach/Bowel: Distal esophageal thickening. No acute gastric or small bowel process. Normal appendix. Colon is largely decompressed with signs of diverticular changes of the sigmoid. Vascular/Lymphatic:  Aortic atherosclerosis. No sign of aneurysm. Smooth contour of the IVC. There is no gastrohepatic or hepatoduodenal ligament lymphadenopathy. No retroperitoneal or mesenteric lymphadenopathy. No pelvic sidewall lymphadenopathy. Reproductive: Unremarkable  by CT. Other: No ascites. Generalized mild stranding about the upper abdomen may be related to urinary tract infection. No pneumoperitoneum. Musculoskeletal: Severe LEFT hip degenerative changes. Degenerative changes of the spine. No acute or destructive bone process. Review of the MIP images confirms the above findings. IMPRESSION: 1. Positive for acute PE with CT evidence of right heart strain (RV/LV Ratio = between 1.28 and 1.54) consistent with at least submassive (intermediate risk) PE. The presence of right heart strain has been associated with an increased risk of morbidity and mortality. Please refer to the "Code PE Focused" order set in EPIC. 2. Cholelithiasis, pericholecystic stranding favored to be related to cardiogenic factors and or volume resuscitation. Could consider ultrasound as warranted for further assessment. No wall thickening or substantial distension. 3. CT findings of pyelonephritis most pronounced in the LEFT upper pole. 4. Esophagus is patulous and thickened distally up to nearly a cm greatest thickness above the GE junction which is short segment and or focal. Findings are suspicious for neoplasm, potentially related to severe esophagitis. 5. Severe LEFT hip degenerative changes. 6. Aortic atherosclerosis. Findings of pulmonary emboli and esophageal thickening were discussed with the provider caring for the patient as outlined below. Critical Value/emergent results were called by telephone at the time of interpretation on 01/30/2022 at 2:28 pm to provider Sherwood Gambler , who verbally acknowledged these results. Electronically Signed   By: Zetta Bills M.D.   On: 01/30/2022 14:29   MR BRAIN WO CONTRAST  Result Date: 01/31/2022 CLINICAL DATA:  Mental status change.  Follow-up brain tumor. EXAM: MRI HEAD WITHOUT CONTRAST TECHNIQUE: Multiplanar, multiecho pulse sequences of the brain and surrounding structures were obtained without intravenous contrast. COMPARISON:  Head CT yesterday.  FINDINGS: Brain: The patient was disoriented and would not hold still for scanning. Contrast enhanced imaging was not achieved. The only coronal imaging achieve was the diffusion study. Medication or anesthesia may be necessary. The brainstem and cerebellum are normal. Both cerebral hemispheres show chronic appearing small vessel ischemic changes of the white matter. There is an approximately 5.2 x 5.3 cm mass lesion in the Peri insular brain on the right, exact site of origin not determined, but temporal lobe is favored. I would say there is some possibility that this could even be an extra-axial mass lesion. The surrounding brain shows pronounced vasogenic edema. There is mass effect with right-to-left shift of 11 mm. No hydrocephalus/ventricular trapping. Vascular: Major vessels at the base of the brain show flow. Skull and upper cervical spine: Negative Sinuses/Orbits: Clear/normal Other: None IMPRESSION: Limited and abbreviated study because of the patient's condition and patient motion. The study confirms the presence of an approximately 5.2 x 5.3 cm mass on the right. I think this could be intra-axial, probably arising from the temporal insula, but I think there is some potential that it could be an extra-axial mass. No evidence of necrosis or pronounced heterogeneity. Mass effect with right-to-left shift 11 mm. Vasogenic edema within the right hemisphere. Chronic small-vessel ischemic changes of the cerebral hemispheric white matter. Electronically Signed   By: Nelson Chimes M.D.   On: 01/31/2022 15:51   CT ABDOMEN PELVIS W CONTRAST  Result Date: 01/30/2022 CLINICAL DATA:  Altered mental status and sepsis. * Tracking Code: BO * EXAM: CT ANGIOGRAPHY CHEST CT ABDOMEN AND PELVIS WITH CONTRAST TECHNIQUE:  Multidetector CT imaging of the chest was performed using the standard protocol during bolus administration of intravenous contrast. Multiplanar CT image reconstructions and MIPs were obtained to evaluate the  vascular anatomy. Multidetector CT imaging of the abdomen and pelvis was performed using the standard protocol during bolus administration of intravenous contrast. RADIATION DOSE REDUCTION: This exam was performed according to the departmental dose-optimization program which includes automated exposure control, adjustment of the mA and/or kV according to patient size and/or use of iterative reconstruction technique. CONTRAST:  111m OMNIPAQUE IOHEXOL 350 MG/ML SOLN COMPARISON:  Abdomen evaluation of the same date. FINDINGS: CTA CHEST FINDINGS Cardiovascular: There is thrombus in the RIGHT lower lobe artery with extension into RIGHT middle lobe branches, at the segmental level. There is also thrombus in RIGHT lower lobe branches at the central segmental level. Near occlusive thrombus to posterior RIGHT lower lobe branches. Central segmental involvement of all upper lobe branches on the RIGHT as well. Similar pattern of thrombosis in the LEFT chest also with lower lobe level, lobar level embolus perhaps bridging into upper lobe branches and filling lower lobe branches as well, lobar and segmental level emboli throughout the LEFT chest. There are multiple segmental level thrombi in the LEFT lower lobe, lingular branch and upper lobe branches. RV to LV ratio is increased to between 1.24 and 1.54 depending on level of measurement. Marked straightening of the interventricular septum. No pericardial effusion or signs of pericardial thickening. Aorta with mild dilation but without aneurysmal caliber. No acute aortic process with classic three-vessel branching pattern. Mediastinum/Nodes: Esophagus is patulous and thickened distally up to nearly a cm greatest thickness above the GE junction which is short segment and or focal. No thoracic inlet adenopathy. No axillary lymphadenopathy. No hilar or mediastinal lymphadenopathy. Lungs/Pleura: There is no pneumothorax. No pleural effusion. No lobar consolidation. Airways are  patent. Musculoskeletal: No acute bone finding. No destructive bone process. Spinal degenerative changes. Review of the MIP images confirms the above findings. CT ABDOMEN and PELVIS FINDINGS Hepatobiliary: Signs of periportal edema. No focal, suspicious hepatic lesion. Motion limited assessment of structures in the upper abdomen. No signs of biliary duct dilation. Pericholecystic stranding without substantial gallbladder distension and with cholelithiasis. Pancreas: Normal, without mass, inflammation or ductal dilatation. Spleen: Spleen is normal in size and contour. Adrenals/Urinary Tract: Adrenal glands are normal. Striated nephrogram bilaterally. No focal, suspicious renal lesion. Urinary bladder under distended otherwise unremarkable. Stomach/Bowel: Distal esophageal thickening. No acute gastric or small bowel process. Normal appendix. Colon is largely decompressed with signs of diverticular changes of the sigmoid. Vascular/Lymphatic: Aortic atherosclerosis. No sign of aneurysm. Smooth contour of the IVC. There is no gastrohepatic or hepatoduodenal ligament lymphadenopathy. No retroperitoneal or mesenteric lymphadenopathy. No pelvic sidewall lymphadenopathy. Reproductive: Unremarkable by CT. Other: No ascites. Generalized mild stranding about the upper abdomen may be related to urinary tract infection. No pneumoperitoneum. Musculoskeletal: Severe LEFT hip degenerative changes. Degenerative changes of the spine. No acute or destructive bone process. Review of the MIP images confirms the above findings. IMPRESSION: 1. Positive for acute PE with CT evidence of right heart strain (RV/LV Ratio = between 1.28 and 1.54) consistent with at least submassive (intermediate risk) PE. The presence of right heart strain has been associated with an increased risk of morbidity and mortality. Please refer to the "Code PE Focused" order set in EPIC. 2. Cholelithiasis, pericholecystic stranding favored to be related to cardiogenic  factors and or volume resuscitation. Could consider ultrasound as warranted for further assessment. No wall thickening or  substantial distension. 3. CT findings of pyelonephritis most pronounced in the LEFT upper pole. 4. Esophagus is patulous and thickened distally up to nearly a cm greatest thickness above the GE junction which is short segment and or focal. Findings are suspicious for neoplasm, potentially related to severe esophagitis. 5. Severe LEFT hip degenerative changes. 6. Aortic atherosclerosis. Findings of pulmonary emboli and esophageal thickening were discussed with the provider caring for the patient as outlined below. Critical Value/emergent results were called by telephone at the time of interpretation on 01/30/2022 at 2:28 pm to provider Sherwood Gambler , who verbally acknowledged these results. Electronically Signed   By: Zetta Bills M.D.   On: 01/30/2022 14:29   ECHOCARDIOGRAM COMPLETE  Result Date: 01/31/2022    ECHOCARDIOGRAM REPORT   Patient Name:   Jennifer Pacheco [facility] Date of Exam: 01/31/2022 Medical Rec #:  [id number]              Height:       65.0 in Accession #:    [id number]             Weight:       153.2 lb Date of Birth:  1955/12/05               BSA:          1.766 m Patient Age:    37 years               BP:           123/82 mmHg Patient Gender: F                      HR:           77 bpm. Exam Location:  Inpatient Procedure: 2D Echo and Color Doppler Indications:    I26.02 Pulmonary embolus  History:        Patient has no prior history of Echocardiogram examinations.                 Signs/Symptoms:Altered Mental Status. Brain mass. Shock.  Sonographer:    Roseanna Rainbow RDCS Referring Phys: [id number] Mick Sell  Sonographer Comments: Image acquisition challenging due to patient behavioral factors. and Image acquisition challenging due to uncooperative patient. Exam ended because patient grabbed me twice and could not stay in position. Patient pushed probe away and rolled into right  decubitus position despite my direction. IMPRESSIONS  1. Left ventricular ejection fraction, by estimation, is 60 to 65%. The left ventricle has normal function. Left ventricular endocardial border not optimally defined to evaluate regional wall motion. Left ventricular diastolic function could not be evaluated.  2. Right ventricular systolic function is normal. The right ventricular size is not well visualized.  3. The mitral valve is normal in structure. Mild mitral valve regurgitation. No evidence of mitral stenosis.  4. The aortic valve is tricuspid. Aortic valve regurgitation is not visualized. No aortic stenosis is present.  5. There is mild dilatation of the ascending aorta, measuring 39 mm. Conclusion(s)/Recommendation(s): Very limited study. Only parasternal long axis images obtained. Unable to reliably assess LV wall motion and confidently estimate RV function, but based on the data available, both ventricles appear to have normal contractility. Repeat study when patient is cooperative. FINDINGS  Left Ventricle: Left ventricular ejection fraction, by estimation, is 60 to 65%. The left ventricle has normal function. Left ventricular endocardial border not optimally defined to evaluate regional wall motion. The left ventricular internal cavity size was  normal in size. There is no left ventricular hypertrophy. Left ventricular diastolic function could not be evaluated. Right Ventricle: The right ventricular size is not well visualized. Right vetricular wall thickness was not well visualized. Right ventricular systolic function is normal. Left Atrium: Left atrial size was normal in size. Right Atrium: Right atrial size was not well visualized. Pericardium: There is no evidence of pericardial effusion. Mitral Valve: The mitral valve is normal in structure. Mild mitral annular calcification. Mild mitral valve regurgitation. No evidence of mitral valve stenosis. Tricuspid Valve: The tricuspid valve is normal in  structure. Tricuspid valve regurgitation is not demonstrated. Aortic Valve: The aortic valve is tricuspid. Aortic valve regurgitation is not visualized. No aortic stenosis is present. Pulmonic Valve: The pulmonic valve was not well visualized. Aorta: The aortic root is normal in size and structure. There is mild dilatation of the ascending aorta, measuring 39 mm. IAS/Shunts: The interatrial septum was not assessed.  LEFT VENTRICLE PLAX 2D LVIDd:         3.90 cm LVIDs:         2.40 cm LV PW:         0.80 cm LV IVS:        0.70 cm LVOT diam:     2.20 cm LVOT Area:     3.80 cm  LEFT ATRIUM         Index LA diam:    2.70 cm 1.53 cm/m   AORTA Ao Root diam: 3.50 cm Ao Asc diam:  3.90 cm  SHUNTS Systemic Diam: 2.20 cm Dani Gobble Croitoru MD Electronically signed by Sanda Klein MD Signature Date/Time: 01/31/2022/9:41:28 AM    Final    DG HIP UNILAT WITH PELVIS 2-3 VIEWS LEFT  Result Date: 02/01/2022 CLINICAL DATA:  Acute left hip pain. EXAM: DG HIP (WITH OR WITHOUT PELVIS) 2-3V LEFT COMPARISON:  None Available. FINDINGS: No evidence of left hip fracture or dislocation. Severe left hip osteoarthritis is seen, with chronic collapse of the femoral head, geode formation, and chronic bony remodeling. IMPRESSION: No acute findings. End-stage left hip osteoarthritis. Electronically Signed   By: Marlaine Hind M.D.   On: 02/01/2022 09:21   DG Hip Unilat W or Wo Pelvis 2-3 Views Left  Result Date: 01/30/2022 CLINICAL DATA:  Altered mental status.  Fall. EXAM: DG HIP (WITH OR WITHOUT PELVIS) 2-3V LEFT COMPARISON:  None Available. FINDINGS: There is diffuse decreased bone mineralization. Severe left femoroacetabular osteoarthritis including superior bone-on-bone contact, extensive subchondral sclerosis and cystic change, and large peripheral osteophytes with moderate to high-grade superior femoral head and acetabular cortical flattening/bone loss/remodeling. Mild right femoroacetabular joint space narrowing and peripheral  osteophytosis. The bilateral sacroiliac and pubic symphysis joint spaces are maintained. No definite acute fracture is seen. No dislocation. Moderate to severe left L4-5 disc space narrowing and peripheral degenerative osteophytosis. IMPRESSION: Severe left femoroacetabular osteoarthritis. No definite acute fracture. Electronically Signed   By: Yvonne Kendall M.D.   On: 01/30/2022 14:06   US Abdomen Limited RUQ (LIVER/GB)  Result Date: 01/31/2022 CLINICAL DATA:  Cholelithiasis. EXAM: ULTRASOUND ABDOMEN LIMITED RIGHT UPPER QUADRANT COMPARISON:  CT scan of the abdomen and pelvis January 30, 2022 FINDINGS: Gallbladder: Cholelithiasis. Mild wall thickening measuring 3.5 mm. Mild pericholecystic fluid. No Murphy's sign. Common bile duct: Diameter: 5.4 mm Liver: No focal lesion identified. Within normal limits in parenchymal echogenicity. Portal vein is patent on color Doppler imaging with normal direction of blood flow towards the liver. Other: None. IMPRESSION: 1. Cholelithiasis, mild pericholecystic fluid, and mild  gallbladder wall thickening. No Murphy's sign. If the clinical picture remains ambiguous, a HIDA scan could further evaluate. 2. The common bile duct and liver are unremarkable. Electronically Signed   By: Dorise Bullion III M.D.   On: 01/31/2022 11:43    Principal Problem:   Pulmonary embolism ([place]) Active Problems:   Acute respiratory failure with hypoxia (H[facility])   Brain edema ([place])   Fall   Shock ([place])   Hypokalemia   Brain mass   Pressure injury of skin    Erikka Follmer M. Antion Andres, M.D. @  02/01/2022, 11:39 AM

## 2022-02-01 NOTE — Evaluation (Signed)
Occupational Therapy Evaluation Patient Details Name: Jennifer Pacheco MRN: [id number] DOB: Feb 16, 1956 Today's Date: 02/01/2022   History of Present Illness Patient is a 66 year old female presented to [facility] ED on 6/2 with AMS found down at home. Admitted and found to be hypoxic and hypotensive, CT chest showing submassive PE, CT abd and UA concerning for UTI w/ possible pyelonephritis. 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 PHMx: HTN, DMT2   Clinical Impression   This 66 yo female admitted with above presents to acute OT with PLOF truly unknown. Currently she is setup/S-min A for basic ADLs with decreased safety awareness, impulsive and very disjointed conversation. She will continue to benefit from acute OT with follow up at SNF.      Recommendations for follow up therapy are one component of a multi-disciplinary discharge planning process, led by the attending physician.  Recommendations may be updated based on patient status, additional functional criteria and insurance authorization.   Follow Up Recommendations  Skilled nursing-short term rehab (<3 hours/day)    Assistance Recommended at Discharge Frequent or constant Supervision/Assistance  Patient can return home with the following A little help with walking and/or transfers;A little help with bathing/dressing/bathroom;Assistance with cooking/housework;Assistance with feeding;Help with stairs or ramp for entrance;Assist for transportation;Direct supervision/assist for financial management;Direct supervision/assist for medications management    Functional Status Assessment  Patient has had a recent decline in their functional status and demonstrates the ability to make significant improvements in function in a reasonable and predictable amount of time.  Equipment  Recommendations  Other (comment) (TBD next venue)       Precautions / Restrictions Precautions Precautions: Fall Restrictions Weight Bearing Restrictions: No      Mobility Bed Mobility Overal bed mobility: Needs Assistance Bed Mobility: Supine to Sit     Supine to sit: Min assist     General bed mobility comments: pt able to come to long sitting from [facility] elevated but could not problem solve to scoot to EOB, min A to move to R and come to sitting with feet on floor.    Transfers Overall transfer level: Needs assistance Equipment used: 2 person hand held assist Transfers: Sit to/from Stand, Bed to chair/wheelchair/BSC Sit to Stand: Min assist, +2 physical assistance, +2 safety/equipment     Step pivot transfers: Min assist, +2 physical assistance, +2 safety/equipment     General transfer comment: pt continued to have difficulty sequencing mvmts to stand, min A +2 more for safety than power up. Pt able to step feet to chair with UE support      Balance Overall balance assessment: Needs assistance, History of Falls Sitting-balance support: Feet supported, No upper extremity supported Sitting balance-Leahy Scale: Fair     Standing balance support: Bilateral upper extremity supported, During functional activity Standing balance-Leahy Scale: Poor Standing balance comment: not attending to safety or environment, needs UE support for safety                           ADL either performed or assessed with clinical judgement   ADL Overall ADL's : Needs assistance/impaired Eating/Feeding: Set up;Sitting   Grooming: Set up;Supervision/safety;Sitting   Upper Body Bathing: Supervision/ safety;Set up;Sitting   Lower Body Bathing: Minimal assistance;Sit to/from stand   Upper Body Dressing : Set up;Supervision/safety;Sitting   Lower Body Dressing: Minimal  assistance;Sit to/from stand   Toilet Transfer: Minimal assistance;+2 for safety/equipment;Stand-pivot    Toileting- Clothing Manipulation and Hygiene: Minimal assistance;Sit to/from stand               Vision Patient Visual Report: No change from baseline              Pertinent Vitals/Pain Pain Assessment Pain Assessment: Faces Faces Pain Scale: Hurts a little bit Pain Location: L foot Pain Descriptors / Indicators: Sore Pain Intervention(s): Monitored during session     Hand Dominance Right   Extremity/Trunk Assessment Upper Extremity Assessment Upper Extremity Assessment: Overall WFL for tasks assessed   Lower Extremity Assessment Lower Extremity Assessment: LLE deficits/detail;Difficult to assess due to impaired cognition LLE Deficits / Details: noted swelling L foot, pt states she broke it 6 mos ago. She reports pain when asked about the swelling but stood on it without difficulty. Strength at least 3+/5 BLE's   Cervical / Trunk Assessment Cervical / Trunk Assessment: Normal   Communication Communication Communication: HOH   Cognition Arousal/Alertness: Awake/alert Behavior During Therapy: Impulsive Overall Cognitive Status: No family/caregiver present to determine baseline cognitive functioning Area of Impairment: Orientation, Attention, Memory, Following commands, Safety/judgement, Awareness, Problem solving                 Orientation Level: Disoriented to, Time, Situation Current Attention Level: Focused Memory: Decreased short-term memory, Decreased recall of precautions Following Commands: Follows one step commands inconsistently Safety/Judgement: Decreased awareness of safety, Decreased awareness of deficits Awareness: Intellectual Problem Solving: Difficulty sequencing, Requires tactile cues, Requires verbal cues General Comments: pt very HOH and with very limited attention, needed short, direct instructions. Could not provide reliable history and spoke about recent events that included such as coming in with bed bugs and injuring her foot 6 months  ago as if they all just happened. She states that she has a boyfriend, no one present on eval. Needed cues to recall current month and year.     General Comments  VSS on RA throughout. Pt reports she came in with bed bugs related to her boyfriend taking up the carpet, states that she had a bed bug in her eye. Pt with cough that she reports is recent.            Home Living Family/patient expects to be discharged to:: Unsure                                 Additional Comments: pt unable to give any reliable info so unsure of family situation/ help/ PLOF      Prior Functioning/Environment Prior Level of Function : Patient poor historian/Family not available                        OT Problem List: Impaired balance (sitting and/or standing);Decreased cognition;Decreased safety awareness;Pain      OT Treatment/Interventions: Self-care/ADL training;DME and/or AE instruction;Patient/family education;Balance training    OT Goals(Current goals can be found in the care plan section) Acute Rehab OT Goals Patient Stated Goal: to eat breakfast OT Goal Formulation: With patient Time For Goal Achievement: 02/15/22 Potential to Achieve Goals: Good  OT Frequency: Min 2X/week    Co-evaluation PT/OT/SLP Co-Evaluation/Treatment: Yes Reason for Co-Treatment: Necessary to address cognition/behavior during functional activity PT goals addressed during session: Mobility/safety with mobility;Balance OT goals addressed during session: Strengthening/ROM;ADL's and self-care      AM-PAC OT "6  Clicks" Daily Activity     Outcome Measure Help from another person eating meals?: A Little Help from another person taking care of personal grooming?: A Little Help from another person toileting, which includes using toliet, bedpan, or urinal?: A Little Help from another person bathing (including washing, rinsing, drying)?: A Little Help from another person to put on and taking off  regular upper body clothing?: A Little Help from another person to put on and taking off regular lower body clothing?: A Little 6 Click Score: 18   End of Session Equipment Utilized During Treatment: Gait belt Nurse Communication: Mobility status  Activity Tolerance: Patient tolerated treatment well Patient left: in chair;with call bell/phone within reach (chair safety belt)  OT Visit Diagnosis: Unsteadiness on feet (R26.81);Other abnormalities of gait and mobility (R26.89);History of falling (Z91.81);Other symptoms and signs involving cognitive function                Time: 3845-3646 OT Time Calculation (min): 30 min Charges:  OT General Charges $OT Visit: 1 Visit OT Evaluation $OT Eval Moderate Complexity: 1 Mod  [address], OTR/L Acute [employer] [facility] [phone] Office [phone]    Almon Register 02/01/2022, 11:06 AM

## 2022-02-02 ENCOUNTER — Inpatient Hospital Stay (HOSPITAL_COMMUNITY): Payer: Medicaid Other

## 2022-02-02 DIAGNOSIS — Z515 Encounter for palliative care: Secondary | ICD-10-CM | POA: Diagnosis not present

## 2022-02-02 DIAGNOSIS — G9389 Other specified disorders of brain: Secondary | ICD-10-CM | POA: Diagnosis not present

## 2022-02-02 DIAGNOSIS — R5383 Other fatigue: Secondary | ICD-10-CM

## 2022-02-02 DIAGNOSIS — I959 Hypotension, unspecified: Secondary | ICD-10-CM

## 2022-02-02 DIAGNOSIS — K661 Hemoperitoneum: Secondary | ICD-10-CM

## 2022-02-02 DIAGNOSIS — J9601 Acute respiratory failure with hypoxia: Secondary | ICD-10-CM | POA: Diagnosis not present

## 2022-02-02 HISTORY — PX: IR IVC FILTER PLMT / S&I /IMG GUID/MOD SED: IMG701

## 2022-02-02 LAB — BASIC METABOLIC PANEL
Anion gap: 8 (ref 5–15)
BUN: 30 mg/dL — ABNORMAL HIGH (ref 8–23)
CO2: 15 mmol/L — ABNORMAL LOW (ref 22–32)
Calcium: 7.4 mg/dL — ABNORMAL LOW (ref 8.9–10.3)
Chloride: 108 mmol/L (ref 98–111)
Creatinine, Ser: 1.28 mg/dL — ABNORMAL HIGH (ref 0.44–1.00)
GFR, Estimated: 46 mL/min — ABNORMAL LOW (ref >60)
Glucose, Bld: 230 mg/dL — ABNORMAL HIGH (ref 70–99)
Potassium: 4.1 mmol/L (ref 3.5–5.1)
Sodium: 131 mmol/L — ABNORMAL LOW (ref 135–145)

## 2022-02-02 LAB — CBC WITH DIFFERENTIAL/PLATELET
Abs Immature Granulocytes: 0.45 10*3/uL — ABNORMAL HIGH (ref 0.00–0.07)
Basophils Absolute: 0 10*3/uL (ref 0.0–0.1)
Basophils Relative: 0 %
Eosinophils Absolute: 0 10*3/uL (ref 0.0–0.5)
Eosinophils Relative: 0 %
HCT: 20 % — ABNORMAL LOW (ref 36.0–46.0)
Hemoglobin: 6.3 g/dL — CL (ref 12.0–15.0)
Immature Granulocytes: 3 %
Lymphocytes Relative: 7 %
Lymphs Abs: 1.2 10*3/uL (ref 0.7–4.0)
MCH: 20.8 pg — ABNORMAL LOW (ref 26.0–34.0)
MCHC: 31.5 g/dL (ref 30.0–36.0)
MCV: 66 fL — ABNORMAL LOW (ref 80.0–100.0)
Monocytes Absolute: 1.2 10*3/uL — ABNORMAL HIGH (ref 0.1–1.0)
Monocytes Relative: 7 %
Neutro Abs: 14 10*3/uL — ABNORMAL HIGH (ref 1.7–7.7)
Neutrophils Relative %: 83 %
Platelets: 450 10*3/uL — ABNORMAL HIGH (ref 150–400)
RBC: 3.03 MIL/uL — ABNORMAL LOW (ref 3.87–5.11)
RDW: 21.8 % — ABNORMAL HIGH (ref 11.5–15.5)
WBC: 16.8 10*3/uL — ABNORMAL HIGH (ref 4.0–10.5)
nRBC: 0.9 % — ABNORMAL HIGH (ref 0.0–0.2)

## 2022-02-02 LAB — POCT I-STAT 7, (LYTES, BLD GAS, ICA,H+H)
Acid-base deficit: 9 mmol/L — ABNORMAL HIGH (ref 0.0–2.0)
Bicarbonate: 15 mmol/L — ABNORMAL LOW (ref 20.0–28.0)
Calcium, Ion: 1.13 mmol/L — ABNORMAL LOW (ref 1.15–1.40)
HCT: 21 % — ABNORMAL LOW (ref 36.0–46.0)
Hemoglobin: 7.1 g/dL — ABNORMAL LOW (ref 12.0–15.0)
O2 Saturation: 94 %
Patient temperature: 38.03
Potassium: 4.2 mmol/L (ref 3.5–5.1)
Sodium: 133 mmol/L — ABNORMAL LOW (ref 135–145)
TCO2: 16 mmol/L — ABNORMAL LOW (ref 22–32)
pCO2 arterial: 25.9 mmHg — ABNORMAL LOW (ref 32–48)
pH, Arterial: 7.376 (ref 7.35–7.45)
pO2, Arterial: 74 mmHg — ABNORMAL LOW (ref 83–108)

## 2022-02-02 LAB — CBC
HCT: 27 % — ABNORMAL LOW (ref 36.0–46.0)
HCT: 20.8 % — ABNORMAL LOW (ref 36.0–46.0)
Hemoglobin: 8.9 g/dL — ABNORMAL LOW (ref 12.0–15.0)
Hemoglobin: 6.9 g/dL — CL (ref 12.0–15.0)
MCH: 25.1 pg — ABNORMAL LOW (ref 26.0–34.0)
MCH: 25.4 pg — ABNORMAL LOW (ref 26.0–34.0)
MCHC: 33 g/dL (ref 30.0–36.0)
MCHC: 33.2 g/dL (ref 30.0–36.0)
MCV: 76.1 fL — ABNORMAL LOW (ref 80.0–100.0)
MCV: 76.5 fL — ABNORMAL LOW (ref 80.0–100.0)
Platelets: 308 10*3/uL (ref 150–400)
Platelets: 241 10*3/uL (ref 150–400)
RBC: 3.55 MIL/uL — ABNORMAL LOW (ref 3.87–5.11)
RBC: 2.72 MIL/uL — ABNORMAL LOW (ref 3.87–5.11)
RDW: 24.7 % — ABNORMAL HIGH (ref 11.5–15.5)
RDW: 23.6 % — ABNORMAL HIGH (ref 11.5–15.5)
WBC: 25.3 10*3/uL — ABNORMAL HIGH (ref 4.0–10.5)
WBC: 24.2 10*3/uL — ABNORMAL HIGH (ref 4.0–10.5)
nRBC: 1.5 % — ABNORMAL HIGH (ref 0.0–0.2)
nRBC: 3.8 % — ABNORMAL HIGH (ref 0.0–0.2)

## 2022-02-02 LAB — COMPREHENSIVE METABOLIC PANEL
ALT: 28 U/L (ref 0–44)
AST: 21 U/L (ref 15–41)
Albumin: 2.3 g/dL — ABNORMAL LOW (ref 3.5–5.0)
Alkaline Phosphatase: 35 U/L — ABNORMAL LOW (ref 38–126)
Anion gap: 7 (ref 5–15)
BUN: 22 mg/dL (ref 8–23)
CO2: 22 mmol/L (ref 22–32)
Calcium: 7.9 mg/dL — ABNORMAL LOW (ref 8.9–10.3)
Chloride: 102 mmol/L (ref 98–111)
Creatinine, Ser: 0.7 mg/dL (ref 0.44–1.00)
GFR, Estimated: >60 mL/min (ref >60)
Glucose, Bld: 223 mg/dL — ABNORMAL HIGH (ref 70–99)
Potassium: 3.7 mmol/L (ref 3.5–5.1)
Sodium: 131 mmol/L — ABNORMAL LOW (ref 135–145)
Total Bilirubin: 0.5 mg/dL (ref 0.3–1.2)
Total Protein: 4.9 g/dL — ABNORMAL LOW (ref 6.5–8.1)

## 2022-02-02 LAB — PREPARE RBC (CROSSMATCH)

## 2022-02-02 LAB — LACTIC ACID, PLASMA
Lactic Acid, Venous: 1.8 mmol/L (ref 0.5–1.9)
Lactic Acid, Venous: 2 mmol/L (ref 0.5–1.9)
Lactic Acid, Venous: 3.8 mmol/L (ref 0.5–1.9)

## 2022-02-02 LAB — GLUCOSE, CAPILLARY
Glucose-Capillary: 220 mg/dL — ABNORMAL HIGH (ref 70–99)
Glucose-Capillary: 206 mg/dL — ABNORMAL HIGH (ref 70–99)
Glucose-Capillary: 233 mg/dL — ABNORMAL HIGH (ref 70–99)
Glucose-Capillary: 297 mg/dL — ABNORMAL HIGH (ref 70–99)
Glucose-Capillary: 215 mg/dL — ABNORMAL HIGH (ref 70–99)

## 2022-02-02 LAB — MAGNESIUM: Magnesium: 1.7 mg/dL (ref 1.7–2.4)

## 2022-02-02 LAB — TROPONIN I (HIGH SENSITIVITY)
Troponin I (High Sensitivity): 160 ng/L (ref <18)
Troponin I (High Sensitivity): 126 ng/L (ref <18)

## 2022-02-02 LAB — HEPARIN LEVEL (UNFRACTIONATED): Heparin Unfractionated: 0.9 IU/mL — ABNORMAL HIGH (ref 0.30–0.70)

## 2022-02-02 LAB — BRAIN NATRIURETIC PEPTIDE: B Natriuretic Peptide: 251 pg/mL — ABNORMAL HIGH (ref 0.0–100.0)

## 2022-02-02 LAB — PROTIME-INR
INR: 1.8 — ABNORMAL HIGH (ref 0.8–1.2)
Prothrombin Time: 20.6 seconds — ABNORMAL HIGH (ref 11.4–15.2)

## 2022-02-02 IMAGING — US IR IVC FILTER PLMT / S&I /IMG GUID/MOD SED
5 of 6 series · 15 of 24 positions shown · non-contrast
Comparison: none

CLINICAL DATA: sub massive pulmonary emboli, CNS neoplasm, GI bleed
a relative contraindication to anticoagulation. Caval filtration
requested.

EXAM:
INFERIOR VENACAVOGRAM
LEFT RENAL VENOGRAM
IVC FILTER PLACEMENT UNDER FLUOROSCOPY
FLUOROSCOPY:
Radiation Exposure Index (as provided by the fluoroscopic device):
100 mGy air Kerma
TECHNIQUE: Patency of the right IJ vein was confirmed with ultrasound with
image documentation. An appropriate skin site was determined. Skin
site was marked, prepped with chlorhexidine, and draped using
maximum barrier technique. The region was infiltrated locally with
1% lidocaine.

[Series 1: ir ivc filter plmt / s&i /img guid/mod sed · 1 of 1 slices shown]
[im 1/1]
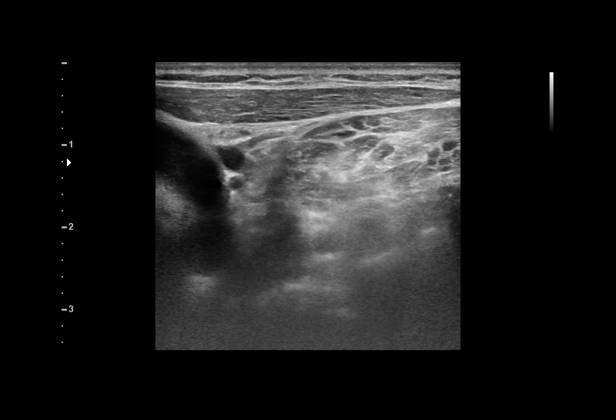

[Series 2: body 4 care · 7 acquisitions, 4 frames shown (1 of 4)]
[im 1/7]
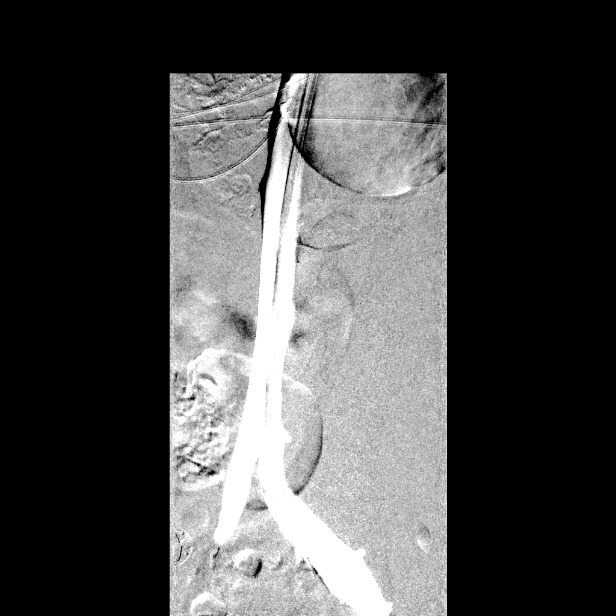
[im 2/7]
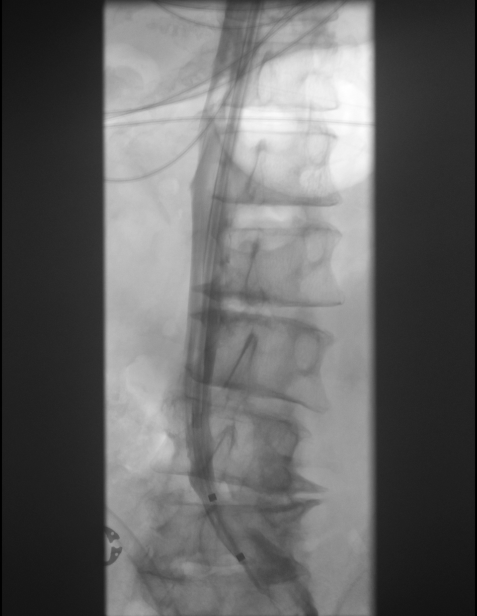
[im 3/7]
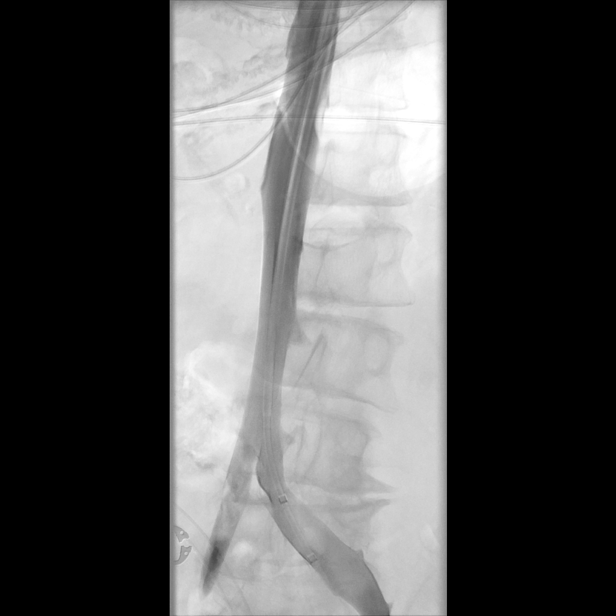
[im 7/7]
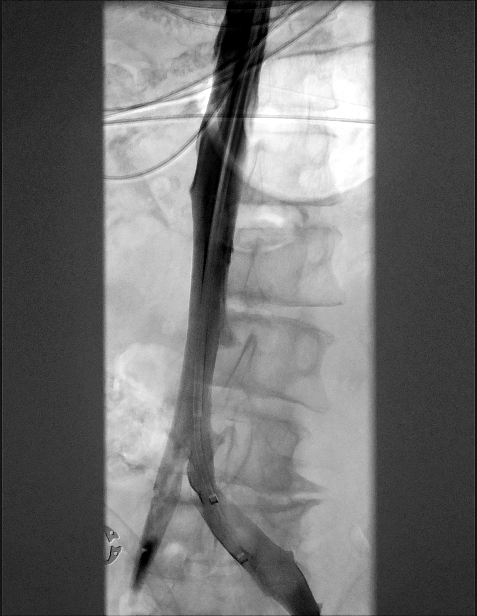

[Series 3: body 4 care · 6 acquisitions, 4 frames shown (2 of 4)]
[im 1/6]
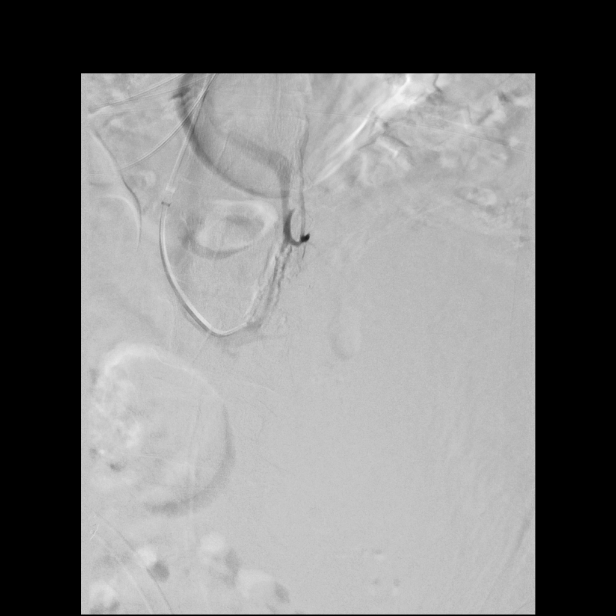
[im 2/6]
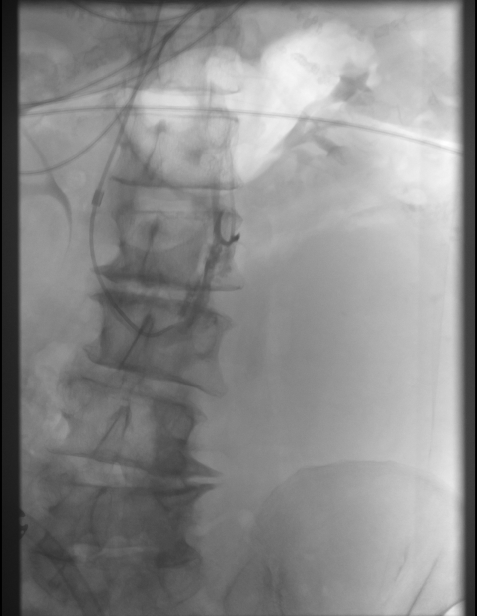
[im 3/6]
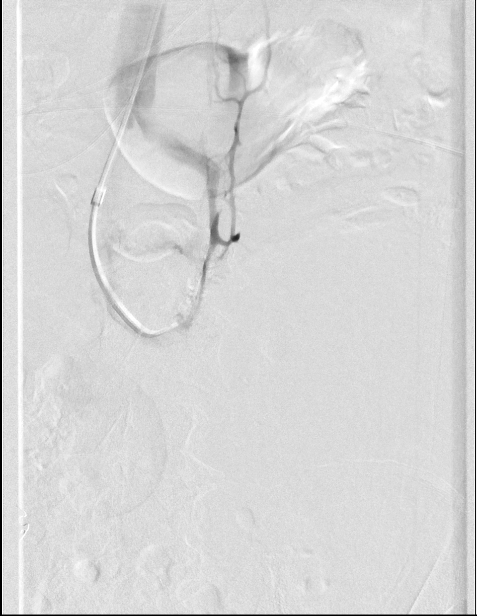
[im 5/6]
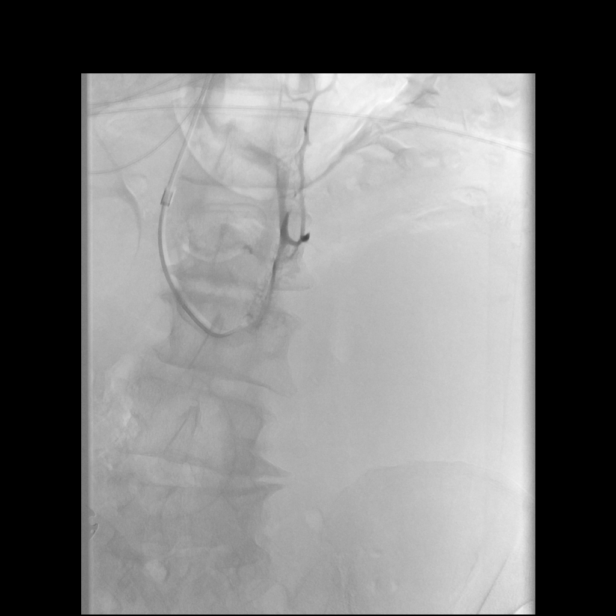

[Series 5: body 4 care · 2 acquisitions, 2 frames shown (3 of 4)]
[im 1/2]
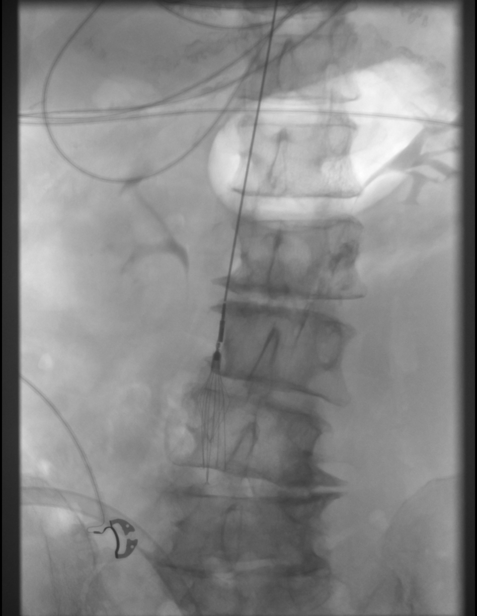
[im 2/2]
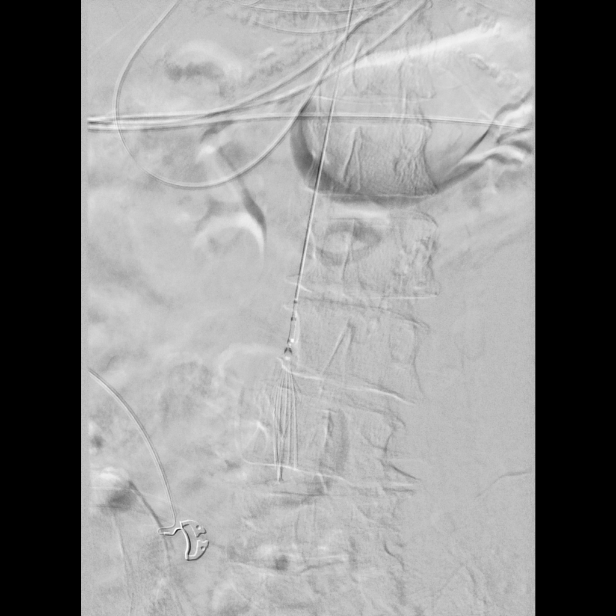

[Series 6: body 4 care · 6 acquisitions, 4 frames shown (4 of 4)]
[im 1/6]
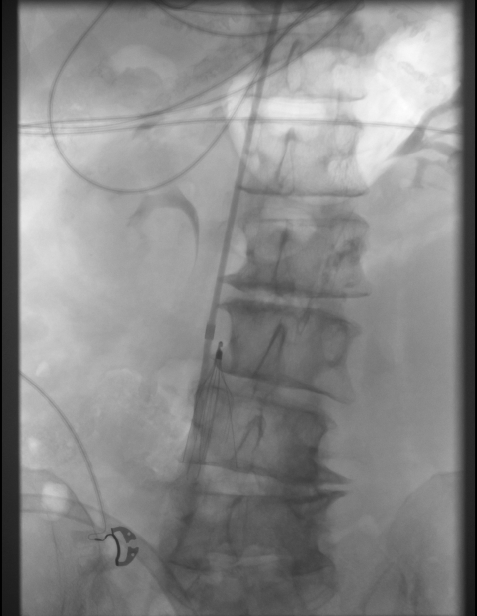
[im 2/6]
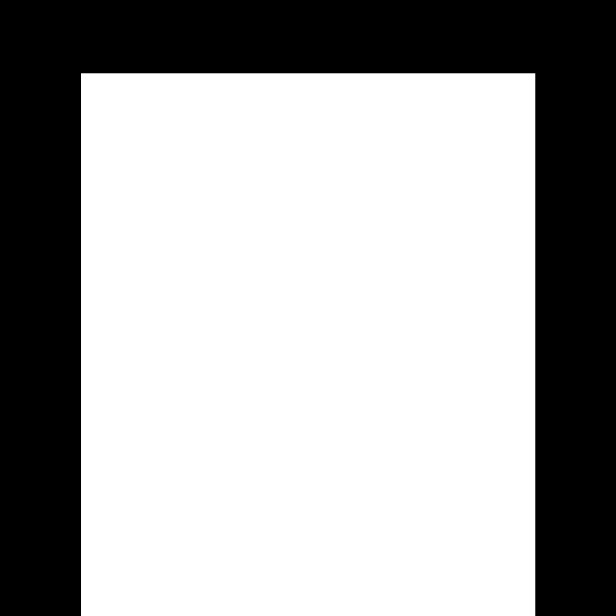
[im 2/6]
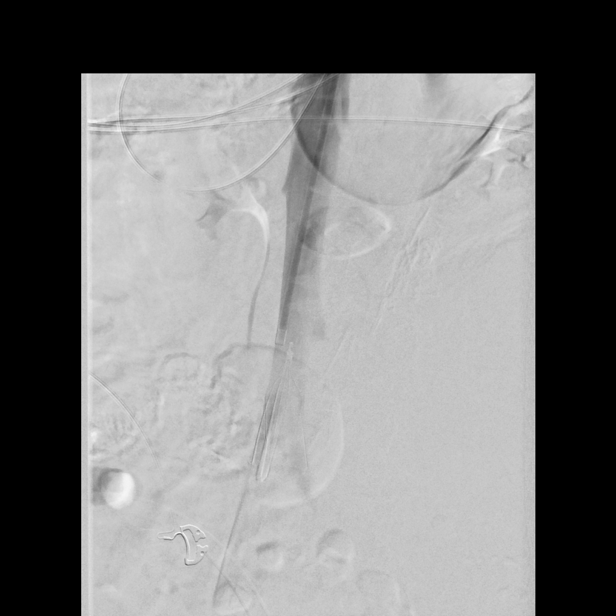
[im 6/6]
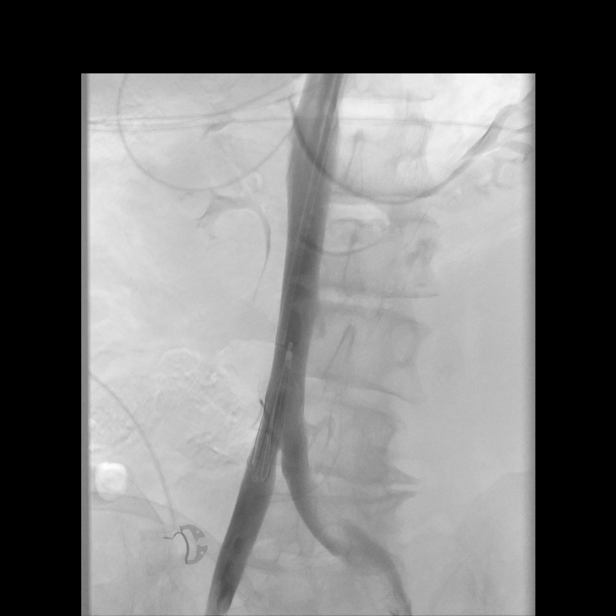

[15 of 24 positions shown; findings below may reference images not displayed]

Intravenous versed 1mg were administered as conscious sedation
during continuous monitoring of the patient's level of consciousness
and physiological / cardiorespiratory status by the radiology RN,
with a total moderate sedation time of 20 minutes. Under real-time
ultrasound guidance, the right IJ vein was accessed with a 21 gauge
micropuncture needle; the needle tip within the vein was confirmed
with ultrasound image documentation. The needle was exchanged over a
018 guidewire for a transitional dilator, which allow advancement of
the Benson wire into the IVC. A long 6 French vascular sheath was
placed for inferior venacavography. This demonstrated no caval
thrombus. Renal vein inflows were evident bilaterally at the level
of the T12-L1 interspace. Circumaortic left renal vein was
suspected, within inferior moiety inflow at the L3 level. 5 French
C2 catheter was advanced through the sheath and used to catheterize
the inferior left renal vein for venography demonstrating confluence
with the dominant superior left renal vein. A small focus of left
retroperitoneal venous extravasation was noted.

The Denali IVC filter was advanced through the sheath and
successfully deployed under fluoroscopy at the L4 level of the
infrarenal IVC with legs extending across the iliac venous
confluence. Followup cavagram demonstrates stable filter position
and no evident complication. The sheath was removed and hemostasis
achieved at the site. No immediate complication.
IMPRESSION: 1. No evidence of IVC thrombus.
2. Circumaortic left renal vein, an anatomic variant.
3. Technically successful infrarenal IVC filter placement. This is a
retrievable model.

PLAN:
This IVC filter is potentially retrievable. The patient will be
approximately 8-12 weeks. Further recommendations regarding filter
retrieval, continued surveillance or declaration of device
permanence, will be made at that time.

## 2022-02-02 MED ORDER — SODIUM CHLORIDE 0.9 % IV BOLUS
500.0000 mL | Freq: Once | INTRAVENOUS | Status: AC | PRN
Start: 2022-02-02 — End: 2022-02-02
  Administered 2022-02-02: 500 mL via INTRAVENOUS

## 2022-02-02 MED ORDER — SODIUM CHLORIDE 0.9% IV SOLUTION: Freq: Once | INTRAVENOUS | Status: AC

## 2022-02-02 MED ORDER — CHLORHEXIDINE GLUCONATE 0.12 % MT SOLN
15.0000 mL | Freq: Two times a day (BID) | OROMUCOSAL | Status: DC
  Administered 2022-02-02 – 2022-02-06 (×9): 15 mL via OROMUCOSAL
  Filled 2022-02-02 (×3): qty 15

## 2022-02-02 MED ORDER — LIDOCAINE HCL 1 % IJ SOLN
INTRAMUSCULAR | Status: AC
  Filled 2022-02-02: qty 20

## 2022-02-02 MED ORDER — ORAL CARE MOUTH RINSE
15.0000 mL | Freq: Two times a day (BID) | OROMUCOSAL | Status: DC
  Administered 2022-02-03 – 2022-02-05 (×6): 15 mL via OROMUCOSAL

## 2022-02-02 MED ORDER — SODIUM CHLORIDE 0.9 % IV BOLUS
500.0000 mL | Freq: Once | INTRAVENOUS | Status: AC
  Administered 2022-02-02: 500 mL via INTRAVENOUS

## 2022-02-02 MED ORDER — INSULIN ASPART 100 UNIT/ML IJ SOLN
0.0000 [IU] | INTRAMUSCULAR | Status: DC
  Administered 2022-02-02 (×2): 3 [IU] via SUBCUTANEOUS
  Administered 2022-02-02: 5 [IU] via SUBCUTANEOUS
  Administered 2022-02-03: 2 [IU] via SUBCUTANEOUS
  Administered 2022-02-03: 1 [IU] via SUBCUTANEOUS
  Administered 2022-02-03: 2 [IU] via SUBCUTANEOUS
  Administered 2022-02-03: 1 [IU] via SUBCUTANEOUS
  Administered 2022-02-03 (×2): 2 [IU] via SUBCUTANEOUS
  Administered 2022-02-04: 1 [IU] via SUBCUTANEOUS
  Administered 2022-02-04: 2 [IU] via SUBCUTANEOUS

## 2022-02-02 MED ORDER — LEVOFLOXACIN IN D5W 500 MG/100ML IV SOLN
500.0000 mg | INTRAVENOUS | Status: DC
  Administered 2022-02-02: 500 mg via INTRAVENOUS
  Filled 2022-02-02 (×2): qty 100

## 2022-02-02 MED ORDER — NOREPINEPHRINE 4 MG/250ML-% IV SOLN
INTRAVENOUS | Status: AC
  Filled 2022-02-02: qty 250

## 2022-02-02 MED ORDER — HALOPERIDOL LACTATE 5 MG/ML IJ SOLN
2.0000 mg | Freq: Four times a day (QID) | INTRAMUSCULAR | Status: DC | PRN
  Administered 2022-02-02: 2 mg via INTRAVENOUS
  Filled 2022-02-02: qty 1

## 2022-02-02 MED ORDER — SODIUM CHLORIDE 0.9 % IV SOLN: 250.0000 mL | INTRAVENOUS | Status: DC

## 2022-02-02 MED ORDER — MORPHINE SULFATE (PF) 2 MG/ML IV SOLN
0.2500 mg | Freq: Once | INTRAVENOUS | Status: AC
  Administered 2022-02-02: 0.25 mg via INTRAVENOUS
  Filled 2022-02-02: qty 1

## 2022-02-02 MED ORDER — SODIUM CHLORIDE 0.9% IV SOLUTION
Freq: Once | INTRAVENOUS | Status: AC
Start: 2022-02-02 — End: 2022-02-02

## 2022-02-02 MED ORDER — SODIUM CHLORIDE 0.9% IV SOLUTION: Freq: Once | INTRAVENOUS | Status: DC

## 2022-02-02 MED ORDER — MAGNESIUM SULFATE 2 GM/50ML IV SOLN
2.0000 g | Freq: Once | INTRAVENOUS | Status: AC
  Administered 2022-02-02: 2 g via INTRAVENOUS
  Filled 2022-02-02: qty 50

## 2022-02-02 MED ORDER — NOREPINEPHRINE 4 MG/250ML-% IV SOLN: 0.0000 ug/min | INTRAVENOUS | Status: DC

## 2022-02-02 MED ORDER — STERILE WATER FOR INJECTION IV SOLN
INTRAVENOUS | Status: DC
  Filled 2022-02-02 (×4): qty 1000

## 2022-02-02 MED ORDER — ONDANSETRON HCL 4 MG/2ML IJ SOLN
4.0000 mg | Freq: Four times a day (QID) | INTRAMUSCULAR | Status: DC | PRN
Start: 2022-02-02 — End: 2022-04-01
  Administered 2022-02-02 – 2022-02-12 (×3): 4 mg via INTRAVENOUS
  Filled 2022-02-02 (×3): qty 2

## 2022-02-02 MED ORDER — SODIUM CHLORIDE 0.9 % IV BOLUS
1000.0000 mL | Freq: Once | INTRAVENOUS | Status: AC
  Administered 2022-02-02: 1000 mL via INTRAVENOUS

## 2022-02-02 MED ORDER — NOREPINEPHRINE 4 MG/250ML-% IV SOLN
2.0000 ug/min | INTRAVENOUS | Status: DC
  Administered 2022-02-02: 6 ug/min via INTRAVENOUS
  Administered 2022-02-02: 3 ug/min via INTRAVENOUS
  Administered 2022-02-02: 2 ug/min via INTRAVENOUS
  Administered 2022-02-02: 9 ug/min via INTRAVENOUS
  Administered 2022-02-02: 4 ug/min via INTRAVENOUS
  Filled 2022-02-02: qty 250

## 2022-02-02 MED ORDER — MIDAZOLAM HCL 2 MG/2ML IJ SOLN
INTRAMUSCULAR | Status: AC
  Filled 2022-02-02: qty 2

## 2022-02-02 MED ORDER — SODIUM CHLORIDE 0.9 % IV SOLN: INTRAVENOUS | Status: DC

## 2022-02-02 MED ORDER — IOHEXOL 300 MG/ML  SOLN
100.0000 mL | Freq: Once | INTRAMUSCULAR | Status: AC | PRN
  Administered 2022-02-02: 40 mL via INTRAVENOUS

## 2022-02-02 MED ORDER — MIDAZOLAM HCL 2 MG/2ML IJ SOLN
INTRAMUSCULAR | Status: AC | PRN
  Administered 2022-02-02: 1 mg via INTRAVENOUS

## 2022-02-02 MED ORDER — PANTOPRAZOLE SODIUM 40 MG IV SOLR
40.0000 mg | Freq: Two times a day (BID) | INTRAVENOUS | Status: DC
  Administered 2022-02-02 – 2022-02-06 (×10): 40 mg via INTRAVENOUS
  Filled 2022-02-02 (×10): qty 10

## 2022-02-02 MED ORDER — ONDANSETRON HCL 4 MG/2ML IJ SOLN
4.0000 mg | Freq: Once | INTRAMUSCULAR | Status: AC
  Administered 2022-02-02: 4 mg via INTRAVENOUS
  Filled 2022-02-02: qty 2

## 2022-02-02 MED ORDER — IOHEXOL 300 MG/ML  SOLN: 100.0000 mL | Freq: Once | INTRAMUSCULAR | Status: DC | PRN

## 2022-02-02 NOTE — Significant Event (Addendum)
Rapid Response Event Note   Reason for Call : Hypotension in setting of submassive PE and possible septic shock Initial Focused Assessment:  I was notified by nursing staff of pt with low BP in 80s and PE on heparin gtt. Upon arrival, pt is alert, pleasantly confused (baseline since admission). Skin is pale, dry and warm. Palpable pulses in all 4 extremities. Cap refil >3 secs. Pt is not tachycardic or hypoxic (HR 70-80 SR). Prior to my arrival, RN received order for NS 500cc bolus from Dr. Hal Hope. NS bolus infusing when I arrived. Pt is responding well to bolus.   0547-97.56F, HR 80 SR, 117/85 (96), RR 29 with sats 98%.     Interventions:  -NS bolus 500cc per TRH  Plan of Care:  -Reassess response to bolus -Notify MD for further orders or clinical change -Notify RRRN for any further assistance   Addendum-0642 Additional NS bolus initiated due to BP 70s following initial bolus. Hgb 6.3. MD notified and awaiting orders. Crossmatch current until 6/5 2359.    MD Notified: Dr. Hal Hope per primary RN Call Time: 0532 Arrival Time: [place] End Time:0610  Madelynn Done, RN

## 2022-02-02 NOTE — Progress Notes (Signed)
Date and time results received: 02/02/22 0830 (use smartphrase ".now" to insert current time)  Test: Lactic Acid  Critical Value: 2.0  Name of Provider Notified: Lynetta Mare, MD   Orders Received? Or Actions Taken?: No new orders

## 2022-02-02 NOTE — Progress Notes (Addendum)
0520 Paged hospitalist Dr. Hal Hope due to complaints of nausea, pale skin, and hypotension.   9643 new orders for iv fluid bolus  0535 called Rapid response to evaluate pt.   0540 lactic acid and troponin drawn   0555 pt bp improving with IVF

## 2022-02-02 NOTE — Progress Notes (Incomplete)
Daily Rounding Note  02/02/2022, 12:02 PM  LOS: 3 days   SUBJECTIVE:   Chief complaint:     ***  OBJECTIVE:         Vital signs in last 24 hours:    Temp:  [96.1 F (35.6 C)-98.6 F (37 C)] 96.1 F (35.6 C) (06/05 1148) Pulse Rate:  [65-97] 91 (06/05 1100) Resp:  [15-29] 23 (06/05 1100) BP: (65-173)/(51-141) 95/69 (06/05 1100) SpO2:  [91 %-100 %] 96 % (06/05 1100) Last BM Date : 01/30/22 Filed Weights   01/30/22 2053 01/31/22 0355 01/31/22 1100  Weight: 69.5 kg 69.5 kg 69.5 kg   General: ***   Heart: *** Chest: *** Abdomen: ***  Extremities: *** Neuro/Psych:  ***  Intake/Output from previous day: 06/04 0701 - 06/05 0700 In: 1446.9 [I.V.:516.5; IV Piggyback:930.4] Out: 1400 [Urine:1400]  Intake/Output this shift: Total I/O In: 1455.2 [I.V.:674.3; Blood:574.2; IV Piggyback:206.7] Out: -   Lab Results: Recent Labs    01/31/22 1025 02/01/22 0248 02/02/22 0514  WBC 13.5* 16.6* 16.8*  HGB 8.1* 8.2* 6.3*  HCT 25.8* 26.2* 20.0*  PLT 452* 471* 450*   BMET Recent Labs    01/31/22 0630 02/01/22 0248 02/02/22 0514  NA 133* 133* 131*  K 4.3 3.7 3.7  CL 105 106 102  CO2 20* 21* 22  GLUCOSE 135* 180* 223*  BUN '23 23 22  '$ CREATININE 0.58 0.66 0.70  CALCIUM 8.3* 8.5* 7.9*   LFT Recent Labs    01/30/22 1211 01/30/22 2315 02/02/22 0514  PROT 7.4 6.3* 4.9*  ALBUMIN 3.4* 2.8* 2.3*  AST 69* 59* 21  ALT 35 37 28  ALKPHOS 60 52 35*  BILITOT 0.7 0.5 0.5   PT/INR Recent Labs    01/30/22 1211  LABPROT 16.1*  INR 1.3*   Hepatitis Panel No results for input(s): HEPBSAG, HCVAB, HEPAIGM, HEPBIGM in the last 72 hours.  Studies/Results: DG Knee 1-2 Views Left  Result Date: 02/01/2022 CLINICAL DATA:  Acute left knee pain.  No known injury. EXAM: LEFT KNEE - 2 VIEW COMPARISON:  None Available. FINDINGS: No evidence of fracture, dislocation, or joint effusion. No evidence of arthropathy or other focal  bone abnormality. Soft tissues are unremarkable. IMPRESSION: Negative. Electronically Signed   By: Marlaine Hind M.D.   On: 02/01/2022 09:22   MR BRAIN WO CONTRAST  Result Date: 01/31/2022 CLINICAL DATA:  Mental status change.  Follow-up brain tumor. EXAM: MRI HEAD WITHOUT CONTRAST TECHNIQUE: Multiplanar, multiecho pulse sequences of the brain and surrounding structures were obtained without intravenous contrast. COMPARISON:  Head CT yesterday. FINDINGS: Brain: The patient was disoriented and would not hold still for scanning. Contrast enhanced imaging was not achieved. The only coronal imaging achieve was the diffusion study. Medication or anesthesia may be necessary. The brainstem and cerebellum are normal. Both cerebral hemispheres show chronic appearing small vessel ischemic changes of the white matter. There is an approximately 5.2 x 5.3 cm mass lesion in the Peri insular brain on the right, exact site of origin not determined, but temporal lobe is favored. I would say there is some possibility that this could even be an extra-axial mass lesion. The surrounding brain shows pronounced vasogenic edema. There is mass effect with right-to-left shift of 11 mm. No hydrocephalus/ventricular trapping. Vascular: Major vessels at the base of the brain show flow. Skull and upper cervical spine: Negative Sinuses/Orbits: Clear/normal Other: None IMPRESSION: Limited and abbreviated study because of the patient's condition and patient  motion. The study confirms the presence of an approximately 5.2 x 5.3 cm mass on the right. I think this could be intra-axial, probably arising from the temporal insula, but I think there is some potential that it could be an extra-axial mass. No evidence of necrosis or pronounced heterogeneity. Mass effect with right-to-left shift 11 mm. Vasogenic edema within the right hemisphere. Chronic small-vessel ischemic changes of the cerebral hemispheric white matter. Electronically Signed   By: Nelson Chimes M.D.   On: 01/31/2022 15:51   DG HIP UNILAT WITH PELVIS 2-3 VIEWS LEFT  Result Date: 02/01/2022 CLINICAL DATA:  Acute left hip pain. EXAM: DG HIP (WITH OR WITHOUT PELVIS) 2-3V LEFT COMPARISON:  None Available. FINDINGS: No evidence of left hip fracture or dislocation. Severe left hip osteoarthritis is seen, with chronic collapse of the femoral head, geode formation, and chronic bony remodeling. IMPRESSION: No acute findings. End-stage left hip osteoarthritis. Electronically Signed   By: Marlaine Hind M.D.   On: 02/01/2022 09:21    Scheduled Meds:  sodium chloride   Intravenous Once   Chlorhexidine Gluconate Cloth  6 each Topical Q0600   dexamethasone (DECADRON) injection  10 mg Intravenous Q6H   levofloxacin  500 mg Oral Daily   mouth rinse  15 mL Mouth Rinse BID   pantoprazole (PROTONIX) IV  40 mg Intravenous Q12H   Continuous Infusions:  sodium chloride Stopped (02/02/22 0929)   sodium chloride Stopped (02/02/22 1044)   heparin Stopped (02/02/22 0732)   norepinephrine (LEVOPHED) Adult infusion Stopped (02/02/22 1045)   PRN Meds:.acetaminophen, calcium carbonate, docusate sodium, haloperidol lactate, polyethylene glycol  ASSESMENT:   Esophagitis,? Esoph mass per CT  Submassive PE.  Heparin drip in place.  Right brain mass, with mass effect   PLAN   ***    Azucena Freed  02/02/2022, 12:02 PM Phone [phone]

## 2022-02-02 NOTE — Plan of Care (Signed)

## 2022-02-02 NOTE — Progress Notes (Signed)
Beside handoff report done with day shift.  Pt tolerating blood products and bp stabilizing. Heaprin drip off per Dr. Candiss Norse

## 2022-02-02 NOTE — Consult Note (Signed)
Chief Complaint: Patient was seen in consultation today for  Chief Complaint  Patient presents with   Hypotension   at the request of Dr. Lala Lund  Supervising Physician: Arne Cleveland  Patient Status: [facility] - In-pt  History of Present Illness:  Jennifer Pacheco is a 66 y.o. 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  With heparin gtt, she had a two point drop in Hgb requiring "urgent transfusion".  IR has been consulted for emergency IVC filter placement, in the setting of hemodynamically significant acute PE and now ongoing upper GI bleed.  No medical POA or next of kin known.  Boyfriend phone is not answered, and so far, voicemails not returned.  Past Medical History:  Diagnosis Date   Hard of hearing     Past Surgical History:  Procedure Laterality Date   BREAST BIOPSY Left 2020   beg    Allergies: Penicillins  Medications: Prior to Admission medications   Medication Sig Start Date End Date Taking? Authorizing Provider  lisinopril (ZESTRIL) 20 MG tablet Take 20 mg by mouth daily. 04/15/20  Yes [provider]  meloxicam (MOBIC) 15 MG tablet Take 15 mg by mouth daily as needed for pain.   Yes [provider]  cyclobenzaprine (FLEXERIL) 10 MG tablet Take by mouth. Patient not taking: Reported on 01/30/2022    [provider]  metFORMIN (GLUCOPHAGE) 500 MG tablet Take by mouth. Patient not taking: Reported on 01/30/2022    [provider]  Omega-3 1000 MG CAPS Take by mouth. Patient not taking: Reported on 01/30/2022    [provider]     Family History  Problem Relation Age of Onset   Breast cancer Maternal Aunt     Social History   Socioeconomic History   Marital status: Single    Spouse name: Not on file   Number of children: Not on file   Years  of education: Not on file   Highest education level: Not on file  Occupational History   Not on file  Tobacco Use   Smoking status: Never   Smokeless tobacco: Never  Substance and Sexual Activity   Alcohol use: Not Currently   Drug use: Yes    Types: Marijuana   Sexual activity: Yes    Partners: Male  Other Topics Concern   Not on file  Social History Narrative   Not on file   Social Determinants of Health   Financial Resource Strain: Not on file  Food Insecurity: Not on file  Transportation Needs: Not on file  Physical Activity: Not on file  Stress: Not on file  Social Connections: Not on file    Review of Systems: unable to obtain full ROS due to clinical/mental status.  She does endorse abdominal pain and nausea at this time.   Vital Signs: BP (!) 82/67 (BP Location: Right Arm)   Pulse 95   Temp (!) 96.1 F (35.6 C) (Axillary) Comment: informed Comptroller, bear hugger applied  Resp (!) 25   Ht '5\' 5"'$  (1.651 m)   Wt 153 lb 3.5 oz (69.5 kg)   SpO2 96%   BMI 25.50 kg/m   Physical Exam Vitals and nursing note reviewed.  Constitutional:      Appearance: She is ill-appearing.  HENT:     Head: Normocephalic and atraumatic.     Mouth/Throat:  Pharynx: Oropharynx is clear.  Eyes:     Extraocular Movements: Extraocular movements intact.  Cardiovascular:     Rate and Rhythm: Normal rate.     Pulses: Decreased pulses.  Pulmonary:     Effort: Pulmonary effort is normal.     Breath sounds: Normal breath sounds.  Abdominal:     General: Abdomen is flat.     Palpations: Abdomen is soft.  Skin:    General: Skin is cool.     Coloration: Skin is pale and sallow.  Neurological:     Mental Status: She is alert.     Comments: Answers some questions appropriately, some inappropriately, and some not at all.    Psychiatric:        Behavior: Behavior is uncooperative.    Imaging: DG Chest 1 View  Result Date: 01/30/2022 CLINICAL DATA:  Altered mental status.  Fall.  EXAM: CHEST  1 VIEW COMPARISON:  None available FINDINGS: Cardiac silhouette and mediastinal contours within normal limits. The lungs are clear. No pleural effusion or pneumothorax. Mild dextrocurvature of the midthoracic spine with moderate multilevel degenerative disc changes. IMPRESSION: No active disease. Electronically Signed   By: Yvonne Kendall M.D.   On: 01/30/2022 14:08   DG Lumbar Spine Complete  Result Date: 01/30/2022 CLINICAL DATA:  Fall.  Pain. EXAM: LUMBAR SPINE - COMPLETE 4+ VIEW COMPARISON:  None available FINDINGS: There are 5 non-rib-bearing lumbar-type vertebral bodies. Minimal levocurvature centered at L2-3 with moderate right L2-3 disc space narrowing, endplate sclerosis, vacuum phenomenon, and peripheral osteophytosis. Mild right L3-4 and severe left L4-5 disc space narrowing. Large left lateral L4-5 endplate osteophytes. No sagittal spondylolisthesis. Vertebral body heights are maintained. Severe left femoroacetabular osteoarthritis with moderate to high-grade superior femoral head bone loss. IMPRESSION: Moderate multilevel degenerative disc and endplate changes greatest at the left L4-5 diffuse L5-S1 and right L2-3 levels. Electronically Signed   By: Yvonne Kendall M.D.   On: 01/30/2022 14:11   DG Knee 1-2 Views Left  Result Date: 02/01/2022 CLINICAL DATA:  Acute left knee pain.  No known injury. EXAM: LEFT KNEE - 2 VIEW COMPARISON:  None Available. FINDINGS: No evidence of fracture, dislocation, or joint effusion. No evidence of arthropathy or other focal bone abnormality. Soft tissues are unremarkable. IMPRESSION: Negative. Electronically Signed   By: Marlaine Hind M.D.   On: 02/01/2022 09:22   CT Head Wo Contrast  Result Date: 01/30/2022 CLINICAL DATA:  Provided history: Mental status change, unknown cause. Additional history provided: Mental status change, sepsis EXAM: CT HEAD WITHOUT CONTRAST TECHNIQUE: Contiguous axial images were obtained from the base of the skull through the  vertex without intravenous contrast. RADIATION DOSE REDUCTION: This exam was performed according to the departmental dose-optimization program which includes automated exposure control, adjustment of the mA and/or kV according to patient size and/or use of iterative reconstruction technique. COMPARISON:  None. FINDINGS: Brain: Mild generalized cerebral atrophy. Large subtly hyperdense irregular mass centered in the right basal ganglia, right insula and adjacent right frontotemporal lobes. Measurements are difficult to provide due to the irregularity and ill-defined margins of the mass. Severe surrounding edema within the right cerebral hemisphere. Mass effect with near complete effacement of the right lateral ventricle and 11 mm leftward midline shift measured at the level of the septum pellucidum. Background patchy and ill-defined hypoattenuation within the cerebral white matter, nonspecific but compatible with chronic small vessel ischemic disease. There is no acute intracranial hemorrhage. No demarcated cortical infarct. No extra-axial fluid collection. Vascular: No hyperdense  vessel.  Atherosclerotic calcifications. Skull: No fracture or aggressive osseous lesion. Sinuses/Orbits: No mass or acute finding within the imaged orbits. Frothy secretions and small fluid level within the right sphenoid sinus. These results were called by telephone at the time of interpretation on 01/30/2022 at 2:30 pm to provider Sherwood Gambler , who verbally acknowledged these results. IMPRESSION: Large subtly hyperdense and irregular mass centered in the right basal ganglia, right insula and adjacent right frontotemporal lobes. Severe surrounding edema within the right cerebral hemisphere. Resultant mass effect with near complete effacement of the right lateral ventricle and 11 mm leftward midline shift. 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 A brain MRI without and with contrast is  recommended. Neurosurgical consultation is also recommended. Background chronic small ischemic changes within the cerebral white matter. Mild generalized cerebral atrophy. Right sphenoid sinusitis. Electronically Signed   By: Kellie Simmering D.O.   On: 01/30/2022 14:36   CT Angio Chest PE W and/or Wo Contrast  Result Date: 01/30/2022 CLINICAL DATA:  Altered mental status and sepsis. * Tracking Code: BO * EXAM: CT ANGIOGRAPHY CHEST CT ABDOMEN AND PELVIS WITH CONTRAST TECHNIQUE: Multidetector CT imaging of the chest was performed using the standard protocol during bolus administration of intravenous contrast. Multiplanar CT image reconstructions and MIPs were obtained to evaluate the vascular anatomy. Multidetector CT imaging of the abdomen and pelvis was performed using the standard protocol during bolus administration of intravenous contrast. RADIATION DOSE REDUCTION: This exam was performed according to the departmental dose-optimization program which includes automated exposure control, adjustment of the mA and/or kV according to patient size and/or use of iterative reconstruction technique. CONTRAST:  132m OMNIPAQUE IOHEXOL 350 MG/ML SOLN COMPARISON:  Abdomen evaluation of the same date. FINDINGS: CTA CHEST FINDINGS Cardiovascular: There is thrombus in the RIGHT lower lobe artery with extension into RIGHT middle lobe branches, at the segmental level. There is also thrombus in RIGHT lower lobe branches at the central segmental level. Near occlusive thrombus to posterior RIGHT lower lobe branches. Central segmental involvement of all upper lobe branches on the RIGHT as well. Similar pattern of thrombosis in the LEFT chest also with lower lobe level, lobar level embolus perhaps bridging into upper lobe branches and filling lower lobe branches as well, lobar and segmental level emboli throughout the LEFT chest. There are multiple segmental level thrombi in the LEFT lower lobe, lingular branch and upper lobe  branches. RV to LV ratio is increased to between 1.24 and 1.54 depending on level of measurement. Marked straightening of the interventricular septum. No pericardial effusion or signs of pericardial thickening. Aorta with mild dilation but without aneurysmal caliber. No acute aortic process with classic three-vessel branching pattern. Mediastinum/Nodes: Esophagus is patulous and thickened distally up to nearly a cm greatest thickness above the GE junction which is short segment and or focal. No thoracic inlet adenopathy. No axillary lymphadenopathy. No hilar or mediastinal lymphadenopathy. Lungs/Pleura: There is no pneumothorax. No pleural effusion. No lobar consolidation. Airways are patent. Musculoskeletal: No acute bone finding. No destructive bone process. Spinal degenerative changes. Review of the MIP images confirms the above findings. CT ABDOMEN and PELVIS FINDINGS Hepatobiliary: Signs of periportal edema. No focal, suspicious hepatic lesion. Motion limited assessment of structures in the upper abdomen. No signs of biliary duct dilation. Pericholecystic stranding without substantial gallbladder distension and with cholelithiasis. Pancreas: Normal, without mass, inflammation or ductal dilatation. Spleen: Spleen is normal in size and contour. Adrenals/Urinary Tract: Adrenal glands are normal. Striated  nephrogram bilaterally. No focal, suspicious renal lesion. Urinary bladder under distended otherwise unremarkable. Stomach/Bowel: Distal esophageal thickening. No acute gastric or small bowel process. Normal appendix. Colon is largely decompressed with signs of diverticular changes of the sigmoid. Vascular/Lymphatic: Aortic atherosclerosis. No sign of aneurysm. Smooth contour of the IVC. There is no gastrohepatic or hepatoduodenal ligament lymphadenopathy. No retroperitoneal or mesenteric lymphadenopathy. No pelvic sidewall lymphadenopathy. Reproductive: Unremarkable by CT. Other: No ascites. Generalized mild  stranding about the upper abdomen may be related to urinary tract infection. No pneumoperitoneum. Musculoskeletal: Severe LEFT hip degenerative changes. Degenerative changes of the spine. No acute or destructive bone process. Review of the MIP images confirms the above findings. IMPRESSION: 1. Positive for acute PE with CT evidence of right heart strain (RV/LV Ratio = between 1.28 and 1.54) consistent with at least submassive (intermediate risk) PE. The presence of right heart strain has been associated with an increased risk of morbidity and mortality. Please refer to the "Code PE Focused" order set in EPIC. 2. Cholelithiasis, pericholecystic stranding favored to be related to cardiogenic factors and or volume resuscitation. Could consider ultrasound as warranted for further assessment. No wall thickening or substantial distension. 3. CT findings of pyelonephritis most pronounced in the LEFT upper pole. 4. Esophagus is patulous and thickened distally up to nearly a cm greatest thickness above the GE junction which is short segment and or focal. Findings are suspicious for neoplasm, potentially related to severe esophagitis. 5. Severe LEFT hip degenerative changes. 6. Aortic atherosclerosis. Findings of pulmonary emboli and esophageal thickening were discussed with the provider caring for the patient as outlined below. Critical Value/emergent results were called by telephone at the time of interpretation on 01/30/2022 at 2:28 pm to provider Sherwood Gambler , who verbally acknowledged these results. Electronically Signed   By: Zetta Bills M.D.   On: 01/30/2022 14:29   MR BRAIN WO CONTRAST  Result Date: 01/31/2022 CLINICAL DATA:  Mental status change.  Follow-up brain tumor. EXAM: MRI HEAD WITHOUT CONTRAST TECHNIQUE: Multiplanar, multiecho pulse sequences of the brain and surrounding structures were obtained without intravenous contrast. COMPARISON:  Head CT yesterday. FINDINGS: Brain: The patient was disoriented  and would not hold still for scanning. Contrast enhanced imaging was not achieved. The only coronal imaging achieve was the diffusion study. Medication or anesthesia may be necessary. The brainstem and cerebellum are normal. Both cerebral hemispheres show chronic appearing small vessel ischemic changes of the white matter. There is an approximately 5.2 x 5.3 cm mass lesion in the Peri insular brain on the right, exact site of origin not determined, but temporal lobe is favored. I would say there is some possibility that this could even be an extra-axial mass lesion. The surrounding brain shows pronounced vasogenic edema. There is mass effect with right-to-left shift of 11 mm. No hydrocephalus/ventricular trapping. Vascular: Major vessels at the base of the brain show flow. Skull and upper cervical spine: Negative Sinuses/Orbits: Clear/normal Other: None IMPRESSION: Limited and abbreviated study because of the patient's condition and patient motion. The study confirms the presence of an approximately 5.2 x 5.3 cm mass on the right. I think this could be intra-axial, probably arising from the temporal insula, but I think there is some potential that it could be an extra-axial mass. No evidence of necrosis or pronounced heterogeneity. Mass effect with right-to-left shift 11 mm. Vasogenic edema within the right hemisphere. Chronic small-vessel ischemic changes of the cerebral hemispheric white matter. Electronically Signed   By: Jan Fireman.D.  On: 01/31/2022 15:51   CT ABDOMEN PELVIS W CONTRAST  Result Date: 01/30/2022 CLINICAL DATA:  Altered mental status and sepsis. * Tracking Code: BO * EXAM: CT ANGIOGRAPHY CHEST CT ABDOMEN AND PELVIS WITH CONTRAST TECHNIQUE: Multidetector CT imaging of the chest was performed using the standard protocol during bolus administration of intravenous contrast. Multiplanar CT image reconstructions and MIPs were obtained to evaluate the vascular anatomy. Multidetector CT imaging of  the abdomen and pelvis was performed using the standard protocol during bolus administration of intravenous contrast. RADIATION DOSE REDUCTION: This exam was performed according to the departmental dose-optimization program which includes automated exposure control, adjustment of the mA and/or kV according to patient size and/or use of iterative reconstruction technique. CONTRAST:  131m OMNIPAQUE IOHEXOL 350 MG/ML SOLN COMPARISON:  Abdomen evaluation of the same date. FINDINGS: CTA CHEST FINDINGS Cardiovascular: There is thrombus in the RIGHT lower lobe artery with extension into RIGHT middle lobe branches, at the segmental level. There is also thrombus in RIGHT lower lobe branches at the central segmental level. Near occlusive thrombus to posterior RIGHT lower lobe branches. Central segmental involvement of all upper lobe branches on the RIGHT as well. Similar pattern of thrombosis in the LEFT chest also with lower lobe level, lobar level embolus perhaps bridging into upper lobe branches and filling lower lobe branches as well, lobar and segmental level emboli throughout the LEFT chest. There are multiple segmental level thrombi in the LEFT lower lobe, lingular branch and upper lobe branches. RV to LV ratio is increased to between 1.24 and 1.54 depending on level of measurement. Marked straightening of the interventricular septum. No pericardial effusion or signs of pericardial thickening. Aorta with mild dilation but without aneurysmal caliber. No acute aortic process with classic three-vessel branching pattern. Mediastinum/Nodes: Esophagus is patulous and thickened distally up to nearly a cm greatest thickness above the GE junction which is short segment and or focal. No thoracic inlet adenopathy. No axillary lymphadenopathy. No hilar or mediastinal lymphadenopathy. Lungs/Pleura: There is no pneumothorax. No pleural effusion. No lobar consolidation. Airways are patent. Musculoskeletal: No acute bone finding. No  destructive bone process. Spinal degenerative changes. Review of the MIP images confirms the above findings. CT ABDOMEN and PELVIS FINDINGS Hepatobiliary: Signs of periportal edema. No focal, suspicious hepatic lesion. Motion limited assessment of structures in the upper abdomen. No signs of biliary duct dilation. Pericholecystic stranding without substantial gallbladder distension and with cholelithiasis. Pancreas: Normal, without mass, inflammation or ductal dilatation. Spleen: Spleen is normal in size and contour. Adrenals/Urinary Tract: Adrenal glands are normal. Striated nephrogram bilaterally. No focal, suspicious renal lesion. Urinary bladder under distended otherwise unremarkable. Stomach/Bowel: Distal esophageal thickening. No acute gastric or small bowel process. Normal appendix. Colon is largely decompressed with signs of diverticular changes of the sigmoid. Vascular/Lymphatic: Aortic atherosclerosis. No sign of aneurysm. Smooth contour of the IVC. There is no gastrohepatic or hepatoduodenal ligament lymphadenopathy. No retroperitoneal or mesenteric lymphadenopathy. No pelvic sidewall lymphadenopathy. Reproductive: Unremarkable by CT. Other: No ascites. Generalized mild stranding about the upper abdomen may be related to urinary tract infection. No pneumoperitoneum. Musculoskeletal: Severe LEFT hip degenerative changes. Degenerative changes of the spine. No acute or destructive bone process. Review of the MIP images confirms the above findings. IMPRESSION: 1. Positive for acute PE with CT evidence of right heart strain (RV/LV Ratio = between 1.28 and 1.54) consistent with at least submassive (intermediate risk) PE. The presence of right heart strain has been associated with an increased risk of morbidity and mortality. Please  refer to the "Code PE Focused" order set in EPIC. 2. Cholelithiasis, pericholecystic stranding favored to be related to cardiogenic factors and or volume resuscitation. Could  consider ultrasound as warranted for further assessment. No wall thickening or substantial distension. 3. CT findings of pyelonephritis most pronounced in the LEFT upper pole. 4. Esophagus is patulous and thickened distally up to nearly a cm greatest thickness above the GE junction which is short segment and or focal. Findings are suspicious for neoplasm, potentially related to severe esophagitis. 5. Severe LEFT hip degenerative changes. 6. Aortic atherosclerosis. Findings of pulmonary emboli and esophageal thickening were discussed with the provider caring for the patient as outlined below. Critical Value/emergent results were called by telephone at the time of interpretation on 01/30/2022 at 2:28 pm to provider Sherwood Gambler , who verbally acknowledged these results. Electronically Signed   By: Zetta Bills M.D.   On: 01/30/2022 14:29   ECHOCARDIOGRAM COMPLETE  Result Date: 01/31/2022    ECHOCARDIOGRAM REPORT   Patient Name:   Jennifer Pacheco [facility] Date of Exam: 01/31/2022 Medical Rec #:  [id number]              Height:       65.0 in Accession #:    [id number]             Weight:       153.2 lb Date of Birth:  September 28, 1955               BSA:          1.766 m Patient Age:    7 years               BP:           123/82 mmHg Patient Gender: F                      HR:           77 bpm. Exam Location:  Inpatient Procedure: 2D Echo and Color Doppler Indications:    I26.02 Pulmonary embolus  History:        Patient has no prior history of Echocardiogram examinations.                 Signs/Symptoms:Altered Mental Status. Brain mass. Shock.  Sonographer:    Roseanna Rainbow RDCS Referring Phys: [id number] Mick Sell  Sonographer Comments: Image acquisition challenging due to patient behavioral factors. and Image acquisition challenging due to uncooperative patient. Exam ended because patient grabbed me twice and could not stay in position. Patient pushed probe away and rolled into right decubitus position despite my direction.  IMPRESSIONS  1. Left ventricular ejection fraction, by estimation, is 60 to 65%. The left ventricle has normal function. Left ventricular endocardial border not optimally defined to evaluate regional wall motion. Left ventricular diastolic function could not be evaluated.  2. Right ventricular systolic function is normal. The right ventricular size is not well visualized.  3. The mitral valve is normal in structure. Mild mitral valve regurgitation. No evidence of mitral stenosis.  4. The aortic valve is tricuspid. Aortic valve regurgitation is not visualized. No aortic stenosis is present.  5. There is mild dilatation of the ascending aorta, measuring 39 mm. Conclusion(s)/Recommendation(s): Very limited study. Only parasternal long axis images obtained. Unable to reliably assess LV wall motion and confidently estimate RV function, but based on the data available, both ventricles appear to have normal contractility. Repeat study when patient is cooperative. FINDINGS  Left Ventricle: Left ventricular ejection fraction, by estimation, is 60 to 65%. The left ventricle has normal function. Left ventricular endocardial border not optimally defined to evaluate regional wall motion. The left ventricular internal cavity size was normal in size. There is no left ventricular hypertrophy. Left ventricular diastolic function could not be evaluated. Right Ventricle: The right ventricular size is not well visualized. Right vetricular wall thickness was not well visualized. Right ventricular systolic function is normal. Left Atrium: Left atrial size was normal in size. Right Atrium: Right atrial size was not well visualized. Pericardium: There is no evidence of pericardial effusion. Mitral Valve: The mitral valve is normal in structure. Mild mitral annular calcification. Mild mitral valve regurgitation. No evidence of mitral valve stenosis. Tricuspid Valve: The tricuspid valve is normal in structure. Tricuspid valve regurgitation  is not demonstrated. Aortic Valve: The aortic valve is tricuspid. Aortic valve regurgitation is not visualized. No aortic stenosis is present. Pulmonic Valve: The pulmonic valve was not well visualized. Aorta: The aortic root is normal in size and structure. There is mild dilatation of the ascending aorta, measuring 39 mm. IAS/Shunts: The interatrial septum was not assessed.  LEFT VENTRICLE PLAX 2D LVIDd:         3.90 cm LVIDs:         2.40 cm LV PW:         0.80 cm LV IVS:        0.70 cm LVOT diam:     2.20 cm LVOT Area:     3.80 cm  LEFT ATRIUM         Index LA diam:    2.70 cm 1.53 cm/m   AORTA Ao Root diam: 3.50 cm Ao Asc diam:  3.90 cm  SHUNTS Systemic Diam: 2.20 cm Dani Gobble Croitoru MD Electronically signed by Sanda Klein MD Signature Date/Time: 01/31/2022/9:41:28 AM    Final    DG HIP UNILAT WITH PELVIS 2-3 VIEWS LEFT  Result Date: 02/01/2022 CLINICAL DATA:  Acute left hip pain. EXAM: DG HIP (WITH OR WITHOUT PELVIS) 2-3V LEFT COMPARISON:  None Available. FINDINGS: No evidence of left hip fracture or dislocation. Severe left hip osteoarthritis is seen, with chronic collapse of the femoral head, geode formation, and chronic bony remodeling. IMPRESSION: No acute findings. End-stage left hip osteoarthritis. Electronically Signed   By: Marlaine Hind M.D.   On: 02/01/2022 09:21   DG Hip Unilat W or Wo Pelvis 2-3 Views Left  Result Date: 01/30/2022 CLINICAL DATA:  Altered mental status.  Fall. EXAM: DG HIP (WITH OR WITHOUT PELVIS) 2-3V LEFT COMPARISON:  None Available. FINDINGS: There is diffuse decreased bone mineralization. Severe left femoroacetabular osteoarthritis including superior bone-on-bone contact, extensive subchondral sclerosis and cystic change, and large peripheral osteophytes with moderate to high-grade superior femoral head and acetabular cortical flattening/bone loss/remodeling. Mild right femoroacetabular joint space narrowing and peripheral osteophytosis. The bilateral sacroiliac and pubic  symphysis joint spaces are maintained. No definite acute fracture is seen. No dislocation. Moderate to severe left L4-5 disc space narrowing and peripheral degenerative osteophytosis. IMPRESSION: Severe left femoroacetabular osteoarthritis. No definite acute fracture. Electronically Signed   By: Yvonne Kendall M.D.   On: 01/30/2022 14:06   US Abdomen Limited RUQ (LIVER/GB)  Result Date: 01/31/2022 CLINICAL DATA:  Cholelithiasis. EXAM: ULTRASOUND ABDOMEN LIMITED RIGHT UPPER QUADRANT COMPARISON:  CT scan of the abdomen and pelvis January 30, 2022 FINDINGS: Gallbladder: Cholelithiasis. Mild wall thickening measuring 3.5 mm. Mild pericholecystic fluid. No Murphy's sign. Common bile duct: Diameter: 5.4 mm Liver:  No focal lesion identified. Within normal limits in parenchymal echogenicity. Portal vein is patent on color Doppler imaging with normal direction of blood flow towards the liver. Other: None. IMPRESSION: 1. Cholelithiasis, mild pericholecystic fluid, and mild gallbladder wall thickening. No Murphy's sign. If the clinical picture remains ambiguous, a HIDA scan could further evaluate. 2. The common bile duct and liver are unremarkable. Electronically Signed   By: Dorise Bullion III M.D.   On: 01/31/2022 11:43    Labs:  CBC: Recent Labs    01/31/22 1025 02/01/22 0248 02/02/22 0514 02/02/22 1113  WBC 13.5* 16.6* 16.8* 25.3*  HGB 8.1* 8.2* 6.3* 8.9*  HCT 25.8* 26.2* 20.0* 27.0*  PLT 452* 471* 450* 308    COAGS: Recent Labs    01/30/22 1211  INR 1.3*    BMP: Recent Labs    01/30/22 2315 01/31/22 0630 02/01/22 0248 02/02/22 0514  NA 135 133* 133* 131*  K 3.7 4.3 3.7 3.7  CL 105 105 106 102  CO2 19* 20* 21* 22  GLUCOSE 137* 135* 180* 223*  BUN 26* '23 23 22  '$ CALCIUM 8.3* 8.3* 8.5* 7.9*  CREATININE 0.66 0.58 0.66 0.70  GFRNONAA >60 >60 >60 >60    LIVER FUNCTION TESTS: Recent Labs    01/30/22 1211 01/30/22 2315 02/02/22 0514  BILITOT 0.7 0.5 0.5  AST 69* 59* 21  ALT 35 37 28   ALKPHOS 60 52 35*  PROT 7.4 6.3* 4.9*  ALBUMIN 3.4* 2.8* 2.3*    Assessment and Plan:  submassive PE --hemodynamically significant acute PE  --ongoing upper GI bleed --AC stopped, needs IVC filter --Not capable of consenting self --Primary team endorses filter placement as emergent and necessary, and in the absence of mPOA, implied consent should be acted upon. --IR to proceed with IVC filter placement as requested.   Thank you for this interesting consult.  I greatly enjoyed meeting [facility] and look forward to participating in their care.  A copy of this report was sent to the requesting provider on this date.  Electronically Signed: Pasty Spillers, PA 02/02/2022, 12:15 PM   I spent a total of 55 Mi[place] in face to face in clinical consultation, greater than 50% of which was counseling/coordinating care for IVC filter placement

## 2022-02-02 NOTE — Progress Notes (Signed)
NAME:  Jennifer Pacheco, MRN:  [id number], DOB:  06-22-1956, LOS: 3 ADMISSION DATE:  01/30/2022, CONSULTATION DATE:  6/2 REFERRING MD:  Dr. Regenia Skeeter, CHIEF COMPLAINT:  PE; hypotension   History of Present Illness:  66 year old female with pertinent PMH of HTN, DMT2 presents to [facility] ED on 6/2 with AMS.  Patient was found down on floor at home altered covered in feces and urine and someone checked on her today on 6/2.  EMS transported patient to [facility].  Patient altered but states that she fell on her left buttock area and has been unable to to get up since.  Unsure exactly how long she has been down.  Patient noted to be hypoxic and hypotensive. Afebrile. Started on IV fluids for hypotension. Sats improved on [place]. CT chest showing submassive PE with RV/LV ratio 1.2-1.5. Started on heparin. Broad spectrum abx started. CT abd and UA concerning for UTI w/ possible pyelonephritis. CXR unremarkable. 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 NSG consulted recommend MRI and decadron. Recommend for transfer to [facility]. PCCM consulted for ICU admission.  Pertinent  Medical History  HTN DMT2  Significant Hospital Events: Including procedures, antibiotic start and stop dates in addition to other pertinent events   6/2 Admit to [facility] w/ hypotension and PE; transfer to [facility] 6/3 Tx to [facility], PCU.  On heparin, 1 unit PRBC after transfer to [facility].  On RA.  6/5 Concern for bleeding, Hgb drop from 8.2 to 6.3, PRBC ordered, tx back to ICU. Heparin stopped.   Interim History / Subjective:  Afebrile  RN reports patient declined overnight through her shift, pale, nauseated.  Hgb dropped with concern for bleeding but no emesis. Patient reports nausea / feeling as if she will vomit.  Hgb drop from 8.2 to 6.3 2L [place]  Glucose range 133-223  I/O 1.4L UOP, +71m in last 24 hours  Objective    Blood pressure 127/80, pulse 82, temperature (!) 97 F (36.1 C), resp. rate (!) 24, height '5\' 5"'$  (1.651 m), weight 69.5 kg, SpO2 97 %.        Intake/Output Summary (Last 24 hours) at 02/02/2022 0740 Last data filed at 02/02/2022 02595Gross per 24 hour  Intake 1446.88 ml  Output 1400 ml  Net 46.88 ml   Filed Weights   01/30/22 2053 01/31/22 0355 01/31/22 1100  Weight: 69.5 kg 69.5 kg 69.5 kg    Examination: General: pleasant, disheveled adult female sitting up in bed in NAD  HEENT: MM pale/moist, anicteric, tangled hair Neuro: Awake, alert, oriented to self, states she would want her boyfriend (Yvone Neu to make decisions for her if she could not, MAE/generalized weakness CV: s1s2 RRR, SR 80's on monitor, no m/r/g PULM:  non-labored at rest, lungs bilaterally clear GI: soft, bsx4 active  Extremities: warm/dry, LLE slightly larger than RLE, trace edema  Skin: no rashes or lesions, pale skin. PIV's in place.   Resolved Hospital Problem list   Elevated CK   Assessment & Plan:   Submassive Pulmonary Embolus  Shock RV/LV ratio 1.28-1.54.  ECHO with LVEF 60-65%, RVSP normal but note limited study.  Shock likely combination of obstructive w/ septic shock, acute blood loss anemia. 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 Ethanol, acetaminophen, salicylate, UDS all normal.  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 -appreciate NSGY evaluation  -continue decadron IV  -await MRI brain findings, ordered but not completed yet   Acute Respiratory Failure with Hypoxia due to Pulmonary Embolism -O2 as needed to support sats >90% -pulmonary hygiene - IS, mobilize as able   Hypokalemia  -trend BMP, Mg+ -replace electrolytes as indicated, 2gm Mg+ 6/5  Anemia / ABLA  Likely additional chronic element with suspected undiagnosed malignancy  -trend CBC -transfuse 2 units now  -follow up labs post transfusion   Nausea  -PRN zofran   Hyperglycemia DMT2 Hgb A1c 5.5  -SSI, sensitive scale   Cholelithiasis RUQ [place] with mild pericholecystic fluid, mild gallbladder wall thickening, CBD/liver unremarkable -follow clinical exam   Distal Esophageal Thickening Concern for neoplasm vs. Esophagitis. Concern for possible bleeding am 6/5.  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 She indicated his number was in her phone - number is [phone].  Attempted to call him with no answer, message left for return call.   Critical care time: 79 minutes     Noe Gens, MSN, APRN, NP-C, AGACNP-BC [facility] 02/02/2022, 7:40 AM   Please see [email or web address] for pager details.   From 7A-7P if no response, please call [phone] After hours, please call ELink  [phone]

## 2022-02-02 NOTE — Plan of Care (Addendum)
Please note patient transferred to ICU service morning of 02/02/2022.  She was transferred out of ICU yesterday to my service with a diagnosis of hemodynamically significant large PE requiring heparin drip, new brain mass with encephalopathy, CT scan suspicious for duodenal mass, GI was consulted and she was placed on IV PPI.  He was hemodynamically stable on 02/01/2022 unfortunately overnight on 02/02/2022 she started developing nausea with hypotension, skin was pale H&H has dropped, likely having upper GI bleed from the esophageal site in the presence of heparin drip, she is getting 2 units of packed RBC transfused, heparin drip has been held, on IV PPI already.  Have requested IR to place IVC filter in the setting of acute PE and now GI bleed.  Case discussed with ICU team.  If stable she can come out of ICU to hospitalist service.  Also message left for patient's boyfriend Yvone Neu over his cell phone, number was provided by patient which was present in her cell phone, may need CT to R/O RP bleed as well.   Of note patient's IVC filter placement is a emergent procedure in the setting of hemodynamically significant acute PE and now ongoing bleed upper GI bleed vs other requiring urgent transfusion.

## 2022-02-02 NOTE — Progress Notes (Signed)
SLP Cancellation Note  Patient Details Name: Jennifer Pacheco MRN: [id number] DOB: [birth year]   Cancelled treatment:       Reason Eval/Treat Not Completed: Medical issues which prohibited therapy. Pt made NPO for medical reasons. Chart reviewed and will continue to follow as able.    Osie Bond., M.A. [place] Office [phone]  Secure chat preferred  02/02/2022, 1:44 PM

## 2022-02-02 NOTE — Progress Notes (Signed)
Inpatient Diabetes Program Recommendations  AACE/ADA: New Consensus Statement on Inpatient Glycemic Control (2015)  Target Ranges:  Prepandial:   less than 140 mg/dL      Peak postprandial:   less than 180 mg/dL (1-2 hours)      Critically ill patients:  140 - 180 mg/dL   Lab Results  Component Value Date   GLUCAP 206 (H) 02/02/2022   HGBA1C 5.5 01/30/2022    Review of Glycemic Control  Latest Reference Range & Units 02/01/22 07:31 02/01/22 11:15 02/01/22 15:27 02/01/22 19:27 02/01/22 23:35 02/02/22 03:42 02/02/22 08:04  Glucose-Capillary 70 - 99 mg/dL 134 (H) 173 (H) 167 (H) 188 (H) 133 (H) 220 (H) 206 (H)   Diabetes history: DM 2 Outpatient Diabetes medications: Metformin in the past Current orders for Inpatient glycemic control: None  Inpatient Diabetes Program Recommendations:    Sliding scale d/c'd glucose trends increasing into the 200 range.  Pt may benefit from adding back Novolog Correction scale while on Decadron 10 mg Q6 hours. MD to assess during rounds.  Thanks,  Tama Headings RN, MSN, BC-ADM Inpatient Diabetes Coordinator Team Pager [phone] (8a-5p)

## 2022-02-02 NOTE — Consult Note (Signed)
Consultation Note Date: 02/02/2022   Patient Name: Jennifer Pacheco  DOB: 22-Nov-1955  MRN: [id number]  Age / Sex: 66 y.o., female  PCP: [name], NP Referring Physician: Kipp Brood, MD  Reason for Consultation: Establishing goals of care and Psychosocial/spiritual support  HPI/Patient Profile: 66 y.o. female   admitted on 01/30/2022 with  PMH of HTN, DMT2 presents to [facility] ED on 6/2 with AMS.  Patient was found down on floor at home altered covered in feces and urine and someone checked on her today on 6/2.  EMS transported patient to [facility].   Unsure exactly how long she has been down.  Patient noted to be hypoxic and hypotensive. Afebrile. Started on IV fluids for hypotension. Sats improved on [place].   CT chest showing submassive PE with RV/LV ratio 1.2-1.5. Started on heparin. Broad spectrum abx started. CT abd and UA concerning for UTI w/ possible pyelonephritis. CXR unremarkable.   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 NSG consulted recommend MRI and decadron.   Recommend for transfer to [facility]. PCCM consulted for ICU admission.  No contacts listed unknown next of kin.   Important healthcare decisions pending.  Clearly this is a complicated psychosocial situation which will need further exploration.      Clinical Assessment and Goals of Care:  This NP Wadie Lessen detail review and exploration of medical records, received report from team, assessed the patient.  Patient is lethargic and unable to participate in conversation at this time.  Discussion had with bedside RN regarding current medical situation.  Nursing secretary was able to help me get contact with Mr. Antionette Fairy who called the unit checking on the patient.  I spoke to him by phone.  He  tells me he is a friend of the patient, not significant other or main decision maker.  He agrees to meet with me in person tomorrow for conversation regarding overall medical situation    Questions and concerns addressed.     PMT will continue to support holistically.     Patient has no contacts listed in EMR.  We are unaware of any family or next of kin.  However a man by the name of Melvenia Needles   [phone] called the nurses station inquiring about this patient.    SUMMARY OF RECOMMENDATIONS    Code Status/Advance Care Planning: Full code   Palliative Prophylaxis:  Aspiration, Delirium Protocol, Frequent Pain Assessment, and Oral Care  Additional Recommendations (Limitations, Scope, Preferences): Full Scope Treatment  Psycho-social/Spiritual:  Desire for further Chaplaincy support:no   Prognosis:  Unable to determine  Discharge Planning: To Be Determined      Primary Diagnoses: Present on Admission:  Pulmonary embolism ([place])   I have reviewed the medical record, interviewed the patient and family, and examined the patient. The following aspects are pertinent.  Past Medical History:  Diagnosis Date   Hard of hearing  Social History   Socioeconomic History   Marital status: Single    Spouse name: Not on file   Number of children: Not on file   Years of education: Not on file   Highest education level: Not on file  Occupational History   Not on file  Tobacco Use   Smoking status: Never   Smokeless tobacco: Never  Substance and Sexual Activity   Alcohol use: Not Currently   Drug use: Yes    Types: Marijuana   Sexual activity: Yes    Partners: Male  Other Topics Concern   Not on file  Social History Narrative   Not on file   Social Determinants of Health   Financial Resource Strain: Not on file  Food Insecurity: Not on file  Transportation Needs: Not on file  Physical Activity: Not on file  Stress: Not on file  Social Connections:  Not on file   Family History  Problem Relation Age of Onset   Breast cancer Maternal Aunt    Scheduled Meds:  sodium chloride   Intravenous Once   Chlorhexidine Gluconate Cloth  6 each Topical Q0600   dexamethasone (DECADRON) injection  10 mg Intravenous Q6H   levofloxacin  500 mg Oral Daily   mouth rinse  15 mL Mouth Rinse BID   pantoprazole (PROTONIX) IV  40 mg Intravenous Q12H   Continuous Infusions:  sodium chloride Stopped (02/02/22 0929)   sodium chloride Stopped (02/02/22 1044)   heparin Stopped (02/02/22 0732)   norepinephrine (LEVOPHED) Adult infusion 3 mcg/min (02/02/22 1325)   PRN Meds:.acetaminophen, calcium carbonate, docusate sodium, haloperidol lactate, polyethylene glycol Medications Prior to Admission:  Prior to Admission medications   Medication Sig Start Date End Date Taking? Authorizing Provider  lisinopril (ZESTRIL) 20 MG tablet Take 20 mg by mouth daily. 04/15/20  Yes [provider]  meloxicam (MOBIC) 15 MG tablet Take 15 mg by mouth daily as needed for pain.   Yes [provider]  cyclobenzaprine (FLEXERIL) 10 MG tablet Take by mouth. Patient not taking: Reported on 01/30/2022    [provider]  metFORMIN (GLUCOPHAGE) 500 MG tablet Take by mouth. Patient not taking: Reported on 01/30/2022    [provider]  Omega-3 1000 MG CAPS Take by mouth. Patient not taking: Reported on 01/30/2022    [provider]   Allergies  Allergen Reactions   Penicillins Anaphylaxis   Review of Systems  Unable to perform ROS: Mental status change   Physical Exam Constitutional:      Appearance: She is normal weight. She is ill-appearing.  Cardiovascular:     Rate and Rhythm: Normal rate.  Pulmonary:     Effort: Pulmonary effort is normal.  Skin:    General: Skin is warm and dry.  Neurological:     Mental Status: She is lethargic.    Vital Signs: BP (!) 73/60   Pulse (!) 104   Temp (!) 96.1 F (35.6 C) (Axillary) Comment:  informed Comptroller, bear hugger applied  Resp (!) 27   Ht '5\' 5"'$  (1.651 m)   Wt 69.5 kg   SpO2 98%   BMI 25.50 kg/m  Pain Scale: 0-10   Pain Score: Asleep   SpO2: SpO2: 98 % O2 Device:SpO2: 98 % O2 Flow Rate: .O2 Flow Rate (L/min): 2 L/min  IO: Intake/output summary:  Intake/Output Summary (Last 24 hours) at 02/02/2022 1328 Last data filed at 02/02/2022 1100 Gross per 24 hour  Intake 2715.52 ml  Output 1400 ml  Net 1315.52 ml    LBM: Last BM Date : 01/30/22 Baseline Weight: Weight: (!) 165 kg Most recent weight: Weight: 69.5 kg     Palliative Assessment/Data:     Discussed with treatment team  Signed by: Wadie Lessen, NP   Please contact Palliative Medicine Team phone at [phone] for questions and concerns.  For individual provider: See Shea Evans

## 2022-02-02 NOTE — Procedures (Signed)
  Procedure:  IVCgram, IVC filter placement [place] retrievable Preprocedure diagnosis: The primary encounter diagnosis was Shock ([place]). Diagnoses of Fall, initial encounter, Acute pulmonary embolism with acute cor pulmonale, unspecified pulmonary embolism type ([place]), Brain edema ([place]), and Acute respiratory failure with hypoxia ([place]) were also pertinent to this visit.  Postprocedure diagnosis: same EBL:    minimal Complications:   none immediate  See full dictation in [employer].  Dillard Cannon MD Main # [phone] Pager  [phone] Mobile [phone]

## 2022-02-02 NOTE — Progress Notes (Signed)
Notified MD of patient's low blood pressure despite being on levo at 10 mcg/hr. MD ordered a 1000 mL bolus followed by infusing maintenance fluids at 100 mL/hr. MD also ordered a STAT CBC.   This RN will monitor BP closely, rechecking q15 until the end of shift.   Normajean Baxter, RN

## 2022-02-02 NOTE — Progress Notes (Signed)
Patient ID: Jennifer Pacheco, female   DOB: Jan 11, 1956, 66 y.o.   MRN: [id number] Patient overall neurologically unchanged still confused little more so today left hemiparesis  However hemodynamically very unstable blood pressure low currently on Levophed being titrated.  PEs and blood pressure and cardiac management are primary at this point when patient hemodynamically stable then we can continue work-up with MRI MRIs not emergent right now.

## 2022-02-02 NOTE — Progress Notes (Signed)
0635 called Dr.Kakrakandy due to pt bp dropping to 83'U systolic. NS bolus started   0640 called Critical hgb of 6.3. new orders for blood products   0645 spoke with RRT nurse to update about bp and hgb

## 2022-02-02 NOTE — Progress Notes (Signed)
[facility] P[facility]M PROGRESS NOTE  S: Called to bedside by EMD for worsening shock. On 10 mcg peripheral levophed now with MAP 82, but narrow pulse pressure 94/75. Briefly this is a 66 year old female with newly diagnosed GBM complicated by PE, UTI, shock, and acute anemia. Heparin stopped and IVC filter placed earlier today based on HGB 6.2 this morning. Improved to 8.9 with 2 units PRBC. Clinically improved as well. Then continued to wane throughout the day. Now hemoglobin back down to 6.9 in less than 12 hours. No evidence of GIB per RN.    O: BP (!) 86/68   Pulse (!) 127   Temp 97.6 F (36.4 C) (Oral)   Resp (!) 30   Ht '5\' 5"'$  (1.651 m)   Wt 69.5 kg   SpO2 94%   BMI 25.50 kg/m   General:  Frail 66 year old female Neuro:  Alert, oriented, hard of hearing. Combative at times with staff.  HEENT:  [place]/AT, PERRL, no JVD Cardiovascular: Tachy, regular, no MRG Lungs:  Clear, no distress.  Abdomen:  Soft, non-distended. Generalized tenderness on deep palpation Musculoskeletal:  Left sided weakness worsening per RN Skin:  Intact, MMM, global pallor.   POCUS poor cardiac windows. No significant pericardial effusion seen. LV function seems to be vigorous, but again, difficult windows.   Fluid collection in the L paracolic gutter concerning for blood given other clinical information.   CBC    Component Value Date/Time   WBC 24.2 (H) 02/02/2022 1943   RBC 2.72 (L) 02/02/2022 1943   HGB 7.1 (L) 02/02/2022 2010   HCT 21.0 (L) 02/02/2022 2010   PLT 241 02/02/2022 1943   MCV 76.5 (L) 02/02/2022 1943   MCH 25.4 (L) 02/02/2022 1943   MCHC 33.2 02/02/2022 1943   RDW 23.6 (H) 02/02/2022 1943   LYMPHSABS 1.2 02/02/2022 0514   MONOABS 1.2 (H) 02/02/2022 0514   EOSABS 0.0 02/02/2022 0514   BASOSABS 0.0 02/02/2022 0514    ABG    Component Value Date/Time   PHART 7.376 02/02/2022 2010   PCO2ART 25.9 (L) 02/02/2022 2010   PO2ART 74 (L) 02/02/2022 2010   HCO3 15.0 (L) 02/02/2022 2010   TCO2 16 (L)  02/02/2022 2010   ACIDBASEDEF 9.0 (H) 02/02/2022 2010   O2SAT 94 02/02/2022 2010     BMP, lactic acid pending   A/P:  Shock: there is some concern for obstructive shock, however, I believe she is hypovolemic currently. On 10 mcg peripheral levophed. Fluid collection in the L paracolic gutter and lateral (right) to her bladder. Im worried she has RP blood collection from heparin. Of note IVC filter placed today seems to be right IJ approach.  - Getting 1 unit PRBC now. Will add an additional unit. Target hemoglobin of 8 in the setting of active bleeding. If she needs additional blood beyond these units we will add FFP and platelets as well.  - Will get CT scan of the abd0omen/pelvis to confirm. Awaiting BMP so we can hopefully give IV contrast. - She was a bit combative with the echo probe, so I will hold off on CVL at this time. Hopefully pressor requirements will improve with blood products as they did earlier today.   L sided weakness worsening. Known right sided mass with midline shift brain compression.  Will add CT head while she is in the scanner for the abdomen.   Nausea - zofran  Metabolic acidosis - check lactic acid - BMP pending for further evaluation.  Patient reports Yvone Neu her boyfriend should receive updates regarding her care and she elects him to make medical decisions for her in the event she is unable to. His information has been added to the EMR and I have left a message for him to call us.   She also has a brother Ronalee Belts who lives locally. She does not have contact information for him.   Critical care time 45 minutes   Georgann Housekeeper, ACNP [facility] Pulmonology/Critical Care Pager [phone] or [phone]

## 2022-02-02 NOTE — Progress Notes (Addendum)
[facility] Progress Note Patient Name: Jennifer Pacheco DOB: [birth year] MRN: [id number]   Date of Service  02/02/2022  HPI/Events of Note  RN notified us that patient is not able to take her Levaquin tablet due to intermittent lethargy. Seen on camera. Patient awake, intermittently follows commands, moving extremities but also lethargic at times. RN had talked to Neurosurgery who did not think this was a direct Neuro issue and did not need repeat imaging. However, I also see she was more hypotensive this evening and her levophed needs have gone up. She only has peripheral iV lines and is due to get oral Levofloxacin. She was given fluid boluses and her levophed is now at 10 mic/min via peripheral line though MAP is 77. Was NPO all day so not sure if some component of being dehydrated also is causing this.   eICU Interventions  BMP, ABG and lactate now Switch Levaquin to IV Will consider adding Vancomycin if BP drops again If needing more pressors, will need a central line      Intervention Category Major Interventions: Sepsis - evaluation and management  Maicie Vanderloop G Glenna Brunkow 02/02/2022, 8:01 PM  Addendum at 8:30 pm Hemoglobin is 6.9  No bleeding endorsed MAP is 86, HR 120 on Levpohed Will give 1 unit RBC which should help with lowering pressor needs as well , RN to verify consent Check post transfusion H/H - if this does not respond then will pursue abdominal imaging  Holding off on IV vanc for now - MRSA nares was negative and otherwise not with fevers and suspect part of hypotension is from volume depletion, NPO, not eating. BMP is pending Have also requested ground team evaluate her  Addendum at 6:10 am Notified of CBC after 2 units RBC  Has been off pressors last few hours On IV bicarb per bedside team Improved bicarb and renal function this AM though has oliguria  Has been given a lot of fluids and RBC transfusion  CBC every 6 hours ordered to start at 7 am Recheck a  BMP at 11 am as well

## 2022-02-02 NOTE — Progress Notes (Signed)
An USGPIV (ultrasound guided PIV) has been placed for short-term vasopressor infusion. A correctly placed ivWatch must be used when administering Vasopressors. Should this treatment be needed beyond 72 hours, central line access should be obtained.  It will be the responsibility of the bedside nurse to follow best practice to prevent extravasations.  HS Nawal Burling RN 

## 2022-02-03 DIAGNOSIS — I959 Hypotension, unspecified: Secondary | ICD-10-CM | POA: Diagnosis not present

## 2022-02-03 DIAGNOSIS — I2699 Other pulmonary embolism without acute cor pulmonale: Secondary | ICD-10-CM | POA: Diagnosis not present

## 2022-02-03 DIAGNOSIS — K661 Hemoperitoneum: Secondary | ICD-10-CM | POA: Diagnosis not present

## 2022-02-03 DIAGNOSIS — R1310 Dysphagia, unspecified: Secondary | ICD-10-CM | POA: Diagnosis not present

## 2022-02-03 DIAGNOSIS — R933 Abnormal findings on diagnostic imaging of other parts of digestive tract: Secondary | ICD-10-CM | POA: Diagnosis not present

## 2022-02-03 DIAGNOSIS — Z515 Encounter for palliative care: Secondary | ICD-10-CM | POA: Diagnosis not present

## 2022-02-03 DIAGNOSIS — G9389 Other specified disorders of brain: Secondary | ICD-10-CM | POA: Diagnosis not present

## 2022-02-03 DIAGNOSIS — R404 Transient alteration of awareness: Secondary | ICD-10-CM

## 2022-02-03 DIAGNOSIS — J9601 Acute respiratory failure with hypoxia: Secondary | ICD-10-CM | POA: Diagnosis not present

## 2022-02-03 DIAGNOSIS — K219 Gastro-esophageal reflux disease without esophagitis: Secondary | ICD-10-CM | POA: Diagnosis not present

## 2022-02-03 LAB — CBC WITH DIFFERENTIAL/PLATELET
Abs Immature Granulocytes: 0.7 K/uL — ABNORMAL HIGH (ref 0.00–0.07)
Abs Immature Granulocytes: 0.86 10*3/uL — ABNORMAL HIGH (ref 0.00–0.07)
Abs Immature Granulocytes: 1.09 10*3/uL — ABNORMAL HIGH (ref 0.00–0.07)
Abs Immature Granulocytes: 1.05 10*3/uL — ABNORMAL HIGH (ref 0.00–0.07)
Basophils Absolute: 0.1 K/uL (ref 0.0–0.1)
Basophils Absolute: 0.1 10*3/uL (ref 0.0–0.1)
Basophils Absolute: 0.1 10*3/uL (ref 0.0–0.1)
Basophils Absolute: 0.1 10*3/uL (ref 0.0–0.1)
Basophils Relative: 0 %
Basophils Relative: 0 %
Basophils Relative: 0 %
Basophils Relative: 0 %
Eosinophils Absolute: 0 K/uL (ref 0.0–0.5)
Eosinophils Absolute: 0 10*3/uL (ref 0.0–0.5)
Eosinophils Absolute: 0 10*3/uL (ref 0.0–0.5)
Eosinophils Absolute: 0 10*3/uL (ref 0.0–0.5)
Eosinophils Relative: 0 %
Eosinophils Relative: 0 %
Eosinophils Relative: 0 %
Eosinophils Relative: 0 %
HCT: 31.9 % — ABNORMAL LOW (ref 36.0–46.0)
HCT: 33.5 % — ABNORMAL LOW (ref 36.0–46.0)
HCT: 32.8 % — ABNORMAL LOW (ref 36.0–46.0)
HCT: 32.4 % — ABNORMAL LOW (ref 36.0–46.0)
Hemoglobin: 11.6 g/dL — ABNORMAL LOW (ref 12.0–15.0)
Hemoglobin: 11.9 g/dL — ABNORMAL LOW (ref 12.0–15.0)
Hemoglobin: 11.5 g/dL — ABNORMAL LOW (ref 12.0–15.0)
Hemoglobin: 11.2 g/dL — ABNORMAL LOW (ref 12.0–15.0)
Immature Granulocytes: 3 %
Immature Granulocytes: 3 %
Immature Granulocytes: 3 %
Lymphocytes Relative: 5 %
Lymphocytes Relative: 5 %
Lymphocytes Relative: 5 %
Lymphocytes Relative: 5 %
Lymphs Abs: 1.3 K/uL (ref 0.7–4.0)
Lymphs Abs: 1.3 10*3/uL (ref 0.7–4.0)
Lymphs Abs: 1.7 10*3/uL (ref 0.7–4.0)
Lymphs Abs: 1.8 10*3/uL (ref 0.7–4.0)
MCH: 28.2 pg (ref 26.0–34.0)
MCH: 27.5 pg (ref 26.0–34.0)
MCH: 27.5 pg (ref 26.0–34.0)
MCH: 26.8 pg (ref 26.0–34.0)
MCHC: 36.4 g/dL — ABNORMAL HIGH (ref 30.0–36.0)
MCHC: 35.5 g/dL (ref 30.0–36.0)
MCHC: 35.1 g/dL (ref 30.0–36.0)
MCHC: 34.6 g/dL (ref 30.0–36.0)
MCV: 77.4 fL — ABNORMAL LOW (ref 80.0–100.0)
MCV: 77.4 fL — ABNORMAL LOW (ref 80.0–100.0)
MCV: 78.5 fL — ABNORMAL LOW (ref 80.0–100.0)
MCV: 77.5 fL — ABNORMAL LOW (ref 80.0–100.0)
Monocytes Absolute: 2.1 K/uL — ABNORMAL HIGH (ref 0.1–1.0)
Monocytes Absolute: 2.2 10*3/uL — ABNORMAL HIGH (ref 0.1–1.0)
Monocytes Absolute: 2.1 10*3/uL — ABNORMAL HIGH (ref 0.1–1.0)
Monocytes Absolute: 2.5 10*3/uL — ABNORMAL HIGH (ref 0.1–1.0)
Monocytes Relative: 8 %
Monocytes Relative: 8 %
Monocytes Relative: 6 %
Monocytes Relative: 7 %
Neutro Abs: 21.3 K/uL — ABNORMAL HIGH (ref 1.7–7.7)
Neutro Abs: 22.9 10*3/uL — ABNORMAL HIGH (ref 1.7–7.7)
Neutro Abs: 28.4 10*3/uL — ABNORMAL HIGH (ref 1.7–7.7)
Neutro Abs: 28.9 10*3/uL — ABNORMAL HIGH (ref 1.7–7.7)
Neutrophils Relative %: 84 %
Neutrophils Relative %: 84 %
Neutrophils Relative %: 86 %
Neutrophils Relative %: 84 %
Platelets: 166 K/uL (ref 150–400)
Platelets: 175 10*3/uL (ref 150–400)
Platelets: 155 10*3/uL (ref 150–400)
Platelets: 159 10*3/uL (ref 150–400)
RBC: 4.12 MIL/uL (ref 3.87–5.11)
RBC: 4.33 MIL/uL (ref 3.87–5.11)
RBC: 4.18 MIL/uL (ref 3.87–5.11)
RBC: 4.18 MIL/uL (ref 3.87–5.11)
RDW: 19.5 % — ABNORMAL HIGH (ref 11.5–15.5)
RDW: 19.6 % — ABNORMAL HIGH (ref 11.5–15.5)
RDW: 20 % — ABNORMAL HIGH (ref 11.5–15.5)
RDW: 20.4 % — ABNORMAL HIGH (ref 11.5–15.5)
WBC: 25.4 K/uL — ABNORMAL HIGH (ref 4.0–10.5)
WBC: 27.4 10*3/uL — ABNORMAL HIGH (ref 4.0–10.5)
WBC: 33.5 10*3/uL — ABNORMAL HIGH (ref 4.0–10.5)
WBC: 32.8 10*3/uL — ABNORMAL HIGH (ref 4.0–10.5)
nRBC: 8.3 % — ABNORMAL HIGH (ref 0.0–0.2)
nRBC: 10.5 % — ABNORMAL HIGH (ref 0.0–0.2)
nRBC: 8.9 % — ABNORMAL HIGH (ref 0.0–0.2)
nRBC: 7.6 % — ABNORMAL HIGH (ref 0.0–0.2)

## 2022-02-03 LAB — COMPREHENSIVE METABOLIC PANEL WITH GFR
ALT: 24 U/L (ref 0–44)
AST: 16 U/L (ref 15–41)
Albumin: 2.1 g/dL — ABNORMAL LOW (ref 3.5–5.0)
Alkaline Phosphatase: 35 U/L — ABNORMAL LOW (ref 38–126)
Anion gap: 3 — ABNORMAL LOW (ref 5–15)
BUN: 31 mg/dL — ABNORMAL HIGH (ref 8–23)
CO2: 19 mmol/L — ABNORMAL LOW (ref 22–32)
Calcium: 7.4 mg/dL — ABNORMAL LOW (ref 8.9–10.3)
Chloride: 110 mmol/L (ref 98–111)
Creatinine, Ser: 1.12 mg/dL — ABNORMAL HIGH (ref 0.44–1.00)
GFR, Estimated: 55 mL/min — ABNORMAL LOW
Glucose, Bld: 149 mg/dL — ABNORMAL HIGH (ref 70–99)
Potassium: 4.1 mmol/L (ref 3.5–5.1)
Sodium: 132 mmol/L — ABNORMAL LOW (ref 135–145)
Total Bilirubin: 1 mg/dL (ref 0.3–1.2)
Total Protein: 4.5 g/dL — ABNORMAL LOW (ref 6.5–8.1)

## 2022-02-03 LAB — BLOOD GAS, VENOUS
Acid-Base Excess: 5.5 mmol/L — ABNORMAL HIGH (ref 0.0–2.0)
Bicarbonate: 29.9 mmol/L — ABNORMAL HIGH (ref 20.0–28.0)
Drawn by: 6125
O2 Saturation: 29.1 %
Patient temperature: 37
pCO2, Ven: 42 mmHg — ABNORMAL LOW (ref 44–60)
pH, Ven: 7.46 — ABNORMAL HIGH (ref 7.25–7.43)
pO2, Ven: <31 mmHg — CL (ref 32–45)

## 2022-02-03 LAB — BASIC METABOLIC PANEL
Anion gap: 8 (ref 5–15)
BUN: 35 mg/dL — ABNORMAL HIGH (ref 8–23)
CO2: 20 mmol/L — ABNORMAL LOW (ref 22–32)
Calcium: 7.7 mg/dL — ABNORMAL LOW (ref 8.9–10.3)
Chloride: 103 mmol/L (ref 98–111)
Creatinine, Ser: 1.07 mg/dL — ABNORMAL HIGH (ref 0.44–1.00)
GFR, Estimated: 58 mL/min — ABNORMAL LOW (ref >60)
Glucose, Bld: 131 mg/dL — ABNORMAL HIGH (ref 70–99)
Potassium: 4.1 mmol/L (ref 3.5–5.1)
Sodium: 131 mmol/L — ABNORMAL LOW (ref 135–145)

## 2022-02-03 LAB — TYPE AND SCREEN
ABO/RH(D): A POS
ABO/RH(D): A POS
Antibody Screen: NEGATIVE
Antibody Screen: NEGATIVE
Unit division: 0
Unit division: 0
Unit division: 0
Unit division: 0
Unit division: 0
Unit division: 0
Unit division: 0

## 2022-02-03 LAB — BPAM RBC
Blood Product Expiration Date: 202306082359
Blood Product Expiration Date: 202306102359
Blood Product Expiration Date: 202306262359
Blood Product Expiration Date: 202306262359
Blood Product Expiration Date: 202306262359
Blood Product Expiration Date: 202306112359
Blood Product Expiration Date: 202306262359
ISSUE DATE / TIME: 202306030043
ISSUE DATE / TIME: 202306050655
ISSUE DATE / TIME: 202306050812
ISSUE DATE / TIME: 202306052046
ISSUE DATE / TIME: 202306052218
Unit Type and Rh: 6200
Unit Type and Rh: 6200
Unit Type and Rh: 6200
Unit Type and Rh: 6200
Unit Type and Rh: 6200
Unit Type and Rh: 6200
Unit Type and Rh: 6200

## 2022-02-03 LAB — GLUCOSE, CAPILLARY
Glucose-Capillary: 149 mg/dL — ABNORMAL HIGH (ref 70–99)
Glucose-Capillary: 164 mg/dL — ABNORMAL HIGH (ref 70–99)
Glucose-Capillary: 132 mg/dL — ABNORMAL HIGH (ref 70–99)
Glucose-Capillary: 128 mg/dL — ABNORMAL HIGH (ref 70–99)
Glucose-Capillary: 195 mg/dL — ABNORMAL HIGH (ref 70–99)
Glucose-Capillary: 154 mg/dL — ABNORMAL HIGH (ref 70–99)

## 2022-02-03 LAB — MAGNESIUM: Magnesium: 2.2 mg/dL (ref 1.7–2.4)

## 2022-02-03 LAB — BRAIN NATRIURETIC PEPTIDE: B Natriuretic Peptide: 146.4 pg/mL — ABNORMAL HIGH (ref 0.0–100.0)

## 2022-02-03 LAB — LACTIC ACID, PLASMA: Lactic Acid, Venous: 1.7 mmol/L (ref 0.5–1.9)

## 2022-02-03 IMAGING — CT CT HEAD W/O CM
3 of 6 series · 15 of 47 positions shown, 18 images · non-contrast
Comparison: MRI [DATE], CT brain [DATE]

CLINICAL DATA: Follow-up head mass worsening left-sided weakness



[Series 4: head 5.0 h30s · axial · 0.39mm/px · z∈[-108,+27]mm · 10 of 33 slices shown, 13 images]
[im 3/33  brain]
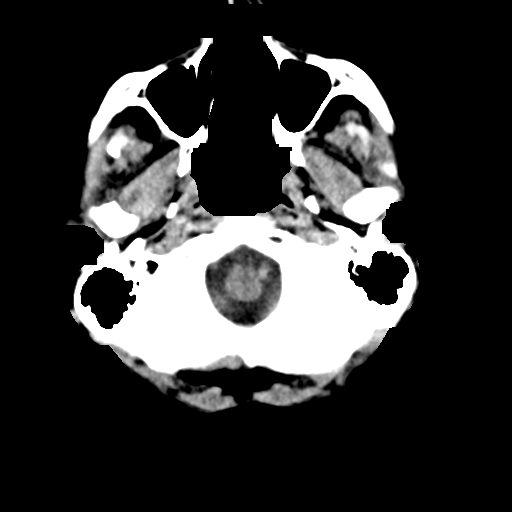
[im 3/33  bone]
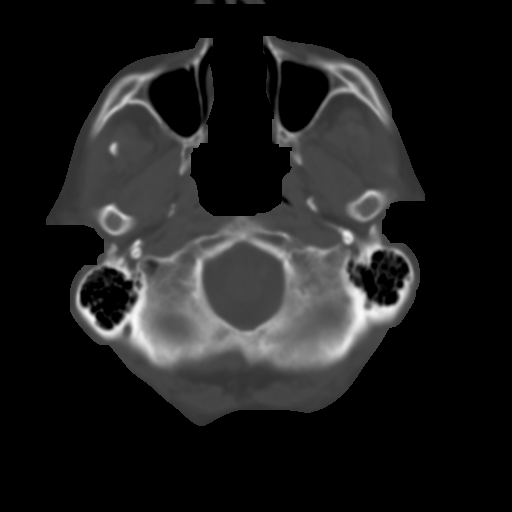
[im 5/33  brain]
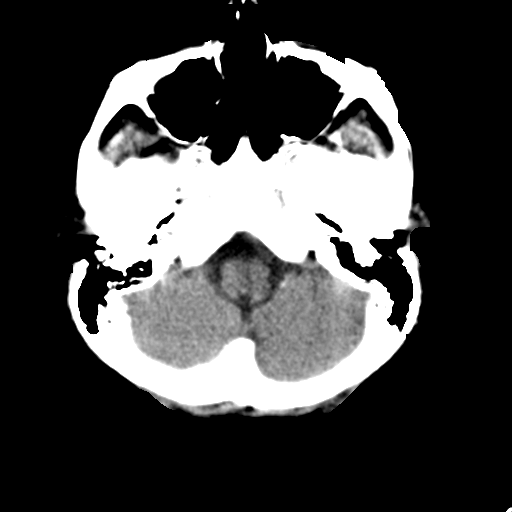
[im 10/33  brain]
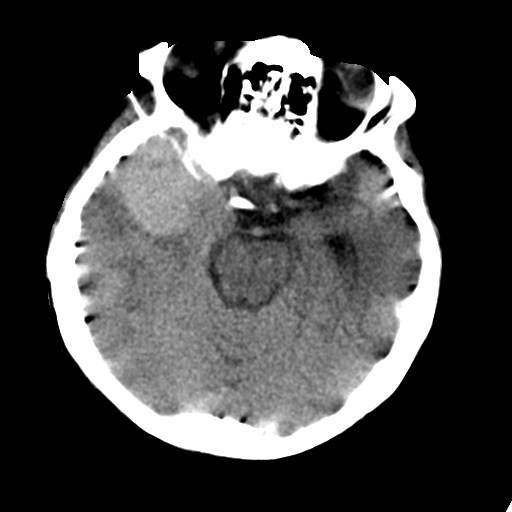
[im 12/33  brain]
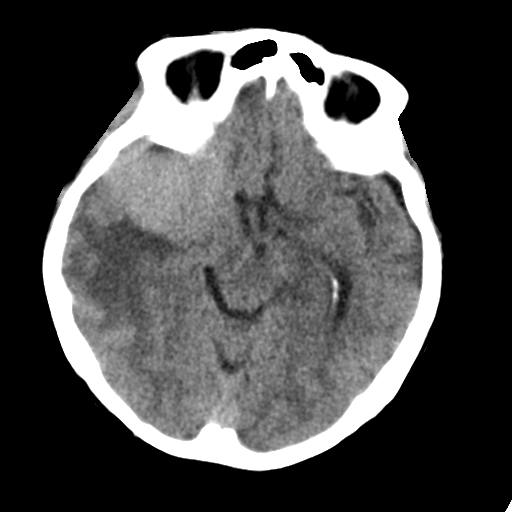
[im 14/33  brain]
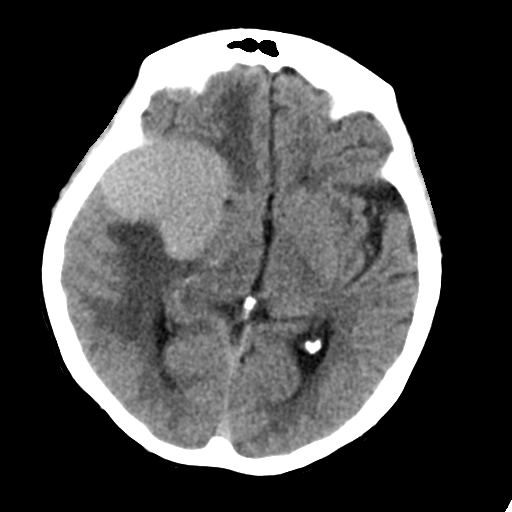
[im 14/33  bone]
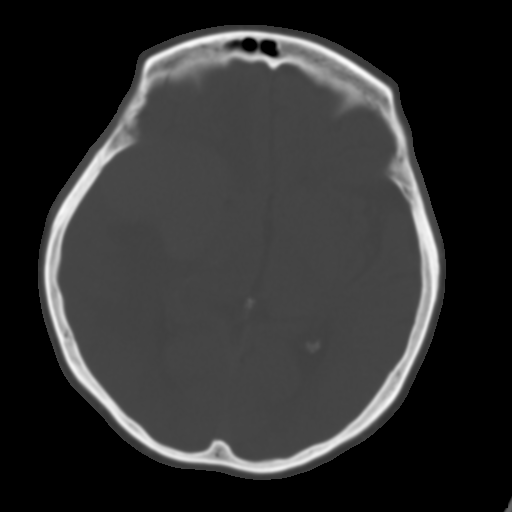
[im 19/33  brain]
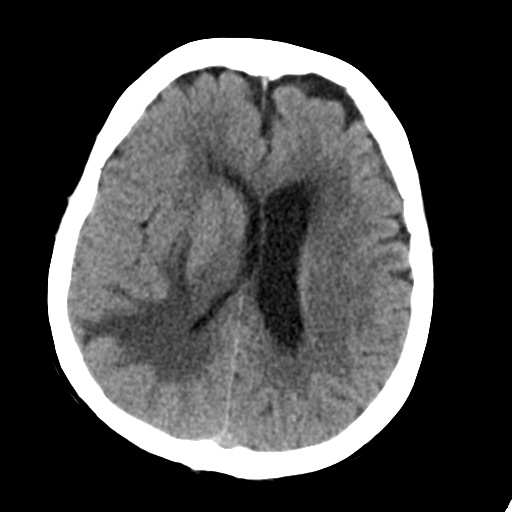
[im 21/33  brain]
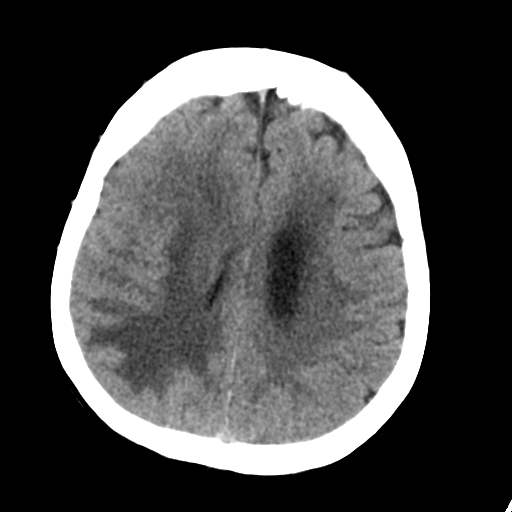
[im 23/33  brain]
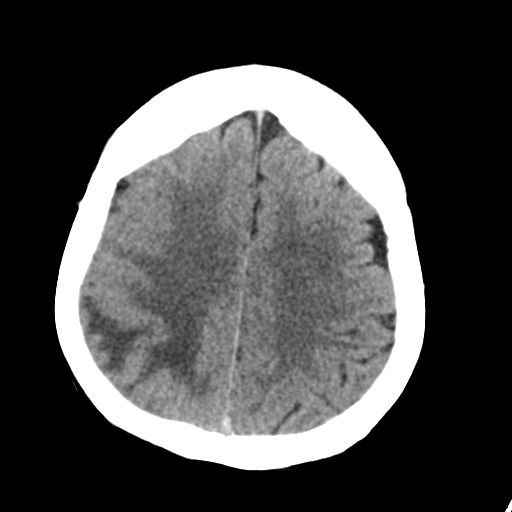
[im 28/33  brain]
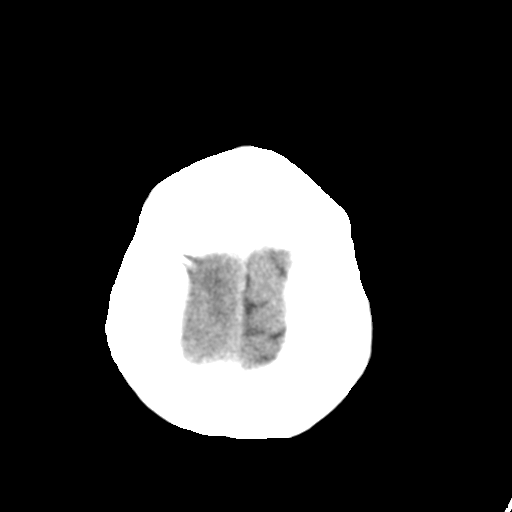
[im 28/33  bone]
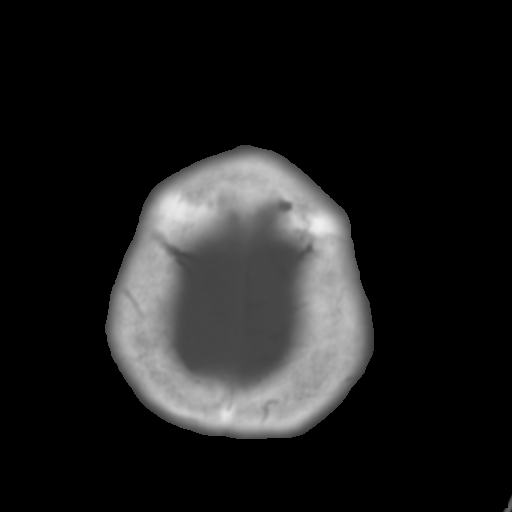
[im 30/33  brain]
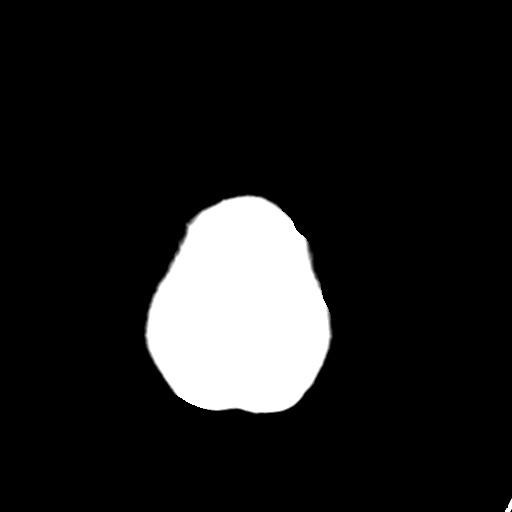

[Series 5: head 3.0 mpr cor · coronal · 0.32mm/px · 3 of 67 slices shown]
[im 17/67  brain]
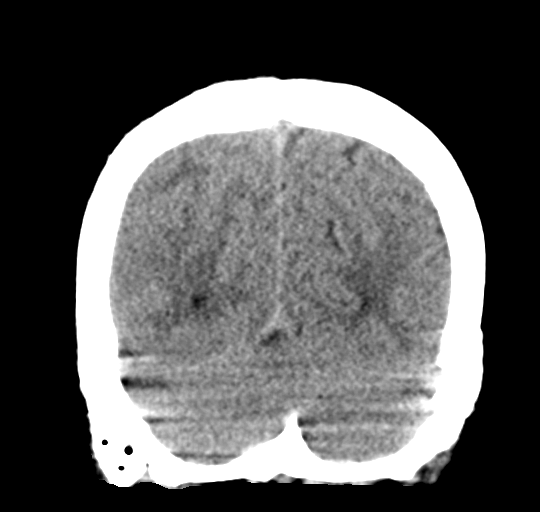
[im 34/67  brain]
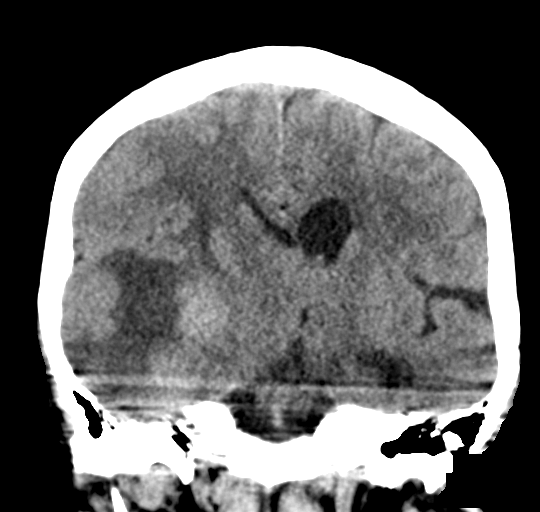
[im 50/67  brain]
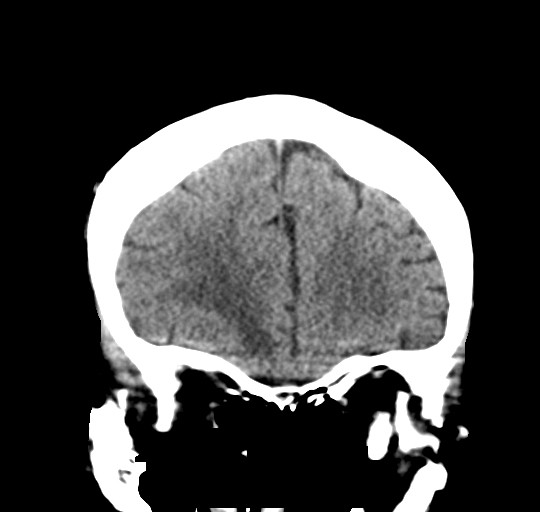

[Series 10: head 3.0 mpr sag · sagittal · 0.32mm/px · 2 of 67 slices shown]
[im 23/67  brain]
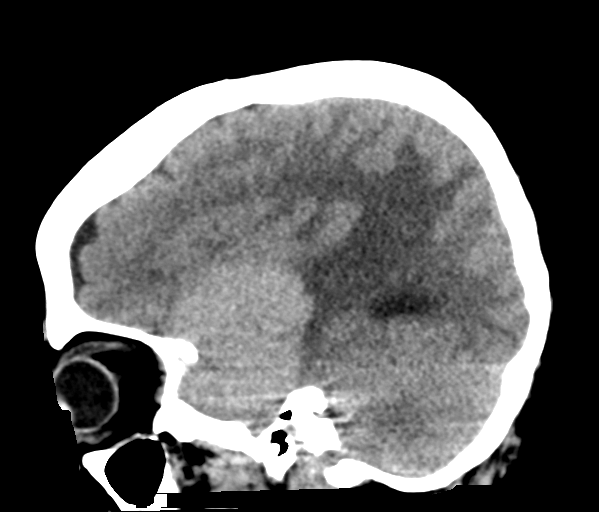
[im 45/67  brain]
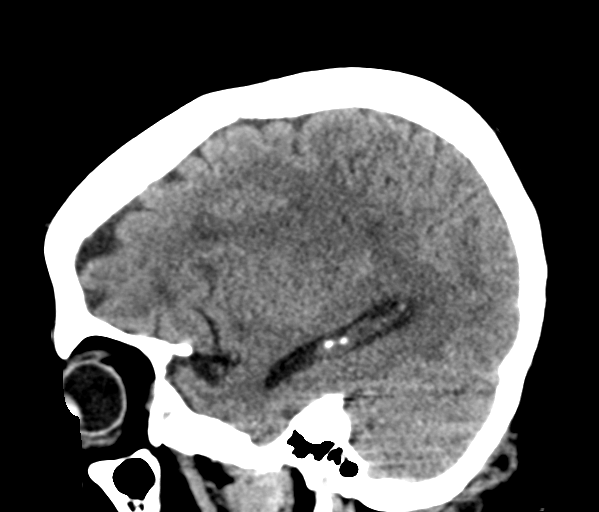

[15 of 47 positions shown; findings below may reference images not displayed]

FINDINGS: Brain: Redemonstrated hyperdense mass at the right basal
ganglia/anterior temporal region, measures approximately 5 by 4.4 cm
and is grossly stable in size by CT. Considerable surrounding right
hemispheric edema but there appears to be less mass effect on the
right lateral ventricle which appears less compressed. Midline shift
also appears slightly decreased, measuring 8 mm compared with 11 mm
previously. No interval hemorrhage. Underlying chronic small vessel
ischemic change of the white matter.

Vascular: No hyperdense vessels.  No unexpected calcification

Skull: Normal. Negative for fracture or focal lesion.

Sinuses/Orbits: No acute finding.

Other: None
IMPRESSION: 1. Grossly stable hyperdense mass in the right basal
ganglial/anterior temporal region without interval hemorrhage.
Considerable edema surrounding the mass but there appears to be less
mass effect on the right lateral ventricle and overall decreased
midline shift to the left, now measuring 8 mm compared with 11 mm
previously.
2. Underlying chronic small vessel ischemic changes of the white
matter.

## 2022-02-03 IMAGING — CT CTA GI BLEED
3 of 18 series · 12 of 48 positions shown, 17 images · IV contrast (APPLIED)
Comparison: [DATE]

CLINICAL DATA: Worsening hypotension and decreasing hemoglobin

EXAM:
CTA ABDOMEN AND PELVIS WITHOUT AND WITH CONTRAST
TECHNIQUE: Multidetector CT imaging of the abdomen and pelvis was performed
using the standard protocol during bolus administration of
intravenous contrast. Multiplanar reconstructed images and MIPs were
obtained and reviewed to evaluate the vascular anatomy.

[Series 6: arterial 3.0 i40f 2 · axial · arterial · 0.92mm/px · z∈[-1114,-934]mm · 4 of 179 slices shown]
[im 20/179  soft-tissue]
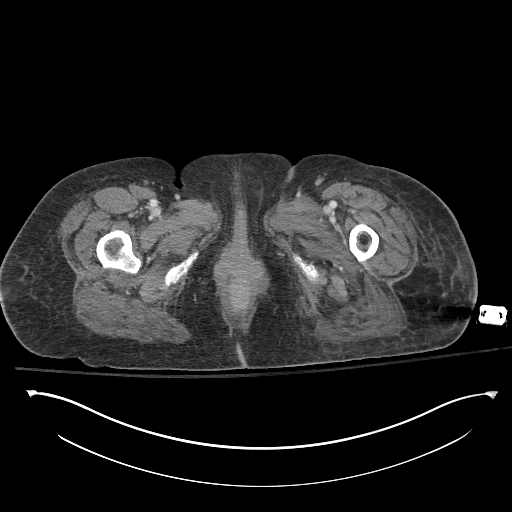
[im 40/179  soft-tissue]
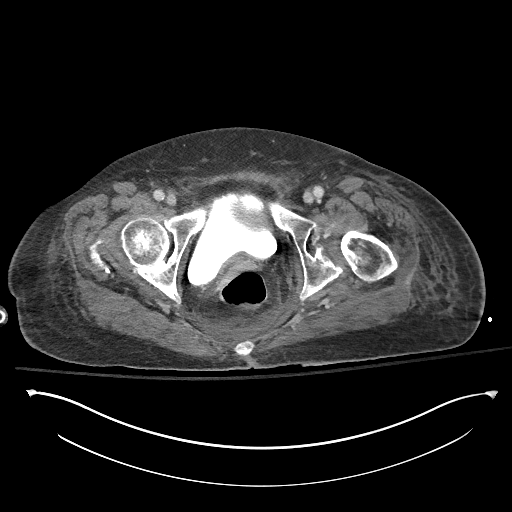
[im 60/179  soft-tissue]
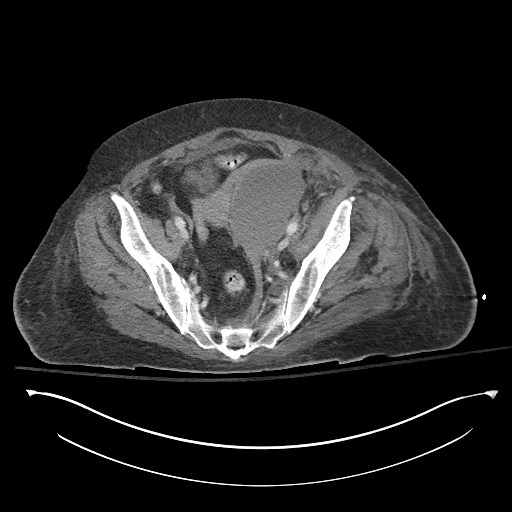
[im 80/179  soft-tissue]
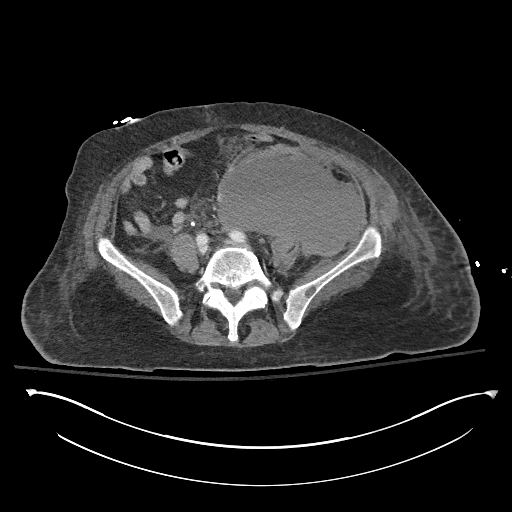

[Series 7: venous 5.0 i30f 1 · axial · portal-venous · 0.92mm/px · z∈[-1055,-720]mm · 4 of 113 slices shown, 9 images]
[im 23/113  soft-tissue]
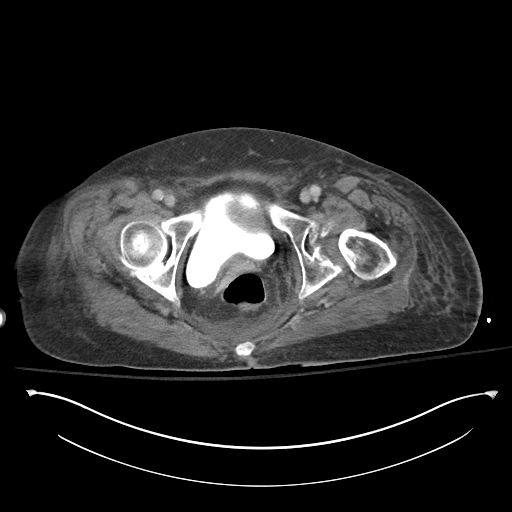
[im 23/113  lung]
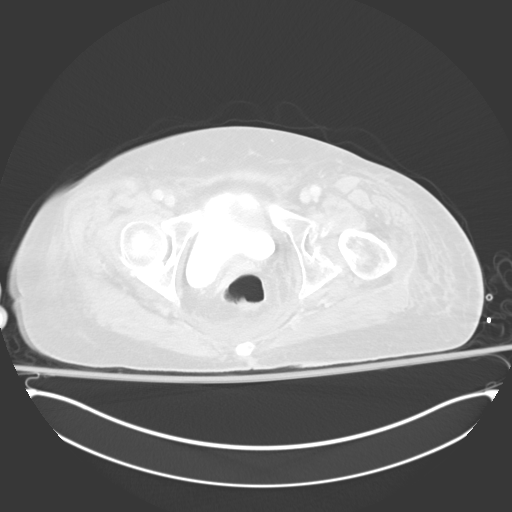
[im 23/113  bone]
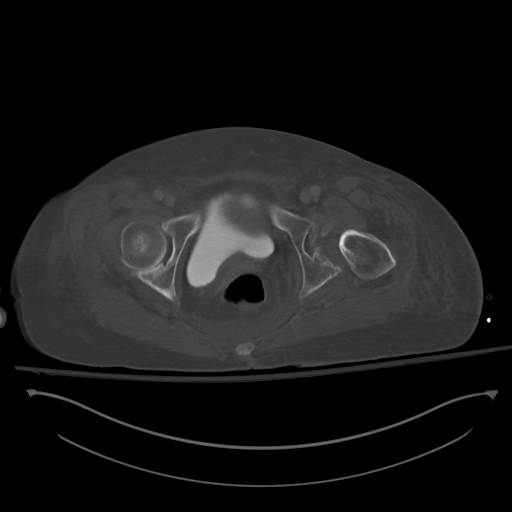
[im 45/113  soft-tissue]
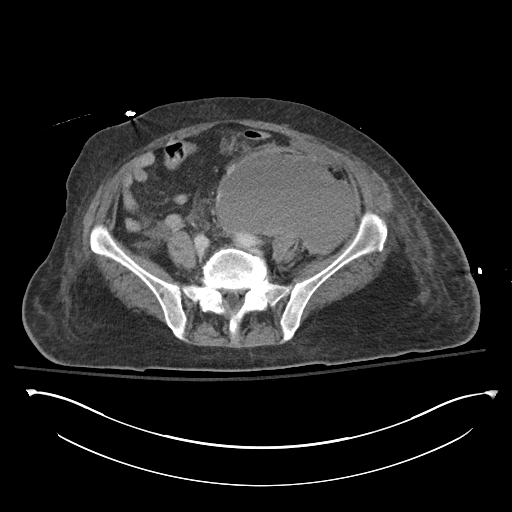
[im 45/113  lung]
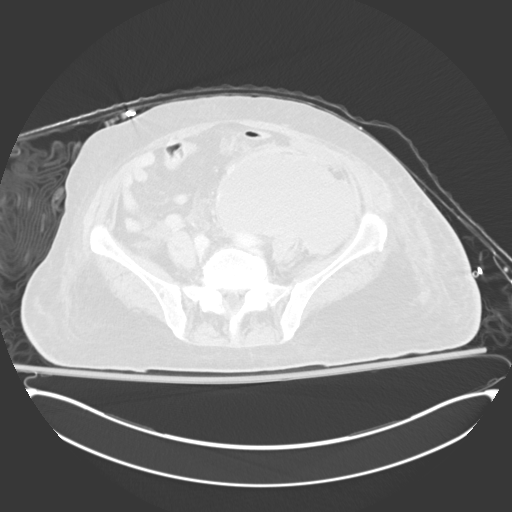
[im 68/113  soft-tissue]
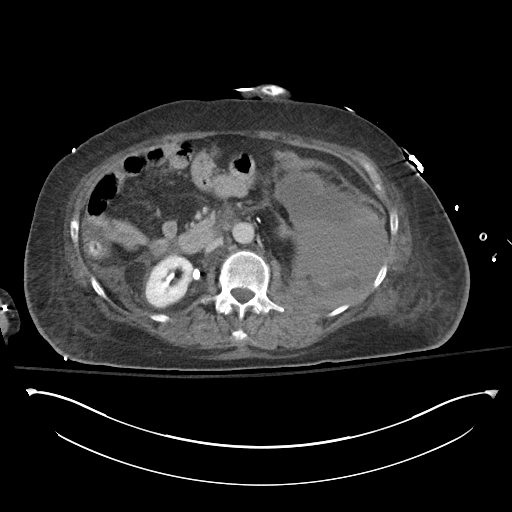
[im 68/113  lung]
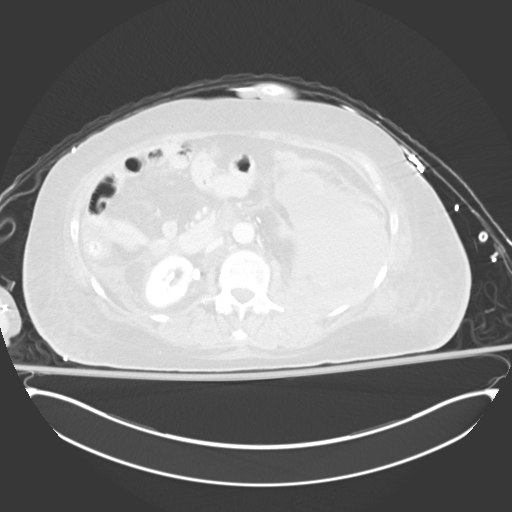
[im 90/113  soft-tissue]
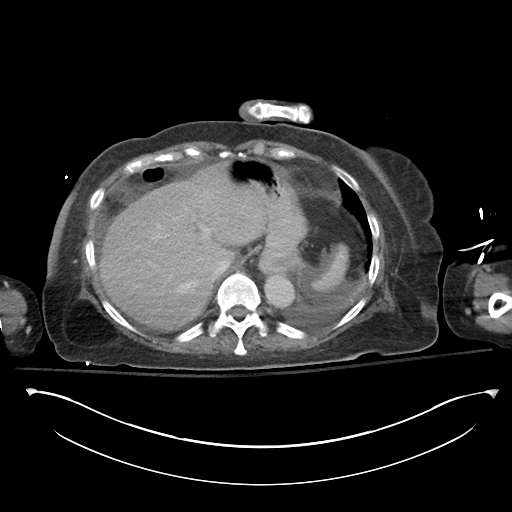
[im 90/113  lung]
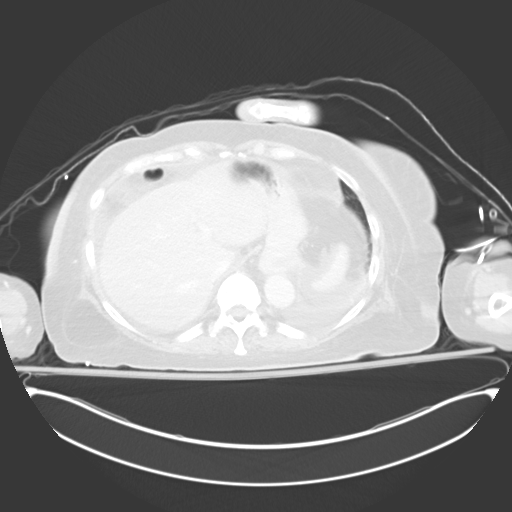

[Series 8: abd/ pelvis 5.0 i30f 2 · axial · 0.90mm/px · z∈[-1052,-717]mm · 4 of 113 slices shown]
[im 23/113  soft-tissue]
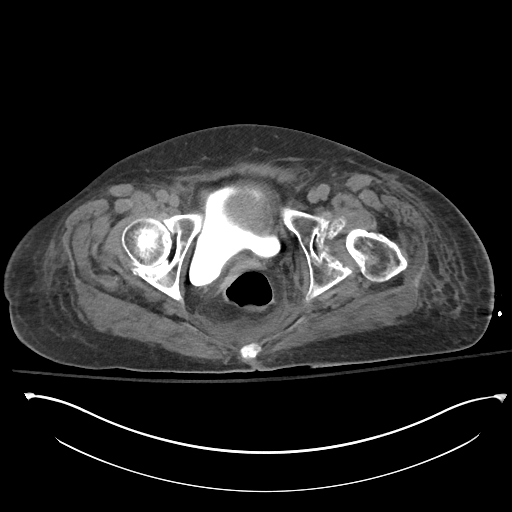
[im 45/113  soft-tissue]
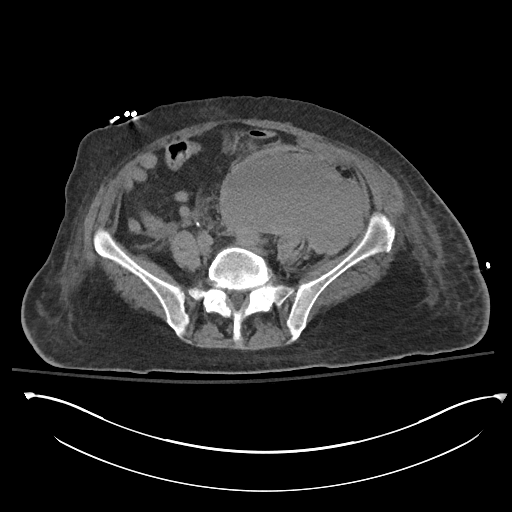
[im 68/113  soft-tissue]
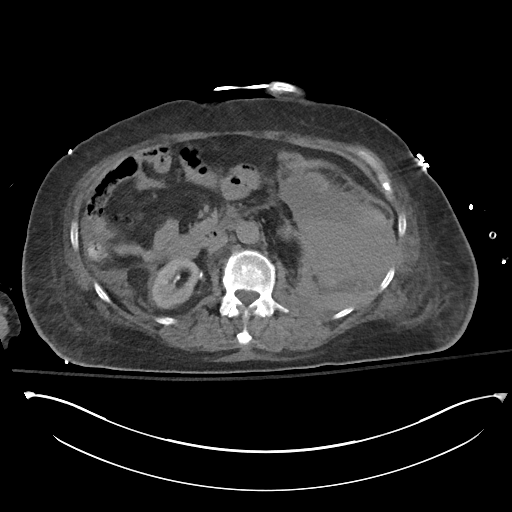
[im 90/113  soft-tissue]
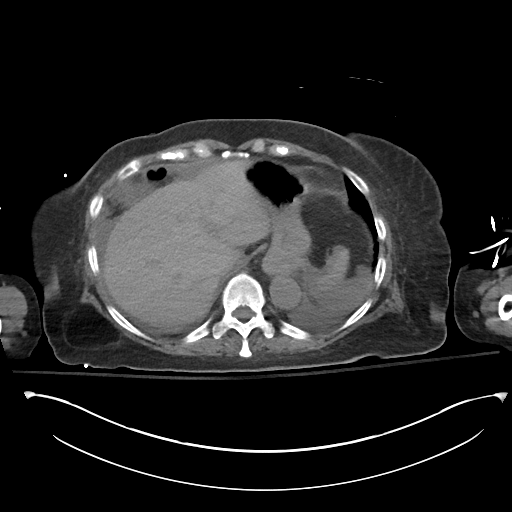

[12 of 48 positions shown; findings below may reference images not displayed]

RADIATION DOSE REDUCTION: This exam was performed according to the
departmental dose-optimization program which includes automated
exposure control, adjustment of the mA and/or kV according to
patient size and/or use of iterative reconstruction technique.

CONTRAST:  100mL OMNIPAQUE IOHEXOL 300 MG/ML  SOLN
FINDINGS: VASCULAR

Aorta: Atherosclerotic calcifications of the abdominal aorta are
noted without aneurysmal dilatation or dissection.

Celiac: Patent without evidence of aneurysm, dissection, vasculitis
or significant stenosis.

SMA: Patent without evidence of aneurysm, dissection, vasculitis or
significant stenosis.

Renals: Both renal arteries are patent without evidence of aneurysm,
dissection, vasculitis, fibromuscular dysplasia or significant
stenosis.

IMA: Patent without evidence of aneurysm, dissection, vasculitis or
significant stenosis.

Inflow: Iliac arteries are within normal limits.

Veins: IVC filter is noted in place bridging the confluence of the
iliac veins due to an accessory left renal vein.

Review of the MIP images confirms the above findings.

NON-VASCULAR

Lower chest: Bilateral pleural effusions are noted with mild
bibasilar atelectasis. Changes of lower lobe pulmonary emboli are
noted similar to that seen on prior exams.

Hepatobiliary: Single calcified gallstone is noted within the
gallbladder. The liver appears within normal limits. Previously seen
periportal edema has cleared.

Pancreas: Unremarkable. No pancreatic ductal dilatation or
surrounding inflammatory changes.

Spleen: Normal in size without focal abnormality.

Adrenals/Urinary Tract: Adrenal glands are within normal limits.
Right kidney demonstrates a normal enhancement pattern with normal
excretion. Left kidney also demonstrates a normal enhancement
pattern with normal excretion although the kidney is somewhat
displaced by retroperitoneal hematoma as described below. Bladder is
partially distended with opacified urine but also displaced to the
right secondary to the retroperitoneal hematoma.

Stomach/Bowel: Mild diverticular change of the colon is noted. No
obstructive or inflammatory changes of colon are seen. Small bowel
on the left are displaced by the retroperitoneal hematoma. The
appendix is not well visualized. Stomach and small bowel are within
normal limits.

Lymphatic: No sizable lymphadenopathy is noted.

Reproductive: Uterus and bilateral adnexa are unremarkable.

Other: Minimal free pelvic fluid is noted new from the prior exam
this is likely related to the known retroperitoneal hematoma. There
is a large retroperitoneal hematoma identified on the left with a
hematocrit level within. Some scattered areas of increased
attenuation are noted along the left lateral margin which persist on
all 3 phases likely representing thrombus within. No active
extravasation is noted on this exam. The hematoma measures
approximately 16 x 11 cm in greatest dimension. It extends for
approximately 22 cm in craniocaudad projection.

Musculoskeletal: Degenerative changes of lumbar spine are noted.
Severe degenerative change of the left hip joint is noted as well.
No acute bony abnormality is seen.
IMPRESSION: VASCULAR

No areas of active extravasation are seen although a large
left-sided retroperitoneal hematoma is identified as described with
significant mass effect upon the adjacent structures. Hematocrit
level is noted within.

NON-VASCULAR

Diverticulosis without diverticulitis

Cholelithiasis without complicating factors.

Small effusions and bibasilar atelectasis. Residual changes of prior
pulmonary emboli are seen.

These results will be called to the ordering clinician or
representative by the Radiologist Assistant, and communication
documented in the PACS or [REDACTED].

## 2022-02-03 MED ORDER — DOCUSATE SODIUM 100 MG PO CAPS
100.0000 mg | ORAL_CAPSULE | Freq: Two times a day (BID) | ORAL | Status: DC
  Administered 2022-02-03 – 2022-04-01 (×69): 100 mg via ORAL
  Filled 2022-02-03 (×95): qty 1

## 2022-02-03 MED ORDER — DEXAMETHASONE 4 MG PO TABS
4.0000 mg | ORAL_TABLET | Freq: Every day | ORAL | Status: DC
  Administered 2022-02-14 – 2022-03-05 (×20): 4 mg via ORAL
  Filled 2022-02-03 (×20): qty 1

## 2022-02-03 MED ORDER — POLYETHYLENE GLYCOL 3350 17 G PO PACK
17.0000 g | PACK | Freq: Every day | ORAL | Status: DC
  Administered 2022-02-03 – 2022-02-05 (×3): 17 g via ORAL
  Filled 2022-02-03 (×3): qty 1

## 2022-02-03 MED ORDER — DEXAMETHASONE 2 MG PO TABS
6.0000 mg | ORAL_TABLET | Freq: Two times a day (BID) | ORAL | Status: AC
  Administered 2022-02-03 – 2022-02-06 (×6): 6 mg via ORAL
  Filled 2022-02-03 (×3): qty 4
  Filled 2022-02-03 (×2): qty 1
  Filled 2022-02-03: qty 4

## 2022-02-03 MED ORDER — LEVOFLOXACIN 500 MG PO TABS
500.0000 mg | ORAL_TABLET | Freq: Every day | ORAL | Status: DC
  Administered 2022-02-03 – 2022-02-05 (×3): 500 mg via ORAL
  Filled 2022-02-03 (×3): qty 1

## 2022-02-03 MED ORDER — IOHEXOL 300 MG/ML  SOLN
100.0000 mL | Freq: Once | INTRAMUSCULAR | Status: AC | PRN
  Administered 2022-02-03: 100 mL via INTRAVENOUS

## 2022-02-03 MED ORDER — DEXAMETHASONE 4 MG PO TABS
4.0000 mg | ORAL_TABLET | Freq: Two times a day (BID) | ORAL | Status: AC
  Administered 2022-02-06 – 2022-02-13 (×14): 4 mg via ORAL
  Filled 2022-02-03 (×14): qty 1

## 2022-02-03 NOTE — Progress Notes (Signed)
Physical Therapy Treatment Patient Details Name: Jennifer Pacheco MRN: [id number] DOB: [birth year] Today's Date: 02/03/2022   History of Present Illness Pt is 66 yo female who was found down in her home and taken to [facility]. 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 Pt with retroperitoneal bleed and IVC filter placed 6/5. PMH: DM2, HTN    PT Comments    Pt seen by PT/OT, used caution per nursing request due to retroperitoneal bleed yesterday. Pt needed min A to come to EOB, stood with min A +2 but continued to have difficulty coordinating stepping feet to move towards HOB. She does have LLE weakness but coordination is her bigger issue with mobility than the weakness at this point. LLE there ex performed in supine. Pt has some confusion and also attention deficits but she is VERY HOH which makes her seem more confused. PT will continue to follow.    Recommendations for follow up therapy are one component of a multi-disciplinary discharge planning process, led by the attending physician.  Recommendations may be updated based on patient status, additional functional criteria and insurance authorization.  Follow Up Recommendations  Skilled nursing-short term rehab (<3 hours/day)     Assistance Recommended at Discharge Frequent or constant Supervision/Assistance  Patient can return home with the following A little help with walking and/or transfers;A little help with bathing/dressing/bathroom;Assistance with cooking/housework;Direct supervision/assist for medications management;Direct supervision/assist for financial management;Assist for transportation;Help with stairs or ramp for entrance   Equipment Recommendations  Other (comment) (TBD)    Recommendations for Other Services       Precautions / Restrictions Precautions Precautions: Fall Restrictions Weight Bearing Restrictions: No     Mobility  Bed Mobility Overal bed mobility: Needs  Assistance Bed Mobility: Rolling, Sidelying to Sit, Sit to Supine Rolling: Min assist Sidelying to sit: Min assist, +2 for safety/equipment   Sit to supine: Min assist, +2 for safety/equipment   General bed mobility comments: heavy cues required    Transfers Overall transfer level: Needs assistance Equipment used: 2 person hand held assist Transfers: Sit to/from Stand Sit to Stand: Min assist, +2 physical assistance, +2 safety/equipment           General transfer comment: pt able to initiate standing with min A +2 for support, however, as she was on eval (2 days ago) she had difficulty sequencing LE's to step to [facility]    Ambulation/Gait               General Gait Details: unable to sequence feet to step and cautious today due to retriperitoneal bleed yesterday   Stairs             Wheelchair Mobility    Modified Rankin (Stroke Patients Only)       Balance Overall balance assessment: Needs assistance, History of Falls Sitting-balance support: Feet supported Sitting balance-Leahy Scale: Fair     Standing balance support: Bilateral upper extremity supported, During functional activity Standing balance-Leahy Scale: Poor Standing balance comment: reliant on therapists, L lean                            Cognition Arousal/Alertness: Awake/alert Behavior During Therapy: Impulsive Overall Cognitive Status: No family/caregiver present to determine baseline cognitive functioning Area of Impairment: Orientation, Attention, Memory, Following commands, Safety/judgement, Awareness, Problem solving                 Orientation Level: Disoriented  to, Time, Situation Current Attention Level: Focused Memory: Decreased short-term memory, Decreased recall of precautions Following Commands: Follows one step commands inconsistently Safety/Judgement: Decreased awareness of safety, Decreased awareness of deficits Awareness: Intellectual Problem Solving:  Difficulty sequencing, Requires tactile cues, Requires verbal cues General Comments: pt not as impaired as she seems initially but very HOH so will sometimes answer wrong question making her seem more confused than she is. Processing is slow and pt with decreased attention        Exercises General Exercises - Lower Extremity Ankle Circles/Pumps: AROM, Left, 10 reps, Supine Heel Slides: AAROM, Left, 10 reps, Supine Straight Leg Raises: AAROM, Left, 10 reps, Supine    General Comments General comments (skin integrity, edema, etc.): VSS on RA      Pertinent Vitals/Pain Pain Assessment Pain Assessment: Faces Faces Pain Scale: Hurts a little bit Pain Location: LLQ Pain Descriptors / Indicators: Guarding, Grimacing Pain Intervention(s): Limited activity within patient's tolerance, Monitored during session    Home Living                          Prior Function            PT Goals (current goals can now be found in the care plan section) Acute Rehab PT Goals Patient Stated Goal: none stated PT Goal Formulation: Patient unable to participate in goal setting Time For Goal Achievement: 02/15/22 Potential to Achieve Goals: Good Progress towards PT goals: Progressing toward goals    Frequency    Min 3X/week      PT Plan Current plan remains appropriate    Co-evaluation PT/OT/SLP Co-Evaluation/Treatment: Yes Reason for Co-Treatment: Complexity of the patient's impairments (multi-system involvement);Necessary to address cognition/behavior during functional activity;For patient/therapist safety PT goals addressed during session: Mobility/safety with mobility;Balance;Strengthening/ROM OT goals addressed during session: ADL's and self-care      AM-PAC PT "6 Clicks" Mobility   Outcome Measure  Help needed turning from your back to your side while in a flat bed without using bedrails?: A Little Help needed moving from lying on your back to sitting on the side of a  flat bed without using bedrails?: A Little Help needed moving to and from a bed to a chair (including a wheelchair)?: A Little Help needed standing up from a chair using your arms (e.g., wheelchair or bedside chair)?: Total Help needed to walk in hospital room?: Total Help needed climbing 3-5 steps with a railing? : Total 6 Click Score: 12    End of Session Equipment Utilized During Treatment: Gait belt Activity Tolerance: Patient tolerated treatment well Patient left: in bed;with call bell/phone within reach;with bed alarm set (posey belt chair alarm) Nurse Communication: Mobility status PT Visit Diagnosis: Unsteadiness on feet (R26.81);Difficulty in walking, not elsewhere classified (R26.2)     Time: 7001-7494 PT Time Calculation (min) (ACUTE ONLY): 31 min  Charges:  $Therapeutic Activity: 8-22 mins                     Leighton Roach, [place]  Pager [phone] Office [place] 02/03/2022, 11:38 AM

## 2022-02-03 NOTE — Progress Notes (Signed)
Estanislado Pandy, patient's friend, is at bedside. First support person to come to the hospital for this patient. Mary from palliative care and Dr. Lynetta Mare made aware.   Normajean Baxter, RN

## 2022-02-03 NOTE — Progress Notes (Addendum)
NAME:  Jennifer Pacheco, MRN:  [id number], DOB:  [birth year], LOS: 4 ADMISSION DATE:  01/30/2022, CONSULTATION DATE:  6/2 REFERRING MD:  Dr. Regenia Skeeter, CHIEF COMPLAINT:  PE; hypotension   History of Present Illness:  66 year old female with pertinent PMH of HTN, DMT2 presents to [facility] ED on 6/2 with AMS.  Patient was found down on floor at home altered covered in feces and urine and someone checked on her today on 6/2.  EMS transported patient to [facility].  Patient altered but states that she fell on her left buttock area and has been unable to to get up since.  Unsure exactly how long she has been down.  Patient noted to be hypoxic and hypotensive. Afebrile. Started on IV fluids for hypotension. Sats improved on [place]. CT chest showing submassive PE with RV/LV ratio 1.2-1.5. Started on heparin. Broad spectrum abx started. CT abd and UA concerning for UTI w/ possible pyelonephritis. CXR unremarkable. 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 NSG consulted recommend MRI and decadron. Recommend for transfer to [facility]. PCCM consulted for ICU admission.  Pertinent  Medical History  HTN DMT2  Significant Hospital Events: Including procedures, antibiotic start and stop dates in addition to other pertinent events   6/2 Admit to [facility] w/ hypotension and PE; transfer to [facility] 6/3 Tx to [facility], PCU.  On heparin, 1 unit PRBC after transfer to [facility].  On RA.  6/5 Concern for bleeding, Hgb drop from 8.2 to 6.3, PRBC ordered, tx back to ICU. Heparin stopped.   Interim History / Subjective:  BP, Hgb dropped overnight. Required PRBC.  CT imaging revealed large RP bleed  Afebrile  Glucose range 149-230 On bicarbonate infusion  RN reports I/O cath overnight, attempting purewick catheter this am  Objective   Blood pressure 112/76, pulse 98, temperature 98.1 F (36.7 C), temperature source  Axillary, resp. rate (!) 21, height '5\' 5"'$  (1.651 m), weight 69.5 kg, SpO2 93 %.        Intake/Output Summary (Last 24 hours) at 02/03/2022 0719 Last data filed at 02/03/2022 0700 Gross per 24 hour  Intake 4525.82 ml  Output 500 ml  Net 4025.82 ml   Filed Weights   01/30/22 2053 01/31/22 0355 01/31/22 1100  Weight: 69.5 kg 69.5 kg 69.5 kg    Examination: General: adult female sitting up in bed in NAD HEENT: MM pink/moist, anicteric, hair nested on top of her head Neuro: Awake, alert, speech clear, repetitive at times, impulsive. Moves all extremities left side can lift arm hold arm up against gravity, minimal movement on left lower independently  CV: s1s2 RRR, no m/r/g PULM: non-labored at rest, lungs bilaterally clear with good air entry  GI: soft, bsx4 active  Extremities: warm/dry, trace dependent edema  Skin: no rashes or lesions  Resolved Hospital Problem list   Elevated CK   Assessment & Plan:   Submassive Pulmonary Embolus  Shock RV/LV ratio 1.28-1.54.  ECHO with LVEF 60-65%, RVSP normal but note limited study.  Shock likely combination of obstructive w/ septic shock, acute blood loss anemia. Transfused 2 units 6/5.  -hold heparin, would not consider restarting for minimum of 72 hours post stable hgb / requiring no transfusion if at all given her brain mass.  Will need to further discuss risks / benefits with family when available.  -appreciate IR assistance with patient care / IVC filter  placement  -off vasopressors after transfusion, suspect shock largely related to ABLA in setting of RP bleed -trend CBC  UTI w/ possible Pyelonephtiritis -levaquin D5/5 -monitor urinary symptoms   Acute Metabolic Encephalopathy UTI with pyelo and sepsis; CT head with possible glioblastoma. Ethanol, acetaminophen, salicylate, UDS all normal.  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 Required transfusion overnight 6/6  Hypokalemia  -follow BMP, Mg+ -replace electrolytes as indicated  Anemia / ABLA  In setting of RP bleed, likely additional chronic element with suspected malignancy  -follow CBC -transfuse for Hgb <7% or concern for further active bleeding   Retroperitoneal Bleed RP bleed of 16x11cm in greatest dimension, extends 22 cm craniocaudad projection. No extravasation.  -monitor abdominal exam  -hopeful we are nearing place of physiologic tamponade / no further bleeding  -supportive care   Nausea  -PRN zofran   Hyperglycemia DMT2 Hgb A1c 5.5  -SSI, sensitive scale   Cholelithiasis RUQ [place] mild pericholecystic fluid, mild gallbladder wall thickening, CBD/liver unremarkable -follow clinical exam    Distal Esophageal Thickening Concern for neoplasm vs. Esophagitis.   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 She indicated his number was in her phone - number is [phone]  Attempted to call him with no answer, message left for return call.   6/6 - no family present.  The patient reports she does [place] two [place]   Critical care time: 354minutes    BNoe Gens MSN, APRN, NP-C, AGACNP-BC [facility] 02/03/2022, 7:19 AM   Please see [email or web address] for pager details.   From 7A-7P if no response, please call [phone] After hours, please call ELink [phone]

## 2022-02-03 NOTE — Assessment & Plan Note (Signed)
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

## 2022-02-03 NOTE — Progress Notes (Signed)
Occupational Therapy Treatment Patient Details Name: Jennifer Pacheco MRN: [id number] DOB: [birth year] Today's Date: 02/03/2022   History of present illness Patient is a 66 year old female presented to [facility] ED on 6/2 with AMS found down at home. Admitted and found to be hypoxic and hypotensive, CT chest showing submassive PE, CT abd and UA concerning for UTI w/ possible pyelonephritis. 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 PHMx: HTN, DMT2   OT comments  Lashaya is making incremental progress towards her acute OT goals. Pt lethargic initially however LOA improved once EOB. Pt noted to to be extremely HOH and needs increased time for processing, with loud/clear communication pt's cognition and ability to communicate and follow commands improved. She was and to transfer to sitting in min A and heavy verbal cues, and stood with min A +2. Pt is limited by LLE weakness, impaired coordination, cognition, and general weakness. OT to continue to follow, d/c remains appropriate.    Recommendations for follow up therapy are one component of a multi-disciplinary discharge planning process, led by the attending physician.  Recommendations may be updated based on patient status, additional functional criteria and insurance authorization.    Follow Up Recommendations  Skilled nursing-short term rehab (<3 hours/day) (pending pt progression and family support)    Assistance Recommended at Discharge Frequent or constant Supervision/Assistance  Patient can return home with the following  A little help with walking and/or transfers;A little help with bathing/dressing/bathroom;Assistance with cooking/housework;Assistance with feeding;Help with stairs or ramp for entrance;Assist for transportation;Direct supervision/assist for financial management;Direct  supervision/assist for medications management   Equipment Recommendations  Other (comment)       Precautions / Restrictions Precautions Precautions: Fall Restrictions Weight Bearing Restrictions: No       Mobility Bed Mobility Overal bed mobility: Needs Assistance Bed Mobility: Rolling, Sidelying to Sit, Sit to Supine Rolling: Min assist Sidelying to sit: Min assist, +2 for safety/equipment   Sit to supine: Min assist, +2 for safety/equipment   General bed mobility comments: heavy cues required    Transfers Overall transfer level: Needs assistance Equipment used: 2 person hand held assist Transfers: Sit to/from Stand Sit to Stand: Min assist, +2 physical assistance, +2 safety/equipment     Step pivot transfers: Min assist, +2 physical assistance, +2 safety/equipment     General transfer comment: stood with increased time and effort, pivotal steps take to [facility] with min A +2.     Balance Overall balance assessment: Needs assistance, History of Falls Sitting-balance support: Feet supported Sitting balance-Leahy Scale: Fair     Standing balance support: Bilateral upper extremity supported, During functional activity Standing balance-Leahy Scale: Poor Standing balance comment: reliant on therapists                           ADL either performed or assessed with clinical judgement   ADL Overall ADL's : Needs assistance/impaired     Grooming: Set up;Sitting                   Toilet Transfer: Minimal assistance;+2 for physical assistance;+2 for safety/equipment Toilet Transfer Details (indicate cue type and reason): simulated. at EOB         Functional mobility during ADLs: Minimal assistance;+2 for physical assistance;+2 for safety/equipment General ADL Comments: heavy cues requried for all tasks and increased time  for processing and initiation    Extremity/Trunk Assessment Upper Extremity Assessment Upper Extremity Assessment: Generalized  weakness   Lower Extremity Assessment Lower Extremity Assessment: Defer to PT evaluation        Vision   Vision Assessment?: Vision impaired- to be further tested in functional context Additional Comments: questionable R gaze vs positioning   Perception Perception Perception: Not tested   Praxis Praxis Praxis: Not tested    Cognition Arousal/Alertness: Awake/alert Behavior During Therapy: Impulsive Overall Cognitive Status: No family/caregiver present to determine baseline cognitive functioning Area of Impairment: Orientation, Attention, Memory, Following commands, Safety/judgement, Awareness, Problem solving                 Orientation Level: Disoriented to, Time, Situation Current Attention Level: Focused Memory: Decreased short-term memory, Decreased recall of precautions Following Commands: Follows one step commands inconsistently Safety/Judgement: Decreased awareness of safety, Decreased awareness of deficits Awareness: Intellectual Problem Solving: Difficulty sequencing, Requires tactile cues, Requires verbal cues General Comments: difficult to fully assesss, HOH vs cognition. Follows most one step commands, slow processing.              General Comments VSS on RA    Pertinent Vitals/ Pain       Pain Assessment Pain Assessment: Faces Pain Score: 2  Pain Location: abdomen Pain Descriptors / Indicators: Sore Pain Intervention(s): Limited activity within patient's tolerance, Monitored during session   Frequency  Min 2X/week        Progress Toward Goals  OT Goals(current goals can now be found in the care plan section)     Acute Rehab OT Goals Patient Stated Goal: to lay down OT Goal Formulation: With patient Time For Goal Achievement: 02/15/22 Potential to Achieve Goals: Good ADL Goals Pt Will Perform Grooming: Independently;standing Pt Will Perform Upper Body Bathing: Independently;sitting;standing Pt Will Perform Lower Body Bathing:  Independently;sit to/from stand Pt Will Perform Upper Body Dressing: Independently;sitting;standing Pt Will Perform Lower Body Dressing: Independently;sit to/from stand Pt Will Transfer to Toilet: Independently;ambulating;bedside commode Pt Will Perform Toileting - Clothing Manipulation and hygiene: Independently;sit to/from stand Additional ADL Goal #1: Pt will be independent in and out of bed  Plan Discharge plan remains appropriate    Co-evaluation    PT/OT/SLP Co-Evaluation/Treatment: Yes Reason for Co-Treatment: Complexity of the patient's impairments (multi-system involvement);Necessary to address cognition/behavior during functional activity;For patient/therapist safety;To address functional/ADL transfers   OT goals addressed during session: ADL's and self-care      AM-PAC OT "6 Clicks" Daily Activity     Outcome Measure   Help from another person eating meals?: A Little Help from another person taking care of personal grooming?: A Little Help from another person toileting, which includes using toliet, bedpan, or urinal?: A Little Help from another person bathing (including washing, rinsing, drying)?: A Little Help from another person to put on and taking off regular upper body clothing?: A Little Help from another person to put on and taking off regular lower body clothing?: A Little 6 Click Score: 18    End of Session Equipment Utilized During Treatment: Gait belt  OT Visit Diagnosis: Unsteadiness on feet (R26.81);Other abnormalities of gait and mobility (R26.89);History of falling (Z91.81);Other symptoms and signs involving cognitive function   Activity Tolerance Patient tolerated treatment well   Patient Left in bed;with call bell/phone within reach;with bed alarm set   Nurse Communication Mobility status        Time: 2426-8341 OT Time Calculation (min): 23 min  Charges: OT General Charges $OT Visit:  1 Visit OT Treatments $Therapeutic Activity: 8-22  mins    Montzerrat Brunell A Shamyia Grandpre 02/03/2022, 11:16 AM

## 2022-02-03 NOTE — Progress Notes (Signed)
Speech Language Pathology Treatment: Dysphagia  Patient Details Name: Jennifer Pacheco MRN: [id number] DOB: Jan 18, 1956 Today's Date: 02/03/2022 Time: 9024-0973 SLP Time Calculation (min) (ACUTE ONLY): 16 min  Assessment / Plan / Recommendation Clinical Impression  Pt had BSE 6/4 demonstrating mostly cognitive dysphagia with significantly decreased attention and recommended regular/thin liquids. Pt made NPO yesterday due to increased lethargy and retroperitoneal hematoma Today she is cleared to initiate diet (clears) if still tolerating. Although, mild-moderately confused, her attention was improved for po's. Held the cup with supervision and no coughing with straw as there was during BSE. Mastication was significantly prolonged due to endentulous and cognitive combination and Dys 2 would be recommended however clear liquids per MD due to retroperitoneal bleed. Recommend pills whole with water and if difficulty controlling 2 consistencies she could do whole in puree. ST will continue and upgrade diet when cleared by MD. She could benefit from speech-language-cognitive assessment.    HPI HPI: Patient is a 66 year old female presented to [facility] ED on 6/2 with AMS found down at home. Admitted and found to be hypoxic and hypotensive, CT chest showing submassive PE, CT abd and UA concerning for UTI w/ possible pyelonephritis. 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 PHMx: HTN, DMT2      SLP Plan  Continue with current plan of care      Recommendations for follow up therapy are one component of a multi-disciplinary discharge planning process, led by the attending physician.  Recommendations may be updated based on patient status, additional functional criteria and insurance authorization.    Recommendations  Diet recommendations: Dysphagia 2  (fine chop);Thin liquid Liquids provided via: Straw;Cup Medication Administration: Whole meds with liquid Supervision: Staff to assist with self feeding;Full supervision/cueing for compensatory strategies Compensations: Slow rate;Small sips/bites Postural Changes and/or Swallow Maneuvers: Seated upright 90 degrees                General recommendations:  (TBD) Oral Care Recommendations: Oral care BID Follow Up Recommendations:  (TBD) Assistance recommended at discharge: Frequent or constant Supervision/Assistance SLP Visit Diagnosis: Dysphagia, unspecified (R13.10) Plan: Continue with current plan of care           Houston Siren  02/03/2022, 11:17 AM

## 2022-02-03 NOTE — Progress Notes (Signed)
Patient ID: Jennifer Pacheco, female   DOB: 1955/12/12, 66 y.o.   MRN: [id number] Patient a little bit more alert this morning blood pressure more stable  Awake alert left hemiparesis stable  Continue supportive care from a cardiopulmonary perspective will defer continued work-up of brain mass until patient stable from PEs, right heart strain, and retroperitoneal hematoma.

## 2022-02-03 NOTE — Progress Notes (Signed)
When bathing the patient it was noted that her hair was matted in the back. This patient states that her hair has been matted for "a week or two" prior to coming to the hospital. Attempted to brush it after applying a shower cap with shampoo, but unable to comb through the hair.   Normajean Baxter, RN

## 2022-02-03 NOTE — Progress Notes (Signed)
[facility] Progress Note Patient Name: Jennifer Pacheco DOB: Jan 10, 1956 MRN: [id number]   Date of Service  02/03/2022  HPI/Events of Note  Patient was started on a Bicarb gtt yesterday for lactic acidosis, and needs follow up labs to determine if the gtt can be discontinues.  eICU Interventions  VBG and serum lactic acid ordered.        Kerry Kass Lunell Robart 02/03/2022, 8:49 PM

## 2022-02-03 NOTE — Assessment & Plan Note (Signed)
Moderate sized pulmonary embolism  No RV strain.  Not on pressors   - Manage medically with caval interruption given recent retroperitoneal bleed.

## 2022-02-03 NOTE — Assessment & Plan Note (Signed)
Ongoing hypotension responsive to fluids yesterday. Required repeated transfusion.  Large L RPH found on CT HB now stable and patient mobilizing with PT  - Hold on anticoagulation indefinitely given high bleeding risk.

## 2022-02-03 NOTE — Assessment & Plan Note (Signed)
Currently on Decadron for vasogenic edema from tumor with related leukocytosis.   - Start steroid taper to avoid myopathy and worsening leukocytosis

## 2022-02-03 NOTE — Progress Notes (Signed)
Patient ID: Jennifer Pacheco, female   DOB: 09-Feb-1956, 66 y.o.   MRN: [id number]    Progress Note from the Palliative Medicine Team at [facility]   Patient Name: Jennifer Pacheco        Date: 02/03/2022 DOB: 07/12/1956  Age: 66 y.o. MRN#: [id number] Attending Physician: Jennifer Brood, MD Primary Care Physician: Jennifer Eth, NP Admit Date: 01/30/2022   Medical records reviewed   66 y.o. female   admitted on 01/30/2022 with  PMH of HTN, DMT2 presents to [facility] ED on 6/2 with AMS.  Patient was found down on floor at home altered covered in feces and urine and someone checked on her today on 6/2.  EMS transported patient to [facility].   Unsure exactly how long she has been down.  Patient noted to be hypoxic and hypotensive. Afebrile. Started on IV fluids for hypotension. Sats improved on [place].    CT chest showing submassive PE with RV/LV ratio 1.2-1.5. Started on heparin. Broad spectrum abx started. CT abd and UA concerning for UTI w/ possible pyelonephritis. CXR unremarkable.    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 NSG consulted recommend MRI and decadron.   Retroperitoneal bleed, requiring transfusion  Recommend for transfer to [facility].   This NP visited patient at the bedside as a follow up to yesterday's initial palliative medicine intervention.  I was able to speak to patient's friend Jennifer Pacheco at bedside today.  He shares patient's story as he knows it, being her friend for 15 years.  He understands that patient has multiple siblings and 2 daughters however they have been estranged for as long as he knows her.  He has become increasingly concerned with her living conditions, and actually Adult Protective Services with [facility] has been involved.  At this point in time Jennifer Pacheco  is not willing to make decisions for this patient regarding  healthcare, hopefully patient will clear cognitively over the next several days and be able to make her own decisions.  I spoke to Jennifer Pacheco/LCSW with [facility] [phone] and he verifies that they have had to visits to her home.  The home was found to be bedbug and roach infested however on first visit she maintained capacity to make her own medical decisions.  [facility] was actually involved in patient's transfer on 01-30-22 when patient was found down at home, covered in feces and urine, disheveled and confused.  Apparently [facility] was attempting to help patient's secure Medicaid however patient did not carry through with needed phone calls and paperwork.   Her Social Security number is [phone]   I shared the above information with the attending team to include Dr. Lynetta Pacheco, and transition of care team via secure chat  Clearly this is going to be a complicated psychosocial situation, decisions and treatment plan will be dependent on outcomes over the next several days  Questions and concerns addressed    I plan to meet with [facility] tomorrow at noon   Jennifer Lessen NP  Palliative Medicine Team Team Phone # [phone] Pager [phone]

## 2022-02-03 NOTE — Progress Notes (Signed)
[facility] Progress Note Patient Name: Jennifer Pacheco DOB: 1956/05/04 MRN: [id number]   Date of Service  02/03/2022  HPI/Events of Note  PH 7.46 on the VBG.  eICU Interventions  Bicarb gtt discontinued.        Kerry Kass [name] 02/03/2022, 10:22 PM

## 2022-02-03 NOTE — Progress Notes (Signed)
Gastroenterology Inpatient Follow-up Note   PATIENT IDENTIFICATION  Jennifer Pacheco is a 66 y.o. female with a pmh significant for hypertension, diabetes, GERD, previous adenomatous colon polyps, diverticulosis.  Patient admitted to the hospital in the setting of submassive PE initiated on anticoagulation and findings of a brain mass concerning for potential malignancy with mass effect leading to encephalopathy and now recent retroperitoneal hemorrhage; GI consulted to evaluate a CT scan that was suggestive of esophagitis though repeat imaging does not show that and for consideration of EGD. Hospital Day: 5  SUBJECTIVE  I have reviewed the patient's chart and labs this morning.  Last night the patient had progressive hypotension that led to further findings of hemoglobin drop on laboratories that subsequently led to repeat cross-sectional imaging that showed she had a retroperitoneal hemorrhage without active extravasation.  She had just undergone a recent IVC filter placement on 6/5.  Brain biopsy or brain evaluation has been on hold for now with plan to repeat MRI though of the CT of the brain yesterday showed some mild improvement in mass effect. The patient is resting comfortably today. She is awake and somewhat alert, though this is the first time that I meet her.  She has some left hemiparesis that is reported by neurosurgery to be stable on their notation. She does not offer additional history today as she is quite tired. Palliative care has been consulted per ICU notation though I do not see a consult in place as of yet.   OBJECTIVE  Scheduled Inpatient Medications:   sodium chloride   Intravenous Once   chlorhexidine  15 mL Mouth Rinse BID   Chlorhexidine Gluconate Cloth  6 each Topical Q0600   dexamethasone (DECADRON) injection  10 mg Intravenous Q6H   docusate sodium  100 mg Oral BID   insulin aspart  0-9 Units Subcutaneous Q4H   mouth rinse  15 mL Mouth Rinse q12n4p    pantoprazole (PROTONIX) IV  40 mg Intravenous Q12H   polyethylene glycol  17 g Oral Daily   Continuous Inpatient Infusions:   sodium chloride Stopped (02/02/22 0929)   levofloxacin (LEVAQUIN) IV Stopped (02/02/22 2136)   norepinephrine (LEVOPHED) Adult infusion Stopped (02/03/22 0113)    sodium bicarbonate (isotonic) infusion in sterile water 75 mL/hr at 02/03/22 1000   PRN Inpatient Medications: acetaminophen, calcium carbonate, haloperidol lactate, iohexol, ondansetron (ZOFRAN) IV   Physical Examination  Temp:  [96.1 F (35.6 C)-98.1 F (36.7 C)] 98.1 F (36.7 C) (06/06 0400) Pulse Rate:  [84-140] 105 (06/06 1000) Resp:  [16-33] 18 (06/06 1000) BP: (68-153)/(46-102) 118/96 (06/06 1000) SpO2:  [87 %-100 %] 97 % (06/06 1000) Temp (24hrs), Avg:97.5 F (36.4 C), Min:96.1 F (35.6 C), Max:98.1 F (36.7 C)  Weight: 69.5 kg GEN: NAD, resting in bed PSYCH: Cooperative EYE: Conjunctivae pale-pink ENT: MMM CV: Mildly tachycardic RESP: No audible wheezing MSK/EXT: Lower extremity edema present SKIN: No jaundice NEURO:  Alert & Oriented x 2, neurological exam held today since she was resting   Review of Data   Laboratory Studies   Recent Labs  Lab 02/03/22 0356  NA 132*  K 4.1  CL 110  CO2 19*  BUN 31*  CREATININE 1.12*  GLUCOSE 149*  CALCIUM 7.4*  MG 2.2   Recent Labs  Lab 02/03/22 0356  AST 16  ALT 24  ALKPHOS 35*    Recent Labs  Lab 02/02/22 1943 02/02/22 2010 02/03/22 0356 02/03/22 0709  WBC 24.2*  --  25.4* 27.4*  HGB  6.9*   < > 11.6* 11.9*  HCT 20.8*   < > 31.9* 33.5*  PLT 241  --  166 175   < > = values in this interval not displayed.   Recent Labs  Lab 01/30/22 1211 02/02/22 2223  INR 1.3* 1.8*   Imaging Studies  CT HEAD WO CONTRAST (5MM)  Result Date: 02/03/2022 CLINICAL DATA:  Follow-up head mass worsening left-sided weakness EXAM: CT HEAD WITHOUT CONTRAST TECHNIQUE: Contiguous axial images were obtained from the base of the skull  through the vertex without intravenous contrast. RADIATION DOSE REDUCTION: This exam was performed according to the departmental dose-optimization program which includes automated exposure control, adjustment of the mA and/or kV according to patient size and/or use of iterative reconstruction technique. COMPARISON:  MRI 01/31/2022, CT brain 01/30/2022 FINDINGS: Brain: Redemonstrated hyperdense mass at the right basal ganglia/anterior temporal region, measures approximately 5 by 4.4 cm and is grossly stable in size by CT. Considerable surrounding right hemispheric edema but there appears to be less mass effect on the right lateral ventricle which appears less compressed. Midline shift also appears slightly decreased, measuring 8 mm compared with 11 mm previously. No interval hemorrhage. Underlying chronic small vessel ischemic change of the white matter. Vascular: No hyperdense vessels.  No unexpected calcification Skull: Normal. Negative for fracture or focal lesion. Sinuses/Orbits: No acute finding. Other: None IMPRESSION: 1. Grossly stable hyperdense mass in the right basal ganglial/anterior temporal region without interval hemorrhage. Considerable edema surrounding the mass but there appears to be less mass effect on the right lateral ventricle and overall decreased midline shift to the left, now measuring 8 mm compared with 11 mm previously. 2. Underlying chronic small vessel ischemic changes of the white matter. Electronically Signed   By: Donavan Foil M.D.   On: 02/03/2022 01:03   IR IVC FILTER PLMT / S&I Burke Keels GUID/MOD SED  Result Date: 02/03/2022 CLINICAL DATA:  sub massive pulmonary emboli, CNS neoplasm, GI bleed a relative contraindication to anticoagulation. Caval filtration requested. EXAM: INFERIOR VENACAVOGRAM LEFT RENAL VENOGRAM IVC FILTER PLACEMENT UNDER FLUOROSCOPY FLUOROSCOPY: Radiation Exposure Index (as provided by the fluoroscopic device): 100 mGy air Kerma TECHNIQUE: Patency of the right IJ  vein was confirmed with ultrasound with image documentation. An appropriate skin site was determined. Skin site was marked, prepped with chlorhexidine, and draped using maximum barrier technique. The region was infiltrated locally with 1% lidocaine. Intravenous versed '1mg'$  were administered as conscious sedation during continuous monitoring of the patient's level of consciousness and physiological / cardiorespiratory status by the radiology RN, with a total moderate sedation time of 20 minutes. Under real-time ultrasound guidance, the right IJ vein was accessed with a 21 gauge micropuncture needle; the needle tip within the vein was confirmed with ultrasound image documentation. The needle was exchanged over a 018 guidewire for a transitional dilator, which allow advancement of the [facility] wire into the IVC. A long 6 French vascular sheath was placed for inferior venacavography. This demonstrated no caval thrombus. Renal vein inflows were evident bilaterally at the level of the T12-L1 interspace. Circumaortic left renal vein was suspected, within inferior moiety inflow at the L3 level. 5 French C2 catheter was advanced through the sheath and used to catheterize the inferior left renal vein for venography demonstrating confluence with the dominant superior left renal vein. A small focus of left retroperitoneal venous extravasation was noted. The [facility] IVC filter was advanced through the sheath and successfully deployed under fluoroscopy at the L4 level of the infrarenal IVC  with legs extending across the iliac venous confluence. Followup cavagram demonstrates stable filter position and no evident complication. The sheath was removed and hemostasis achieved at the site. No immediate complication. IMPRESSION: 1. No evidence of IVC thrombus. 2. Circumaortic left renal vein, an anatomic variant. 3. Technically successful infrarenal IVC filter placement. This is a retrievable model. PLAN: This IVC filter is potentially  retrievable. The patient will be assessed for filter retrieval by Interventional Radiology in approximately 8-12 weeks. Further recommendations regarding filter retrieval, continued surveillance or declaration of device permanence, will be made at that time. Electronically Signed   By: Lucrezia Europe M.D.   On: 02/03/2022 08:28   CT ANGIO GI BLEED  Result Date: 02/03/2022 CLINICAL DATA:  Worsening hypotension and decreasing hemoglobin EXAM: CTA ABDOMEN AND PELVIS WITHOUT AND WITH CONTRAST TECHNIQUE: Multidetector CT imaging of the abdomen and pelvis was performed using the standard protocol during bolus administration of intravenous contrast. Multiplanar reconstructed images and MIPs were obtained and reviewed to evaluate the vascular anatomy. RADIATION DOSE REDUCTION: This exam was performed according to the departmental dose-optimization program which includes automated exposure control, adjustment of the mA and/or kV according to patient size and/or use of iterative reconstruction technique. CONTRAST:  151m OMNIPAQUE IOHEXOL 300 MG/ML  SOLN COMPARISON:  01/30/2022 FINDINGS: VASCULAR Aorta: Atherosclerotic calcifications of the abdominal aorta are noted without aneurysmal dilatation or dissection. Celiac: Patent without evidence of aneurysm, dissection, vasculitis or significant stenosis. SMA: Patent without evidence of aneurysm, dissection, vasculitis or significant stenosis. Renals: Both renal arteries are patent without evidence of aneurysm, dissection, vasculitis, fibromuscular dysplasia or significant stenosis. IMA: Patent without evidence of aneurysm, dissection, vasculitis or significant stenosis. Inflow: Iliac arteries are within normal limits. Veins: IVC filter is noted in place bridging the confluence of the iliac veins due to an accessory left renal vein. Review of the MIP images confirms the above findings. NON-VASCULAR Lower chest: Bilateral pleural effusions are noted with mild bibasilar atelectasis.  Changes of lower lobe pulmonary emboli are noted similar to that seen on prior exams. Hepatobiliary: Single calcified gallstone is noted within the gallbladder. The liver appears within normal limits. Previously seen periportal edema has cleared. Pancreas: Unremarkable. No pancreatic ductal dilatation or surrounding inflammatory changes. Spleen: Normal in size without focal abnormality. Adrenals/Urinary Tract: Adrenal glands are within normal limits. Right kidney demonstrates a normal enhancement pattern with normal excretion. Left kidney also demonstrates a normal enhancement pattern with normal excretion although the kidney is somewhat displaced by retroperitoneal hematoma as described below. Bladder is partially distended with opacified urine but also displaced to the right secondary to the retroperitoneal hematoma. Stomach/Bowel: Mild diverticular change of the colon is noted. No obstructive or inflammatory changes of colon are seen. Small bowel on the left are displaced by the retroperitoneal hematoma. The appendix is not well visualized. Stomach and small bowel are within normal limits. Lymphatic: No sizable lymphadenopathy is noted. Reproductive: Uterus and bilateral adnexa are unremarkable. Other: Minimal free pelvic fluid is noted new from the prior exam this is likely related to the known retroperitoneal hematoma. There is a large retroperitoneal hematoma identified on the left with a hematocrit level within. Some scattered areas of increased attenuation are noted along the left lateral margin which persist on all 3 phases likely representing thrombus within. No active extravasation is noted on this exam. The hematoma measures approximately 16 x 11 cm in greatest dimension. It extends for approximately 22 cm in craniocaudad projection. Musculoskeletal: Degenerative changes of lumbar spine are noted.  Severe degenerative change of the left hip joint is noted as well. No acute bony abnormality is seen.  IMPRESSION: VASCULAR No areas of active extravasation are seen although a large left-sided retroperitoneal hematoma is identified as described with significant mass effect upon the adjacent structures. Hematocrit level is noted within. NON-VASCULAR Diverticulosis without diverticulitis Cholelithiasis without complicating factors. Small effusions and bibasilar atelectasis. Residual changes of prior pulmonary emboli are seen. These results will be called to the ordering clinician or representative by the Radiologist Assistant, and communication documented in the PACS or [employer]. Electronically Signed   By: Inez Catalina M.D.   On: 02/03/2022 01:11    GI Procedures and Studies  No new GI procedures to review   ASSESSMENT  Ms. Lehane is a 66 y.o. female with a pmh significant for hypertension, diabetes, GERD, previous adenomatous colon polyps, diverticulosis.  Patient admitted to the hospital in the setting of submassive PE initiated on anticoagulation and findings of a brain mass concerning for potential malignancy with mass effect leading to encephalopathy and now recent retroperitoneal hemorrhage; GI consulted to evaluate a CT scan that was suggestive of esophagitis though repeat imaging does not show that and for consideration of EGD.  Although unable to obtain a true history from her today, she certainly is dealing with a lot from yesterday's hypotension and RP bleed perspective and persisting issues from the brain mass evaluation.  She is in no way/shape/form ready for an endoscopic evaluation at this time.  Palliative care has been consulted per notation in the ICU note though I do not see any formal recommendations as of yet.  With everything that is going on, I certainly want to hold on pursuing an upper endoscopy until she is more stable from a neurosurgical standpoint, neurology standpoint, medical standpoint.  I am available for the rest of this week and weekend that if the patient gets to  the point where she is clinically stable for an anesthesia assisted endoscopy and our anesthesia colleagues feel comfortable, then we can be available for a diagnostic endoscopy as had been outlined based on her previous symptoms for the last few months (based on the consultation note by my partner Dr. Hilarie Fredrickson) to rule out any other issues that may be occurring though most recent CT scan shows no significant concerning findings of esophagitis or thickened distal esophagus or masslike area.  We agree with continuing PPI at a twice daily basis for now.  The inpatient GI service is going to sign off for now, please reach back out to [place] once she has stabilized and if overall goals of care remain in position to perform an upper endoscopy.   PLAN/RECOMMENDATIONS  Continue Protonix IV twice daily Follow-up palliative care recommendations Diagnostic endoscopy when stable from a  neurosurgical/neurology/medicine standpoint can be considered due to her symptoms that were discussed at time of initial consultation -If a diagnostic endoscopy is pursued, then we will just need to know if anticoagulation is on board or not though I favor maintaining her on anticoagulation, if it is restarted (I understand that it is off currently)   As noted above, we will sign off for now but please reach out to [place] when she has become more stable.  Please page/call with questions or concerns.   Justice Britain, MD [facility] Office # [phone]    LOS: 4 days  Irving Copas  02/03/2022, 10:04 AM

## 2022-02-04 DIAGNOSIS — Z515 Encounter for palliative care: Secondary | ICD-10-CM | POA: Diagnosis not present

## 2022-02-04 DIAGNOSIS — J9601 Acute respiratory failure with hypoxia: Secondary | ICD-10-CM | POA: Diagnosis not present

## 2022-02-04 DIAGNOSIS — I2699 Other pulmonary embolism without acute cor pulmonale: Secondary | ICD-10-CM | POA: Diagnosis not present

## 2022-02-04 DIAGNOSIS — G9389 Other specified disorders of brain: Secondary | ICD-10-CM | POA: Diagnosis not present

## 2022-02-04 DIAGNOSIS — I959 Hypotension, unspecified: Secondary | ICD-10-CM | POA: Diagnosis not present

## 2022-02-04 DIAGNOSIS — K661 Hemoperitoneum: Secondary | ICD-10-CM | POA: Diagnosis not present

## 2022-02-04 LAB — CBC WITH DIFFERENTIAL/PLATELET
Abs Immature Granulocytes: 0.64 10*3/uL — ABNORMAL HIGH (ref 0.00–0.07)
Abs Immature Granulocytes: 0 10*3/uL (ref 0.00–0.07)
Abs Immature Granulocytes: 0.42 10*3/uL — ABNORMAL HIGH (ref 0.00–0.07)
Abs Immature Granulocytes: 0.61 10*3/uL — ABNORMAL HIGH (ref 0.00–0.07)
Basophils Absolute: 0.1 10*3/uL (ref 0.0–0.1)
Basophils Absolute: 0 10*3/uL (ref 0.0–0.1)
Basophils Absolute: 0 10*3/uL (ref 0.0–0.1)
Basophils Absolute: 0 10*3/uL (ref 0.0–0.1)
Basophils Relative: 0 %
Basophils Relative: 0 %
Basophils Relative: 0 %
Basophils Relative: 0 %
Eosinophils Absolute: 0 10*3/uL (ref 0.0–0.5)
Eosinophils Absolute: 0 10*3/uL (ref 0.0–0.5)
Eosinophils Absolute: 0 10*3/uL (ref 0.0–0.5)
Eosinophils Absolute: 0 10*3/uL (ref 0.0–0.5)
Eosinophils Relative: 0 %
Eosinophils Relative: 0 %
Eosinophils Relative: 0 %
Eosinophils Relative: 0 %
HCT: 27.6 % — ABNORMAL LOW (ref 36.0–46.0)
HCT: 28.7 % — ABNORMAL LOW (ref 36.0–46.0)
HCT: 25.8 % — ABNORMAL LOW (ref 36.0–46.0)
HCT: 27.1 % — ABNORMAL LOW (ref 36.0–46.0)
Hemoglobin: 9.5 g/dL — ABNORMAL LOW (ref 12.0–15.0)
Hemoglobin: 9.7 g/dL — ABNORMAL LOW (ref 12.0–15.0)
Hemoglobin: 9.2 g/dL — ABNORMAL LOW (ref 12.0–15.0)
Hemoglobin: 9.1 g/dL — ABNORMAL LOW (ref 12.0–15.0)
Immature Granulocytes: 2 %
Immature Granulocytes: 2 %
Immature Granulocytes: 2 %
Lymphocytes Relative: 4 %
Lymphocytes Relative: 4 %
Lymphocytes Relative: 3 %
Lymphocytes Relative: 5 %
Lymphs Abs: 1.1 10*3/uL (ref 0.7–4.0)
Lymphs Abs: 1 10*3/uL (ref 0.7–4.0)
Lymphs Abs: 0.8 10*3/uL (ref 0.7–4.0)
Lymphs Abs: 1.3 10*3/uL (ref 0.7–4.0)
MCH: 27.1 pg (ref 26.0–34.0)
MCH: 26.6 pg (ref 26.0–34.0)
MCH: 27.7 pg (ref 26.0–34.0)
MCH: 26.7 pg (ref 26.0–34.0)
MCHC: 34.4 g/dL (ref 30.0–36.0)
MCHC: 33.8 g/dL (ref 30.0–36.0)
MCHC: 35.7 g/dL (ref 30.0–36.0)
MCHC: 33.6 g/dL (ref 30.0–36.0)
MCV: 78.6 fL — ABNORMAL LOW (ref 80.0–100.0)
MCV: 78.8 fL — ABNORMAL LOW (ref 80.0–100.0)
MCV: 77.7 fL — ABNORMAL LOW (ref 80.0–100.0)
MCV: 79.5 fL — ABNORMAL LOW (ref 80.0–100.0)
Monocytes Absolute: 1.9 10*3/uL — ABNORMAL HIGH (ref 0.1–1.0)
Monocytes Absolute: 1 10*3/uL (ref 0.1–1.0)
Monocytes Absolute: 1.7 10*3/uL — ABNORMAL HIGH (ref 0.1–1.0)
Monocytes Absolute: 2.3 10*3/uL — ABNORMAL HIGH (ref 0.1–1.0)
Monocytes Relative: 7 %
Monocytes Relative: 4 %
Monocytes Relative: 7 %
Monocytes Relative: 9 %
Neutro Abs: 24.2 10*3/uL — ABNORMAL HIGH (ref 1.7–7.7)
Neutro Abs: 23.9 10*3/uL — ABNORMAL HIGH (ref 1.7–7.7)
Neutro Abs: 21.2 10*3/uL — ABNORMAL HIGH (ref 1.7–7.7)
Neutro Abs: 20.8 10*3/uL — ABNORMAL HIGH (ref 1.7–7.7)
Neutrophils Relative %: 87 %
Neutrophils Relative %: 92 %
Neutrophils Relative %: 88 %
Neutrophils Relative %: 84 %
Platelets: 145 10*3/uL — ABNORMAL LOW (ref 150–400)
Platelets: 153 10*3/uL (ref 150–400)
Platelets: 160 10*3/uL (ref 150–400)
Platelets: 158 10*3/uL (ref 150–400)
RBC: 3.51 MIL/uL — ABNORMAL LOW (ref 3.87–5.11)
RBC: 3.64 MIL/uL — ABNORMAL LOW (ref 3.87–5.11)
RBC: 3.32 MIL/uL — ABNORMAL LOW (ref 3.87–5.11)
RBC: 3.41 MIL/uL — ABNORMAL LOW (ref 3.87–5.11)
RDW: 20.7 % — ABNORMAL HIGH (ref 11.5–15.5)
RDW: 20.7 % — ABNORMAL HIGH (ref 11.5–15.5)
RDW: 20.9 % — ABNORMAL HIGH (ref 11.5–15.5)
RDW: 21.2 % — ABNORMAL HIGH (ref 11.5–15.5)
WBC: 27.8 10*3/uL — ABNORMAL HIGH (ref 4.0–10.5)
WBC: 26 10*3/uL — ABNORMAL HIGH (ref 4.0–10.5)
WBC: 24.1 10*3/uL — ABNORMAL HIGH (ref 4.0–10.5)
WBC: 25.1 10*3/uL — ABNORMAL HIGH (ref 4.0–10.5)
nRBC: 5 % — ABNORMAL HIGH (ref 0.0–0.2)
nRBC: 3.1 % — ABNORMAL HIGH (ref 0.0–0.2)
nRBC: 8 /100 WBC — ABNORMAL HIGH
nRBC: 3 % — ABNORMAL HIGH (ref 0.0–0.2)
nRBC: 2.5 % — ABNORMAL HIGH (ref 0.0–0.2)

## 2022-02-04 LAB — COMPREHENSIVE METABOLIC PANEL
ALT: 26 U/L (ref 0–44)
AST: 21 U/L (ref 15–41)
Albumin: 2.4 g/dL — ABNORMAL LOW (ref 3.5–5.0)
Alkaline Phosphatase: 36 U/L — ABNORMAL LOW (ref 38–126)
Anion gap: 10 (ref 5–15)
BUN: 27 mg/dL — ABNORMAL HIGH (ref 8–23)
CO2: 23 mmol/L (ref 22–32)
Calcium: 7.8 mg/dL — ABNORMAL LOW (ref 8.9–10.3)
Chloride: 97 mmol/L — ABNORMAL LOW (ref 98–111)
Creatinine, Ser: 0.85 mg/dL (ref 0.44–1.00)
GFR, Estimated: >60 mL/min (ref >60)
Glucose, Bld: 149 mg/dL — ABNORMAL HIGH (ref 70–99)
Potassium: 4.1 mmol/L (ref 3.5–5.1)
Sodium: 130 mmol/L — ABNORMAL LOW (ref 135–145)
Total Bilirubin: 0.7 mg/dL (ref 0.3–1.2)
Total Protein: 5 g/dL — ABNORMAL LOW (ref 6.5–8.1)

## 2022-02-04 LAB — GLUCOSE, CAPILLARY
Glucose-Capillary: 179 mg/dL — ABNORMAL HIGH (ref 70–99)
Glucose-Capillary: 149 mg/dL — ABNORMAL HIGH (ref 70–99)
Glucose-Capillary: 167 mg/dL — ABNORMAL HIGH (ref 70–99)
Glucose-Capillary: 161 mg/dL — ABNORMAL HIGH (ref 70–99)
Glucose-Capillary: 155 mg/dL — ABNORMAL HIGH (ref 70–99)

## 2022-02-04 LAB — CULTURE, BLOOD (ROUTINE X 2)
Culture: NO GROWTH
Culture: NO GROWTH
Special Requests: ADEQUATE
Special Requests: ADEQUATE

## 2022-02-04 LAB — PATHOLOGIST SMEAR REVIEW

## 2022-02-04 LAB — MAGNESIUM: Magnesium: 2.1 mg/dL (ref 1.7–2.4)

## 2022-02-04 LAB — BRAIN NATRIURETIC PEPTIDE: B Natriuretic Peptide: 40.6 pg/mL (ref 0.0–100.0)

## 2022-02-04 MED ORDER — SODIUM CHLORIDE 0.9 % IV SOLN: INTRAVENOUS | Status: AC

## 2022-02-04 MED ORDER — INSULIN ASPART 100 UNIT/ML IJ SOLN
0.0000 [IU] | Freq: Three times a day (TID) | INTRAMUSCULAR | Status: DC
  Administered 2022-02-04 (×3): 2 [IU] via SUBCUTANEOUS
  Administered 2022-02-05 (×4): 1 [IU] via SUBCUTANEOUS
  Administered 2022-02-06: 2 [IU] via SUBCUTANEOUS
  Administered 2022-02-06: 1 [IU] via SUBCUTANEOUS
  Administered 2022-02-06 – 2022-02-07 (×3): 2 [IU] via SUBCUTANEOUS
  Administered 2022-02-07: 1 [IU] via SUBCUTANEOUS
  Administered 2022-02-07: 3 [IU] via SUBCUTANEOUS
  Administered 2022-02-08: 2 [IU] via SUBCUTANEOUS
  Administered 2022-02-08: 3 [IU] via SUBCUTANEOUS
  Administered 2022-02-08 – 2022-02-10 (×5): 2 [IU] via SUBCUTANEOUS
  Administered 2022-02-10 (×2): 1 [IU] via SUBCUTANEOUS
  Administered 2022-02-11 (×2): 2 [IU] via SUBCUTANEOUS
  Administered 2022-02-11: 1 [IU] via SUBCUTANEOUS
  Administered 2022-02-11 – 2022-02-12 (×2): 2 [IU] via SUBCUTANEOUS
  Administered 2022-02-12: 1 [IU] via SUBCUTANEOUS
  Administered 2022-02-12: 2 [IU] via SUBCUTANEOUS
  Administered 2022-02-13 – 2022-02-14 (×5): 1 [IU] via SUBCUTANEOUS
  Administered 2022-02-14 (×3): 2 [IU] via SUBCUTANEOUS
  Administered 2022-02-15: 1 [IU] via SUBCUTANEOUS
  Administered 2022-02-15 (×2): 2 [IU] via SUBCUTANEOUS
  Administered 2022-02-16: 1 [IU] via SUBCUTANEOUS
  Administered 2022-02-16 (×2): 2 [IU] via SUBCUTANEOUS
  Administered 2022-02-17: 1 [IU] via SUBCUTANEOUS
  Administered 2022-02-17: 2 [IU] via SUBCUTANEOUS
  Administered 2022-02-17: 1 [IU] via SUBCUTANEOUS
  Administered 2022-02-18: 3 [IU] via SUBCUTANEOUS
  Administered 2022-02-18 – 2022-02-19 (×2): 2 [IU] via SUBCUTANEOUS
  Administered 2022-02-19: 3 [IU] via SUBCUTANEOUS
  Administered 2022-02-19 – 2022-02-20 (×2): 2 [IU] via SUBCUTANEOUS
  Administered 2022-02-20: 1 [IU] via SUBCUTANEOUS
  Administered 2022-02-20 – 2022-02-21 (×2): 2 [IU] via SUBCUTANEOUS
  Administered 2022-02-21: 3 [IU] via SUBCUTANEOUS
  Administered 2022-02-21 – 2022-02-23 (×4): 2 [IU] via SUBCUTANEOUS
  Administered 2022-02-23: 1 [IU] via SUBCUTANEOUS
  Administered 2022-02-23: 2 [IU] via SUBCUTANEOUS
  Administered 2022-02-23: 1 [IU] via SUBCUTANEOUS
  Administered 2022-02-24: 2 [IU] via SUBCUTANEOUS
  Administered 2022-02-24: 3 [IU] via SUBCUTANEOUS
  Administered 2022-02-24: 2 [IU] via SUBCUTANEOUS
  Administered 2022-02-24: 5 [IU] via SUBCUTANEOUS
  Administered 2022-02-25 (×2): 2 [IU] via SUBCUTANEOUS
  Administered 2022-02-25: 3 [IU] via SUBCUTANEOUS
  Administered 2022-02-25: 1 [IU] via SUBCUTANEOUS
  Administered 2022-02-26 (×2): 2 [IU] via SUBCUTANEOUS
  Administered 2022-02-27: 1 [IU] via SUBCUTANEOUS
  Administered 2022-02-27: 3 [IU] via SUBCUTANEOUS
  Administered 2022-02-27 – 2022-03-01 (×7): 2 [IU] via SUBCUTANEOUS
  Administered 2022-03-01: 1 [IU] via SUBCUTANEOUS
  Administered 2022-03-01 – 2022-03-02 (×2): 2 [IU] via SUBCUTANEOUS

## 2022-02-04 MED ORDER — CHLORHEXIDINE GLUCONATE CLOTH 2 % EX PADS
6.0000 | MEDICATED_PAD | Freq: Every day | CUTANEOUS | Status: DC
  Administered 2022-02-05: 6 via TOPICAL

## 2022-02-04 NOTE — Progress Notes (Signed)
Google search of "[employer]" listed a shop in [place], [place].  The shop was able to give me [facility] cell phone number [phone].  Called number and message left for return call to Neuro ICU.        Jennifer Gens, MSN, APRN, NP-C, AGACNP-BC [facility] 02/04/2022, 11:12 AM   Please see [email or web address] for pager details.   From 7A-7P if no response, please call [phone] After hours, please call ELink [phone]

## 2022-02-04 NOTE — Progress Notes (Addendum)
NAME:  Jennifer Pacheco, MRN:  [id number], DOB:  03/05/1956, LOS: 5 ADMISSION DATE:  01/30/2022, CONSULTATION DATE:  6/2 REFERRING MD:  Dr. Regenia Skeeter, CHIEF COMPLAINT:  PE; hypotension   History of Present Illness:  66 year old female with pertinent PMH of HTN, DMT2 presents to [facility] ED on 6/2 with AMS.  Patient was found down on floor at home altered covered in feces and urine and someone checked on her today on 6/2.  EMS transported patient to [facility].  Patient altered but states that she fell on her left buttock area and has been unable to to get up since.  Unsure exactly how long she has been down.  Patient noted to be hypoxic and hypotensive. Afebrile. Started on IV fluids for hypotension. Sats improved on [place]. CT chest showing submassive PE with RV/LV ratio 1.2-1.5. Started on heparin. Broad spectrum abx started. CT abd and UA concerning for UTI w/ possible pyelonephritis. CXR unremarkable. 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 NSG consulted recommend MRI and decadron. Recommend for transfer to [facility]. PCCM consulted for ICU admission.  Pertinent  Medical History  HTN DMT2  Significant Hospital Events: Including procedures, antibiotic start and stop dates in addition to other pertinent events   6/2 Admit to [facility] w/ hypotension and PE; transfer to [facility] 6/3 Tx to [facility], PCU.  On heparin, 1 unit PRBC after transfer to [facility].  On RA.  6/5 Concern for bleeding, Hgb drop from 8.2 to 6.3, PRBC ordered, tx back to ICU. Heparin stopped.  6/6 Hgb drop overnight > PRBC, CT Abd with large RP bleed.  On bicarbonate infusion.  Interim History / Subjective:  Pt denies acute complaints, asking for food  Afebrile  Palliative care meeting noted  Objective   Blood pressure (!) 147/92, pulse 88, temperature 97.6 F (36.4 C), temperature source Oral, resp. rate (!) 23, height  $Remov'5\' 5"'tiSzLs$  (1.651 m), weight 69.5 kg, SpO2 97 %.        Intake/Output Summary (Last 24 hours) at 02/04/2022 0815 Last data filed at 02/04/2022 0600 Gross per 24 hour  Intake 1317.96 ml  Output 1000 ml  Net 317.96 ml   Filed Weights   01/30/22 2053 01/31/22 0355 01/31/22 1100  Weight: 69.5 kg 69.5 kg 69.5 kg    Examination: General: disheveled adult female sitting up in bed in AND HEENT: MM pink/moist, anicteric Neuro: Awake, alert, oriented to self, location ("states I'm in hell" but clarifies the hospital), moves right side spontaneously, no spontaneous movement noted on left CV: s1s2 RRR, no m/r/g PULM: non-labored at rest, lungs bilaterally clear  GI: soft, bsx4 active  Extremities: warm/dry, trace dependent edema  Skin: no rashes or lesions  Resolved Hospital Problem list   Elevated CK   Assessment & Plan:   Submassive Pulmonary Embolus  Shock RV/LV ratio 1.28-1.54.  ECHO with LVEF 60-65%, RVSP normal but note limited study.  Shock likely combination of obstructive w/ septic shock, acute blood loss anemia. Transfused 2 units 6/5.  -hold heparin indefinitely. Has been off since 6/5.  She is high risk for bleeding. 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 Ethanol, acetaminophen, salicylate, UDS all normal.  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 MRI brain 6/3 limited study due to motion, 5.2x5.3 cm mass on right, with mass effect with 22mm right to left shift, vasogenic edema in the right hemisphere. -no  NSGY interventions planned currently  -decadron taper in place -appreciate Palliative Care assistance  -suspect poor prognosis   Acute Respiratory Failure with Hypoxia due to Pulmonary Embolism -wean O2 for sats >90% -pulmonary hygiene - IS, mobilize  -IVC filter in place   Hypokalemia  -monitor, replace electrolytes as indicated   Hyponatremia  -NS at 32ml/hr x24h -follow Na+ trend  -advance diet   Anemia / ABLA  In setting of RP bleed, likely additional chronic element with suspected malignancy  -trend CBC  -transfuse for Hgb <7%  Retroperitoneal Bleed RP bleed of 16x11cm in greatest dimension, extends 22 cm craniocaudad projection. No extravasation.  -follow abd exam  -suspect she has reached a place of physiologic tamponade as Hgb stable  -supportive care   Nausea  -PRN zofran  Hyperglycemia DMT2 Hgb A1c 5.5  -sensitive SSI    Cholelithiasis RUQ [place] with mild pericholecystic fluid, mild gallbladder wall thickening, CBD/liver unremarkable -monitor exam  Distal Esophageal Thickening Concern for neoplasm vs. Esophagitis.   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 His number is [phone].  However, he met with Palliative care on 6/6 and does not feel comfortable making decisions for her.  On further questioning 6/7, she states we can attempt to find  her daughters or brother.  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    Tx to PCU, back to [facility].  Critical care time:     Noe Gens, MSN, APRN, NP-C, AGACNP-BC [facility] 02/04/2022, 8:15 AM   Please see [email or web address] for pager details.   From 7A-7P if no response, please call [phone] After hours, please call ELink [phone]

## 2022-02-04 NOTE — Progress Notes (Signed)
Patient ID: Jennifer Pacheco, female   DOB: 1956/07/04, 66 y.o.   MRN: [id number]    Progress Note from the Palliative Medicine Team at [facility]   Patient Name: Jennifer Pacheco        Date: 02/04/2022 DOB: 08/29/1956  Age: 66 y.o. MRN#: [id number] Attending Physician: Kipp Brood, MD Primary Care Physician: [name], NP Admit Date: 01/30/2022   Medical records reviewed   66 y.o. female   admitted on 01/30/2022 with  PMH of HTN, DMT2 presents to [facility] ED on 6/2 with AMS.  Patient was found down on floor at home altered covered in feces and urine and someone checked on her today on 6/2.  EMS transported patient to [facility].   Unsure exactly how long she has been down.  Patient noted to be hypoxic and hypotensive. Afebrile. Started on IV fluids for hypotension. Sats improved on [place].    CT chest showing submassive PE with RV/LV ratio 1.2-1.5. Started on heparin. Broad spectrum abx started. CT abd and UA concerning for UTI w/ possible pyelonephritis. CXR unremarkable.    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 NSG consulted recommend MRI and decadron.   Retroperitoneal bleed, requiring transfusion  Recommend for transfer to [facility].   This NP visited patient at the bedside as a follow up for palliative medicine needs and emotional support.  Patient is more alert today but confused and without medical decisional capacity.  CCM working on exploration of contacts for this patient.  Patient's brother Cheyenne Adas is hopefully a viable next of kin.  I spoke to Adult Protective Services with [facility] again today.       Corene Cornea Evans/LCSW with [facility] [phone]   APS is available for ongoing support.  Complicated psychosocial situation, decisions and treatment plan will be dependent on outcomes over the next several  days  Discussed with Samuel Germany NP and Almyra Free RN  [facility]   PMT will continue to support holistically  Wadie Lessen NP  Palliative Medicine Team Team Phone # [phone] Pager [phone]

## 2022-02-04 NOTE — Progress Notes (Signed)
Speech Language Pathology Treatment: Dysphagia  Patient Details Name: Jennifer Pacheco MRN: [id number] DOB: 1956/05/02 Today's Date: 02/04/2022 Time: 4010-2725 SLP Time Calculation (min) (ACUTE ONLY): 12 min  Assessment / Plan / Recommendation Clinical Impression  Pt's diet was upgraded from clear liquids to regular per MD. She continues to to demonstrate a mostly cognitive based dysphagia marked by significant prolonged mastication and bolus formation without awareness with solid texture. With likelihood of tumor progression more neuro involvement may be seen. Verbalizing immediately after swallow resulted in a prompt cough and need for cues throughout. Therapist will downgrade consistency to Dys 3 (chopped meats, continue thin, pills with thin and supervision for compensatory strategies.    HPI HPI: Patient is a 66 year old female presented to [facility] ED on 6/2 with AMS found down at home. Admitted and found to be hypoxic and hypotensive, CT chest showing submassive PE, CT abd and UA concerning for UTI w/ possible pyelonephritis. 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 PHMx: HTN, DMT2      SLP Plan  Continue with current plan of care      Recommendations for follow up therapy are one component of a multi-disciplinary discharge planning process, led by the attending physician.  Recommendations may be updated based on patient status, additional functional criteria and insurance authorization.    Recommendations  Diet recommendations: Dysphagia 3 (mechanical soft);Thin liquid Liquids provided via: Straw;Cup Medication Administration: Whole meds with liquid Supervision: Staff to assist with self feeding;Full supervision/cueing for compensatory strategies Compensations:  (no talking when eating) Postural Changes and/or Swallow  Maneuvers: Seated upright 90 degrees                Oral Care Recommendations: Oral care BID Follow Up Recommendations:  (TBD) Assistance recommended at discharge: Frequent or constant Supervision/Assistance SLP Visit Diagnosis: Dysphagia, unspecified (R13.10) Plan: Continue with current plan of care           Houston Siren  02/04/2022, 3:20 PM
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
## 2022-02-04 NOTE — Progress Notes (Signed)
Patient ID: Jennifer Pacheco, female   DOB: 1956/04/03, 66 y.o.   MRN: [id number] No neurosurgical intervention at this time.  Patient has been stabilized medically from PEs and heart strain please reconsult neurosurgery when patient medically cleared for continued work-up.

## 2022-02-04 NOTE — Progress Notes (Signed)
Reached patient's brother, [name], via phone.    Legrand Como relays that the patient has a total of 7 brothers and sisters.  He reports she has "had problems all her life".  Their parents took her to a psychiatrist when she was young and he got the impression that there was nothing really to offer or do at the time to treat her condition. He states she has been largely estranged from family over the years and would often do "strange things".  She had a child and left the child - the child's father had to come home from the service to care for the child.  He saw her at a store and he said hello, she did not recognize him, said hello and walked off. He worked for the patient's husband at some point who was a Chief Strategy Officer and he never saw her over the time he worked for him.  We discussed her medical condition and need for family to be a part of her medical decisions given she is not able.  He states he is going to call the siblings he can reach and will call back.  I offered a group meeting, phone meeting.  Support offered.      Noe Gens, MSN, APRN, NP-C, AGACNP-BC  [facility] 02/04/2022, 3:04 PM   Please see [email or web address] for pager details.   From 7A-7P if no response, please call [phone] After hours, please call ELink [phone]

## 2022-02-05 DIAGNOSIS — R571 Hypovolemic shock: Secondary | ICD-10-CM

## 2022-02-05 DIAGNOSIS — I2609 Other pulmonary embolism with acute cor pulmonale: Secondary | ICD-10-CM | POA: Diagnosis not present

## 2022-02-05 DIAGNOSIS — D62 Acute posthemorrhagic anemia: Secondary | ICD-10-CM | POA: Diagnosis not present

## 2022-02-05 DIAGNOSIS — E878 Other disorders of electrolyte and fluid balance, not elsewhere classified: Secondary | ICD-10-CM

## 2022-02-05 DIAGNOSIS — G9341 Metabolic encephalopathy: Secondary | ICD-10-CM | POA: Diagnosis not present

## 2022-02-05 DIAGNOSIS — E119 Type 2 diabetes mellitus without complications: Secondary | ICD-10-CM

## 2022-02-05 DIAGNOSIS — K2289 Other specified disease of esophagus: Secondary | ICD-10-CM

## 2022-02-05 DIAGNOSIS — R1319 Other dysphagia: Secondary | ICD-10-CM

## 2022-02-05 DIAGNOSIS — R933 Abnormal findings on diagnostic imaging of other parts of digestive tract: Secondary | ICD-10-CM

## 2022-02-05 DIAGNOSIS — R5381 Other malaise: Secondary | ICD-10-CM | POA: Diagnosis present

## 2022-02-05 DIAGNOSIS — K8020 Calculus of gallbladder without cholecystitis without obstruction: Secondary | ICD-10-CM

## 2022-02-05 LAB — RENAL FUNCTION PANEL
Albumin: 2.4 g/dL — ABNORMAL LOW (ref 3.5–5.0)
Anion gap: 7 (ref 5–15)
BUN: 17 mg/dL (ref 8–23)
CO2: 27 mmol/L (ref 22–32)
Calcium: 7.7 mg/dL — ABNORMAL LOW (ref 8.9–10.3)
Chloride: 96 mmol/L — ABNORMAL LOW (ref 98–111)
Creatinine, Ser: 0.71 mg/dL (ref 0.44–1.00)
GFR, Estimated: >60 mL/min (ref >60)
Glucose, Bld: 117 mg/dL — ABNORMAL HIGH (ref 70–99)
Phosphorus: 2.1 mg/dL — ABNORMAL LOW (ref 2.5–4.6)
Potassium: 3.9 mmol/L (ref 3.5–5.1)
Sodium: 130 mmol/L — ABNORMAL LOW (ref 135–145)

## 2022-02-05 LAB — COMPREHENSIVE METABOLIC PANEL
ALT: 26 U/L (ref 0–44)
AST: 19 U/L (ref 15–41)
Albumin: 2.3 g/dL — ABNORMAL LOW (ref 3.5–5.0)
Alkaline Phosphatase: 30 U/L — ABNORMAL LOW (ref 38–126)
Anion gap: 7 (ref 5–15)
BUN: 20 mg/dL (ref 8–23)
CO2: 27 mmol/L (ref 22–32)
Calcium: 7.8 mg/dL — ABNORMAL LOW (ref 8.9–10.3)
Chloride: 96 mmol/L — ABNORMAL LOW (ref 98–111)
Creatinine, Ser: 0.76 mg/dL (ref 0.44–1.00)
GFR, Estimated: >60 mL/min (ref >60)
Glucose, Bld: 147 mg/dL — ABNORMAL HIGH (ref 70–99)
Potassium: 3.7 mmol/L (ref 3.5–5.1)
Sodium: 130 mmol/L — ABNORMAL LOW (ref 135–145)
Total Bilirubin: 0.6 mg/dL (ref 0.3–1.2)
Total Protein: 4.8 g/dL — ABNORMAL LOW (ref 6.5–8.1)

## 2022-02-05 LAB — CBC WITH DIFFERENTIAL/PLATELET
Abs Immature Granulocytes: 0.33 10*3/uL — ABNORMAL HIGH (ref 0.00–0.07)
Abs Immature Granulocytes: 0.23 10*3/uL — ABNORMAL HIGH (ref 0.00–0.07)
Basophils Absolute: 0 10*3/uL (ref 0.0–0.1)
Basophils Absolute: 0 10*3/uL (ref 0.0–0.1)
Basophils Relative: 0 %
Basophils Relative: 0 %
Eosinophils Absolute: 0 10*3/uL (ref 0.0–0.5)
Eosinophils Absolute: 0 10*3/uL (ref 0.0–0.5)
Eosinophils Relative: 0 %
Eosinophils Relative: 0 %
HCT: 23.9 % — ABNORMAL LOW (ref 36.0–46.0)
HCT: 25.6 % — ABNORMAL LOW (ref 36.0–46.0)
Hemoglobin: 8.3 g/dL — ABNORMAL LOW (ref 12.0–15.0)
Hemoglobin: 8.5 g/dL — ABNORMAL LOW (ref 12.0–15.0)
Immature Granulocytes: 2 %
Immature Granulocytes: 1 %
Lymphocytes Relative: 4 %
Lymphocytes Relative: 6 %
Lymphs Abs: 0.8 10*3/uL (ref 0.7–4.0)
Lymphs Abs: 1.2 10*3/uL (ref 0.7–4.0)
MCH: 27.6 pg (ref 26.0–34.0)
MCH: 26.8 pg (ref 26.0–34.0)
MCHC: 34.7 g/dL (ref 30.0–36.0)
MCHC: 33.2 g/dL (ref 30.0–36.0)
MCV: 79.4 fL — ABNORMAL LOW (ref 80.0–100.0)
MCV: 80.8 fL (ref 80.0–100.0)
Monocytes Absolute: 1.6 10*3/uL — ABNORMAL HIGH (ref 0.1–1.0)
Monocytes Absolute: 1.6 10*3/uL — ABNORMAL HIGH (ref 0.1–1.0)
Monocytes Relative: 8 %
Monocytes Relative: 8 %
Neutro Abs: 17.5 10*3/uL — ABNORMAL HIGH (ref 1.7–7.7)
Neutro Abs: 17.6 10*3/uL — ABNORMAL HIGH (ref 1.7–7.7)
Neutrophils Relative %: 86 %
Neutrophils Relative %: 85 %
Platelets: 145 10*3/uL — ABNORMAL LOW (ref 150–400)
Platelets: 154 10*3/uL (ref 150–400)
RBC: 3.01 MIL/uL — ABNORMAL LOW (ref 3.87–5.11)
RBC: 3.17 MIL/uL — ABNORMAL LOW (ref 3.87–5.11)
RDW: 21.3 % — ABNORMAL HIGH (ref 11.5–15.5)
RDW: 21.5 % — ABNORMAL HIGH (ref 11.5–15.5)
WBC: 20.2 10*3/uL — ABNORMAL HIGH (ref 4.0–10.5)
WBC: 20.6 10*3/uL — ABNORMAL HIGH (ref 4.0–10.5)
nRBC: 2.1 % — ABNORMAL HIGH (ref 0.0–0.2)
nRBC: 2 % — ABNORMAL HIGH (ref 0.0–0.2)

## 2022-02-05 LAB — MAGNESIUM
Magnesium: 2 mg/dL (ref 1.7–2.4)
Magnesium: 2 mg/dL (ref 1.7–2.4)

## 2022-02-05 LAB — GLUCOSE, CAPILLARY
Glucose-Capillary: 143 mg/dL — ABNORMAL HIGH (ref 70–99)
Glucose-Capillary: 149 mg/dL — ABNORMAL HIGH (ref 70–99)
Glucose-Capillary: 132 mg/dL — ABNORMAL HIGH (ref 70–99)
Glucose-Capillary: 143 mg/dL — ABNORMAL HIGH (ref 70–99)

## 2022-02-05 LAB — BRAIN NATRIURETIC PEPTIDE: B Natriuretic Peptide: 79.9 pg/mL (ref 0.0–100.0)

## 2022-02-05 MED ORDER — LISINOPRIL 20 MG PO TABS
20.0000 mg | ORAL_TABLET | Freq: Every day | ORAL | Status: DC
  Administered 2022-02-05 – 2022-02-14 (×10): 20 mg via ORAL
  Filled 2022-02-05 (×12): qty 1

## 2022-02-05 MED ORDER — POLYETHYLENE GLYCOL 3350 17 G PO PACK
17.0000 g | PACK | Freq: Two times a day (BID) | ORAL | Status: DC
  Administered 2022-02-05 – 2022-04-01 (×63): 17 g via ORAL
  Filled 2022-02-05 (×87): qty 1

## 2022-02-05 MED ORDER — SENNA 8.6 MG PO TABS
1.0000 | ORAL_TABLET | Freq: Every day | ORAL | Status: DC
  Administered 2022-02-05 – 2022-03-31 (×35): 8.6 mg via ORAL
  Filled 2022-02-05 (×41): qty 1

## 2022-02-05 NOTE — Assessment & Plan Note (Deleted)
Recent Labs    02/03/22 0709 02/03/22 1228 02/03/22 1849 02/04/22 0046 02/04/22 0648 02/04/22 1310 02/04/22 1944 02/05/22 0109 02/05/22 0658 02/06/22 0307  HGB 11.9* 11.5* 11.2* 9.5* 9.7* 9.2* 9.1* 8.3* 8.5* 8.7*  Hgb reached nadir at 8.3 and stable now.  Off IV heparin since 6/5. -Continue monitoring -SCD for VTE prophylaxis

## 2022-02-05 NOTE — Progress Notes (Addendum)
PROGRESS NOTE  Jennifer Pacheco VHQ:[id number] DOB: [birth year]   PCP: [name], NP  Patient is from: Home.  Since she lives alone.  DOA: 01/30/2022 LOS: 6  Chief complaints Chief Complaint  Patient presents with   Hypotension     Brief Narrative / Interim history: 66 year old F with PMH of DM-2 and HTN presented to [facility] ED on 6/2 with AMS after she was found down altered in feces and urine by someone who went to check on her.  She reports difficulty getting up after fall on her left buttock.  Unclear how long she was down.  She was found to be hypoxic and hypotensive.  She was started on IV fluids, vasopressors, supplemental oxygen and broad-spectrum antibiotics.  CT chest showed submassive PE with RV/LV ratio 1.2-1.5.  She was started on heparin. CT abd and UA concerning for UTI w/ possible pyelonephritis. CXR unremarkable. 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 NSG consulted recommend MRI and decadron, and transferred to [facility].    Patient was in ICU since arrival at [facility].  She had ABLA likely due to retroperitoneal bleed.  Heparin was discontinued.  She had IVC filter placed.  She came off vasopressor on 6/5.  She was transferred to [facility] on 6/8.     Subjective: Seen and examined earlier this morning.  No major events overnight of this morning.  She has no complaints other than "a little back pain".  She states she feels better today.  She denies chest pain, dyspnea, GI or UTI symptoms.  She is fairly oriented and able to tell me date and month by looking at the calendar on the wall.  Objective: Vitals:   02/05/22 0400 02/05/22 0800 02/05/22 1140 02/05/22 1200  BP: (!) 141/73 (!) 154/86  (!) 157/78  Pulse: 72 71  72  Resp: '20 20  19  '$ Temp: 98.3 F (36.8 C) 98.1 F (36.7 C) 97.6 F (36.4 C)   TempSrc: Oral  Oral Oral   SpO2: 93% 94%  94%  Weight:      Height:        Examination:  GENERAL: No apparent distress.  Nontoxic. HEENT: MMM.  Vision and hearing grossly intact.  NECK: Supple.  No apparent JVD.  RESP:  No IWOB.  Fair aeration bilaterally. CVS:  RRR. Heart sounds normal.  ABD/GI/GU: BS+. Abd soft, NTND.  MSK/EXT:  Moves extremities. No apparent deformity. No edema.  SKIN: no apparent skin lesion or wound NEURO: Awake, alert and oriented appropriately.  No apparent focal neuro deficit. PSYCH: Calm. Normal affect.   Procedures:  None  Microbiology summarized: UXLKG-40 and influenza PCR nonreactive. Blood cultures negative so far Urine culture with insignificant growth.  Assessment and Plan: * Acute pulmonary embolism with acute cor pulmonale (HCC) Submassive PE with RV to LV ratio of 1.28-1.54.  TTE normal without evidence of right heart strain.  She was in shock on arrival.  She was started on IV heparin, which was later discontinued due to [facility] and retroperitoneal bleed.  She has been off IV heparin since  6/5.  She is currently on room air.  Hypovolemic shock (HCC) Likely from poor p.o. intake and dehydration.  However, she could be in shock from PE and sepsis in the setting of possible UTI/pyelonephritis.  She has been off vasopressors since 6/5.  Now hemodynamically stable.  ABLA likely due to retroperitoneal bleed Recent Labs    02/03/22 0356 02/03/22 0709 02/03/22 1228 02/03/22 1849 02/04/22 0046 02/04/22 0648 02/04/22 1310 02/04/22 1944 02/05/22 0109 02/05/22 0658  HGB 11.6* 11.9* 11.5* 11.2* 9.5* 9.7* 9.2* 9.1* 8.3* 8.5*  Hgb reached nadir at 8.3 and stable now.  Off IV heparin since 6/5. -Continue monitoring -SCD for VTE prophylaxis   Brain mass with vasogenic edema and mass effect CT and MRI brain showed 5.2 x 5.3 cm large right basal ganglia mass with surrounding edema and 11 mm left midline shift and near complete effacement of right lateral  ventricle.  This is probably what led to her fall and altered mental status.  She was started on Decadron.  Neurosurgery consulted. -Continue Decadron taper-orders in. -Palliative medicine has been consulted. -NS signed off and suggested reconsult when medically cleared for continued work-up  Acute metabolic encephalopathy Multifactorial including acute illness, brain mass, shock and possible underlying cognitive issue.  Per PCCM note fluid talk to 1 of patient's brother, seems to have some sort of psychiatric issues since childhood.  Largely estranged from family and does "strange things" and fails to recognize her brother at times.  She is currently awake and oriented to self and place.  Able to tell me time by looking at the calendar on the wall. -Reorientation and delirium precautions  Retroperitoneal hematoma In the setting of anticoagulation for PE. -IV heparin discontinued.  Abnormal CT scan, esophagus Distal esophageal thickening at GE junction suspicious for neoplasm or severe esophagitis.  Esophageal dysphagia Likely in the setting of distal esophageal thickening. -Dysphagia 3 diet per SLP  Physical deconditioning Patient lives alone.  Reportedly estranged from family members.  She was found down on feces and urine before arrival.  It seems APS has been involved.  PCCM was able to Google search and talk to patient's brother.  Palliative medicine consulted for goals of care discussion.  Therapy recommends SNF.  Controlled NIDDM-2 A1c 5.5%. Recent Labs  Lab 02/04/22 1231 02/04/22 1517 02/04/22 2106 02/05/22 0750 02/05/22 1119  GLUCAP 167* 161* 155* 143* 149*  Continue SSI   Acute respiratory failure with hypoxia (HCC) Resolved.  On room air.   Pressure skin injury: POA Pressure Injury 01/30/22 Sacrum Right;Medial Stage 2 -  Partial thickness loss of dermis presenting as a shallow open injury with a red, pink wound bed without slough. (Active)  01/30/22 2053   Location: Sacrum  Location Orientation: Right;Medial  Staging: Stage 2 -  Partial thickness loss of dermis presenting as a shallow open injury with a red, pink wound bed without slough.  Wound Description (Comments):   Present on Admission: Yes  Dressing Type Foam - Lift dressing to assess site every shift 02/05/22 0800     Pressure Injury 01/30/22 Coccyx Lower;Medial Stage 2 -  Partial thickness loss of dermis presenting as a shallow open injury with a red, pink wound bed without slough. (Active)  01/30/22 2053  Location: Coccyx  Location Orientation: Lower;Medial  Staging: Stage 2 -  Partial thickness loss of dermis presenting as a shallow open injury with a red, pink wound bed without slough.  Wound Description (Comments):   Present on Admission: Yes  Dressing Type Foam - Lift dressing to assess site every shift 02/05/22 0800   DVT prophylaxis:  Place and maintain sequential compression device Start: 02/02/22 1450  Code Status: Full code Family Communication: Patient and/or RN. Available if any question.  Level of care: Telemetry Medical Status is: Inpatient Remains  inpatient appropriate because: SNF   Final disposition: SNF Consultants:  Pulmonology admitted patient Neurosurgery  Sch Meds:  Scheduled Meds:  chlorhexidine  15 mL Mouth Rinse BID   Chlorhexidine Gluconate Cloth  6 each Topical Q0600   dexamethasone  6 mg Oral Q12H   Followed by   Derrill Memo ON 02/06/2022] dexamethasone  4 mg Oral Q12H   Followed by   Derrill Memo ON 02/14/2022] dexamethasone  4 mg Oral Daily   docusate sodium  100 mg Oral BID   insulin aspart  0-9 Units Subcutaneous TID AC & HS   lisinopril  20 mg Oral Daily   mouth rinse  15 mL Mouth Rinse q12n4p   pantoprazole (PROTONIX) IV  40 mg Intravenous Q12H   polyethylene glycol  17 g Oral BID   senna  1 tablet Oral QHS   Continuous Infusions:  sodium chloride Stopped (02/02/22 0929)   PRN Meds:.acetaminophen, calcium carbonate, haloperidol lactate,  iohexol, ondansetron (ZOFRAN) IV  Antimicrobials: Anti-infectives (From admission, onward)    Start     Dose/Rate Route Frequency Ordered Stop   02/03/22 1715  levofloxacin (LEVAQUIN) tablet 500 mg  Status:  Discontinued        500 mg Oral Daily 02/03/22 1627 02/05/22 1355   02/02/22 2045  levofloxacin (LEVAQUIN) IVPB 500 mg  Status:  Discontinued       Note to Pharmacy: Adjust as needed - switching to IV since unable to take PO   500 mg 100 mL/hr over 60 Minutes Intravenous Every 24 hours 02/02/22 1959 02/03/22 1627   02/01/22 1400  levofloxacin (LEVAQUIN) tablet 500 mg  Status:  Discontinued        500 mg Oral Daily 02/01/22 0911 02/02/22 1959   01/31/22 1000  vancomycin (VANCOREADY) IVPB 750 mg/150 mL  Status:  Discontinued        750 mg 150 mL/hr over 60 Minutes Intravenous Every 12 hours 01/31/22 0003 01/31/22 0848   01/31/22 0200  vancomycin (VANCOREADY) IVPB 1500 mg/300 mL  Status:  Discontinued        1,500 mg 150 mL/hr over 120 Minutes Intravenous Every 12 hours 01/30/22 1241 01/30/22 2348   01/30/22 2100  aztreonam (AZACTAM) 2 g in sodium chloride 0.9 % 100 mL IVPB  Status:  Discontinued        2 g 200 mL/hr over 30 Minutes Intravenous Every 8 hours 01/30/22 1240 02/01/22 0923   01/30/22 1245  aztreonam (AZACTAM) 2 g in sodium chloride 0.9 % 100 mL IVPB        2 g 200 mL/hr over 30 Minutes Intravenous  Once 01/30/22 1231 01/30/22 1408   01/30/22 1245  metroNIDAZOLE (FLAGYL) IVPB 500 mg        500 mg 100 mL/hr over 60 Minutes Intravenous  Once 01/30/22 1231 01/30/22 1408   01/30/22 1245  vancomycin (VANCOCIN) IVPB 1000 mg/200 mL premix  Status:  Discontinued        1,000 mg 200 mL/hr over 60 Minutes Intravenous  Once 01/30/22 1231 01/30/22 1239   01/30/22 1245  vancomycin (VANCOREADY) IVPB 2000 mg/400 mL        2,000 mg 200 mL/hr over 120 Minutes Intravenous  Once 01/30/22 1239 01/30/22 1607        I have personally reviewed the following labs and  images: CBC: Recent Labs  Lab 02/04/22 0648 02/04/22 1310 02/04/22 1944 02/05/22 0109 02/05/22 0658  WBC 26.0* 24.1* 25.1* 20.2* 20.6*  NEUTROABS 23.9* 21.2* 20.8* 17.5* 17.6*  HGB 9.7* 9.2*  9.1* 8.3* 8.5*  HCT 28.7* 25.8* 27.1* 23.9* 25.6*  MCV 78.8* 77.7* 79.5* 79.4* 80.8  PLT 153 160 158 145* 154   BMP &GFR Recent Labs  Lab 01/31/22 0630 02/01/22 0248 02/02/22 0514 02/02/22 2010 02/02/22 2223 02/03/22 0356 02/03/22 1228 02/04/22 0648 02/05/22 0658  NA 133*   < > 131*   < > 131* 132* 131* 130* 130*  K 4.3   < > 3.7   < > 4.1 4.1 4.1 4.1 3.7  CL 105   < > 102  --  108 110 103 97* 96*  CO2 20*   < > 22  --  15* 19* 20* 23 27  GLUCOSE 135*   < > 223*  --  230* 149* 131* 149* 147*  BUN 23   < > 22  --  30* 31* 35* 27* 20  CREATININE 0.58   < > 0.70  --  1.28* 1.12* 1.07* 0.85 0.76  CALCIUM 8.3*   < > 7.9*  --  7.4* 7.4* 7.7* 7.8* 7.8*  MG 2.1  --  1.7  --   --  2.2  --  2.1 2.0   < > = values in this interval not displayed.   Estimated Creatinine Clearance: 68.6 mL/min (by C-G formula based on SCr of 0.76 mg/dL). Liver & Pancreas: Recent Labs  Lab 01/30/22 2315 02/02/22 0514 02/03/22 0356 02/04/22 0648 02/05/22 0658  AST 59* '21 16 21 19  '$ ALT 37 '28 24 26 26  '$ ALKPHOS 52 35* 35* 36* 30*  BILITOT 0.5 0.5 1.0 0.7 0.6  PROT 6.3* 4.9* 4.5* 5.0* 4.8*  ALBUMIN 2.8* 2.3* 2.1* 2.4* 2.3*   Recent Labs  Lab 01/30/22 1211  LIPASE 37   No results for input(s): "AMMONIA" in the last 168 hours. Diabetic: No results for input(s): "HGBA1C" in the last 72 hours. Recent Labs  Lab 02/04/22 1231 02/04/22 1517 02/04/22 2106 02/05/22 0750 02/05/22 1119  GLUCAP 167* 161* 155* 143* 149*   Cardiac Enzymes: Recent Labs  Lab 01/30/22 1211 01/31/22 0630  CKTOTAL 1,651* 691*   No results for input(s): "PROBNP" in the last 8760 hours. Coagulation Profile: Recent Labs  Lab 01/30/22 1211 02/02/22 2223  INR 1.3* 1.8*   Thyroid Function Tests: No results for input(s):  "TSH", "T4TOTAL", "FREET4", "T3FREE", "THYROIDAB" in the last 72 hours. Lipid Profile: No results for input(s): "CHOL", "HDL", "LDLCALC", "TRIG", "CHOLHDL", "LDLDIRECT" in the last 72 hours. Anemia Panel: No results for input(s): "VITAMINB12", "FOLATE", "FERRITIN", "TIBC", "IRON", "RETICCTPCT" in the last 72 hours. Urine analysis:    Component Value Date/Time   COLORURINE YELLOW 01/30/2022 1720   APPEARANCEUR CLEAR 01/30/2022 1720   LABSPEC 1.044 (H) 01/30/2022 1720   PHURINE 5.0 01/30/2022 1720   GLUCOSEU NEGATIVE 01/30/2022 1720   HGBUR SMALL (A) 01/30/2022 1720   BILIRUBINUR NEGATIVE 01/30/2022 1720   KETONESUR 20 (A) 01/30/2022 1720   PROTEINUR 30 (A) 01/30/2022 1720   NITRITE POSITIVE (A) 01/30/2022 1720   LEUKOCYTESUR SMALL (A) 01/30/2022 1720   Sepsis Labs: Invalid input(s): "PROCALCITONIN", "LACTICIDVEN"  Microbiology: Recent Results (from the past 240 hour(s))  Culture, blood (routine x 2)     Status: None   Collection Time: 01/30/22 11:59 AM   Specimen: Right Antecubital; Blood  Result Value Ref Range Status   Specimen Description   Final    RIGHT ANTECUBITAL BOTTLES DRAWN AEROBIC AND ANAEROBIC   Special Requests Blood Culture adequate volume  Final   Culture   Final  NO GROWTH 5 DAYS Performed at [facility], [address], [place], [place] [address]    Report Status 02/04/2022 FINAL  Final  Culture, blood (routine x 2)     Status: None   Collection Time: 01/30/22 12:38 PM   Specimen: BLOOD LEFT HAND  Result Value Ref Range Status   Specimen Description   Final    BLOOD LEFT HAND BOTTLES DRAWN AEROBIC AND ANAEROBIC   Special Requests Blood Culture adequate volume  Final   Culture   Final    NO GROWTH 5 DAYS Performed at [facility], [address], [place], [place] [address]    Report Status 02/04/2022 FINAL  Final  Resp Panel by RT-PCR (Flu A&B, Covid) Anterior Nasal Swab     Status: None   Collection Time: 01/30/22  1:59 PM   Specimen: Anterior Nasal  Swab  Result Value Ref Range Status   SARS Coronavirus 2 by RT PCR NEGATIVE NEGATIVE Final    Comment: (NOTE) SARS-CoV-2 target nucleic acids are NOT DETECTED.  The SARS-CoV-2 RNA is generally detectable in upper respiratory specimens during the acute phase of infection. The lowest concentration of SARS-CoV-2 viral copies this assay can detect is 138 copies/mL. A negative result does not preclude SARS-Cov-2 infection and should not be used as the sole basis for treatment or other patient management decisions. A negative result may occur with  improper specimen collection/handling, submission of specimen other than nasopharyngeal swab, presence of viral mutation(s) within the areas targeted by this assay, and inadequate number of viral copies(<138 copies/mL). A negative result must be combined with clinical observations, patient history, and epidemiological information. The expected result is Negative.  Fact Sheet for Patients:  EntrepreneurPulse.com.au  Fact Sheet for Healthcare Providers:  IncredibleEmployment.be  This test is no t yet approved or cleared by the [place] FDA and  has been authorized for detection and/or diagnosis of SARS-CoV-2 by FDA under an Emergency Use Authorization (EUA). This EUA will remain  in effect (meaning this test can be used) for the duration of the COVID-19 declaration under Section 564(b)(1) of the Act, 21 U.S.C.section 360bbb-3(b)(1), unless the authorization is terminated  or revoked sooner.       Influenza A by PCR NEGATIVE NEGATIVE Final   Influenza B by PCR NEGATIVE NEGATIVE Final    Comment: (NOTE) The Xpert Xpress SARS-CoV-2/FLU/RSV plus assay is intended as an aid in the diagnosis of influenza from Nasopharyngeal swab specimens and should not be used as a sole basis for treatment. Nasal washings and aspirates are unacceptable for Xpert Xpress SARS-CoV-2/FLU/RSV testing.  Fact Sheet for  Patients: EntrepreneurPulse.com.au  Fact Sheet for Healthcare Providers: IncredibleEmployment.be  This test is not yet approved or cleared by the [place] FDA and has been authorized for detection and/or diagnosis of SARS-CoV-2 by FDA under an Emergency Use Authorization (EUA). This EUA will remain in effect (meaning this test can be used) for the duration of the COVID-19 declaration under Section 564(b)(1) of the Act, 21 U.S.C. section 360bbb-3(b)(1), unless the authorization is terminated or revoked.  Performed at [facility], [address] [place], [place] [address]   Urine Culture     Status: Abnormal   Collection Time: 01/30/22  5:20 PM   Specimen: Urine, Catheterized  Result Value Ref Range Status   Specimen Description   Final    URINE, CATHETERIZED Performed at [facility], [address] [place], [place] [address]    Special Requests   Final    NONE  Performed at [facility], [address] [place], [place] [address]    Culture (A)  Final    <10,000 COLONIES/mL INSIGNIFICANT GROWTH Performed at [facility] [address], [place], [place] [address]    Report Status 02/01/2022 FINAL  Final  MRSA Next Gen by PCR, Nasal     Status: None   Collection Time: 01/30/22  8:35 PM   Specimen: Nasal Mucosa; Nasal Swab  Result Value Ref Range Status   MRSA by PCR Next Gen NOT DETECTED NOT DETECTED Final    Comment: (NOTE) The GeneXpert MRSA Assay (FDA approved for NASAL specimens only), is one component of a comprehensive MRSA colonization surveillance program. It is not intended to diagnose MRSA infection nor to guide or monitor treatment for MRSA infections. Test performance is not FDA approved in patients less than 76 years old. Performed at [facility], [place] [address] [place], [place] [address]     Radiology Studies: No results found.    Lindee Leason T. [employer]  If 7PM-7AM, please contact  night-coverage [email or web address] 02/05/2022, 3:01 PM

## 2022-02-05 NOTE — Assessment & Plan Note (Addendum)
Submassive PE with RV to LV ratio of 1.28-1.54.  TTE normal without evidence of right heart strain.  She was in shock on arrival.  She was started on IV heparin, which was later discontinued due to acute blood loss anemia and retroperitoneal bleed.   06/05 discontinued heparin drip. Patient has a IVC filter in place that was placed on 02/02/22 per IR.    She is currently on room air with no chest pain or dyspnea.

## 2022-02-05 NOTE — Progress Notes (Signed)
This RN attempted to call report to 3W. Receiving nurse unable to take report at this time. Per UC, Charge also unable to take report at this time. Call back name and number left with UC at this time.

## 2022-02-05 NOTE — Assessment & Plan Note (Addendum)
Patient lives alone.  Reportedly estranged from family members.  She was found down on feces and urine before arrival.  It seems APS has been involved.  PCCM was able to Google search and talk to patient's brother.  She has a friend visiting.  Palliative medicine consulted for goals of care discussion.  Therapy recommends SNF.

## 2022-02-05 NOTE — Progress Notes (Signed)
Patient sister, [employer], called this RN for update. Patient able to provide consent on updating sister. Patient asked this RN for phonebook to contact family just prior to phone call. Update regarding patient transfer from ICU provided. [employer] [phone]

## 2022-02-05 NOTE — Plan of Care (Signed)

## 2022-02-05 NOTE — Progress Notes (Signed)
Patient transported to 3W31 by this RN and NT. Patient received.

## 2022-02-05 NOTE — Hospital Course (Addendum)
66 year old F with PMH of DM-2 and HTN presented to [facility] ED on 6/2 with AMS after she was found down altered in feces and urine by someone who went to check on her.  She reports difficulty getting up after fall on her left buttock.  Unclear how long she was down.  She was found to be hypoxic and hypotensive.  She was started on IV fluids, vasopressors, supplemental oxygen and broad-spectrum antibiotics.  CT chest showed submassive PE with RV/LV ratio 1.2-1.5.  She was started on heparin. CT abd and UA concerning for UTI w/ possible pyelonephritis. CXR unremarkable. 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 NSG consulted recommend MRI and decadron, and transfer to [facility].    Patient was in ICU since arrival at [facility].  She had ABLA likely due to retroperitoneal bleed.  Heparin was discontinued.  She had IVC filter placed.  She came off vasopressor on 6/5.  She was transferred to [facility] on 6/8.  She is currently stable on room air.    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  Neurosurgery recommends outpatient follow-up in 2 to 3 months for reevaluation and possible surgical planning.  Therapy evaluation recommending SNF.  APS involved in the case.  Palliative care medicine following.  She is medically stable for discharge at this time. She has a court date set for July 7 at 10 AM, moving forward with guardianship. No new events overnight.   Currently pending placement and safe dispo.

## 2022-02-05 NOTE — Assessment & Plan Note (Addendum)
Likely in the setting of distal esophageal thickening. -Dysphagia 3 diet per SLP  Distal esophageal thickening at GE junction suspicious for neoplasm or severe esophagitis.  Continue with bid pantoprazole.

## 2022-02-05 NOTE — Assessment & Plan Note (Addendum)
CT and MRI brain showed 5.2 x 5.3 cm large right basal ganglia mass with surrounding edema and 11 mm left midline shift and near complete effacement of right lateral ventricle.  This is probably what led to her fall and altered mental status.  She was started on Decadron.  Neurosurgery consulted.  Currently on 4 mg daily of dexamethasone.   Plan to follow up as outpatient with neurology.  Patient has residual left sided hemiparesis, more on her lower than upper extremity.  Pending transfer to SNF.

## 2022-02-05 NOTE — Assessment & Plan Note (Deleted)
Distal esophageal thickening at GE junction suspicious for neoplasm or severe esophagitis.

## 2022-02-05 NOTE — Assessment & Plan Note (Signed)
Resolved.  On room air.

## 2022-02-05 NOTE — TOC Initial Note (Signed)
Transition of Care [facility]) - Initial/Assessment Note    Patient Details  Name: Jennifer Pacheco MRN: [id number] Date of Birth: 1956-01-24  Transition of Care [facility]) CM/SW Contact:    Benard Halsted, LCSW Phone Number: 02/05/2022, 4:02 PM  Clinical Narrative:                 CSW left voicemail for patient's brother Legrand Como [phone]) to discuss discharge plan.   Expected Discharge Plan: Skilled Nursing Facility Barriers to Discharge: Continued Medical Work up, SNF Pending bed offer   Patient Goals and CMS Choice        Expected Discharge Plan and Services Expected Discharge Plan: [place] In-house Referral: Clinical Social Work, Hospice / [place] Acute Care Choice: [place] Living arrangements for the past 2 months: Single Family Home                                      Prior Living Arrangements/Services Living arrangements for the past 2 months: Single Family Home Lives with:: Self Patient language and need for interpreter reviewed:: Yes Do you feel safe going back to the place where you live?: Yes      Need for Family Participation in Patient Care: Yes (Comment) Care giver support system in place?: No (comment)   Criminal Activity/Legal Involvement Pertinent to Current Situation/Hospitalization: No - Comment as needed  Activities of Daily Living Home Assistive Devices/Equipment: None ADL Screening (condition at time of admission) Patient's cognitive ability adequate to safely complete daily activities?: No Is the patient deaf or have difficulty hearing?: No Does the patient have difficulty seeing, even when wearing glasses/contacts?: No Does the patient have difficulty concentrating, remembering, or making decisions?: Yes Patient able to express need for assistance with ADLs?: Yes Does the patient have difficulty dressing or bathing?: Yes Independently performs ADLs?: No Communication: Needs  assistance Is this a change from baseline?: Change from baseline, expected to last >3 days Dressing (OT): Needs assistance Is this a change from baseline?: Change from baseline, expected to last >3 days Grooming: Needs assistance Is this a change from baseline?: Change from baseline, expected to last >3 days Feeding: Needs assistance Is this a change from baseline?: Change from baseline, expected to last >3 days Bathing: Needs assistance Is this a change from baseline?: Change from baseline, expected to last >3 days Toileting: Needs assistance Is this a change from baseline?: Change from baseline, expected to last >3days In/Out Bed: Needs assistance Is this a change from baseline?: Change from baseline, expected to last >3 days Walks in Home: Needs assistance Is this a change from baseline?: Change from baseline, expected to last >3 days Does the patient have difficulty walking or climbing stairs?: Yes Weakness of Legs: Both Weakness of Arms/Hands: None  Permission Sought/Granted Permission sought to share information with : Facility Sport and exercise psychologist, Family Supports Permission granted to share information with : No  Share Information with NAME: [name]     Permission granted to share info w Relationship: Brother  Permission granted to share info w Contact Information: [phone]  Emotional Assessment Appearance:: Appears stated age Attitude/Demeanor/Rapport: Unable to Assess Affect (typically observed): Unable to Assess Orientation: : Fluctuating Orientation (Suspected and/or reported Sundowners), Oriented to Self, Oriented to Place Alcohol / Substance Use: Not Applicable Psych Involvement: No (comment)  Admission diagnosis:  Brain edema ([place]) [G93.6] Shock ([place]) [R57.9] Pulmonary embolism ([place]) [  I26.99] Acute respiratory failure with hypoxia ([place]) [J96.01] Fall, initial encounter [W19.XXXA] Acute pulmonary embolism with acute cor pulmonale, unspecified  pulmonary embolism type ([place]) [I26.09] Patient Active Problem List   Diagnosis Date Noted   ABLA likely due to retroperitoneal bleed 16/06/9603   Acute metabolic encephalopathy 54/05/8118   Electrolyte abnormality 02/05/2022   Controlled NIDDM-2 02/05/2022   Cholelithiasis 02/05/2022   Physical deconditioning 02/05/2022   Retroperitoneal hematoma 02/03/2022   Distal esophageal thickening    Esophageal dysphagia    Abnormal CT scan, esophagus    Pressure injury of skin 01/31/2022   Acute pulmonary embolism with acute cor pulmonale (HCC) 01/30/2022   Acute respiratory failure with hypoxia (HCC)    Brain edema ([place])    Hypovolemic shock ([place])    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 [place] [address] Phone: [phone] Fax: [phone]     Social Determinants of Health (SDOH) Interventions    Readmission Risk Interventions     No data to display

## 2022-02-05 NOTE — Progress Notes (Signed)
Report given to [place], Therapist, sports. Per receiving RN, no further questions at this time. Family made aware of transfer, per patient approval.

## 2022-02-05 NOTE — Progress Notes (Addendum)
Physical Therapy Treatment Patient Details Name: Jennifer Pacheco MRN: [id number] DOB: Jan 10, 1956 Today's Date: 02/05/2022   History of Present Illness Pt is 66 yo female who was found down in her home and taken to [facility]. 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 Pt with retroperitoneal bleed and IVC filter placed 6/5. PMH: DM2, HTN    PT Comments    Pt tolerates treatment well. Pt demonstrates very poor attention during session, easily distracted and requiring multiple cues for most mobility tasks. Attention to commands is made more difficult as the pt is very hard of hearing. Pt demonstrates significant weakness, requiring physical support from PT and UE support throughout standing and brief attempts at ambulation. Pt will benefit from continued acute PT services in an effort to reduce falls risk. PT continues to recommend SNF placement.     02/05/22 1321  PT Visit Information  Last PT Received On 02/05/22  Assistance Needed +2 (safety, lines)  Subjective Data  Patient Stated Goal none stated  Precautions  Precautions Fall  Precaution Comments HOH  Restrictions  Weight Bearing Restrictions No  Pain Assessment  Pain Assessment Faces  Faces Pain Scale 2  Pain Location general  Pain Descriptors / Indicators Guarding  Pain Intervention(s) Monitored during session  Cognition  Arousal/Alertness Awake/alert  Behavior During Therapy Impulsive  Overall Cognitive Status No family/caregiver present to determine baseline cognitive functioning  Area of Impairment Attention;Memory;Following commands;Safety/judgement;Awareness;Problem solving  Current Attention Level Focused  Memory Decreased recall of precautions;Decreased short-term memory  Following Commands Follows one step commands inconsistently  Safety/Judgement Decreased awareness of safety;Decreased awareness of deficits  Awareness Intellectual  Problem Solving Difficulty  sequencing;Requires verbal cues;Requires tactile cues  General Comments pt easily distractable, requires repetitive cueing and increased time to follow commands. refers to past events but states she was unsure if such events were last week or last year.  Bed Mobility  Overal bed mobility Needs Assistance  Bed Mobility Supine to Sit  Supine to sit Min assist  General bed mobility comments heavy cueing and increased time, bed pad and LE assist  Transfers  Overall transfer level Needs assistance  Equipment used None  Transfers Sit to/from Stand;Bed to chair/wheelchair/BSC  Sit to Stand Min assist  Bed to/from chair/wheelchair/BSC transfer type: Step pivot  Step pivot transfers Mod assist;+2 safety/equipment  General transfer comment minA to stand from EOB with HHA, modA for step pivot for HHA and trunk support due to decreased coordination  Ambulation/Gait  Ambulation/Gait assistance Mod assist;+2 safety/equipment  Gait Distance (Feet) 5 Feet  Assistive device None  Gait Pattern/deviations Shuffle;Ataxic;Trunk flexed  General Gait Details difficulty sequencing and decreased coordination, reaching for objects to grab for support, therapist provided posterior support and HHA to ambulate  Gait velocity decreased  Gait velocity interpretation <1.8 ft/sec, indicate of risk for recurrent falls  Balance  Overall balance assessment Needs assistance;History of Falls  Sitting-balance support Bilateral upper extremity supported;Feet supported  Sitting balance-Leahy Scale Poor  Sitting balance - Comments reliant on UE support  Standing balance support Single extremity supported;During functional activity  Standing balance-Leahy Scale Poor  Standing balance comment reliant on therapist support  General Comments  General comments (skin integrity, edema, etc.) VSS on RA  PT - End of Session  Activity Tolerance Patient tolerated treatment well  Patient left in chair;with call bell/phone within  reach;with chair alarm set  Nurse Communication Mobility status   PT - Assessment/Plan  PT Plan Current plan  remains appropriate  PT Visit Diagnosis Unsteadiness on feet (R26.81);Difficulty in walking, not elsewhere classified (R26.2)  PT Frequency (ACUTE ONLY) Min 3X/week  Follow Up Recommendations Skilled nursing-short term rehab (<3 hours/day)  Assistance recommended at discharge Frequent or constant Supervision/Assistance  Patient can return home with the following A lot of help with walking and/or transfers;A little help with bathing/dressing/bathroom;Assistance with cooking/housework;Direct supervision/assist for medications management;Direct supervision/assist for financial management;Assist for transportation;Help with stairs or ramp for entrance  PT equipment Other (comment) (TBD)  AM-PAC PT "6 Clicks" Mobility Outcome Measure (Version 2)  Help needed turning from your back to your side while in a flat bed without using bedrails? 3  Help needed moving from lying on your back to sitting on the side of a flat bed without using bedrails? 3  Help needed moving to and from a bed to a chair (including a wheelchair)? 2  Help needed standing up from a chair using your arms (e.g., wheelchair or bedside chair)? 3  Help needed to walk in hospital room? 1  Help needed climbing 3-5 steps with a railing?  1  6 Click Score 13  Consider Recommendation of Discharge To: CIR/SNF/LTACH  Progressive Mobility  What is the highest level of mobility based on the progressive mobility assessment? Level 3 (Stands with assist) - Balance while standing  and cannot march in place  Activity Transferred from bed to chair  PT Goal Progression  Progress towards PT goals Progressing toward goals  Acute Rehab PT Goals  PT Goal Formulation Patient unable to participate in goal setting  Time For Goal Achievement 02/15/22  Potential to Achieve Goals Good  PT Time Calculation  PT Start Time (ACUTE ONLY) 1246  PT  Stop Time (ACUTE ONLY) 1317  PT Time Calculation (min) (ACUTE ONLY) 31 min  PT General Charges  $$ ACUTE PT VISIT 1 Visit  PT Treatments  $Therapeutic Activity 23-37 mins   Zenaida Niece, PT, DPT Acute Rehabilitation Office [phone]

## 2022-02-05 NOTE — Assessment & Plan Note (Addendum)
Patient is tolerating po well, her capillary glucose has been well controlled.  Plan to discontinue insulin sq and will resume metformin per her home regimen.

## 2022-02-05 NOTE — Assessment & Plan Note (Addendum)
Likely from poor p.o. intake and dehydration.  However, she could be in shock from PE and sepsis in the setting of possible UTI/pyelonephritis.  She has been off vasopressors since 6/5.  Now hemodynamically stable.  Shock has resolved.

## 2022-02-05 NOTE — Assessment & Plan Note (Signed)
In the setting of anticoagulation for PE. -IV heparin discontinued.

## 2022-02-05 NOTE — Assessment & Plan Note (Addendum)
Multifactorial including acute illness, brain mass, shock and possible underlying cognitive issue.  Per PCCM note fluid talk to 1 of patient's brother, seems to have some sort of psychiatric issues since childhood.  Largely estranged from family and does "strange things" and fails to recognize her brother at times.  She is currently awake and oriented to self, place, person, time and some of the situations.  She seems to have impaired hearing which could contribute. -Reorientation and delirium precautions  Patient has not used haloperidol since 06/05. Plan to stop haloperidol for now.

## 2022-02-06 DIAGNOSIS — R778 Other specified abnormalities of plasma proteins: Secondary | ICD-10-CM

## 2022-02-06 DIAGNOSIS — R933 Abnormal findings on diagnostic imaging of other parts of digestive tract: Secondary | ICD-10-CM | POA: Diagnosis not present

## 2022-02-06 DIAGNOSIS — D62 Acute posthemorrhagic anemia: Secondary | ICD-10-CM | POA: Diagnosis not present

## 2022-02-06 DIAGNOSIS — N179 Acute kidney failure, unspecified: Secondary | ICD-10-CM

## 2022-02-06 DIAGNOSIS — E872 Acidosis, unspecified: Secondary | ICD-10-CM

## 2022-02-06 DIAGNOSIS — D72825 Bandemia: Secondary | ICD-10-CM

## 2022-02-06 DIAGNOSIS — E871 Hypo-osmolality and hyponatremia: Secondary | ICD-10-CM

## 2022-02-06 DIAGNOSIS — I2609 Other pulmonary embolism with acute cor pulmonale: Secondary | ICD-10-CM | POA: Diagnosis not present

## 2022-02-06 DIAGNOSIS — G9341 Metabolic encephalopathy: Secondary | ICD-10-CM | POA: Diagnosis not present

## 2022-02-06 LAB — GLUCOSE, CAPILLARY
Glucose-Capillary: 140 mg/dL — ABNORMAL HIGH (ref 70–99)
Glucose-Capillary: 176 mg/dL — ABNORMAL HIGH (ref 70–99)
Glucose-Capillary: 151 mg/dL — ABNORMAL HIGH (ref 70–99)
Glucose-Capillary: 142 mg/dL — ABNORMAL HIGH (ref 70–99)

## 2022-02-06 LAB — CBC
HCT: 25.7 % — ABNORMAL LOW (ref 36.0–46.0)
Hemoglobin: 8.7 g/dL — ABNORMAL LOW (ref 12.0–15.0)
MCH: 27.2 pg (ref 26.0–34.0)
MCHC: 33.9 g/dL (ref 30.0–36.0)
MCV: 80.3 fL (ref 80.0–100.0)
Platelets: 166 10*3/uL (ref 150–400)
RBC: 3.2 MIL/uL — ABNORMAL LOW (ref 3.87–5.11)
RDW: 22 % — ABNORMAL HIGH (ref 11.5–15.5)
WBC: 17.3 10*3/uL — ABNORMAL HIGH (ref 4.0–10.5)
nRBC: 1.4 % — ABNORMAL HIGH (ref 0.0–0.2)

## 2022-02-06 NOTE — Assessment & Plan Note (Addendum)
Likely demargination from steroid.  Improved.  Continue monitoring.

## 2022-02-06 NOTE — Assessment & Plan Note (Addendum)
Hyponatremia and hypokalemia.   Likely prerenal.  Resolved.

## 2022-02-06 NOTE — Assessment & Plan Note (Signed)
Likely demand ischemia in the setting of right heart strain.

## 2022-02-06 NOTE — TOC Progression Note (Signed)
Transition of Care [facility]) - Progression Note    Patient Details  Name: Jennifer Pacheco MRN: [id number] Date of Birth: 1955/09/02  Transition of Care [facility]) CM/SW [place], [place] Phone Number: 02/06/2022, 2:52 PM  Clinical Narrative:   CSW noting per chart review that patient is now documented as being fully oriented. CSW asked MD about patient's capacity, and still unsure about capacity at this time, speech evaluation ordered for cognitive evaluation. CSW to follow for possibility of patient's ability to make her own decisions, vs trying to locate a decision maker for patient. Patient's brother has not returned phone call to discuss SNF recommendation.     Expected Discharge Plan: [place] Barriers to Discharge: Continued Medical Work up, SNF Pending bed offer  Expected Discharge Plan and Services Expected Discharge Plan: [place] In-house Referral: Clinical Social Work, Hospice / [place] Acute Care Choice: [place] Living arrangements for the past 2 months: Single Family Home                                       Social Determinants of Health (SDOH) Interventions    Readmission Risk Interventions     No data to display

## 2022-02-06 NOTE — Assessment & Plan Note (Signed)
Stable  Continue monitoring

## 2022-02-06 NOTE — Progress Notes (Addendum)
Speech Language Pathology Treatment: Dysphagia  Patient Details Name: Jennifer Pacheco MRN: [id number] DOB: November 05, 1955 Today's Date: 02/06/2022 Time: 6979-4801 SLP Time Calculation (min) (ACUTE ONLY): 20 min  Assessment / Plan / Recommendation Clinical Impression  Pt upright in bed with am meal set-up in front of her on table. She reported that she was finished eating, but consented to few POs for SLP observation/differential dx. With true dys 3 meal items, pt with extensive mastication of 2-3 minutes for a single bolus. This presentation is likely impacted by cognitive status in conjunction with absent dentition. She resisted clinician cues for liquid wash to assist with oral prep/clearance. Overt coughing x2 occurred during mastication of solid. She ultimately expectorated almost whole intact pieces of dys 3 solid (peaches). Simulated dys 2 textures using meal tray items assisted in reduction of mastication time significantly and pt utilized liquid wash x2 (x1 instance independently) to achieve oral clearance of solid. Recommend downgrade to dys 2 (ground/minced) diet, continue thin liquids. Pt is very distractible and inconsistent with swallow strategies. She will require full supervision for meals to maximize safety. Will f/u for tolerance.    HPI HPI: Patient is a 66 year old female presented to [facility] ED on 6/2 with AMS found down at home. Admitted and found to be hypoxic and hypotensive, CT chest showing submassive PE, CT abd and UA concerning for UTI w/ possible pyelonephritis. 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 PHMx: HTN, DMT2      SLP Plan  Continue with current plan of care      Recommendations for follow up therapy are one component of a multi-disciplinary discharge planning process, led by the attending physician.   Recommendations may be updated based on patient status, additional functional criteria and insurance authorization.    Recommendations  Diet recommendations: Dysphagia 2 (fine chop);Thin liquid Liquids provided via: Straw;Cup Medication Administration: Whole meds with liquid Supervision: Staff to assist with self feeding;Full supervision/cueing for compensatory strategies Compensations: Slow rate;Small sips/bites;Follow solids with liquid Postural Changes and/or Swallow Maneuvers: Seated upright 90 degrees                Oral Care Recommendations: Oral care BID Follow Up Recommendations: Skilled nursing-short term rehab (<3 hours/day) Assistance recommended at discharge: Frequent or constant Supervision/Assistance SLP Visit Diagnosis: Dysphagia, unspecified (R13.10) Plan: Continue with current plan of care            [place], [place], [place] Office Number: [place]  02/06/2022, 10:10 AM

## 2022-02-06 NOTE — Plan of Care (Signed)
  Problem: Education: Goal: Ability to describe self-care measures that may prevent or decrease complications (Diabetes Survival Skills Education) will improve Outcome: Progressing   Problem: Metabolic: Goal: Ability to maintain appropriate glucose levels will improve Outcome: Progressing   Problem: Education: Goal: Knowledge of General Education information will improve Description: Including pain rating scale, medication(s)/side effects and non-pharmacologic comfort measures Outcome: Progressing

## 2022-02-06 NOTE — Progress Notes (Signed)
PROGRESS NOTE  Jhordyn Hoopingarner TOI:[id number] DOB: June 04, 1956   PCP: [name], NP  Patient is from: Home.  Since she lives alone.  DOA: 01/30/2022 LOS: 7  Chief complaints Chief Complaint  Patient presents with   Hypotension     Brief Narrative / Interim history: 66 year old F with PMH of DM-2 and HTN presented to [facility] ED on 6/2 with AMS after she was found down altered in feces and urine by someone who went to check on her.  She reports difficulty getting up after fall on her left buttock.  Unclear how long she was down.  She was found to be hypoxic and hypotensive.  She was started on IV fluids, vasopressors, supplemental oxygen and broad-spectrum antibiotics.  CT chest showed submassive PE with RV/LV ratio 1.2-1.5.  She was started on heparin. CT abd and UA concerning for UTI w/ possible pyelonephritis. CXR unremarkable. 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 NSG consulted recommend MRI and decadron, and transfer to [facility].    Patient was in ICU since arrival at [facility].  She had ABLA likely due to retroperitoneal bleed.  Heparin was discontinued.  She had IVC filter placed.  She came off vasopressor on 6/5.  She was transferred to [facility] hospitalist service on 6/8.  She is currently stable on room air.  Neurosurgery signed off and suggested reconsult when "patient medically cleared for continued work-up".   Reportedly, APS involved in the case.  Palliative medicine following.     Subjective: Seen and examined earlier this morning.  No major events overnight of this morning.  No complaints.  She denies pain, shortness of breath, vision change, GI or UTI symptoms.  She has a friend visiting at the bedside who tells me that she is estranged from her family.  Objective: Vitals:   02/05/22 2151 02/06/22 0350 02/06/22 0800 02/06/22 1200   BP: (!) 145/83 129/75 127/72 (!) 156/83  Pulse: 74 66 76 73  Resp: 17 (!) '27 16 18  '$ Temp: 97.9 F (36.6 C) 98.4 F (36.9 C)    TempSrc: Oral Oral Oral Oral  SpO2: 97% 99% 100% 100%  Weight:      Height:        Examination:  GENERAL: No apparent distress.  Nontoxic. HEENT: MMM.  Vision grossly intact.  Hard of hearing. NECK: Supple.  No apparent JVD.  RESP:  No IWOB.  Fair aeration bilaterally. CVS:  RRR. Heart sounds normal.  ABD/GI/GU: BS+. Abd soft, NTND.  MSK/EXT:  Moves extremities. No apparent deformity. No edema.  SKIN: no apparent skin lesion or wound NEURO: Awake, alert and oriented appropriately.  No apparent focal neuro deficit. PSYCH: Calm. Normal affect.    Procedures:  None  Microbiology summarized: [id number] and influenza PCR nonreactive. Blood cultures negative so far Urine culture with insignificant growth.  Assessment and Plan: * Acute pulmonary embolism with acute cor pulmonale (HCC) Submassive PE with RV to LV ratio of 1.28-1.54.  TTE normal without evidence of right heart strain.  She was in shock on arrival.  She was started on IV heparin, which was later discontinued due to [facility] and retroperitoneal bleed.  She has been off IV heparin since  6/5.  She is currently on room air.  Hypovolemic shock (HCC) Likely from poor p.o. intake and dehydration.  However, she could be in shock from PE and sepsis  in the setting of possible UTI/pyelonephritis.  She has been off vasopressors since 6/5.  Now hemodynamically stable.  ABLA likely due to retroperitoneal bleed Recent Labs    02/03/22 0709 02/03/22 1228 02/03/22 1849 02/04/22 0046 02/04/22 0648 02/04/22 1310 02/04/22 1944 02/05/22 0109 02/05/22 0658 02/06/22 0307  HGB 11.9* 11.5* 11.2* 9.5* 9.7* 9.2* 9.1* 8.3* 8.5* 8.7*  Hgb reached nadir at 8.3 and stable now.  Off IV heparin since 6/5. 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  This is probably what led to her fall and altered mental status.  She was started on Decadron.  Neurosurgery consulted. -Continue Decadron taper-orders in. -Palliative medicine has been consulted. -NS signed off and suggested reconsult when medically cleared for continued work-up  Acute metabolic encephalopathy Multifactorial including acute illness, brain mass, shock and possible underlying cognitive issue.  Per PCCM note fluid talk to 1 of patient's brother, seems to have some sort of psychiatric issues since childhood.  Largely estranged from family and does "strange things" and fails to recognize her brother at times.  She is currently awake and oriented to self, place, person, time and some of the situations.  She seems to have impaired hearing which could contribute. -Reorientation and delirium precautions  Retroperitoneal hematoma In the setting of anticoagulation for PE. -IV heparin discontinued.  Abnormal CT scan, esophagus Distal esophageal thickening at GE junction suspicious for neoplasm or severe esophagitis.  Esophageal dysphagia Likely in the setting of distal esophageal thickening. -Dysphagia 3 diet per SLP  Bandemia Likely demargination from steroid.  Improved.  Continue monitoring.  Physical deconditioning Patient lives alone.  Reportedly estranged from family members.  She was found down on feces and urine before arrival.  It seems APS has been involved.  PCCM was able to Google search and talk to patient's brother.  She has a friend visiting.  Palliative medicine consulted for goals of care discussion.  Therapy recommends SNF.  Controlled NIDDM-2 A1c 5.5%. Recent Labs  Lab 02/05/22 1119 02/05/22 1843 02/05/22 2312 02/06/22 0620 02/06/22 1248  GLUCAP 149* 132* 143* 140* 176*  Continue  SSI   Acute respiratory failure with hypoxia (HCC) Resolved.  On room air.   Pressure skin injury: POA Pressure Injury 01/30/22 Sacrum Right;Medial Stage 2 -  Partial thickness loss of dermis presenting as a shallow open injury with a red, pink wound bed without slough. (Active)  01/30/22 2053  Location: Sacrum  Location Orientation: Right;Medial  Staging: Stage 2 -  Partial thickness loss of dermis presenting as a shallow open injury with a red, pink wound bed without slough.  Wound Description (Comments):   Present on Admission: Yes  Dressing Type Foam - Lift dressing to assess site every shift 02/06/22 0800     Pressure Injury 01/30/22 Coccyx Lower;Medial Stage 2 -  Partial thickness loss of dermis presenting as a shallow open injury with a red, pink wound bed without slough. (Active)  01/30/22 2053  Location: Coccyx  Location Orientation: Lower;Medial  Staging: Stage 2 -  Partial thickness loss of dermis presenting as a shallow open injury with a red, pink wound bed without slough.  Wound Description (Comments):   Present on Admission: Yes  Dressing Type Foam - Lift dressing to assess site every shift 02/06/22 0800  DVT prophylaxis:  Place and maintain sequential compression device Start: 02/02/22 1450  Code Status: Full code Family Communication: Updated patient's friend at bedside Level of care: Med-Surg Status is: Inpatient Remains inpatient appropriate because: SNF   Final disposition: SNF Consultants:  Pulmonology admitted patient Neurosurgery Palliative medicine  Sch Meds:  Scheduled Meds:  chlorhexidine  15 mL Mouth Rinse BID   Chlorhexidine Gluconate Cloth  6 each Topical Q0600   dexamethasone  4 mg Oral Q12H   Followed by   Derrill Memo ON 02/14/2022] dexamethasone  4 mg Oral Daily   docusate sodium  100 mg Oral BID   insulin aspart  0-9 Units Subcutaneous TID AC & HS   lisinopril  20 mg Oral Daily   mouth rinse  15 mL Mouth Rinse q12n4p   pantoprazole  (PROTONIX) IV  40 mg Intravenous Q12H   polyethylene glycol  17 g Oral BID   senna  1 tablet Oral QHS   Continuous Infusions:  sodium chloride Stopped (02/02/22 0929)   PRN Meds:.acetaminophen, calcium carbonate, haloperidol lactate, iohexol, ondansetron (ZOFRAN) IV  Antimicrobials: Anti-infectives (From admission, onward)    Start     Dose/Rate Route Frequency Ordered Stop   02/03/22 1715  levofloxacin (LEVAQUIN) tablet 500 mg  Status:  Discontinued        500 mg Oral Daily 02/03/22 1627 02/05/22 1355   02/02/22 2045  levofloxacin (LEVAQUIN) IVPB 500 mg  Status:  Discontinued       Note to Pharmacy: Adjust as needed - switching to IV since unable to take PO   500 mg 100 mL/hr over 60 Minutes Intravenous Every 24 hours 02/02/22 1959 02/03/22 1627   02/01/22 1400  levofloxacin (LEVAQUIN) tablet 500 mg  Status:  Discontinued        500 mg Oral Daily 02/01/22 0911 02/02/22 1959   01/31/22 1000  vancomycin (VANCOREADY) IVPB 750 mg/150 mL  Status:  Discontinued        750 mg 150 mL/hr over 60 Minutes Intravenous Every 12 hours 01/31/22 0003 01/31/22 0848   01/31/22 0200  vancomycin (VANCOREADY) IVPB 1500 mg/300 mL  Status:  Discontinued        1,500 mg 150 mL/hr over 120 Minutes Intravenous Every 12 hours 01/30/22 1241 01/30/22 2348   01/30/22 2100  aztreonam (AZACTAM) 2 g in sodium chloride 0.9 % 100 mL IVPB  Status:  Discontinued        2 g 200 mL/hr over 30 Minutes Intravenous Every 8 hours 01/30/22 1240 02/01/22 0923   01/30/22 1245  aztreonam (AZACTAM) 2 g in sodium chloride 0.9 % 100 mL IVPB        2 g 200 mL/hr over 30 Minutes Intravenous  Once 01/30/22 1231 01/30/22 1408   01/30/22 1245  metroNIDAZOLE (FLAGYL) IVPB 500 mg        500 mg 100 mL/hr over 60 Minutes Intravenous  Once 01/30/22 1231 01/30/22 1408   01/30/22 1245  vancomycin (VANCOCIN) IVPB 1000 mg/200 mL premix  Status:  Discontinued        1,000 mg 200 mL/hr over 60 Minutes Intravenous  Once 01/30/22 1231 01/30/22  1239   01/30/22 1245  vancomycin (VANCOREADY) IVPB 2000 mg/400 mL        2,000 mg 200 mL/hr over 120 Minutes Intravenous  Once 01/30/22 1239 01/30/22 1607        I have personally reviewed the following labs and images: CBC: Recent Labs  Lab 02/04/22 0648 02/04/22 1310 02/04/22 1944 02/05/22 0109 02/05/22  4315 02/06/22 0307  WBC 26.0* 24.1* 25.1* 20.2* 20.6* 17.3*  NEUTROABS 23.9* 21.2* 20.8* 17.5* 17.6*  --   HGB 9.7* 9.2* 9.1* 8.3* 8.5* 8.7*  HCT 28.7* 25.8* 27.1* 23.9* 25.6* 25.7*  MCV 78.8* 77.7* 79.5* 79.4* 80.8 80.3  PLT 153 160 158 145* 154 166   BMP &GFR Recent Labs  Lab 02/02/22 0514 02/02/22 2010 02/03/22 0356 02/03/22 1228 02/04/22 0648 02/05/22 0658 02/05/22 1516  NA 131*   < > 132* 131* 130* 130* 130*  K 3.7   < > 4.1 4.1 4.1 3.7 3.9  CL 102   < > 110 103 97* 96* 96*  CO2 22   < > 19* 20* '23 27 27  '$ GLUCOSE 223*   < > 149* 131* 149* 147* 117*  BUN 22   < > 31* 35* 27* 20 17  CREATININE 0.70   < > 1.12* 1.07* 0.85 0.76 0.71  CALCIUM 7.9*   < > 7.4* 7.7* 7.8* 7.8* 7.7*  MG 1.7  --  2.2  --  2.1 2.0 2.0  PHOS  --   --   --   --   --   --  2.1*   < > = values in this interval not displayed.   Estimated Creatinine Clearance: 68.6 mL/min (by C-G formula based on SCr of 0.71 mg/dL). Liver & Pancreas: Recent Labs  Lab 01/30/22 2315 02/02/22 0514 02/03/22 0356 02/04/22 0648 02/05/22 0658 02/05/22 1516  AST 59* '21 16 21 19  '$ --   ALT 37 '28 24 26 26  '$ --   ALKPHOS 52 35* 35* 36* 30*  --   BILITOT 0.5 0.5 1.0 0.7 0.6  --   PROT 6.3* 4.9* 4.5* 5.0* 4.8*  --   ALBUMIN 2.8* 2.3* 2.1* 2.4* 2.3* 2.4*   No results for input(s): "LIPASE", "AMYLASE" in the last 168 hours.  No results for input(s): "AMMONIA" in the last 168 hours. Diabetic: No results for input(s): "HGBA1C" in the last 72 hours. Recent Labs  Lab 02/05/22 1119 02/05/22 1843 02/05/22 2312 02/06/22 0620 02/06/22 1248  GLUCAP 149* 132* 143* 140* 176*   Cardiac Enzymes: Recent Labs   Lab 01/31/22 0630  CKTOTAL 691*   No results for input(s): "PROBNP" in the last 8760 hours. Coagulation Profile: Recent Labs  Lab 02/02/22 2223  INR 1.8*   Thyroid Function Tests: No results for input(s): "TSH", "T4TOTAL", "FREET4", "T3FREE", "THYROIDAB" in the last 72 hours. Lipid Profile: No results for input(s): "CHOL", "HDL", "LDLCALC", "TRIG", "CHOLHDL", "LDLDIRECT" in the last 72 hours. Anemia Panel: No results for input(s): "VITAMINB12", "FOLATE", "FERRITIN", "TIBC", "IRON", "RETICCTPCT" in the last 72 hours. Urine analysis:    Component Value Date/Time   COLORURINE YELLOW 01/30/2022 1720   APPEARANCEUR CLEAR 01/30/2022 1720   LABSPEC 1.044 (H) 01/30/2022 1720   PHURINE 5.0 01/30/2022 1720   GLUCOSEU NEGATIVE 01/30/2022 1720   HGBUR SMALL (A) 01/30/2022 1720   BILIRUBINUR NEGATIVE 01/30/2022 1720   KETONESUR 20 (A) 01/30/2022 1720   PROTEINUR 30 (A) 01/30/2022 1720   NITRITE POSITIVE (A) 01/30/2022 1720   LEUKOCYTESUR SMALL (A) 01/30/2022 1720   Sepsis Labs: Invalid input(s): "PROCALCITONIN", "LACTICIDVEN"  Microbiology: Recent Results (from the past 240 hour(s))  Culture, blood (routine x 2)     Status: None   Collection Time: 01/30/22 11:59 AM   Specimen: Right Antecubital; Blood  Result Value Ref Range Status   Specimen Description   Final    RIGHT ANTECUBITAL BOTTLES DRAWN AEROBIC AND ANAEROBIC  Special Requests Blood Culture adequate volume  Final   Culture   Final    NO GROWTH 5 DAYS Performed at [facility], [address], [place], [place] [address]    Report Status 02/04/2022 FINAL  Final  Culture, blood (routine x 2)     Status: None   Collection Time: 01/30/22 12:38 PM   Specimen: BLOOD LEFT HAND  Result Value Ref Range Status   Specimen Description   Final    BLOOD LEFT HAND BOTTLES DRAWN AEROBIC AND ANAEROBIC   Special Requests Blood Culture adequate volume  Final   Culture   Final    NO GROWTH 5 DAYS Performed at [facility],  [address] [place], [place] [address]    Report Status 02/04/2022 FINAL  Final  Resp Panel by RT-PCR (Flu A&B, Covid) Anterior Nasal Swab     Status: None   Collection Time: 01/30/22  1:59 PM   Specimen: Anterior Nasal Swab  Result Value Ref Range Status   SARS Coronavirus 2 by RT PCR NEGATIVE NEGATIVE Final    Comment: (NOTE) SARS-CoV-2 target nucleic acids are NOT DETECTED.  The SARS-CoV-2 RNA is generally detectable in upper respiratory specimens during the acute phase of infection. The lowest concentration of SARS-CoV-2 viral copies this assay can detect is 138 copies/mL. A negative result does not preclude SARS-Cov-2 infection and should not be used as the sole basis for treatment or other patient management decisions. A negative result may occur with  improper specimen collection/handling, submission of specimen other than nasopharyngeal swab, presence of viral mutation(s) within the areas targeted by this assay, and inadequate number of viral copies(<138 copies/mL). A negative result must be combined with clinical observations, patient history, and epidemiological information. The expected result is Negative.  Fact Sheet for Patients:  EntrepreneurPulse.com.au  Fact Sheet for Healthcare Providers:  IncredibleEmployment.be  This test is no t yet approved or cleared by the [place] FDA and  has been authorized for detection and/or diagnosis of SARS-CoV-2 by FDA under an Emergency Use Authorization (EUA). This EUA will remain  in effect (meaning this test can be used) for the duration of the COVID-19 declaration under Section 564(b)(1) of the Act, 21 U.S.C.section 360bbb-3(b)(1), unless the authorization is terminated  or revoked sooner.       Influenza A by PCR NEGATIVE NEGATIVE Final   Influenza B by PCR NEGATIVE NEGATIVE Final    Comment: (NOTE) The Xpert Xpress SARS-CoV-2/FLU/RSV plus assay is intended as an aid in the diagnosis  of influenza from Nasopharyngeal swab specimens and should not be used as a sole basis for treatment. Nasal washings and aspirates are unacceptable for Xpert Xpress SARS-CoV-2/FLU/RSV testing.  Fact Sheet for Patients: EntrepreneurPulse.com.au  Fact Sheet for Healthcare Providers: IncredibleEmployment.be  This test is not yet approved or cleared by the [place] FDA and has been authorized for detection and/or diagnosis of SARS-CoV-2 by FDA under an Emergency Use Authorization (EUA). This EUA will remain in effect (meaning this test can be used) for the duration of the COVID-19 declaration under Section 564(b)(1) of the Act, 21 U.S.C. section 360bbb-3(b)(1), unless the authorization is terminated or revoked.  Performed at [facility], [address] [place], [place] [address]   Urine Culture     Status: Abnormal   Collection Time: 01/30/22  5:20 PM   Specimen: Urine, Catheterized  Result Value Ref Range Status   Specimen Description   Final    URINE, CATHETERIZED Performed at [facility], [address] [place], [place] [address]    Special Requests   Final    NONE Performed at [facility], [address] [place], [place] [address]    Culture (A)  Final    <10,000 COLONIES/mL INSIGNIFICANT GROWTH Performed at [facility] [address] [place], [place] [address]    Report Status 02/01/2022 FINAL  Final  MRSA Next Gen by PCR, Nasal     Status: None   Collection Time: 01/30/22  8:35 PM   Specimen: Nasal Mucosa; Nasal Swab  Result Value Ref Range Status   MRSA by PCR Next Gen NOT DETECTED NOT DETECTED Final    Comment: (NOTE) The GeneXpert MRSA Assay (FDA approved for NASAL specimens only), is one component of a comprehensive MRSA colonization surveillance program. It is not intended to diagnose MRSA infection nor to guide or monitor treatment for MRSA infections. Test performance is not FDA approved in patients less than  21 years old. Performed at [facility], [place] [address], [place], [place] [address]     Radiology Studies: No results found.    Hajar Penninger T. Woxall  If 7PM-7AM, please contact night-coverage [email or web address] 02/06/2022, 2:23 PM

## 2022-02-06 NOTE — Assessment & Plan Note (Signed)
Resolved

## 2022-02-07 DIAGNOSIS — R933 Abnormal findings on diagnostic imaging of other parts of digestive tract: Secondary | ICD-10-CM | POA: Diagnosis not present

## 2022-02-07 DIAGNOSIS — G9341 Metabolic encephalopathy: Secondary | ICD-10-CM | POA: Diagnosis not present

## 2022-02-07 DIAGNOSIS — D62 Acute posthemorrhagic anemia: Secondary | ICD-10-CM | POA: Diagnosis not present

## 2022-02-07 DIAGNOSIS — I2609 Other pulmonary embolism with acute cor pulmonale: Secondary | ICD-10-CM | POA: Diagnosis not present

## 2022-02-07 LAB — CBC
HCT: 27.4 % — ABNORMAL LOW (ref 36.0–46.0)
Hemoglobin: 9.5 g/dL — ABNORMAL LOW (ref 12.0–15.0)
MCH: 27.5 pg (ref 26.0–34.0)
MCHC: 34.7 g/dL (ref 30.0–36.0)
MCV: 79.2 fL — ABNORMAL LOW (ref 80.0–100.0)
Platelets: 197 10*3/uL (ref 150–400)
RBC: 3.46 MIL/uL — ABNORMAL LOW (ref 3.87–5.11)
RDW: 22.3 % — ABNORMAL HIGH (ref 11.5–15.5)
WBC: 17.1 10*3/uL — ABNORMAL HIGH (ref 4.0–10.5)
nRBC: 0.3 % — ABNORMAL HIGH (ref 0.0–0.2)

## 2022-02-07 LAB — RETICULOCYTES
Immature Retic Fract: 30.3 % — ABNORMAL HIGH (ref 2.3–15.9)
RBC.: 3.45 MIL/uL — ABNORMAL LOW (ref 3.87–5.11)
Retic Count, Absolute: 215.4 10*3/uL — ABNORMAL HIGH (ref 19.0–186.0)
Retic Ct Pct: 6.5 % — ABNORMAL HIGH (ref 0.4–3.1)

## 2022-02-07 LAB — RENAL FUNCTION PANEL
Albumin: 2.5 g/dL — ABNORMAL LOW (ref 3.5–5.0)
Anion gap: 5 (ref 5–15)
BUN: 9 mg/dL (ref 8–23)
CO2: 26 mmol/L (ref 22–32)
Calcium: 7.9 mg/dL — ABNORMAL LOW (ref 8.9–10.3)
Chloride: 99 mmol/L (ref 98–111)
Creatinine, Ser: 0.49 mg/dL (ref 0.44–1.00)
GFR, Estimated: >60 mL/min (ref >60)
Glucose, Bld: 128 mg/dL — ABNORMAL HIGH (ref 70–99)
Phosphorus: 2.7 mg/dL (ref 2.5–4.6)
Potassium: 3.6 mmol/L (ref 3.5–5.1)
Sodium: 130 mmol/L — ABNORMAL LOW (ref 135–145)

## 2022-02-07 LAB — IRON AND TIBC
Iron: 30 ug/dL (ref 28–170)
Saturation Ratios: 8 % — ABNORMAL LOW (ref 10.4–31.8)
TIBC: 371 ug/dL (ref 250–450)
UIBC: 341 ug/dL

## 2022-02-07 LAB — GLUCOSE, CAPILLARY
Glucose-Capillary: 113 mg/dL — ABNORMAL HIGH (ref 70–99)
Glucose-Capillary: 126 mg/dL — ABNORMAL HIGH (ref 70–99)
Glucose-Capillary: 169 mg/dL — ABNORMAL HIGH (ref 70–99)
Glucose-Capillary: 202 mg/dL — ABNORMAL HIGH (ref 70–99)

## 2022-02-07 LAB — FERRITIN: Ferritin: 74 ng/mL (ref 11–307)

## 2022-02-07 LAB — VITAMIN B12: Vitamin B-12: 646 pg/mL (ref 180–914)

## 2022-02-07 LAB — FOLATE: Folate: 15.2 ng/mL (ref >5.9)

## 2022-02-07 LAB — MAGNESIUM: Magnesium: 1.9 mg/dL (ref 1.7–2.4)

## 2022-02-07 MED ORDER — PANTOPRAZOLE SODIUM 40 MG PO TBEC
40.0000 mg | DELAYED_RELEASE_TABLET | Freq: Two times a day (BID) | ORAL | Status: DC
  Administered 2022-02-07 – 2022-04-01 (×107): 40 mg via ORAL
  Filled 2022-02-07 (×107): qty 1

## 2022-02-07 MED ORDER — SODIUM CHLORIDE 0.9 % IV SOLN
250.0000 mg | Freq: Every day | INTRAVENOUS | Status: AC
  Administered 2022-02-07 – 2022-02-08 (×2): 250 mg via INTRAVENOUS
  Filled 2022-02-07 (×2): qty 20

## 2022-02-07 NOTE — Progress Notes (Signed)
PROGRESS NOTE  Jennifer Pacheco OFB:[id number] DOB: 12/09/1955   PCP: [name], NP  Patient is from: Home.  Since she lives alone.  DOA: 01/30/2022 LOS: 8  Chief complaints Chief Complaint  Patient presents with   Hypotension     Brief Narrative / Interim history: 66 year old F with PMH of DM-2 and HTN presented to [facility] ED on 6/2 with AMS after she was found down altered in feces and urine by someone who went to check on her.  She reports difficulty getting up after fall on her left buttock.  Unclear how long she was down.  She was found to be hypoxic and hypotensive.  She was started on IV fluids, vasopressors, supplemental oxygen and broad-spectrum antibiotics.  CT chest showed submassive PE with RV/LV ratio 1.2-1.5.  She was started on heparin. CT abd and UA concerning for UTI w/ possible pyelonephritis. CXR unremarkable. 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 NSG consulted recommend MRI and decadron, and transfer to [facility].    Patient was in ICU since arrival at [facility].  She had ABLA likely due to retroperitoneal bleed.  Heparin was discontinued.  She had IVC filter placed.  She came off vasopressor on 6/5.  She was transferred to [facility] on 6/8.  She is currently stable on room air.  Neurosurgery signed off and suggested reconsult when "patient medically cleared for continued work-up".   Therapy recommended SNF.  Reportedly, APS involved in the case.  Palliative medicine following.     Subjective: Seen and examined earlier this morning.  No major events overnight of this morning.  No complaints other than mild lower back pain.  Objective: Vitals:   02/06/22 2335 02/07/22 0325 02/07/22 0737 02/07/22 1219  BP: (!) 163/98 (!) 148/89 129/87 (!) 166/88  Pulse: 68  72 88  Resp:  '19 16 18  '$ Temp: 97.8 F (36.6 C) 98.1  F (36.7 C) 97.6 F (36.4 C) (!) 97.4 F (36.3 C)  TempSrc: Oral Oral Oral Oral  SpO2: 96% 94% 97% 98%  Weight:  71.1 kg    Height:        Examination:  GENERAL: No apparent distress.  Nontoxic. HEENT: MMM.  Vision and hearing grossly intact.  NECK: Supple.  No apparent JVD.  RESP:  No IWOB.  Fair aeration bilaterally. CVS:  RRR. Heart sounds normal.  ABD/GI/GU: BS+. Abd soft, NTND.  MSK/EXT:  Moves extremities. No apparent deformity. No edema.  SKIN: no apparent skin lesion or wound NEURO: Sleepy but wakes to voice.  Oriented appropriately.  Fair insight.  No apparent focal neuro deficit. PSYCH: Calm. Normal affect.    Procedures:  None  Microbiology summarized: POEUM-35 and influenza PCR nonreactive. Blood cultures negative so far Urine culture with insignificant growth.  Assessment and Plan: * Acute pulmonary embolism with acute cor pulmonale (HCC) Submassive PE with RV to LV ratio of 1.28-1.54.  TTE normal without evidence of right heart strain.  She was in shock on arrival.  She was started on IV heparin, which was later discontinued due to [facility] and retroperitoneal bleed.  She has been off IV heparin since  6/5.  She is currently on room air.  Hypovolemic shock (HCC) Likely from poor p.o. intake and dehydration.  However, she could be in shock from PE and sepsis in the setting of possible UTI/pyelonephritis.  She has  been off vasopressors since 6/5.  Now hemodynamically stable.  ABLA likely due to retroperitoneal bleed: Stable. Recent Labs    02/03/22 1228 02/03/22 1849 02/04/22 0046 02/04/22 0648 02/04/22 1310 02/04/22 1944 02/05/22 0109 02/05/22 0658 02/06/22 0307 02/07/22 0311  HGB 11.5* 11.2* 9.5* 9.7* 9.2* 9.1* 8.3* 8.5* 8.7* 9.5*  Off IV heparin since 6/5. -Continue monitoring -SCD for VTE prophylaxis   Brain mass with vasogenic edema and mass effect CT and MRI brain showed 5.2 x 5.3 cm large right basal ganglia mass with surrounding edema and 11  mm left midline shift and near complete effacement of right lateral ventricle.  This is probably what led to her fall and altered mental status.  She was started on Decadron.  Neurosurgery consulted. -Continue Decadron taper-orders in. -Palliative medicine has been consulted. -NS signed off and suggested reconsult when medically cleared for continued work-up  Acute metabolic encephalopathy: seems to have resolved.  She is oriented x4 with fair insight. -Reorientation and delirium precautions -Minimize sedating medications  Retroperitoneal hematoma: In the setting of anticoagulation for PE.  H&H stable. -IV heparin discontinued.  Distal esophageal thickening: Noted on CT.  At [place] suspicious for neoplasm or severe esophagitis. Esophageal dysphagia GERD -Continue PPI -On dysphagia 3 diet per SLP.  Lactic acidosis: Resolved.  Elevated troponin: Likely demand ischemia in the setting of right heart strain.    Hyponatremia: Stable.  Continue monitoring  AKI (acute kidney injury) ([place]) Likely prerenal.  Resolved.  Bandemia:Likely demargination from steroid.  Improved.  Continue monitoring.  Physical deconditioning Patient lives alone.  Reportedly estranged from family members.  She was found down on feces and urine before arrival.  It seems APS has been involved.  PCCM was able to Google search and talk to patient's brother.  She has a friend visiting.  Palliative medicine consulted for goals of care discussion.  Therapy recommends SNF.  Controlled NIDDM-2: A1c 5.5%.  Not on medication at home. Recent Labs  Lab 02/06/22 1248 02/06/22 1651 02/06/22 2119 02/07/22 0811 02/07/22 1220  GLUCAP 176* 151* 142* 113* 126*  -Continue SSI   Acute respiratory failure with hypoxia (HCC):Resolved.  On room air.    Pressure skin injury: POA Pressure Injury 01/30/22 Sacrum Right;Medial Stage 2 -  Partial thickness loss of dermis presenting as a shallow open injury with a red, pink  wound bed without slough. (Active)  01/30/22 2053  Location: Sacrum  Location Orientation: Right;Medial  Staging: Stage 2 -  Partial thickness loss of dermis presenting as a shallow open injury with a red, pink wound bed without slough.  Wound Description (Comments):   Present on Admission: Yes  Dressing Type Foam - Lift dressing to assess site every shift 02/06/22 2030     Pressure Injury 01/30/22 Coccyx Lower;Medial Stage 2 -  Partial thickness loss of dermis presenting as a shallow open injury with a red, pink wound bed without slough. (Active)  01/30/22 2053  Location: Coccyx  Location Orientation: Lower;Medial  Staging: Stage 2 -  Partial thickness loss of dermis presenting as a shallow open injury with a red, pink wound bed without slough.  Wound Description (Comments):   Present on Admission: Yes  Dressing Type Foam - Lift dressing to assess site every shift 02/06/22 2030   DVT prophylaxis:  Place and maintain sequential compression device Start: 02/02/22 1450  Code Status: Full code Family Communication: None at bedside. Level of care: Med-Surg Status is: Inpatient Remains inpatient appropriate because: SNF   Final disposition: SNF  Consultants:  Pulmonology admitted patient Neurosurgery Palliative medicine  Sch Meds:  Scheduled Meds:  dexamethasone  4 mg Oral Q12H   Followed by   Derrill Memo ON 02/14/2022] dexamethasone  4 mg Oral Daily   docusate sodium  100 mg Oral BID   insulin aspart  0-9 Units Subcutaneous TID AC & HS   lisinopril  20 mg Oral Daily   pantoprazole  40 mg Oral BID   polyethylene glycol  17 g Oral BID   senna  1 tablet Oral QHS   Continuous Infusions:  sodium chloride Stopped (02/02/22 0929)   ferric gluconate (FERRLECIT) IVPB 250 mg (02/07/22 1052)   PRN Meds:.acetaminophen, calcium carbonate, haloperidol lactate, iohexol, ondansetron (ZOFRAN) IV  Antimicrobials: Anti-infectives (From admission, onward)    Start     Dose/Rate Route  Frequency Ordered Stop   02/03/22 1715  levofloxacin (LEVAQUIN) tablet 500 mg  Status:  Discontinued        500 mg Oral Daily 02/03/22 1627 02/05/22 1355   02/02/22 2045  levofloxacin (LEVAQUIN) IVPB 500 mg  Status:  Discontinued       Note to Pharmacy: Adjust as needed - switching to IV since unable to take PO   500 mg 100 mL/hr over 60 Minutes Intravenous Every 24 hours 02/02/22 1959 02/03/22 1627   02/01/22 1400  levofloxacin (LEVAQUIN) tablet 500 mg  Status:  Discontinued        500 mg Oral Daily 02/01/22 0911 02/02/22 1959   01/31/22 1000  vancomycin (VANCOREADY) IVPB 750 mg/150 mL  Status:  Discontinued        750 mg 150 mL/hr over 60 Minutes Intravenous Every 12 hours 01/31/22 0003 01/31/22 0848   01/31/22 0200  vancomycin (VANCOREADY) IVPB 1500 mg/300 mL  Status:  Discontinued        1,500 mg 150 mL/hr over 120 Minutes Intravenous Every 12 hours 01/30/22 1241 01/30/22 2348   01/30/22 2100  aztreonam (AZACTAM) 2 g in sodium chloride 0.9 % 100 mL IVPB  Status:  Discontinued        2 g 200 mL/hr over 30 Minutes Intravenous Every 8 hours 01/30/22 1240 02/01/22 0923   01/30/22 1245  aztreonam (AZACTAM) 2 g in sodium chloride 0.9 % 100 mL IVPB        2 g 200 mL/hr over 30 Minutes Intravenous  Once 01/30/22 1231 01/30/22 1408   01/30/22 1245  metroNIDAZOLE (FLAGYL) IVPB 500 mg        500 mg 100 mL/hr over 60 Minutes Intravenous  Once 01/30/22 1231 01/30/22 1408   01/30/22 1245  vancomycin (VANCOCIN) IVPB 1000 mg/200 mL premix  Status:  Discontinued        1,000 mg 200 mL/hr over 60 Minutes Intravenous  Once 01/30/22 1231 01/30/22 1239   01/30/22 1245  vancomycin (VANCOREADY) IVPB 2000 mg/400 mL        2,000 mg 200 mL/hr over 120 Minutes Intravenous  Once 01/30/22 1239 01/30/22 1607        I have personally reviewed the following labs and images: CBC: Recent Labs  Lab 02/04/22 0648 02/04/22 1310 02/04/22 1944 02/05/22 0109 02/05/22 0658 02/06/22 0307 02/07/22 0311  WBC  26.0* 24.1* 25.1* 20.2* 20.6* 17.3* 17.1*  NEUTROABS 23.9* 21.2* 20.8* 17.5* 17.6*  --   --   HGB 9.7* 9.2* 9.1* 8.3* 8.5* 8.7* 9.5*  HCT 28.7* 25.8* 27.1* 23.9* 25.6* 25.7* 27.4*  MCV 78.8* 77.7* 79.5* 79.4* 80.8 80.3 79.2*  PLT 153 160 158 145* 154 166  197   BMP &GFR Recent Labs  Lab 02/03/22 0356 02/03/22 1228 02/04/22 0648 02/05/22 0658 02/05/22 1516 02/07/22 0311  NA 132* 131* 130* 130* 130* 130*  K 4.1 4.1 4.1 3.7 3.9 3.6  CL 110 103 97* 96* 96* 99  CO2 19* 20* '23 27 27 26  '$ GLUCOSE 149* 131* 149* 147* 117* 128*  BUN 31* 35* 27* '20 17 9  '$ CREATININE 1.12* 1.07* 0.85 0.76 0.71 0.49  CALCIUM 7.4* 7.7* 7.8* 7.8* 7.7* 7.9*  MG 2.2  --  2.1 2.0 2.0 1.9  PHOS  --   --   --   --  2.1* 2.7   Estimated Creatinine Clearance: 69.3 mL/min (by C-G formula based on SCr of 0.49 mg/dL). Liver & Pancreas: Recent Labs  Lab 02/02/22 0514 02/03/22 0356 02/04/22 0648 02/05/22 0658 02/05/22 1516 02/07/22 0311  AST '21 16 21 19  '$ --   --   ALT '28 24 26 26  '$ --   --   ALKPHOS 35* 35* 36* 30*  --   --   BILITOT 0.5 1.0 0.7 0.6  --   --   PROT 4.9* 4.5* 5.0* 4.8*  --   --   ALBUMIN 2.3* 2.1* 2.4* 2.3* 2.4* 2.5*   No results for input(s): "LIPASE", "AMYLASE" in the last 168 hours.  No results for input(s): "AMMONIA" in the last 168 hours. Diabetic: No results for input(s): "HGBA1C" in the last 72 hours. Recent Labs  Lab 02/06/22 1248 02/06/22 1651 02/06/22 2119 02/07/22 0811 02/07/22 1220  GLUCAP 176* 151* 142* 113* 126*   Cardiac Enzymes: No results for input(s): "CKTOTAL", "CKMB", "CKMBINDEX", "TROPONINI" in the last 168 hours.  No results for input(s): "PROBNP" in the last 8760 hours. Coagulation Profile: Recent Labs  Lab 02/02/22 2223  INR 1.8*   Thyroid Function Tests: No results for input(s): "TSH", "T4TOTAL", "FREET4", "T3FREE", "THYROIDAB" in the last 72 hours. Lipid Profile: No results for input(s): "CHOL", "HDL", "LDLCALC", "TRIG", "CHOLHDL", "LDLDIRECT" in the  last 72 hours. Anemia Panel: Recent Labs    02/07/22 0311  VITAMINB12 646  FOLATE 15.2  FERRITIN 74  TIBC 371  IRON 30  RETICCTPCT 6.5*   Urine analysis:    Component Value Date/Time   COLORURINE YELLOW 01/30/2022 1720   APPEARANCEUR CLEAR 01/30/2022 1720   LABSPEC 1.044 (H) 01/30/2022 1720   PHURINE 5.0 01/30/2022 1720   GLUCOSEU NEGATIVE 01/30/2022 1720   HGBUR SMALL (A) 01/30/2022 1720   BILIRUBINUR NEGATIVE 01/30/2022 1720   KETONESUR 20 (A) 01/30/2022 1720   PROTEINUR 30 (A) 01/30/2022 1720   NITRITE POSITIVE (A) 01/30/2022 1720   LEUKOCYTESUR SMALL (A) 01/30/2022 1720   Sepsis Labs: Invalid input(s): "PROCALCITONIN", "LACTICIDVEN"  Microbiology: Recent Results (from the past 240 hour(s))  Culture, blood (routine x 2)     Status: None   Collection Time: 01/30/22 11:59 AM   Specimen: Right Antecubital; Blood  Result Value Ref Range Status   Specimen Description   Final    RIGHT ANTECUBITAL BOTTLES DRAWN AEROBIC AND ANAEROBIC   Special Requests Blood Culture adequate volume  Final   Culture   Final    NO GROWTH 5 DAYS Performed at [facility], [address], [place], [place] [address]    Report Status 02/04/2022 FINAL  Final  Culture, blood (routine x 2)     Status: None   Collection Time: 01/30/22 12:38 PM   Specimen: BLOOD LEFT HAND  Result Value Ref Range Status   Specimen Description  Final    BLOOD LEFT HAND BOTTLES DRAWN AEROBIC AND ANAEROBIC   Special Requests Blood Culture adequate volume  Final   Culture   Final    NO GROWTH 5 DAYS Performed at [facility], [address], [place], [place] [address]    Report Status 02/04/2022 FINAL  Final  Resp Panel by RT-PCR (Flu A&B, Covid) Anterior Nasal Swab     Status: None   Collection Time: 01/30/22  1:59 PM   Specimen: Anterior Nasal Swab  Result Value Ref Range Status   SARS Coronavirus 2 by RT PCR NEGATIVE NEGATIVE Final    Comment: (NOTE) SARS-CoV-2 target nucleic acids are NOT DETECTED.  The  SARS-CoV-2 RNA is generally detectable in upper respiratory specimens during the acute phase of infection. The lowest concentration of SARS-CoV-2 viral copies this assay can detect is 138 copies/mL. A negative result does not preclude SARS-Cov-2 infection and should not be used as the sole basis for treatment or other patient management decisions. A negative result may occur with  improper specimen collection/handling, submission of specimen other than nasopharyngeal swab, presence of viral mutation(s) within the areas targeted by this assay, and inadequate number of viral copies(<138 copies/mL). A negative result must be combined with clinical observations, patient history, and epidemiological information. The expected result is Negative.  Fact Sheet for Patients:  EntrepreneurPulse.com.au  Fact Sheet for Healthcare Providers:  IncredibleEmployment.be  This test is no t yet approved or cleared by the [place] FDA and  has been authorized for detection and/or diagnosis of SARS-CoV-2 by FDA under an Emergency Use Authorization (EUA). This EUA will remain  in effect (meaning this test can be used) for the duration of the COVID-19 declaration under Section 564(b)(1) of the Act, 21 U.S.C.section 360bbb-3(b)(1), unless the authorization is terminated  or revoked sooner.       Influenza A by PCR NEGATIVE NEGATIVE Final   Influenza B by PCR NEGATIVE NEGATIVE Final    Comment: (NOTE) The Xpert Xpress SARS-CoV-2/FLU/RSV plus assay is intended as an aid in the diagnosis of influenza from Nasopharyngeal swab specimens and should not be used as a sole basis for treatment. Nasal washings and aspirates are unacceptable for Xpert Xpress SARS-CoV-2/FLU/RSV testing.  Fact Sheet for Patients: EntrepreneurPulse.com.au  Fact Sheet for Healthcare Providers: IncredibleEmployment.be  This test is not yet approved or  cleared by the [place] FDA and has been authorized for detection and/or diagnosis of SARS-CoV-2 by FDA under an Emergency Use Authorization (EUA). This EUA will remain in effect (meaning this test can be used) for the duration of the COVID-19 declaration under Section 564(b)(1) of the Act, 21 U.S.C. section 360bbb-3(b)(1), unless the authorization is terminated or revoked.  Performed at [facility], [address] [place], [place] [address]   Urine Culture     Status: Abnormal   Collection Time: 01/30/22  5:20 PM   Specimen: Urine, Catheterized  Result Value Ref Range Status   Specimen Description   Final    URINE, CATHETERIZED Performed at [facility], [address], [place], [place] [address]    Special Requests   Final    NONE Performed at [facility], [address], [place], [place] [address]    Culture (A)  Final    <10,000 COLONIES/mL INSIGNIFICANT GROWTH Performed at [facility] [address] [place], [place] [address]    Report Status 02/01/2022 FINAL  Final  MRSA Next Gen by PCR, Nasal     Status: None  Collection Time: 01/30/22  8:35 PM   Specimen: Nasal Mucosa; Nasal Swab  Result Value Ref Range Status   MRSA by PCR Next Gen NOT DETECTED NOT DETECTED Final    Comment: (NOTE) The GeneXpert MRSA Assay (FDA approved for NASAL specimens only), is one component of a comprehensive MRSA colonization surveillance program. It is not intended to diagnose MRSA infection nor to guide or monitor treatment for MRSA infections. Test performance is not FDA approved in patients less than 65 years old. Performed at [facility], [place] [address] [place], [place] [address]     Radiology Studies: No results found.    Ailana Cuadrado T. Carlton  If 7PM-7AM, please contact night-coverage [email or web address] 02/07/2022, 3:07 PM

## 2022-02-08 DIAGNOSIS — K661 Hemoperitoneum: Secondary | ICD-10-CM | POA: Diagnosis not present

## 2022-02-08 DIAGNOSIS — R41841 Cognitive communication deficit: Secondary | ICD-10-CM

## 2022-02-08 DIAGNOSIS — Z515 Encounter for palliative care: Secondary | ICD-10-CM | POA: Diagnosis not present

## 2022-02-08 DIAGNOSIS — R933 Abnormal findings on diagnostic imaging of other parts of digestive tract: Secondary | ICD-10-CM | POA: Diagnosis not present

## 2022-02-08 DIAGNOSIS — I2699 Other pulmonary embolism without acute cor pulmonale: Secondary | ICD-10-CM | POA: Diagnosis not present

## 2022-02-08 DIAGNOSIS — G9341 Metabolic encephalopathy: Secondary | ICD-10-CM | POA: Diagnosis not present

## 2022-02-08 DIAGNOSIS — E872 Acidosis, unspecified: Secondary | ICD-10-CM

## 2022-02-08 DIAGNOSIS — D62 Acute posthemorrhagic anemia: Secondary | ICD-10-CM | POA: Diagnosis not present

## 2022-02-08 DIAGNOSIS — I2609 Other pulmonary embolism with acute cor pulmonale: Secondary | ICD-10-CM | POA: Diagnosis not present

## 2022-02-08 DIAGNOSIS — E871 Hypo-osmolality and hyponatremia: Secondary | ICD-10-CM

## 2022-02-08 DIAGNOSIS — G9389 Other specified disorders of brain: Secondary | ICD-10-CM | POA: Diagnosis not present

## 2022-02-08 DIAGNOSIS — R778 Other specified abnormalities of plasma proteins: Secondary | ICD-10-CM

## 2022-02-08 LAB — GLUCOSE, CAPILLARY
Glucose-Capillary: 160 mg/dL — ABNORMAL HIGH (ref 70–99)
Glucose-Capillary: 227 mg/dL — ABNORMAL HIGH (ref 70–99)
Glucose-Capillary: 164 mg/dL — ABNORMAL HIGH (ref 70–99)
Glucose-Capillary: 164 mg/dL — ABNORMAL HIGH (ref 70–99)

## 2022-02-08 NOTE — Evaluation (Signed)
Speech Language Pathology Evaluation Patient Details Name: Jennifer Pacheco MRN: [id number] DOB: [birth year] Today's Date: 02/08/2022 Time: 0940-1010 SLP Time Calculation (min) (ACUTE ONLY): 30 min  Problem List:  Patient Active Problem List   Diagnosis Date Noted   Bandemia 02/06/2022   AKI (acute kidney injury) ([place]) 02/06/2022   Hyponatremia 02/06/2022   Elevated troponin 02/06/2022   Lactic acidosis 02/06/2022   ABLA likely due to retroperitoneal bleed 17/79/3903   Acute metabolic encephalopathy 00/92/3300   Electrolyte abnormality 02/05/2022   Controlled NIDDM-2 02/05/2022   Cholelithiasis 02/05/2022   Physical deconditioning 02/05/2022   Retroperitoneal hematoma 02/03/2022   Distal esophageal thickening    Esophageal dysphagia    Abnormal CT scan, esophagus    Pressure injury of skin 01/31/2022   Acute pulmonary embolism with acute cor pulmonale ([place]) 01/30/2022   Acute respiratory failure with hypoxia (HCC)    Brain edema (HCC)    Hypovolemic shock (HCC)    Brain mass with vasogenic edema and mass effect    Past Medical History:  Past Medical History:  Diagnosis Date   Fall    Hard of hearing    Past Surgical History:  Past Surgical History:  Procedure Laterality Date   BREAST BIOPSY Left 2020   beg   IR IVC FILTER PLMT / S&I /IMG GUID/MOD SED  02/02/2022   HPI:  Patient is a 66 year old female presented to [facility] ED on 6/2 with AMS found down at home. Admitted and found to be hypoxic and hypotensive, CT chest showing submassive PE, CT abd and UA concerning for UTI w/ possible pyelonephritis. 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 PHMx: HTN, DMT2   Assessment / Plan / Recommendation Clinical Impression  Jennifer Pacheco presents with a moderate cognitive impairment c/b reduced orientation, attention, memory,  processing speeds, and insight. Jennifer Pacheco is disoriented to situation. Attention is very poor, requiring multiple repetitions for participation in testing. Jennifer Pacheco is also hard of hearing, exacerbating communication difficulty. Speech was intermittently mildly dysarthric, but remained intelligible throughout. At this time, Jennifer Pacheco is expected to require full assist for any complex tasks (med mgmt, money mgmt, driving, cooking, etc). Jennifer Pacheco reports that her boyfriend usually does most of these things anyway. See tests results below.  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    COGNISTAT Orientation: 8/12 Attention: 6/8 (functionally much worse) Memory: 6/12 Calculations: 4/8 Judgment: 0/6 Naming: 8/8 Following Directions: 3/12 (potentially impacted by hearing loss)  Medication management informally assessed with patient having 75% acc'y on frequency/dosage questions. Jennifer Pacheco unable to recall her home medications.   SLP Assessment  SLP Recommendation/Assessment: Patient needs continued Speech [place] [facility] SLP Visit Diagnosis: Cognitive communication deficit (R41.841)    Recommendations for follow up therapy are one component of a multi-disciplinary discharge planning process, led by the attending physician.  Recommendations may be updated based on patient status, additional functional criteria and insurance authorization.    Follow Up Recommendations  Follow physician's recommendations for discharge plan and follow up therapies    Assistance Recommended at Discharge   TBD  Functional Status Assessment Patient has had a recent decline in their functional status and/or demonstrates limited ability to make significant improvements in function in a reasonable and predictable amount of time  Frequency and Duration min 1 x/week  2 weeks      SLP Evaluation Cognition  Overall Cognitive Status: Impaired/Different from baseline Orientation Level: Oriented  to time;Oriented to place;Oriented to person;Disoriented to situation Year: 2023 Month: June Day of Week: Correct Memory: Impaired Memory Impairment: Storage deficit;Retrieval deficit;Decreased short term memory Awareness: Impaired Problem Solving: Impaired Problem Solving Impairment: Verbal complex;Functional complex Behaviors: Perseveration;Poor frustration tolerance;Agitated Behavior Scale;Restless Safety/Judgment: Impaired       Oral / Motor  Oral Motor/Sensory Function Overall Oral Motor/Sensory Function: Within functional limits            [place] Anagnos P [name] 02/08/2022, 10:16 AM

## 2022-02-08 NOTE — Plan of Care (Signed)

## 2022-02-08 NOTE — Progress Notes (Signed)
PROGRESS NOTE  Jennifer Pacheco ZHG:[id number] DOB: 1956-07-16   PCP: [name], NP  Patient is from: Home.  Since she lives alone.  DOA: 01/30/2022 LOS: 9  Chief complaints Chief Complaint  Patient presents with   Hypotension     Brief Narrative / Interim history: 66 year old F with PMH of DM-2 and HTN presented to [facility] ED on 6/2 with AMS after she was found down altered in feces and urine by someone who went to check on her.  She reports difficulty getting up after fall on her left buttock.  Unclear how long she was down.  She was found to be hypoxic and hypotensive.  She was started on IV fluids, vasopressors, supplemental oxygen and broad-spectrum antibiotics.  CT chest showed submassive PE with RV/LV ratio 1.2-1.5.  She was started on heparin. CT abd and UA concerning for UTI w/ possible pyelonephritis. CXR unremarkable. 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 NSG consulted recommend MRI and decadron, and transfer to [facility].    Patient was in ICU since arrival at [facility].  She had ABLA likely due to retroperitoneal bleed.  Heparin was discontinued.  She had IVC filter placed.  She came off vasopressor on 6/5.  She was transferred to [facility] hospitalist service on 6/8.  She is currently stable on room air.  Neurosurgery signed off and suggested reconsult when "patient medically cleared for continued work-up".   Therapy recommended SNF.  Reportedly, APS involved in the case.  Palliative medicine following.  Subjective: Seen and examined earlier this morning.  No major events overnight of this morning.  No complaints but very anxious about the brain tumor.   Objective: Vitals:   02/07/22 1956 02/08/22 0000 02/08/22 0322 02/08/22 0742  BP: 128/82 134/83 (!) 141/88 130/85  Pulse: 80 85 79   Resp: '20 16 20   '$ Temp: 98.3 F (36.8 C) 98 F (36.7  C) 98.7 F (37.1 C) 97.6 F (36.4 C)  TempSrc: Oral  Oral Oral  SpO2: 97% 96% 95%   Weight:      Height:        Examination:  GENERAL: No apparent distress.  Nontoxic. HEENT: MMM.  Vision and hearing grossly intact.  NECK: Supple.  No apparent JVD.  RESP:  No IWOB.  Fair aeration bilaterally. CVS:  RRR. Heart sounds normal.  ABD/GI/GU: BS+. Abd soft, NTND.  MSK/EXT:  Moves extremities. No apparent deformity. No edema.  SKIN: no apparent skin lesion or wound NEURO: Awake and alert. Oriented appropriately.  No apparent focal neuro deficit. PSYCH: Calm. Normal affect.   Procedures:  None  Microbiology summarized: [id number] and influenza PCR nonreactive. Blood cultures negative so far Urine culture with insignificant growth.  Assessment and Plan: Brain mass with vasogenic edema and mass effect CT and MRI brain showed 5.2 x 5.3 cm large right basal ganglia mass with surrounding edema and 11 mm left midline shift and near complete effacement of right lateral ventricle.  This is probably what led to her fall and altered mental status.  She was started on Decadron.  Neurosurgery consulted. -Continue Decadron taper-orders in. -Palliative medicine has been consulted. -NS signed off and suggested reconsult when medically cleared for continued work-up  Acute metabolic encephalopathy: seems to have resolved.  She is oriented x4.  Cognitive communication deficit-she had cognitive evaluation by SLP -Reorientation and delirium precautions -Minimize sedating medications -  Continue SLP treatment  Physical deconditioning: lives alone. Found down on feces and urine before arrival.  Reportedly estranged from siblings although she said they will be coming to visit.  It seems APS has been involved.  She has a friend visiting that she identifies as a boyfriend.  Palliative medicine consulted for goals of care discussion.  Therapy recommends SNF.  Acute pulmonary embolism with acute cor pulmonale  (HCC) Submassive PE with RV to LV ratio of 1.28-1.54.  TTE normal without evidence of right heart strain.  She was in shock on arrival.  She was started on IV heparin, which was later discontinued due to [facility] and retroperitoneal bleed.  She has been off IV heparin since  6/5.  She is currently on room air.  Hypovolemic shock ([place]): Likely from poor p.o. intake and dehydration.  However, she could be in shock from PE and sepsis in the setting of possible UTI/pyelonephritis.  She has been off vasopressors since 6/5.  Now hemodynamically stable.  ABLA likely due to retroperitoneal bleed: Stable. Retroperitoneal hematoma: Due to [facility] for PE.  Heparin discontinued after IVC filter on 6/5.  Recent Labs    02/03/22 1228 02/03/22 1849 02/04/22 0046 02/04/22 0648 02/04/22 1310 02/04/22 1944 02/05/22 0109 02/05/22 0658 02/06/22 0307 02/07/22 0311  HGB 11.5* 11.2* 9.5* 9.7* 9.2* 9.1* 8.3* 8.5* 8.7* 9.5*  -Continue monitoring -SCD for VTE prophylaxis  Controlled NIDDM-2: A1c 5.5%.  Not on medication at home. Recent Labs  Lab 02/07/22 0811 02/07/22 1220 02/07/22 1705 02/07/22 2230 02/08/22 0702  GLUCAP 113* 126* 169* 202* 160*  -Continue SSI  Distal esophageal thickening: noted on CT.  At [place] junction suspicious for neoplasm or severe esophagitis. Esophageal dysphagia GERD -Continue PPI -On dysphagia 3 diet per SLP.  Hyponatremia: Stable.  Continue monitoring  Lactic acidosis: Resolved.  Elevated troponin: Likely demand ischemia in the setting of right heart strain.    AKI: Likely prerenal.  Resolved.  Bandemia:Likely demargination from steroid.  Improved.  Continue monitoring.  Acute respiratory failure with hypoxia:Resolved.  On room air.   Pressure skin injury: POA Pressure Injury 01/30/22 Sacrum Right;Medial Stage 2 -  Partial thickness loss of dermis presenting as a shallow open injury with a red, pink wound bed without slough. (Active)  01/30/22 2053  Location: Sacrum   Location Orientation: Right;Medial  Staging: Stage 2 -  Partial thickness loss of dermis presenting as a shallow open injury with a red, pink wound bed without slough.  Wound Description (Comments):   Present on Admission: Yes  Dressing Type Foam - Lift dressing to assess site every shift 02/07/22 2045     Pressure Injury 01/30/22 Coccyx Lower;Medial Stage 2 -  Partial thickness loss of dermis presenting as a shallow open injury with a red, pink wound bed without slough. (Active)  01/30/22 2053  Location: Coccyx  Location Orientation: Lower;Medial  Staging: Stage 2 -  Partial thickness loss of dermis presenting as a shallow open injury with a red, pink wound bed without slough.  Wound Description (Comments):   Present on Admission: Yes  Dressing Type Foam - Lift dressing to assess site every shift 02/07/22 2045   DVT prophylaxis:  Place and maintain sequential compression device Start: 02/02/22 1450  Code Status: Full code Family Communication: None at bedside. Level of care: Med-Surg Status is: Inpatient Remains inpatient appropriate because: SNF   Final disposition: SNF Consultants:  Pulmonology admitted patient Neurosurgery Palliative medicine  Sch Meds:  Scheduled Meds:  dexamethasone  4 mg  Oral Q12H   Followed by   Derrill Memo ON 02/14/2022] dexamethasone  4 mg Oral Daily   docusate sodium  100 mg Oral BID   insulin aspart  0-9 Units Subcutaneous TID AC & HS   lisinopril  20 mg Oral Daily   pantoprazole  40 mg Oral BID   polyethylene glycol  17 g Oral BID   senna  1 tablet Oral QHS   Continuous Infusions:  sodium chloride Stopped (02/02/22 0929)   ferric gluconate (FERRLECIT) IVPB 250 mg (02/08/22 0901)   PRN Meds:.acetaminophen, calcium carbonate, haloperidol lactate, iohexol, ondansetron (ZOFRAN) IV  Antimicrobials: Anti-infectives (From admission, onward)    Start     Dose/Rate Route Frequency Ordered Stop   02/03/22 1715  levofloxacin (LEVAQUIN) tablet 500 mg   Status:  Discontinued        500 mg Oral Daily 02/03/22 1627 02/05/22 1355   02/02/22 2045  levofloxacin (LEVAQUIN) IVPB 500 mg  Status:  Discontinued       Note to Pharmacy: Adjust as needed - switching to IV since unable to take PO   500 mg 100 mL/hr over 60 Minutes Intravenous Every 24 hours 02/02/22 1959 02/03/22 1627   02/01/22 1400  levofloxacin (LEVAQUIN) tablet 500 mg  Status:  Discontinued        500 mg Oral Daily 02/01/22 0911 02/02/22 1959   01/31/22 1000  vancomycin (VANCOREADY) IVPB 750 mg/150 mL  Status:  Discontinued        750 mg 150 mL/hr over 60 Minutes Intravenous Every 12 hours 01/31/22 0003 01/31/22 0848   01/31/22 0200  vancomycin (VANCOREADY) IVPB 1500 mg/300 mL  Status:  Discontinued        1,500 mg 150 mL/hr over 120 Minutes Intravenous Every 12 hours 01/30/22 1241 01/30/22 2348   01/30/22 2100  aztreonam (AZACTAM) 2 g in sodium chloride 0.9 % 100 mL IVPB  Status:  Discontinued        2 g 200 mL/hr over 30 Minutes Intravenous Every 8 hours 01/30/22 1240 02/01/22 0923   01/30/22 1245  aztreonam (AZACTAM) 2 g in sodium chloride 0.9 % 100 mL IVPB        2 g 200 mL/hr over 30 Minutes Intravenous  Once 01/30/22 1231 01/30/22 1408   01/30/22 1245  metroNIDAZOLE (FLAGYL) IVPB 500 mg        500 mg 100 mL/hr over 60 Minutes Intravenous  Once 01/30/22 1231 01/30/22 1408   01/30/22 1245  vancomycin (VANCOCIN) IVPB 1000 mg/200 mL premix  Status:  Discontinued        1,000 mg 200 mL/hr over 60 Minutes Intravenous  Once 01/30/22 1231 01/30/22 1239   01/30/22 1245  vancomycin (VANCOREADY) IVPB 2000 mg/400 mL        2,000 mg 200 mL/hr over 120 Minutes Intravenous  Once 01/30/22 1239 01/30/22 1607        I have personally reviewed the following labs and images: CBC: Recent Labs  Lab 02/04/22 0648 02/04/22 1310 02/04/22 1944 02/05/22 0109 02/05/22 0658 02/06/22 0307 02/07/22 0311  WBC 26.0* 24.1* 25.1* 20.2* 20.6* 17.3* 17.1*  NEUTROABS 23.9* 21.2* 20.8* 17.5*  17.6*  --   --   HGB 9.7* 9.2* 9.1* 8.3* 8.5* 8.7* 9.5*  HCT 28.7* 25.8* 27.1* 23.9* 25.6* 25.7* 27.4*  MCV 78.8* 77.7* 79.5* 79.4* 80.8 80.3 79.2*  PLT 153 160 158 145* 154 166 197   BMP &GFR Recent Labs  Lab 02/03/22 0356 02/03/22 1228 02/04/22 5009 02/05/22 0658 02/05/22 1516  02/07/22 0311  NA 132* 131* 130* 130* 130* 130*  K 4.1 4.1 4.1 3.7 3.9 3.6  CL 110 103 97* 96* 96* 99  CO2 19* 20* '23 27 27 26  '$ GLUCOSE 149* 131* 149* 147* 117* 128*  BUN 31* 35* 27* '20 17 9  '$ CREATININE 1.12* 1.07* 0.85 0.76 0.71 0.49  CALCIUM 7.4* 7.7* 7.8* 7.8* 7.7* 7.9*  MG 2.2  --  2.1 2.0 2.0 1.9  PHOS  --   --   --   --  2.1* 2.7   Estimated Creatinine Clearance: 69.3 mL/min (by C-G formula based on SCr of 0.49 mg/dL). Liver & Pancreas: Recent Labs  Lab 02/02/22 0514 02/03/22 0356 02/04/22 0648 02/05/22 0658 02/05/22 1516 02/07/22 0311  AST '21 16 21 19  '$ --   --   ALT '28 24 26 26  '$ --   --   ALKPHOS 35* 35* 36* 30*  --   --   BILITOT 0.5 1.0 0.7 0.6  --   --   PROT 4.9* 4.5* 5.0* 4.8*  --   --   ALBUMIN 2.3* 2.1* 2.4* 2.3* 2.4* 2.5*   No results for input(s): "LIPASE", "AMYLASE" in the last 168 hours.  No results for input(s): "AMMONIA" in the last 168 hours. Diabetic: No results for input(s): "HGBA1C" in the last 72 hours. Recent Labs  Lab 02/07/22 0811 02/07/22 1220 02/07/22 1705 02/07/22 2230 02/08/22 0702  GLUCAP 113* 126* 169* 202* 160*   Cardiac Enzymes: No results for input(s): "CKTOTAL", "CKMB", "CKMBINDEX", "TROPONINI" in the last 168 hours.  No results for input(s): "PROBNP" in the last 8760 hours. Coagulation Profile: Recent Labs  Lab 02/02/22 2223  INR 1.8*   Thyroid Function Tests: No results for input(s): "TSH", "T4TOTAL", "FREET4", "T3FREE", "THYROIDAB" in the last 72 hours. Lipid Profile: No results for input(s): "CHOL", "HDL", "LDLCALC", "TRIG", "CHOLHDL", "LDLDIRECT" in the last 72 hours. Anemia Panel: Recent Labs    02/07/22 0311  VITAMINB12 646   FOLATE 15.2  FERRITIN 74  TIBC 371  IRON 30  RETICCTPCT 6.5*   Urine analysis:    Component Value Date/Time   COLORURINE YELLOW 01/30/2022 1720   APPEARANCEUR CLEAR 01/30/2022 1720   LABSPEC 1.044 (H) 01/30/2022 1720   PHURINE 5.0 01/30/2022 1720   GLUCOSEU NEGATIVE 01/30/2022 1720   HGBUR SMALL (A) 01/30/2022 1720   BILIRUBINUR NEGATIVE 01/30/2022 1720   KETONESUR 20 (A) 01/30/2022 1720   PROTEINUR 30 (A) 01/30/2022 1720   NITRITE POSITIVE (A) 01/30/2022 1720   LEUKOCYTESUR SMALL (A) 01/30/2022 1720   Sepsis Labs: Invalid input(s): "PROCALCITONIN", "LACTICIDVEN"  Microbiology: Recent Results (from the past 240 hour(s))  Culture, blood (routine x 2)     Status: None   Collection Time: 01/30/22 11:59 AM   Specimen: Right Antecubital; Blood  Result Value Ref Range Status   Specimen Description   Final    RIGHT ANTECUBITAL BOTTLES DRAWN AEROBIC AND ANAEROBIC   Special Requests Blood Culture adequate volume  Final   Culture   Final    NO GROWTH 5 DAYS Performed at [facility], [address] [place], [place] [address]    Report Status 02/04/2022 FINAL  Final  Culture, blood (routine x 2)     Status: None   Collection Time: 01/30/22 12:38 PM   Specimen: BLOOD LEFT HAND  Result Value Ref Range Status   Specimen Description   Final    BLOOD LEFT HAND BOTTLES DRAWN AEROBIC AND ANAEROBIC   Special Requests Blood Culture adequate  volume  Final   Culture   Final    NO GROWTH 5 DAYS Performed at [facility], [address] [place], [place] [address]    Report Status 02/04/2022 FINAL  Final  Resp Panel by RT-PCR (Flu A&B, Covid) Anterior Nasal Swab     Status: None   Collection Time: 01/30/22  1:59 PM   Specimen: Anterior Nasal Swab  Result Value Ref Range Status   SARS Coronavirus 2 by RT PCR NEGATIVE NEGATIVE Final    Comment: (NOTE) SARS-CoV-2 target nucleic acids are NOT DETECTED.  The SARS-CoV-2 RNA is generally detectable in upper respiratory specimens during  the acute phase of infection. The lowest concentration of SARS-CoV-2 viral copies this assay can detect is 138 copies/mL. A negative result does not preclude SARS-Cov-2 infection and should not be used as the sole basis for treatment or other patient management decisions. A negative result may occur with  improper specimen collection/handling, submission of specimen other than nasopharyngeal swab, presence of viral mutation(s) within the areas targeted by this assay, and inadequate number of viral copies(<138 copies/mL). A negative result must be combined with clinical observations, patient history, and epidemiological information. The expected result is Negative.  Fact Sheet for Patients:  EntrepreneurPulse.com.au  Fact Sheet for Healthcare Providers:  IncredibleEmployment.be  This test is no t yet approved or cleared by the [place] FDA and  has been authorized for detection and/or diagnosis of SARS-CoV-2 by FDA under an Emergency Use Authorization (EUA). This EUA will remain  in effect (meaning this test can be used) for the duration of the COVID-19 declaration under Section 564(b)(1) of the Act, 21 U.S.C.section 360bbb-3(b)(1), unless the authorization is terminated  or revoked sooner.       Influenza A by PCR NEGATIVE NEGATIVE Final   Influenza B by PCR NEGATIVE NEGATIVE Final    Comment: (NOTE) The Xpert Xpress SARS-CoV-2/FLU/RSV plus assay is intended as an aid in the diagnosis of influenza from Nasopharyngeal swab specimens and should not be used as a sole basis for treatment. Nasal washings and aspirates are unacceptable for Xpert Xpress SARS-CoV-2/FLU/RSV testing.  Fact Sheet for Patients: EntrepreneurPulse.com.au  Fact Sheet for Healthcare Providers: IncredibleEmployment.be  This test is not yet approved or cleared by the [place] FDA and has been authorized for detection and/or  diagnosis of SARS-CoV-2 by FDA under an Emergency Use Authorization (EUA). This EUA will remain in effect (meaning this test can be used) for the duration of the COVID-19 declaration under Section 564(b)(1) of the Act, 21 U.S.C. section 360bbb-3(b)(1), unless the authorization is terminated or revoked.  Performed at [facility], [address], [place], [place] [address]   Urine Culture     Status: Abnormal   Collection Time: 01/30/22  5:20 PM   Specimen: Urine, Catheterized  Result Value Ref Range Status   Specimen Description   Final    URINE, CATHETERIZED Performed at [facility], [address], [place], [place] [address]    Special Requests   Final    NONE Performed at [facility], [address] [place], [place] [address]    Culture (A)  Final    <10,000 COLONIES/mL INSIGNIFICANT GROWTH Performed at [place] [address], [place], [place] [address]    Report Status 02/01/2022 FINAL  Final  MRSA Next Gen by PCR, Nasal     Status: None   Collection Time: 01/30/22  8:35 PM   Specimen: Nasal Mucosa; Nasal Swab  Result Value Ref Range  Status   MRSA by PCR Next Gen NOT DETECTED NOT DETECTED Final    Comment: (NOTE) The GeneXpert MRSA Assay (FDA approved for NASAL specimens only), is one component of a comprehensive MRSA colonization surveillance program. It is not intended to diagnose MRSA infection nor to guide or monitor treatment for MRSA infections. Test performance is not FDA approved in patients less than 71 years old. Performed at [facility], [place] [address], [place], [place] [address]     Radiology Studies: No results found.    Oluwasemilore Pascuzzi T. [place]  If 7PM-7AM, please contact night-coverage [email or web address] 02/08/2022, 10:46 AM

## 2022-02-08 NOTE — Progress Notes (Signed)
Received a call from the patient's sister Calvert Cantor [phone].  Sister lives in [place] and unable to come visit the patient at this time.  Will try tomorrow.  Patient made aware of the sister's call.

## 2022-02-08 NOTE — Progress Notes (Signed)
Patient ID: [name], female   DOB: March 08, 1956, 66 y.o.   MRN: [id number]    Progress Note from the Palliative Medicine Team at [facility]   Patient Name: Jennifer Pacheco        Date: 02/08/2022 DOB: [birth year]  Age: 66 y.o. MRN#: [id number] Attending Physician: Mercy Riding, MD Primary Care Physician: [name], NP Admit Date: 01/30/2022   Medical records reviewed   66 y.o. female   admitted on 01/30/2022 with  PMH of HTN, DMT2 presents to [facility] ED on 6/2 with AMS.  Patient was found down on floor at home altered covered in feces and urine and someone checked on her today on 6/2.  EMS transported patient to [facility].   Unsure exactly how long she has been down.  Patient noted to be hypoxic and hypotensive. Afebrile. Started on IV fluids for hypotension. Sats improved on [address].    CT chest showing submassive PE with RV/LV ratio 1.2-1.5. Started on heparin. Broad spectrum abx started. CT abd and UA concerning for UTI w/ possible pyelonephritis. CXR unremarkable.    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 NSG consulted recommend MRI and decadron.   Retroperitoneal bleed, requiring transfusion  Effort to explore and secure decision-maker for this patient who continues without medical decision-making capacity.  Adult Protective Services with [facility] involved       Corene Cornea Evans/LCSW with [facility] [phone]   Patient is alert and oriented to person and place however she remains intermittently confused and without medical decisional capacity.  I spoke to patient's brother Jennifer Pacheco today.  Education offered on patient's current medical situation. he understands the seriousness of his sister's current medical situation and that pending decisions that are being discussed.    He is willing to be his sisters main contact  and decision maker in the event that she cannot make decisions for herself however he believes that patient's daughter Jennifer Pacheco will be coming to the hospital today to visit.  If [name] is willing he believes she is the best person to speak for her mother. I discussed with nursing that if daughter comes to the bedside to please secure her information and contact information, and to let me know so I can possibly have a goals of care meeting with family.  Complicated psychosocial situation, decisions and treatment plan will be dependent on outcomes over the next several days   PMT will continue to support holistically  Wadie Lessen NP  Palliative Medicine Team Team Phone # [phone] Pager [phone]

## 2022-02-09 DIAGNOSIS — I2699 Other pulmonary embolism without acute cor pulmonale: Secondary | ICD-10-CM | POA: Diagnosis not present

## 2022-02-09 DIAGNOSIS — G9341 Metabolic encephalopathy: Secondary | ICD-10-CM | POA: Diagnosis not present

## 2022-02-09 DIAGNOSIS — Z515 Encounter for palliative care: Secondary | ICD-10-CM | POA: Diagnosis not present

## 2022-02-09 DIAGNOSIS — D62 Acute posthemorrhagic anemia: Secondary | ICD-10-CM | POA: Diagnosis not present

## 2022-02-09 DIAGNOSIS — R933 Abnormal findings on diagnostic imaging of other parts of digestive tract: Secondary | ICD-10-CM | POA: Diagnosis not present

## 2022-02-09 DIAGNOSIS — K661 Hemoperitoneum: Secondary | ICD-10-CM | POA: Diagnosis not present

## 2022-02-09 DIAGNOSIS — G9389 Other specified disorders of brain: Secondary | ICD-10-CM | POA: Diagnosis not present

## 2022-02-09 DIAGNOSIS — I2609 Other pulmonary embolism with acute cor pulmonale: Secondary | ICD-10-CM | POA: Diagnosis not present

## 2022-02-09 LAB — RENAL FUNCTION PANEL
Albumin: 2.4 g/dL — ABNORMAL LOW (ref 3.5–5.0)
Anion gap: 8 (ref 5–15)
BUN: 16 mg/dL (ref 8–23)
CO2: 24 mmol/L (ref 22–32)
Calcium: 8.1 mg/dL — ABNORMAL LOW (ref 8.9–10.3)
Chloride: 99 mmol/L (ref 98–111)
Creatinine, Ser: 0.59 mg/dL (ref 0.44–1.00)
GFR, Estimated: >60 mL/min (ref >60)
Glucose, Bld: 155 mg/dL — ABNORMAL HIGH (ref 70–99)
Phosphorus: 2.6 mg/dL (ref 2.5–4.6)
Potassium: 4 mmol/L (ref 3.5–5.1)
Sodium: 131 mmol/L — ABNORMAL LOW (ref 135–145)

## 2022-02-09 LAB — CBC
HCT: 29.5 % — ABNORMAL LOW (ref 36.0–46.0)
Hemoglobin: 9.4 g/dL — ABNORMAL LOW (ref 12.0–15.0)
MCH: 26.3 pg (ref 26.0–34.0)
MCHC: 31.9 g/dL (ref 30.0–36.0)
MCV: 82.4 fL (ref 80.0–100.0)
Platelets: 282 10*3/uL (ref 150–400)
RBC: 3.58 MIL/uL — ABNORMAL LOW (ref 3.87–5.11)
RDW: 22.8 % — ABNORMAL HIGH (ref 11.5–15.5)
WBC: 24.9 10*3/uL — ABNORMAL HIGH (ref 4.0–10.5)
nRBC: 0.2 % (ref 0.0–0.2)

## 2022-02-09 LAB — GLUCOSE, CAPILLARY
Glucose-Capillary: 115 mg/dL — ABNORMAL HIGH (ref 70–99)
Glucose-Capillary: 178 mg/dL — ABNORMAL HIGH (ref 70–99)
Glucose-Capillary: 155 mg/dL — ABNORMAL HIGH (ref 70–99)

## 2022-02-09 LAB — MAGNESIUM: Magnesium: 2.1 mg/dL (ref 1.7–2.4)

## 2022-02-09 MED ORDER — LORAZEPAM 2 MG/ML IJ SOLN
1.0000 mg | Freq: Once | INTRAMUSCULAR | Status: AC | PRN
  Administered 2022-02-11: 1 mg via INTRAVENOUS
  Filled 2022-02-09: qty 1

## 2022-02-09 MED ORDER — ENOXAPARIN SODIUM 40 MG/0.4ML IJ SOSY
40.0000 mg | PREFILLED_SYRINGE | INTRAMUSCULAR | Status: DC
  Administered 2022-02-09 – 2022-03-04 (×24): 40 mg via SUBCUTANEOUS
  Filled 2022-02-09 (×24): qty 0.4

## 2022-02-09 NOTE — Progress Notes (Signed)
Occupational Therapy Treatment Patient Details Name: Jennifer Pacheco MRN: [id number] DOB: Dec 30, 1955 Today's Date: 02/09/2022   History of present illness Pt is 66 yo female who was found down in her home and taken to [facility]. 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 Pt with retroperitoneal bleed and IVC filter placed 6/5. PMH: DM2, HTN   OT comments  Pt progressing towards goals, pt able to complete LB bathing task and simulated toilet transfer with supervision-mod A using RW. Pt min guard for bed mobility. Pt requiring significant time to complete ADL tasks, frequent conversation during session requiring max cuing to redirect to task. Pt with continued L inattention during session, and presented with impaired fine motor skills impacting ability to open small containers. Pt presenting with impairments listed below, will follow acutely. Continue to recommend SNF.   Recommendations for follow up therapy are one component of a multi-disciplinary discharge planning process, led by the attending physician.  Recommendations may be updated based on patient status, additional functional criteria and insurance authorization.    Follow Up Recommendations  Skilled nursing-short term rehab (<3 hours/day)    Assistance Recommended at Discharge Frequent or constant Supervision/Assistance  Patient can return home with the following  A little help with walking and/or transfers;A little help with bathing/dressing/bathroom;Assistance with cooking/housework;Assistance with feeding;Help with stairs or ramp for entrance;Assist for transportation;Direct supervision/assist for financial management;Direct supervision/assist for medications management   Equipment Recommendations  None recommended by OT;Other (comment) (defer to next venue of care)    Recommendations for Other Services PT consult    Precautions / Restrictions Precautions Precautions: Fall Precaution  Comments: HOH; L side inattention Restrictions Weight Bearing Restrictions: No       Mobility Bed Mobility Overal bed mobility: Needs Assistance         Sit to supine: Min guard   General bed mobility comments: to bring LLE onto bed    Transfers Overall transfer level: Needs assistance Equipment used: Rolling walker (2 wheels) Transfers: Sit to/from Stand, Bed to chair/wheelchair/BSC Sit to Stand: Mod assist     Step pivot transfers: Mod assist           Balance Overall balance assessment: Needs assistance Sitting-balance support: Bilateral upper extremity supported, Feet supported Sitting balance-Leahy Scale: Fair Sitting balance - Comments: fatigues quickly in unsupported sitting   Standing balance support: Single extremity supported, During functional activity Standing balance-Leahy Scale: Poor Standing balance comment: reliant on external support                           ADL either performed or assessed with clinical judgement   ADL Overall ADL's : Needs assistance/impaired     Grooming: Minimal assistance;Sitting Grooming Details (indicate cue type and reason): to open containers             Lower Body Dressing: Supervision/safety;Sitting/lateral leans Lower Body Dressing Details (indicate cue type and reason): applying lotion to BLE's Toilet Transfer: Rolling walker (2 wheels);BSC/3in1;Stand-pivot;Moderate assistance Toilet Transfer Details (indicate cue type and reason): significantly increased time and motivation to perform standing/transfer         Functional mobility during ADLs: Minimal assistance;Rolling walker (2 wheels)      Extremity/Trunk Assessment Upper Extremity Assessment Upper Extremity Assessment: Generalized weakness   Lower Extremity Assessment Lower Extremity Assessment: Defer to PT evaluation        Vision   Vision Assessment?: Vision impaired- to be further  tested in functional context Additional  Comments: R gaze   Perception Perception Perception: Not tested   Praxis Praxis Praxis: Not tested    Cognition Arousal/Alertness: Awake/alert Behavior During Therapy: Impulsive Overall Cognitive Status: Impaired/Different from baseline Area of Impairment: Attention, Memory, Following commands, Safety/judgement, Awareness, Problem solving                 Orientation Level: Disoriented to, Time, Situation Current Attention Level: Focused Memory: Decreased recall of precautions, Decreased short-term memory Following Commands: Follows one step commands inconsistently Safety/Judgement: Decreased awareness of safety, Decreased awareness of deficits Awareness: Intellectual Problem Solving: Difficulty sequencing, Requires verbal cues, Requires tactile cues General Comments: pt tangential, hyperverbal during session, requries frequent redirection, requires increased time between tasks        Exercises      Shoulder Instructions       General Comments VSS on RA    Pertinent Vitals/ Pain       Pain Assessment Pain Assessment: No/denies pain  Home Living                                          Prior Functioning/Environment              Frequency  Min 2X/week        Progress Toward Goals  OT Goals(current goals can now be found in the care plan section)  Progress towards OT goals: Progressing toward goals  Acute Rehab OT Goals Patient Stated Goal: to go home OT Goal Formulation: With patient Time For Goal Achievement: 02/15/22 Potential to Achieve Goals: Good ADL Goals Pt Will Perform Grooming: Independently;standing Pt Will Perform Upper Body Bathing: Independently;sitting;standing Pt Will Perform Lower Body Bathing: Independently;sit to/from stand Pt Will Perform Upper Body Dressing: Independently;sitting;standing Pt Will Perform Lower Body Dressing: Independently;sit to/from stand Pt Will Transfer to Toilet:  Independently;ambulating;bedside commode Pt Will Perform Toileting - Clothing Manipulation and hygiene: Independently;sit to/from stand Additional ADL Goal #1: Pt will be independent in and out of bed  Plan Discharge plan remains appropriate    Co-evaluation                 AM-PAC OT "6 Clicks" Daily Activity     Outcome Measure   Help from another person eating meals?: A Little Help from another person taking care of personal grooming?: A Little Help from another person toileting, which includes using toliet, bedpan, or urinal?: A Little Help from another person bathing (including washing, rinsing, drying)?: A Little Help from another person to put on and taking off regular upper body clothing?: A Little Help from another person to put on and taking off regular lower body clothing?: A Little 6 Click Score: 18    End of Session Equipment Utilized During Treatment: Rolling walker (2 wheels);Gait belt  OT Visit Diagnosis: Unsteadiness on feet (R26.81);Other abnormalities of gait and mobility (R26.89);History of falling (Z91.81);Other symptoms and signs involving cognitive function   Activity Tolerance Patient tolerated treatment well   Patient Left in bed;with call bell/phone within reach;with bed alarm set   Nurse Communication Mobility status        Time: 3614-4315 OT Time Calculation (min): 48 min  Charges: OT General Charges $OT Visit: 1 Visit OT Treatments $Self Care/Home Management : 8-22 mins $Therapeutic Activity: 23-37 mins  Lanson Randle, OTD, OTR/L Acute Rehab [phone]   Kaylyn Lim  02/09/2022, 5:48 PM

## 2022-02-09 NOTE — Progress Notes (Addendum)
PROGRESS NOTE  Jennifer Pacheco CHE:[id number] DOB: 10-22-1955   PCP: [name], NP  Patient is from: Home.  Since she lives alone.  DOA: 01/30/2022 LOS: 57  Chief complaints Chief Complaint  Patient presents with   Hypotension     Brief Narrative / Interim history: 66 year old F with PMH of DM-2 and HTN presented to [facility] ED on 6/2 with AMS after she was found down altered in feces and urine by someone who went to check on her.  She reports difficulty getting up after fall on her left buttock.  Unclear how long she was down.  She was found to be hypoxic and hypotensive.  She was started on IV fluids, vasopressors, supplemental oxygen and broad-spectrum antibiotics.  CT chest showed submassive PE with RV/LV ratio 1.2-1.5.  She was started on heparin. CT abd and UA concerning for UTI w/ possible pyelonephritis. CXR unremarkable. 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 NSG consulted recommend MRI and decadron, and transfer to [facility].    Patient was in ICU since arrival at [facility].  She had ABLA likely due to retroperitoneal bleed.  Heparin was discontinued.  She had IVC filter placed.  She came off vasopressor on 6/5.  She was transferred to [facility] hospitalist service on 6/8.  She is currently stable on room air.  Neurosurgery reconsulted and recommended MRI brain with and without contrast.  Therapy recommended SNF.  Reportedly, APS involved in the case.  Palliative medicine following.  Subjective: Seen and examined earlier this morning.  No major events overnight of this morning.  No complaints.   Objective: Vitals:   02/09/22 0120 02/09/22 0449 02/09/22 0712 02/09/22 1158  BP: 136/76 135/85 122/81 (!) 166/107  Pulse: 80 71 73 79  Resp: '20 17 14 20  '$ Temp: 98.6 F (37 C) 98.4 F (36.9 C) 98.1 F (36.7 C) 98.7 F (37.1 C)  TempSrc: Oral  Oral Oral Oral  SpO2: 99% 98% 98% 96%  Weight:      Height:        Examination:  GENERAL: No apparent distress.  Nontoxic. HEENT: MMM.  Vision grossly intact.  Diminished hearing. NECK: Supple.  No apparent JVD.  RESP:  No IWOB.  Fair aeration bilaterally. CVS:  RRR. Heart sounds normal.  ABD/GI/GU: BS+. Abd soft, NTND.  MSK/EXT:  Moves extremities. No apparent deformity. No edema.  SKIN: no apparent skin lesion or wound NEURO: Awake and alert. Oriented appropriately.  No apparent focal neuro deficit. PSYCH: Calm. Normal affect.   Procedures:  None  Microbiology summarized: [id number] and influenza PCR nonreactive. Blood cultures negative so far Urine culture with insignificant growth.  Assessment and Plan: Brain mass with vasogenic edema and mass effect CT and MRI brain showed 5.2 x 5.3 cm large right basal ganglia mass with surrounding edema and 11 mm left midline shift and near complete effacement of right lateral ventricle.  This is probably what led to her fall and altered mental status.  She was started on Decadron.  -Continue Decadron taper-orders in. -Palliative medicine following.  Seems APS has been involved as well -Unfortunately, none of the siblings visited yet to help with goal of care and medical decision -NS signed off 6/10 recommending reconsult when medically cleared for continued work-up Addendum -NS reconsulted 6/12 and recommended MRI brain with and without contrast.  Acute metabolic encephalopathy: seems to have resolved.  She is oriented x4.  Cognitive communication deficit-she had cognitive evaluation by SLP -Reorientation and delirium precautions -Minimize sedating medications -Continue SLP treatment  Physical deconditioning: lives alone. Found down on feces and urine before arrival.  Reportedly estranged from siblings although she said they will be coming to visit.  It seems APS has been involved.  She has a friend visiting that she identifies as a  boyfriend.  Palliative medicine consulted for goals of care discussion.  Therapy recommends SNF.  Acute pulmonary embolism with acute cor pulmonale (HCC) Submassive PE with RV to LV ratio of 1.28-1.54.  TTE normal without evidence of right heart strain.  She was in shock on arrival.  She was started on IV heparin, which was later discontinued due to [facility] and retroperitoneal bleed.  She has been off IV heparin since  6/5.  She is currently on room air. -Start prophylactic dose subcu Lovenox.   Hypovolemic shock ([place]): Likely from poor p.o. intake and dehydration.  However, she could be in shock from PE and sepsis in the setting of possible UTI/pyelonephritis.  She has been off vasopressors since 6/5.  Now hemodynamically stable.  ABLA likely due to retroperitoneal bleed: Stable. Retroperitoneal hematoma: Due to [facility] for PE.  Heparin discontinued after IVC filter on 6/5.  Recent Labs    02/03/22 1849 02/04/22 0046 02/04/22 0648 02/04/22 1310 02/04/22 1944 02/05/22 0109 02/05/22 0658 02/06/22 0307 02/07/22 0311 02/09/22 0332  HGB 11.2* 9.5* 9.7* 9.2* 9.1* 8.3* 8.5* 8.7* 9.5* 9.4*  -Continue monitoring -Rechallenge with prophylactic dose Lovenox  Controlled NIDDM-2: A1c 5.5%.  Not on medication at home. Recent Labs  Lab 02/08/22 0702 02/08/22 1141 02/08/22 1622 02/08/22 2054 02/09/22 1157  GLUCAP 160* 227* 164* 164* 115*  -Continue SSI  Distal esophageal thickening: noted on CT.  At [place] junction suspicious for neoplasm or severe esophagitis. Esophageal dysphagia GERD -Continue PPI -On dysphagia 3 diet per SLP.  Acute rhabdomyolysis, now resolved  Hyponatremia: Stable.  Continue monitoring  Lactic acidosis: Resolved.  Elevated troponin: Likely demand ischemia in the setting of right heart strain.    AKI: Likely prerenal.  Resolved.  Bandemia:Likely demargination from steroid.  Improved.  Continue monitoring.  Acute respiratory failure with hypoxia:Resolved.  On room  air.   Pressure skin injury: POA Pressure Injury 01/30/22 Sacrum Right;Medial Stage 2 -  Partial thickness loss of dermis presenting as a shallow open injury with a red, pink wound bed without slough. (Active)  01/30/22 2053  Location: Sacrum  Location Orientation: Right;Medial  Staging: Stage 2 -  Partial thickness loss of dermis presenting as a shallow open injury with a red, pink wound bed without slough.  Wound Description (Comments):   Present on Admission: Yes  Dressing Type Foam - Lift dressing to assess site every shift 02/09/22 0800     Pressure Injury 01/30/22 Coccyx Lower;Medial Stage 2 -  Partial thickness loss of dermis presenting as a shallow open injury with a red, pink wound bed without slough. (Active)  01/30/22 2053  Location: Coccyx  Location Orientation: Lower;Medial  Staging: Stage 2 -  Partial thickness loss of dermis presenting as a shallow open injury with a red, pink wound bed without slough.  Wound Description (Comments):   Present on Admission: Yes  Dressing Type Foam - Lift dressing to assess site every shift 02/08/22 2045   DVT prophylaxis:  enoxaparin (LOVENOX) injection 40 mg Start: 02/09/22 1630 Place and maintain sequential compression device Start: 02/02/22 1450  Code Status: Full code Family  Communication: None at bedside. Level of care: Med-Surg Status is: Inpatient Remains inpatient appropriate because: Evaluation for brain tumor and safe disposition   Final disposition: SNF Consultants:  Pulmonology admitted patient Neurosurgery Palliative medicine  Sch Meds:  Scheduled Meds:  dexamethasone  4 mg Oral Q12H   Followed by   Derrill Memo ON 02/14/2022] dexamethasone  4 mg Oral Daily   docusate sodium  100 mg Oral BID   enoxaparin (LOVENOX) injection  40 mg Subcutaneous Q24H   insulin aspart  0-9 Units Subcutaneous TID AC & HS   lisinopril  20 mg Oral Daily   pantoprazole  40 mg Oral BID   polyethylene glycol  17 g Oral BID   senna  1 tablet  Oral QHS   Continuous Infusions:  sodium chloride Stopped (02/02/22 0929)   PRN Meds:.acetaminophen, calcium carbonate, haloperidol lactate, iohexol, ondansetron (ZOFRAN) IV  Antimicrobials: Anti-infectives (From admission, onward)    Start     Dose/Rate Route Frequency Ordered Stop   02/03/22 1715  levofloxacin (LEVAQUIN) tablet 500 mg  Status:  Discontinued        500 mg Oral Daily 02/03/22 1627 02/05/22 1355   02/02/22 2045  levofloxacin (LEVAQUIN) IVPB 500 mg  Status:  Discontinued       Note to Pharmacy: Adjust as needed - switching to IV since unable to take PO   500 mg 100 mL/hr over 60 Minutes Intravenous Every 24 hours 02/02/22 1959 02/03/22 1627   02/01/22 1400  levofloxacin (LEVAQUIN) tablet 500 mg  Status:  Discontinued        500 mg Oral Daily 02/01/22 0911 02/02/22 1959   01/31/22 1000  vancomycin (VANCOREADY) IVPB 750 mg/150 mL  Status:  Discontinued        750 mg 150 mL/hr over 60 Minutes Intravenous Every 12 hours 01/31/22 0003 01/31/22 0848   01/31/22 0200  vancomycin (VANCOREADY) IVPB 1500 mg/300 mL  Status:  Discontinued        1,500 mg 150 mL/hr over 120 Minutes Intravenous Every 12 hours 01/30/22 1241 01/30/22 2348   01/30/22 2100  aztreonam (AZACTAM) 2 g in sodium chloride 0.9 % 100 mL IVPB  Status:  Discontinued        2 g 200 mL/hr over 30 Minutes Intravenous Every 8 hours 01/30/22 1240 02/01/22 0923   01/30/22 1245  aztreonam (AZACTAM) 2 g in sodium chloride 0.9 % 100 mL IVPB        2 g 200 mL/hr over 30 Minutes Intravenous  Once 01/30/22 1231 01/30/22 1408   01/30/22 1245  metroNIDAZOLE (FLAGYL) IVPB 500 mg        500 mg 100 mL/hr over 60 Minutes Intravenous  Once 01/30/22 1231 01/30/22 1408   01/30/22 1245  vancomycin (VANCOCIN) IVPB 1000 mg/200 mL premix  Status:  Discontinued        1,000 mg 200 mL/hr over 60 Minutes Intravenous  Once 01/30/22 1231 01/30/22 1239   01/30/22 1245  vancomycin (VANCOREADY) IVPB 2000 mg/400 mL        2,000 mg 200 mL/hr  over 120 Minutes Intravenous  Once 01/30/22 1239 01/30/22 1607        I have personally reviewed the following labs and images: CBC: Recent Labs  Lab 02/04/22 0648 02/04/22 1310 02/04/22 1944 02/05/22 0109 02/05/22 0658 02/06/22 0307 02/07/22 0311 02/09/22 0332  WBC 26.0* 24.1* 25.1* 20.2* 20.6* 17.3* 17.1* 24.9*  NEUTROABS 23.9* 21.2* 20.8* 17.5* 17.6*  --   --   --   HGB  9.7* 9.2* 9.1* 8.3* 8.5* 8.7* 9.5* 9.4*  HCT 28.7* 25.8* 27.1* 23.9* 25.6* 25.7* 27.4* 29.5*  MCV 78.8* 77.7* 79.5* 79.4* 80.8 80.3 79.2* 82.4  PLT 153 160 158 145* 154 166 197 282   BMP &GFR Recent Labs  Lab 02/04/22 0648 02/05/22 0658 02/05/22 1516 02/07/22 0311 02/09/22 0332  NA 130* 130* 130* 130* 131*  K 4.1 3.7 3.9 3.6 4.0  CL 97* 96* 96* 99 99  CO2 '23 27 27 26 24  '$ GLUCOSE 149* 147* 117* 128* 155*  BUN 27* '20 17 9 16  '$ CREATININE 0.85 0.76 0.71 0.49 0.59  CALCIUM 7.8* 7.8* 7.7* 7.9* 8.1*  MG 2.1 2.0 2.0 1.9 2.1  PHOS  --   --  2.1* 2.7 2.6   Estimated Creatinine Clearance: 69.3 mL/min (by C-G formula based on SCr of 0.59 mg/dL). Liver & Pancreas: Recent Labs  Lab 02/03/22 0356 02/04/22 6440 02/05/22 0658 02/05/22 1516 02/07/22 0311 02/09/22 0332  AST '16 21 19  '$ --   --   --   ALT '24 26 26  '$ --   --   --   ALKPHOS 35* 36* 30*  --   --   --   BILITOT 1.0 0.7 0.6  --   --   --   PROT 4.5* 5.0* 4.8*  --   --   --   ALBUMIN 2.1* 2.4* 2.3* 2.4* 2.5* 2.4*   No results for input(s): "LIPASE", "AMYLASE" in the last 168 hours.  No results for input(s): "AMMONIA" in the last 168 hours. Diabetic: No results for input(s): "HGBA1C" in the last 72 hours. Recent Labs  Lab 02/08/22 0702 02/08/22 1141 02/08/22 1622 02/08/22 2054 02/09/22 1157  GLUCAP 160* 227* 164* 164* 115*   Cardiac Enzymes: No results for input(s): "CKTOTAL", "CKMB", "CKMBINDEX", "TROPONINI" in the last 168 hours.  No results for input(s): "PROBNP" in the last 8760 hours. Coagulation Profile: Recent Labs  Lab  02/02/22 2223  INR 1.8*   Thyroid Function Tests: No results for input(s): "TSH", "T4TOTAL", "FREET4", "T3FREE", "THYROIDAB" in the last 72 hours. Lipid Profile: No results for input(s): "CHOL", "HDL", "LDLCALC", "TRIG", "CHOLHDL", "LDLDIRECT" in the last 72 hours. Anemia Panel: Recent Labs    02/07/22 0311  VITAMINB12 646  FOLATE 15.2  FERRITIN 74  TIBC 371  IRON 30  RETICCTPCT 6.5*   Urine analysis:    Component Value Date/Time   COLORURINE YELLOW 01/30/2022 1720   APPEARANCEUR CLEAR 01/30/2022 1720   LABSPEC 1.044 (H) 01/30/2022 1720   PHURINE 5.0 01/30/2022 1720   GLUCOSEU NEGATIVE 01/30/2022 1720   HGBUR SMALL (A) 01/30/2022 1720   BILIRUBINUR NEGATIVE 01/30/2022 1720   KETONESUR 20 (A) 01/30/2022 1720   PROTEINUR 30 (A) 01/30/2022 1720   NITRITE POSITIVE (A) 01/30/2022 1720   LEUKOCYTESUR SMALL (A) 01/30/2022 1720   Sepsis Labs: Invalid input(s): "PROCALCITONIN", "LACTICIDVEN"  Microbiology: Recent Results (from the past 240 hour(s))  Urine Culture     Status: Abnormal   Collection Time: 01/30/22  5:20 PM   Specimen: Urine, Catheterized  Result Value Ref Range Status   Specimen Description   Final    URINE, CATHETERIZED Performed at [facility], [address], [place], [place] [address]    Special Requests   Final    NONE Performed at [facility], [address], [place], [place] [address]    Culture (A)  Final    <10,000 COLONIES/mL INSIGNIFICANT GROWTH Performed at [place],  [place] [address]    Report Status 02/01/2022 FINAL  Final  MRSA Next Gen by PCR, Nasal     Status: None   Collection Time: 01/30/22  8:35 PM   Specimen: Nasal Mucosa; Nasal Swab  Result Value Ref Range Status   MRSA by PCR Next Gen NOT DETECTED NOT DETECTED Final    Comment: (NOTE) The GeneXpert MRSA Assay (FDA approved for NASAL specimens only), is one component of a comprehensive MRSA colonization surveillance program. It is not  intended to diagnose MRSA infection nor to guide or monitor treatment for MRSA infections. Test performance is not FDA approved in patients less than 79 years old. Performed at [facility], [facility] [address] [place], [place] [address]     Radiology Studies: No results found.    Angellica Maddison T. Kings Grant  If 7PM-7AM, please contact night-coverage [email or web address] 02/09/2022, 3:33 PM

## 2022-02-09 NOTE — Progress Notes (Signed)
Physical Therapy Treatment Patient Details Name: Jennifer Pacheco MRN: [id number] DOB: 26-Jan-1956 Today's Date: 02/09/2022   History of Present Illness Pt is 66 yo female who was found down in her home and taken to [facility]. 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 Pt with retroperitoneal bleed and IVC filter placed 6/5. PMH: DM2, HTN    PT Comments    Patient seen with main focus of treatment cleaning up from incontinent episode in the bed and getting up to recliner.  She is easily distracted and takes extra time to perform mobility with multimodal cues.  She has L sided inattention needing chair on R side to successfully get to chair.  Feel she will need STSNF level rehab at d/c.  PT will continue to follow.   Recommendations for follow up therapy are one component of a multi-disciplinary discharge planning process, led by the attending physician.  Recommendations may be updated based on patient status, additional functional criteria and insurance authorization.  Follow Up Recommendations  Skilled nursing-short term rehab (<3 hours/day)     Assistance Recommended at Discharge    Patient can return home with the following A lot of help with walking and/or transfers;A little help with bathing/dressing/bathroom;Assistance with cooking/housework;Direct supervision/assist for medications management;Direct supervision/assist for financial management;Assist for transportation;Help with stairs or ramp for entrance   Equipment Recommendations  Other (comment)    Recommendations for Other Services       Precautions / Restrictions Precautions Precautions: Fall Precaution Comments: HOH; L side inattention     Mobility  Bed Mobility Overal bed mobility: Needs Assistance Bed Mobility: Rolling, Sidelying to Sit Rolling: Min assist, Mod assist Sidelying to sit: Mod assist, Max assist       General bed mobility comments: rolling in bed for hygiene  as soiled with feces and urine.  Patient needing cues and increased time to follow commands and use the rail.  Assist for legs off bed and for trunk to sit up    Transfers Overall transfer level: Needs assistance   Transfers: Sit to/from Stand, Bed to chair/wheelchair/BSC Sit to Stand: Mod assist   Step pivot transfers: Mod assist       General transfer comment: up to stand with chair on L side, but pt did not see it nor attempt to pivot so returned to sitting and moved recliner to R side for improved awareness and able to pivot to R.    Ambulation/Gait               General Gait Details: unable today   Stairs             Wheelchair Mobility    Modified Rankin (Stroke Patients Only)       Balance Overall balance assessment: Needs assistance Sitting-balance support: Bilateral upper extremity supported, Feet supported Sitting balance-Leahy Scale: Poor Sitting balance - Comments: needs close S and UE support for safety     Standing balance-Leahy Scale: Poor                              Cognition Arousal/Alertness: Awake/alert Behavior During Therapy: Impulsive Overall Cognitive Status: Impaired/Different from baseline Area of Impairment: Attention, Memory, Following commands, Safety/judgement, Awareness, Problem solving                 Orientation Level: Disoriented to, Time, Situation Current Attention Level: Focused Memory: Decreased recall of precautions, Decreased short-term  memory Following Commands: Follows one step commands inconsistently Safety/Judgement: Decreased awareness of safety, Decreased awareness of deficits Awareness: Intellectual Problem Solving: Difficulty sequencing, Requires verbal cues, Requires tactile cues General Comments: frequent changes in conversation and reaching for perineal area during hygiene despite cues to keep hands away, poor carryover of techniques        Exercises      General Comments         Pertinent Vitals/Pain Pain Assessment Faces Pain Scale: Hurts little more Pain Location: buttocks during perineal hygiene Pain Descriptors / Indicators: Grimacing, Moaning Pain Intervention(s): Monitored during session    Home Living                          Prior Function            PT Goals (current goals can now be found in the care plan section) Progress towards PT goals: Progressing toward goals (slowly)    Frequency    Min 3X/week      PT Plan Current plan remains appropriate    Co-evaluation              AM-PAC PT "6 Clicks" Mobility   Outcome Measure  Help needed turning from your back to your side while in a flat bed without using bedrails?: A Lot Help needed moving from lying on your back to sitting on the side of a flat bed without using bedrails?: A Lot Help needed moving to and from a bed to a chair (including a wheelchair)?: A Lot Help needed standing up from a chair using your arms (e.g., wheelchair or bedside chair)?: A Lot Help needed to walk in hospital room?: Total Help needed climbing 3-5 steps with a railing? : Total 6 Click Score: 10    End of Session Equipment Utilized During Treatment: Gait belt Activity Tolerance: Patient tolerated treatment well Patient left: in chair;with call bell/phone within reach;with chair alarm set Nurse Communication: Mobility status;Other (comment) (new purewick needed) PT Visit Diagnosis: Unsteadiness on feet (R26.81);Difficulty in walking, not elsewhere classified (R26.2)     Time: 8250-5397 PT Time Calculation (min) (ACUTE ONLY): 27 min  Charges:  $Therapeutic Activity: 23-37 mins                     Jennifer Pacheco, PT Acute Rehabilitation Services QBHAL:[phone] Office:[phone] 02/09/2022    Jennifer Pacheco 02/09/2022, 4:23 PM

## 2022-02-09 NOTE — Progress Notes (Signed)
Patient ID: [name], female   DOB: Jan 03, 1956, 66 y.o.   MRN: [id number]    Progress Note from the Palliative Medicine Team at [facility]   Patient Name: Jennifer Pacheco        Date: 02/09/2022 DOB: 1955-09-13  Age: 66 y.o. MRN#: [id number] Attending Physician: Mercy Riding, MD Primary Care Physician: [name], NP Admit Date: 01/30/2022   Medical records reviewed   66 y.o. female   admitted on 01/30/2022 with  PMH of HTN, DMT2 presents to [facility] ED on 6/2 with AMS.  Patient was found down on floor at home altered covered in feces and urine and someone checked on her today on 6/2.  EMS transported patient to [facility].   Unsure exactly how long she has been down.  Patient noted to be hypoxic and hypotensive. Afebrile. Started on IV fluids for hypotension. Sats improved on [place].    CT chest showing submassive PE with RV/LV ratio 1.2-1.5. Started on heparin. Broad spectrum abx started. CT abd and UA concerning for UTI w/ possible pyelonephritis. CXR unremarkable.    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 NSG consulted recommend MRI and decadron.   Retroperitoneal bleed, requiring transfusion  Effort to explore and secure decision-maker for this patient who continues without medical decision-making capacity.  Adult Protective Services with [facility] involved       Corene Cornea Evans/LCSW with [facility] [phone]   Patient is alert and oriented to person and place however she remains intermittently confused and without medical decisional capacity.  I placed a call to patient's brother [name], await callback.  Patient's daughter never visited hospital or called     Late entry --brother never returned call  Complicated psychosocial situation, decisions and treatment plan will be dependent on outcomes over the next  several days.  Discussed with treatment team, likely we will need to [facility] APS for guardianship   PMT will continue to support holistically  Wadie Lessen NP  Palliative Medicine Team Team Phone # [phone] Pager [phone]

## 2022-02-10 DIAGNOSIS — I2609 Other pulmonary embolism with acute cor pulmonale: Secondary | ICD-10-CM | POA: Diagnosis not present

## 2022-02-10 DIAGNOSIS — G9341 Metabolic encephalopathy: Secondary | ICD-10-CM | POA: Diagnosis not present

## 2022-02-10 DIAGNOSIS — D62 Acute posthemorrhagic anemia: Secondary | ICD-10-CM | POA: Diagnosis not present

## 2022-02-10 DIAGNOSIS — R933 Abnormal findings on diagnostic imaging of other parts of digestive tract: Secondary | ICD-10-CM | POA: Diagnosis not present

## 2022-02-10 LAB — GLUCOSE, CAPILLARY
Glucose-Capillary: 135 mg/dL — ABNORMAL HIGH (ref 70–99)
Glucose-Capillary: 113 mg/dL — ABNORMAL HIGH (ref 70–99)
Glucose-Capillary: 161 mg/dL — ABNORMAL HIGH (ref 70–99)
Glucose-Capillary: 141 mg/dL — ABNORMAL HIGH (ref 70–99)

## 2022-02-10 NOTE — Progress Notes (Signed)
PROGRESS NOTE  Jennifer Pacheco EXB:[id number] DOB: [birth year]   PCP: [name], NP  Patient is from: Home.  Since she lives alone.  DOA: 01/30/2022 LOS: 6  Chief complaints Chief Complaint  Patient presents with   Hypotension     Brief Narrative / Interim history: 66 year old F with PMH of DM-2 and HTN presented to [facility] ED on 6/2 with AMS after she was found down altered in feces and urine by someone who went to check on her.  She reports difficulty getting up after fall on her left buttock.  Unclear how long she was down.  She was found to be hypoxic and hypotensive.  She was started on IV fluids, vasopressors, supplemental oxygen and broad-spectrum antibiotics.  CT chest showed submassive PE with RV/LV ratio 1.2-1.5.  She was started on heparin. CT abd and UA concerning for UTI w/ possible pyelonephritis. CXR unremarkable. 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 NSG consulted recommend MRI and decadron, and transfer to [facility].    Patient was in ICU since arrival at [facility].  She had ABLA likely due to retroperitoneal bleed.  Heparin was discontinued.  She had IVC filter placed.  She came off vasopressor on 6/5.  She was transferred to [facility] hospitalist service on 6/8.  She is currently stable on room air.  Neurosurgery reconsulted and recommended MRI brain with and without contrast.  Therapy recommended SNF.  Reportedly, APS involved in the case.  Palliative medicine following.  Subjective: Seen and examined earlier this morning.  No major events overnight of this morning.  No complaints.  Objective: Vitals:   02/09/22 2335 02/10/22 0400 02/10/22 0812 02/10/22 1140  BP: 127/83 (!) 96/51 128/76 139/87  Pulse: 74 80 68 68  Resp: '20 20 14 16  '$ Temp: 98.4 F (36.9 C) 98.2 F (36.8 C) 97.7 F (36.5 C) 98.1 F (36.7 C)  TempSrc: Oral Oral  Oral Oral  SpO2: 98% 99% 99% 99%  Weight:      Height:        Examination:  GENERAL: No apparent distress.  Nontoxic. HEENT: MMM.  Vision and hearing grossly intact.  NECK: Supple.  No apparent JVD.  RESP:  No IWOB.  Fair aeration bilaterally. CVS:  RRR. Heart sounds normal.  ABD/GI/GU: BS+. Abd soft, NTND.  MSK/EXT:  Moves extremities. No apparent deformity. No edema.  SKIN: no apparent skin lesion or wound NEURO: Awake and alert. Oriented appropriately.  Limited insight.  No apparent focal neuro deficit. PSYCH: Calm. Normal affect.   Procedures:  None  Microbiology summarized: [id number] and influenza PCR nonreactive. Blood cultures negative so far Urine culture with insignificant growth.  Assessment and Plan: Brain mass with vasogenic edema and mass effect CT and MRI brain showed 5.2 x 5.3 cm large right basal ganglia mass with surrounding edema and 11 mm left midline shift and near complete effacement of right lateral ventricle.  This is probably what led to her fall and altered mental status.  She was started on Decadron.  -Continue Decadron taper-orders in. -Palliative medicine following.  Seems APS has been involved as well -Unfortunately, none of the siblings visited yet to help with goal of care and medical decision -NS signed off 6/10 recommending reconsult when medically cleared for continued work-up -NS reconsulted 6/12 and recommended MRI brain with and without contrast-ordered.  Acute metabolic encephalopathy: She is oriented  x4 and aware of her diagnosis but limited insight Cognitive communication deficit-she had cognitive evaluation by SLP -Reorientation and delirium precautions -Minimize sedating medications -Continue SLP treatment  Physical deconditioning: lives alone. Found down on feces and urine before arrival.  Reportedly estranged from siblings. She has a friend visiting that she identifies as a boyfriend.  She is fairly oriented with fair awareness but  limited insight. [facility] contacted by [facility] to discuss initiating guardianship case for patient. Therapy recommends SNF.  Acute pulmonary embolism with acute cor pulmonale (HCC) Submassive PE with RV to LV ratio of 1.28-1.54.  TTE normal without evidence of right heart strain.  She was in shock on arrival.  She was started on IV heparin, which was later discontinued due to [facility] and retroperitoneal bleed.  She has been off IV heparin since  6/5.  She is currently on room air. -Start prophylactic dose subcu Lovenox.   Hypovolemic shock ([place]): Likely from poor p.o. intake and dehydration.  However, she could be in shock from PE and sepsis in the setting of possible UTI/pyelonephritis.  She has been off vasopressors since 6/5.  Now hemodynamically stable.  ABLA likely due to retroperitoneal bleed: Stable. Retroperitoneal hematoma: Due to [facility] for PE.  Heparin discontinued after IVC filter on 6/5.  Recent Labs    02/03/22 1849 02/04/22 0046 02/04/22 0648 02/04/22 1310 02/04/22 1944 02/05/22 0109 02/05/22 0658 02/06/22 0307 02/07/22 0311 02/09/22 0332  HGB 11.2* 9.5* 9.7* 9.2* 9.1* 8.3* 8.5* 8.7* 9.5* 9.4*  -Continue monitoring -Started prophylactic dose Lovenox on 6/12.  Controlled NIDDM-2: A1c 5.5%.  Not on medication at home. Recent Labs  Lab 02/09/22 1157 02/09/22 1701 02/09/22 2122 02/10/22 0612 02/10/22 1137  GLUCAP 115* 178* 155* 135* 113*  -Continue SSI  Distal esophageal thickening: noted on CT.  At [place] junction suspicious for neoplasm or severe esophagitis. Esophageal dysphagia GERD -On dysphagia 2 diet per SLP. -Continue PPI  Acute rhabdomyolysis, now resolved  Hyponatremia: Stable.  Continue monitoring  Lactic acidosis: Resolved.  Elevated troponin: Likely demand ischemia in the setting of right heart strain.    AKI: Likely prerenal.  Resolved.  Bandemia:Likely demargination from steroid.  Improved.  Continue monitoring.  Acute respiratory failure with  hypoxia:Resolved.  On room air.   Pressure skin injury: POA Pressure Injury 01/30/22 Sacrum Right;Medial Stage 2 -  Partial thickness loss of dermis presenting as a shallow open injury with a red, pink wound bed without slough. (Active)  01/30/22 2053  Location: Sacrum  Location Orientation: Right;Medial  Staging: Stage 2 -  Partial thickness loss of dermis presenting as a shallow open injury with a red, pink wound bed without slough.  Wound Description (Comments):   Present on Admission: Yes  Dressing Type Foam - Lift dressing to assess site every shift 02/10/22 0940     Pressure Injury 01/30/22 Coccyx Lower;Medial Stage 2 -  Partial thickness loss of dermis presenting as a shallow open injury with a red, pink wound bed without slough. (Active)  01/30/22 2053  Location: Coccyx  Location Orientation: Lower;Medial  Staging: Stage 2 -  Partial thickness loss of dermis presenting as a shallow open injury with a red, pink wound bed without slough.  Wound Description (Comments):   Present on Admission: Yes  Dressing Type Foam - Lift dressing to assess site every shift 02/10/22 0940   DVT prophylaxis:  enoxaparin (LOVENOX) injection 40 mg Start: 02/09/22 1800 Place and maintain sequential compression device Start: 02/02/22 1450  Code Status: Full code  Family Communication: None at bedside. Level of care: Med-Surg Status is: Inpatient Remains inpatient appropriate because: Evaluation for brain tumor and safe disposition   Final disposition: SNF Consultants:  Pulmonology admitted patient Neurosurgery Palliative medicine  Sch Meds:  Scheduled Meds:  dexamethasone  4 mg Oral Q12H   Followed by   Derrill Memo ON 02/14/2022] dexamethasone  4 mg Oral Daily   docusate sodium  100 mg Oral BID   enoxaparin (LOVENOX) injection  40 mg Subcutaneous Q24H   insulin aspart  0-9 Units Subcutaneous TID AC & HS   lisinopril  20 mg Oral Daily   pantoprazole  40 mg Oral BID   polyethylene glycol  17 g  Oral BID   senna  1 tablet Oral QHS   Continuous Infusions:  sodium chloride Stopped (02/02/22 0929)   PRN Meds:.acetaminophen, calcium carbonate, haloperidol lactate, iohexol, LORazepam, ondansetron (ZOFRAN) IV  Antimicrobials: Anti-infectives (From admission, onward)    Start     Dose/Rate Route Frequency Ordered Stop   02/03/22 1715  levofloxacin (LEVAQUIN) tablet 500 mg  Status:  Discontinued        500 mg Oral Daily 02/03/22 1627 02/05/22 1355   02/02/22 2045  levofloxacin (LEVAQUIN) IVPB 500 mg  Status:  Discontinued       Note to Pharmacy: Adjust as needed - switching to IV since unable to take PO   500 mg 100 mL/hr over 60 Minutes Intravenous Every 24 hours 02/02/22 1959 02/03/22 1627   02/01/22 1400  levofloxacin (LEVAQUIN) tablet 500 mg  Status:  Discontinued        500 mg Oral Daily 02/01/22 0911 02/02/22 1959   01/31/22 1000  vancomycin (VANCOREADY) IVPB 750 mg/150 mL  Status:  Discontinued        750 mg 150 mL/hr over 60 Minutes Intravenous Every 12 hours 01/31/22 0003 01/31/22 0848   01/31/22 0200  vancomycin (VANCOREADY) IVPB 1500 mg/300 mL  Status:  Discontinued        1,500 mg 150 mL/hr over 120 Minutes Intravenous Every 12 hours 01/30/22 1241 01/30/22 2348   01/30/22 2100  aztreonam (AZACTAM) 2 g in sodium chloride 0.9 % 100 mL IVPB  Status:  Discontinued        2 g 200 mL/hr over 30 Minutes Intravenous Every 8 hours 01/30/22 1240 02/01/22 0923   01/30/22 1245  aztreonam (AZACTAM) 2 g in sodium chloride 0.9 % 100 mL IVPB        2 g 200 mL/hr over 30 Minutes Intravenous  Once 01/30/22 1231 01/30/22 1408   01/30/22 1245  metroNIDAZOLE (FLAGYL) IVPB 500 mg        500 mg 100 mL/hr over 60 Minutes Intravenous  Once 01/30/22 1231 01/30/22 1408   01/30/22 1245  vancomycin (VANCOCIN) IVPB 1000 mg/200 mL premix  Status:  Discontinued        1,000 mg 200 mL/hr over 60 Minutes Intravenous  Once 01/30/22 1231 01/30/22 1239   01/30/22 1245  vancomycin (VANCOREADY) IVPB 2000  mg/400 mL        2,000 mg 200 mL/hr over 120 Minutes Intravenous  Once 01/30/22 1239 01/30/22 1607        I have personally reviewed the following labs and images: CBC: Recent Labs  Lab 02/04/22 0648 02/04/22 1310 02/04/22 1944 02/05/22 0109 02/05/22 0658 02/06/22 0307 02/07/22 0311 02/09/22 0332  WBC 26.0* 24.1* 25.1* 20.2* 20.6* 17.3* 17.1* 24.9*  NEUTROABS 23.9* 21.2* 20.8* 17.5* 17.6*  --   --   --  HGB 9.7* 9.2* 9.1* 8.3* 8.5* 8.7* 9.5* 9.4*  HCT 28.7* 25.8* 27.1* 23.9* 25.6* 25.7* 27.4* 29.5*  MCV 78.8* 77.7* 79.5* 79.4* 80.8 80.3 79.2* 82.4  PLT 153 160 158 145* 154 166 197 282   BMP &GFR Recent Labs  Lab 02/04/22 0648 02/05/22 0658 02/05/22 1516 02/07/22 0311 02/09/22 0332  NA 130* 130* 130* 130* 131*  K 4.1 3.7 3.9 3.6 4.0  CL 97* 96* 96* 99 99  CO2 '23 27 27 26 24  '$ GLUCOSE 149* 147* 117* 128* 155*  BUN 27* '20 17 9 16  '$ CREATININE 0.85 0.76 0.71 0.49 0.59  CALCIUM 7.8* 7.8* 7.7* 7.9* 8.1*  MG 2.1 2.0 2.0 1.9 2.1  PHOS  --   --  2.1* 2.7 2.6   Estimated Creatinine Clearance: 69.3 mL/min (by C-G formula based on SCr of 0.59 mg/dL). Liver & Pancreas: Recent Labs  Lab 02/04/22 0648 02/05/22 0658 02/05/22 1516 02/07/22 0311 02/09/22 0332  AST 21 19  --   --   --   ALT 26 26  --   --   --   ALKPHOS 36* 30*  --   --   --   BILITOT 0.7 0.6  --   --   --   PROT 5.0* 4.8*  --   --   --   ALBUMIN 2.4* 2.3* 2.4* 2.5* 2.4*   No results for input(s): "LIPASE", "AMYLASE" in the last 168 hours.  No results for input(s): "AMMONIA" in the last 168 hours. Diabetic: No results for input(s): "HGBA1C" in the last 72 hours. Recent Labs  Lab 02/09/22 1157 02/09/22 1701 02/09/22 2122 02/10/22 0612 02/10/22 1137  GLUCAP 115* 178* 155* 135* 113*   Cardiac Enzymes: No results for input(s): "CKTOTAL", "CKMB", "CKMBINDEX", "TROPONINI" in the last 168 hours.  No results for input(s): "PROBNP" in the last 8760 hours. Coagulation Profile: No results for  input(s): "INR", "PROTIME" in the last 168 hours.  Thyroid Function Tests: No results for input(s): "TSH", "T4TOTAL", "FREET4", "T3FREE", "THYROIDAB" in the last 72 hours. Lipid Profile: No results for input(s): "CHOL", "HDL", "LDLCALC", "TRIG", "CHOLHDL", "LDLDIRECT" in the last 72 hours. Anemia Panel: No results for input(s): "VITAMINB12", "FOLATE", "FERRITIN", "TIBC", "IRON", "RETICCTPCT" in the last 72 hours.  Urine analysis:    Component Value Date/Time   COLORURINE YELLOW 01/30/2022 1720   APPEARANCEUR CLEAR 01/30/2022 1720   LABSPEC 1.044 (H) 01/30/2022 1720   PHURINE 5.0 01/30/2022 1720   GLUCOSEU NEGATIVE 01/30/2022 1720   HGBUR SMALL (A) 01/30/2022 1720   BILIRUBINUR NEGATIVE 01/30/2022 1720   KETONESUR 20 (A) 01/30/2022 1720   PROTEINUR 30 (A) 01/30/2022 1720   NITRITE POSITIVE (A) 01/30/2022 1720   LEUKOCYTESUR SMALL (A) 01/30/2022 1720   Sepsis Labs: Invalid input(s): "PROCALCITONIN", "LACTICIDVEN"  Microbiology: No results found for this or any previous visit (from the past 240 hour(s)).   Radiology Studies: No results found.    Hudsyn Barich T. [place]  If 7PM-7AM, please contact night-coverage [email or web address] 02/10/2022, 2:20 PM

## 2022-02-10 NOTE — TOC Progression Note (Signed)
Transition of Care [facility]) - Progression Note    Patient Details  Name: Jennifer Pacheco MRN: [id number] Date of Birth: [birth year]  Transition of Care [facility] CM/SW [place], [place] Phone Number: 02/10/2022, 11:06 AM  Clinical Narrative:   CSW alerted by palliative NP that patient's family is not willing to take over decision making for her, needs a guardian to make decisions. CSW left a voicemail for Doreen Beam with [facility] to discuss initiating guardianship case for patient. CSW to follow.    Expected Discharge Plan: [place] Barriers to Discharge: Continued Medical Work up, SNF Pending bed offer  Expected Discharge Plan and Services Expected Discharge Plan: [place] In-house Referral: Clinical Social Work, Hospice / [place] Acute Care Choice: [place] Living arrangements for the past 2 months: Single Family Home                                       Social Determinants of Health (SDOH) Interventions    Readmission Risk Interventions     No data to display

## 2022-02-10 NOTE — Progress Notes (Signed)
Speech Language Pathology Treatment: Dysphagia;Cognitive-Linquistic  Patient Details Name: Jennifer Pacheco MRN: [id number] DOB: 1956/08/03 Today's Date: 02/10/2022 Time: 1152-1202 SLP Time Calculation (min) (ACUTE ONLY): 10 min  Assessment / Plan / Recommendation Clinical Impression  Pt remains very distractible and hyperverbose. She self-fed a cup of pudding rapidly but seemingly without difficulty. It took a lot more cueing to try to get her to eat anything more solid. She does ask for food, but she is requesting items not readily available (such as corn on the cob). Ultimately she did take one very small bite of graham cracker, but not enough to suggest that she is ready for diet advancement, particularly when mentation remains impaired, as her dysphagia was felt to be largely cognitive in nature.    HPI HPI: Patient is a 66 year old female presented to [facility] ED on 6/2 with AMS found down at home. Admitted and found to be hypoxic and hypotensive, CT chest showing submassive PE, CT abd and UA concerning for UTI w/ possible pyelonephritis. 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 PHMx: HTN, DMT2      SLP Plan  Continue with current plan of care      Recommendations for follow up therapy are one component of a multi-disciplinary discharge planning process, led by the attending physician.  Recommendations may be updated based on patient status, additional functional criteria and insurance authorization.    Recommendations  Diet recommendations: Dysphagia 2 (fine chop);Thin liquid Liquids provided via: Straw;Cup Medication Administration: Whole meds with liquid Supervision: Staff to assist with self feeding;Full supervision/cueing for compensatory strategies Compensations: Slow rate;Small sips/bites;Follow solids with  liquid Postural Changes and/or Swallow Maneuvers: Seated upright 90 degrees                Oral Care Recommendations: Oral care BID Follow Up Recommendations: Skilled nursing-short term rehab (<3 hours/day) Assistance recommended at discharge: Frequent or constant Supervision/Assistance SLP Visit Diagnosis: Cognitive communication deficit (W09.811) Plan: Continue with current plan of care           Osie Bond., M.A. [place] Office [phone]  Secure chat preferred   02/10/2022, 12:11 PM

## 2022-02-11 ENCOUNTER — Inpatient Hospital Stay (HOSPITAL_COMMUNITY): Payer: Medicaid Other

## 2022-02-11 ENCOUNTER — Other Ambulatory Visit: Payer: Self-pay | Admitting: Radiation Therapy

## 2022-02-11 DIAGNOSIS — D62 Acute posthemorrhagic anemia: Secondary | ICD-10-CM | POA: Diagnosis not present

## 2022-02-11 DIAGNOSIS — R410 Disorientation, unspecified: Secondary | ICD-10-CM | POA: Diagnosis not present

## 2022-02-11 DIAGNOSIS — I2609 Other pulmonary embolism with acute cor pulmonale: Secondary | ICD-10-CM | POA: Diagnosis not present

## 2022-02-11 DIAGNOSIS — R933 Abnormal findings on diagnostic imaging of other parts of digestive tract: Secondary | ICD-10-CM | POA: Diagnosis not present

## 2022-02-11 DIAGNOSIS — Z515 Encounter for palliative care: Secondary | ICD-10-CM | POA: Diagnosis not present

## 2022-02-11 DIAGNOSIS — G9389 Other specified disorders of brain: Secondary | ICD-10-CM | POA: Diagnosis not present

## 2022-02-11 DIAGNOSIS — G9341 Metabolic encephalopathy: Secondary | ICD-10-CM | POA: Diagnosis not present

## 2022-02-11 LAB — GLUCOSE, CAPILLARY
Glucose-Capillary: 155 mg/dL — ABNORMAL HIGH (ref 70–99)
Glucose-Capillary: 170 mg/dL — ABNORMAL HIGH (ref 70–99)
Glucose-Capillary: 135 mg/dL — ABNORMAL HIGH (ref 70–99)
Glucose-Capillary: 156 mg/dL — ABNORMAL HIGH (ref 70–99)

## 2022-02-11 LAB — CBC
HCT: 32.5 % — ABNORMAL LOW (ref 36.0–46.0)
Hemoglobin: 10.5 g/dL — ABNORMAL LOW (ref 12.0–15.0)
MCH: 26.3 pg (ref 26.0–34.0)
MCHC: 32.3 g/dL (ref 30.0–36.0)
MCV: 81.5 fL (ref 80.0–100.0)
Platelets: 359 10*3/uL (ref 150–400)
RBC: 3.99 MIL/uL (ref 3.87–5.11)
RDW: 23.7 % — ABNORMAL HIGH (ref 11.5–15.5)
WBC: 20.6 10*3/uL — ABNORMAL HIGH (ref 4.0–10.5)
nRBC: 0 % (ref 0.0–0.2)

## 2022-02-11 LAB — RENAL FUNCTION PANEL
Albumin: 2.8 g/dL — ABNORMAL LOW (ref 3.5–5.0)
Anion gap: 8 (ref 5–15)
BUN: 14 mg/dL (ref 8–23)
CO2: 24 mmol/L (ref 22–32)
Calcium: 8.7 mg/dL — ABNORMAL LOW (ref 8.9–10.3)
Chloride: 99 mmol/L (ref 98–111)
Creatinine, Ser: 0.59 mg/dL (ref 0.44–1.00)
GFR, Estimated: >60 mL/min (ref >60)
Glucose, Bld: 159 mg/dL — ABNORMAL HIGH (ref 70–99)
Phosphorus: 3.5 mg/dL (ref 2.5–4.6)
Potassium: 4.2 mmol/L (ref 3.5–5.1)
Sodium: 131 mmol/L — ABNORMAL LOW (ref 135–145)

## 2022-02-11 LAB — MAGNESIUM: Magnesium: 2.1 mg/dL (ref 1.7–2.4)

## 2022-02-11 IMAGING — MR MR HEAD WO/W CM
13 of 15 series · 40 of 48 positions shown · IV contrast (gadavist)
Comparison: None Available.

CLINICAL DATA: Provided history: Brain/CNS neoplasm, staging; brain
tumor.

EXAM:
MRI HEAD WITHOUT AND WITH CONTRAST
TECHNIQUE: Multiplanar, multiecho pulse sequences of the brain and surrounding
structures were obtained without and with intravenous contrast.
CONTRAST:  7mL GADAVIST GADOBUTROL 1 MMOL/ML IV SOLN

[Series 5: DWI · axial · 3.0mm · 0.88mm/px · z∈[-82,+58]mm · 6 of 96 slices shown (1 of 4)]
[im 1/96]
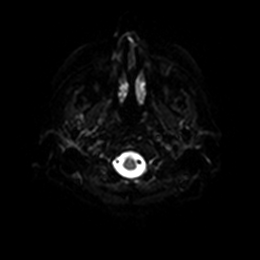
[im 20/96]
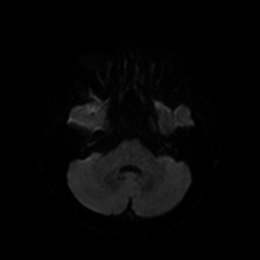
[im 39/96]
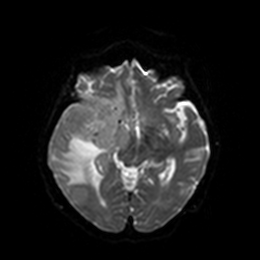
[im 58/96]
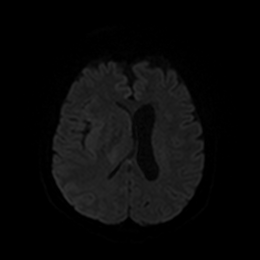
[im 77/96]
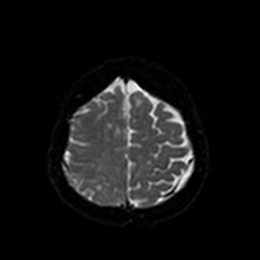
[im 96/96]
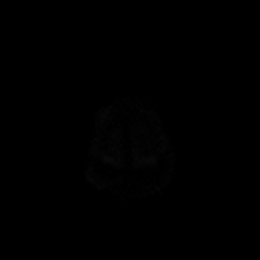

[Series 6: DWI · axial · 3.0mm · 0.88mm/px · z∈[-82,+58]mm · 3 of 48 slices shown (2 of 4)]
[im 1/48]
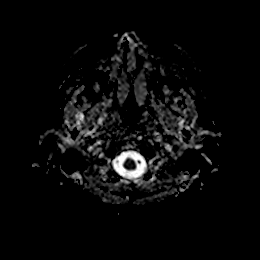
[im 24/48]
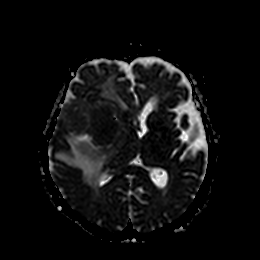
[im 48/48]
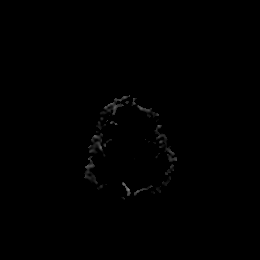

[Series 7: DWI · coronal · 4.0mm · 0.88mm/px · 5 of 64 slices shown (3 of 4)]
[im 1/64]
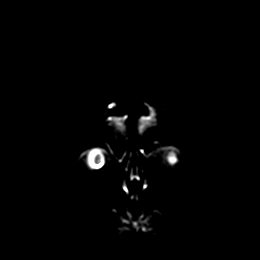
[im 16/64]
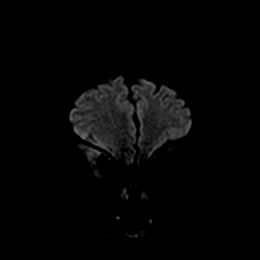
[im 32/64]
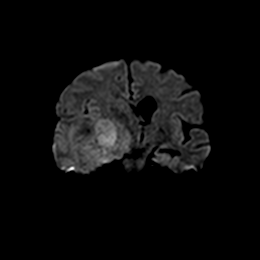
[im 48/64]
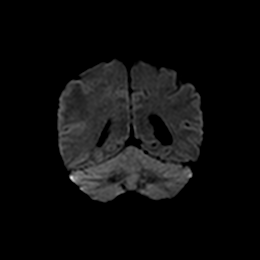
[im 64/64]
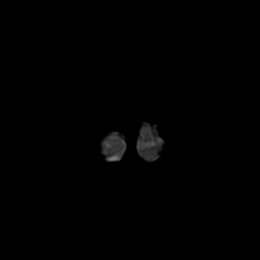

[Series 8: DWI · coronal · 4.0mm · 0.88mm/px · 2 of 32 slices shown (4 of 4)]
[im 1/32]
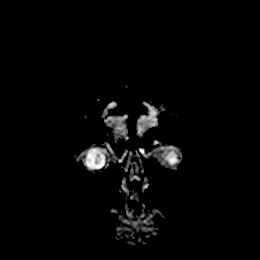
[im 32/32]
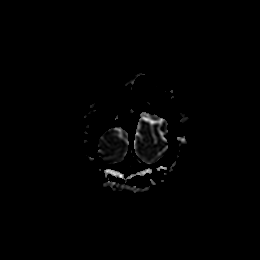

[Series 9: T1 · sagittal · 5.0mm · 0.75mm/px · 2 of 25 slices shown]
[im 1/25]
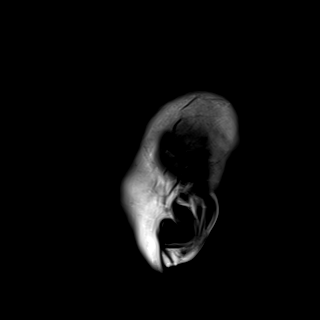
[im 25/25]
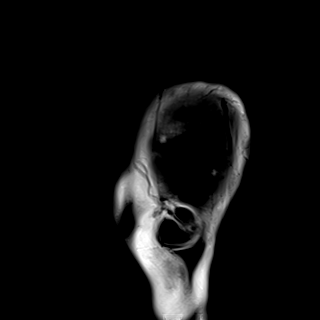

[Series 10: T2 · axial · 5.0mm · 0.72mm/px · z∈[-84,+59]mm · 2 of 25 slices shown]
[im 1/25]
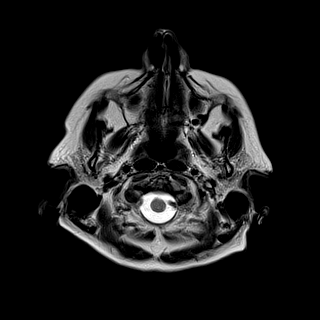
[im 25/25]
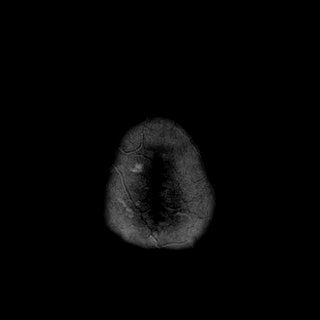

[Series 11: FLAIR · axial · 5.0mm · 0.45mm/px · z∈[-85,+58]mm · 2 of 25 slices shown]
[im 1/25]
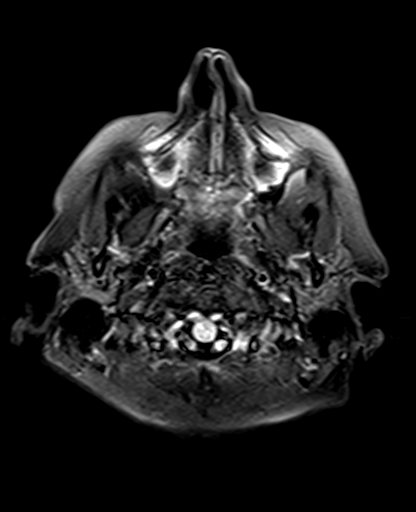
[im 25/25]
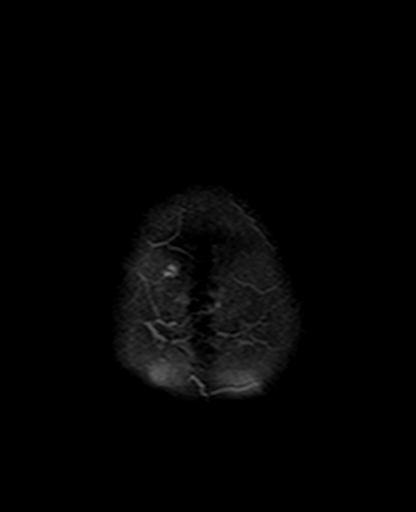

[Series 12: mag_images · axial · 3.0mm · 0.90mm/px · z∈[-89,+62]mm · 4 of 52 slices shown]
[im 1/52]
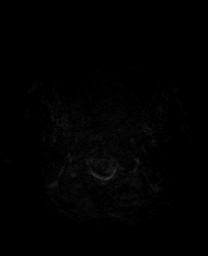
[im 18/52]
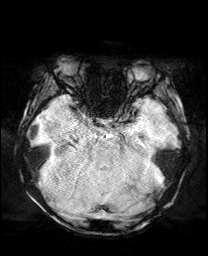
[im 35/52]
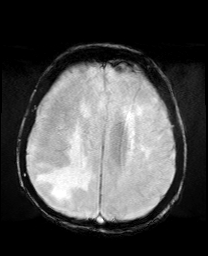
[im 52/52]
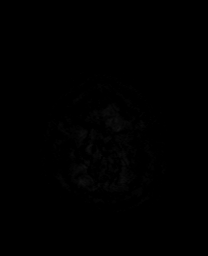

[Series 13: pha_images · axial · 3.0mm · 0.90mm/px · z∈[-89,+62]mm · 4 of 52 slices shown]
[im 1/52]
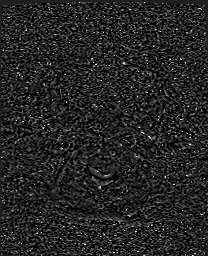
[im 18/52]
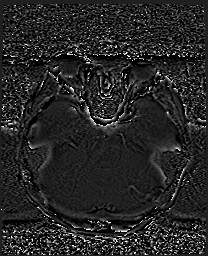
[im 35/52]
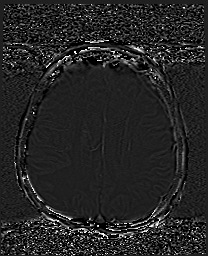
[im 52/52]
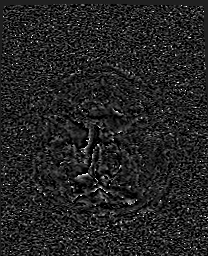

[Series 14: swi_images · axial · 3.0mm · 0.90mm/px · z∈[-89,+62]mm · 4 of 52 slices shown]
[im 1/52]
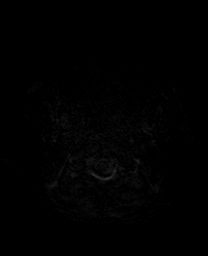
[im 18/52]
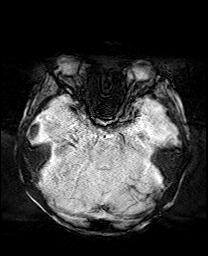
[im 35/52]
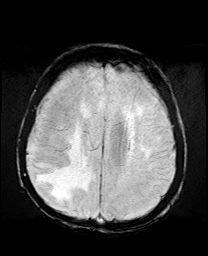
[im 52/52]
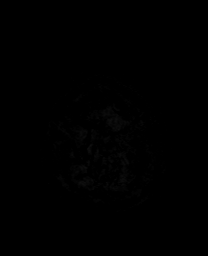

[Series 17: T2 post-contrast · coronal · 5.0mm · 0.72mm/px · 2 of 28 slices shown]
[im 1/28]
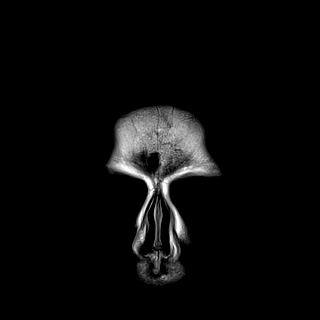
[im 28/28]
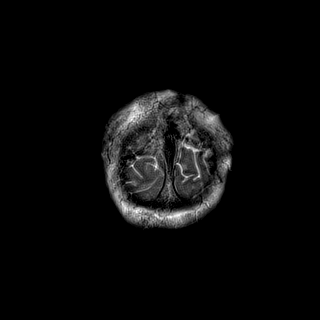

[Series 19: T1 post-contrast · coronal · 5.0mm · 0.34mm/px · 2 of 28 slices shown (1 of 2)]
[im 1/28]
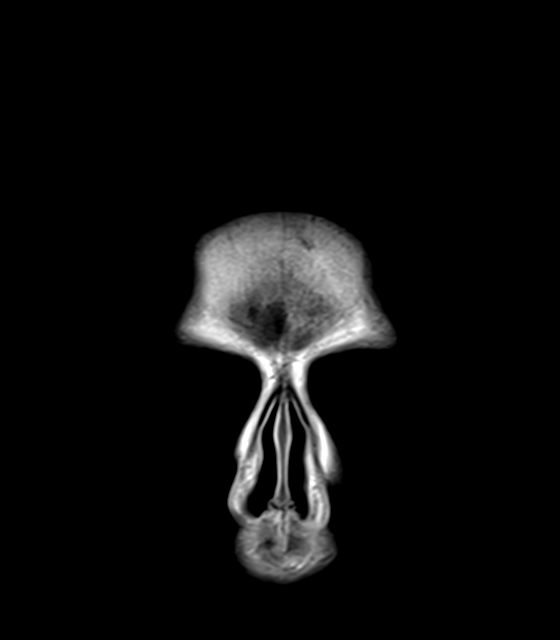
[im 28/28]
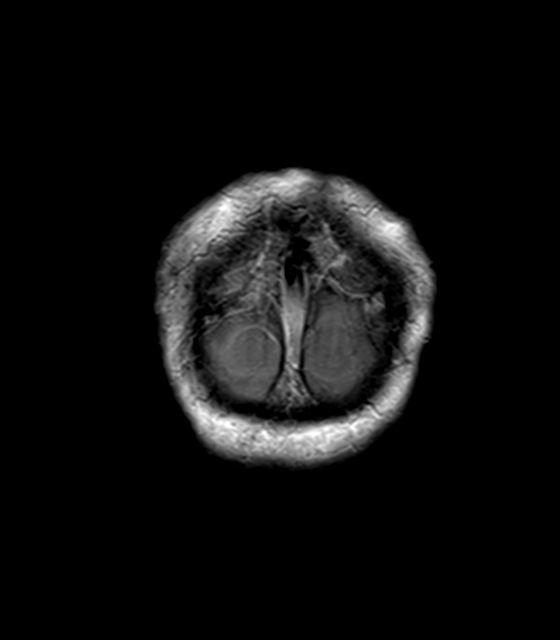

[Series 20: T1 post-contrast · sagittal · 5.0mm · 0.72mm/px · 2 of 25 slices shown (2 of 2)]
[im 1/25]
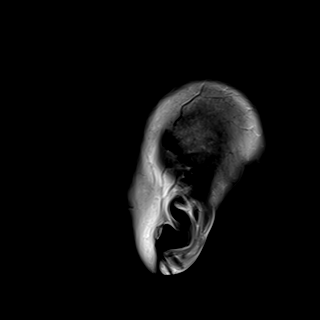
[im 25/25]
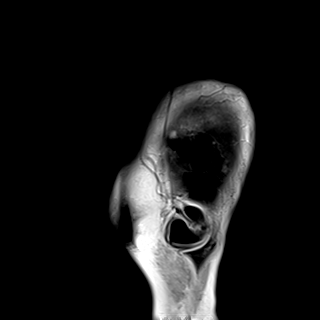

[40 of 48 positions shown; findings below may reference images not displayed]

FINDINGS: Brain:

Mild generalized cerebral atrophy.

Avidly enhancing mass within the right frontotemporal/peri-insular
region, measuring 5.9 x 5.4 x 5.9 cm. There is an apparent dural
tail suggesting an extra-axial origin. This is favored to reflect a
meningioma. As before, there is prominent vasogenic edema within the
right cerebral hemisphere. Unchanged mass effect with extensive
effacement of the right lateral ventricle and 11 mm leftward midline
shift measured at the level of the septum pellucidum. No definite
ventricular entrapment. Also unchanged, there is medial displacement
of the medial right temporal lobe with mass effect upon the right
midbrain (series 11, image 10).

Background moderate multifocal T2 FLAIR hyperintense signal
abnormality within the cerebral white matter, nonspecific but
compatible with chronic small vessel ischemic disease.

There is no acute infarct.

No chronic intracranial blood products.

No extra-axial fluid collection.

Vascular: Maintained flow voids within the proximal large arterial
vessels. The mass displaces and elevates right MCA branches.

Skull and upper cervical spine: No focal suspicious marrow lesion.

Sinuses/Orbits: No mass or acute finding within the imaged orbits.
Minimal mucosal thickening within a posterior right ethmoid air cell
and within the right sphenoid sinus.
IMPRESSION: 5.9 x 5.4 x 5.9 cm avidly enhancing mass in the right
frontotemporal/peri-insular region, as described. This is favored
extra-axial in origin and favored to reflect a meningioma. As
before, there is prominent surrounding vasogenic edema within the
right cerebral hemisphere. Unchanged mass effect with 11 mm leftward
midline shift. Also unchanged, there is medial displacement of the
medial right temporal lobe with mass effect upon the right midbrain.
Of note, the mass displaces and elevates right middle cerebral
artery branches.

Background moderate chronic small vessel image changes within the
cerebral white matter.

Mild generalized cerebral atrophy.

## 2022-02-11 MED ORDER — GADOBUTROL 1 MMOL/ML IV SOLN
7.0000 mL | Freq: Once | INTRAVENOUS | Status: AC | PRN
  Administered 2022-02-11: 7 mL via INTRAVENOUS

## 2022-02-11 NOTE — Progress Notes (Signed)
Report called to Margaretha Sheffield ,RN on unit 2 [place] .Patient to transfer to unit [place] bed 9.

## 2022-02-11 NOTE — Progress Notes (Signed)
This chaplain attempted a spiritual care visit per forwarded PMT phone request of Pt. Friend-Ken.    The chaplain understands Yvone Neu is requesting clergy member to visit because the Pt. has no family and friends. The caller is hopeful the Pt. will learn to trust the medical team that is trying to help her.  The Pt. is sleeping at the time of the chaplain visit. This chaplain will make a referral to the spiritual care team for a follow up.  Chaplain Sallyanne Kuster [phone]

## 2022-02-11 NOTE — Progress Notes (Signed)
Pt arrived to unit, assessed, all needs addressed

## 2022-02-11 NOTE — Plan of Care (Signed)
Left message for NS about MRI brain result

## 2022-02-11 NOTE — Progress Notes (Signed)
Patient ID: Jennifer Pacheco, female   DOB: [birth year], 66 y.o.   MRN: [id number]    Progress Note from the Palliative Medicine Team at [facility]   Patient Name: Jennifer Pacheco        Date: 02/11/2022 DOB: 1956-03-13  Age: 66 y.o. MRN#: [id number] Attending Physician: Mercy Riding, MD Primary Care Physician: [name], NP Admit Date: 01/30/2022   Medical records reviewed   66 y.o. female   admitted on 01/30/2022 with  PMH of HTN, DMT2 presents to [facility] ED on 6/2 with AMS.  Patient was found down on floor at home altered covered in feces and urine and someone checked on her today on 6/2.  EMS transported patient to [facility].   Unsure exactly how long she has been down.  Patient noted to be hypoxic and hypotensive. Afebrile. Started on IV fluids for hypotension. Sats improved on [place].    CT chest showing submassive PE with RV/LV ratio 1.2-1.5. Started on heparin. Broad spectrum abx started. CT abd and UA concerning for UTI w/ possible pyelonephritis. CXR unremarkable.    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 NSG consulted recommend MRI and decadron.   Retroperitoneal bleed, requiring transfusion.  I examined [name] at the bedside this morning.  She is alert, appetite is good however she remains confused and without insight or capacity for medical decisions at this time.  Transition of care team working with Adult Protective Services to secure guardianship.  Adult Protective Services with [facility] involved       [place] Evans/LCSW with [place] [phone]    I returned a call to patient's friend Alease Frame, left message await callback.   PMT will continue to support holistically  Wadie Lessen NP  Palliative Medicine Team Team Phone # [phone] Pager [phone]

## 2022-02-11 NOTE — Progress Notes (Signed)
PROGRESS NOTE  Jennifer Pacheco WER:[id number] DOB: [birth year]   PCP: [name], NP  Patient is from: Home.  Since she lives alone.  DOA: 01/30/2022 LOS: 84  Chief complaints Chief Complaint  Patient presents with   Hypotension     Brief Narrative / Interim history: 66 year old F with PMH of DM-2 and HTN presented to [facility] ED on 6/2 with AMS after she was found down altered in feces and urine by someone who went to check on her.  She reports difficulty getting up after fall on her left buttock.  Unclear how long she was down.  She was found to be hypoxic and hypotensive.  She was started on IV fluids, vasopressors, supplemental oxygen and broad-spectrum antibiotics.  CT chest showed submassive PE with RV/LV ratio 1.2-1.5.  She was started on heparin. CT abd and UA concerning for UTI w/ possible pyelonephritis. CXR unremarkable. 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 NSG consulted recommend MRI and decadron, and transfer to [facility].    Patient was in ICU since arrival at [facility].  She had ABLA likely due to retroperitoneal bleed.  Heparin was discontinued.  She had IVC filter placed.  She came off vasopressor on 6/5.  She was transferred to [facility] hospitalist service on 6/8.  She is currently stable on room air.  Neurosurgery reconsulted and recommended MRI brain with and without contrast.  Therapy recommended SNF.  Reportedly, APS involved in the case.  Palliative medicine following.  Subjective: Seen and examined earlier this morning.  No major events overnight of this morning.  No complaints but anxious about the brain tumor.  Patient's friend at bedside.  Objective: Vitals:   02/10/22 1522 02/10/22 1938 02/10/22 2328 02/11/22 0058  BP: 118/74 133/79 123/81 137/88  Pulse: 79 72 68 68  Resp:  '17 18 18  '$ Temp: 98.6 F (37 C) 98.4 F (36.9 C)  (!) 97.5 F (36.4 C) 97.8 F (36.6 C)  TempSrc: Oral Oral Oral Oral  SpO2: 98% 98% 97% 97%  Weight:      Height:        Examination:  GENERAL: No apparent distress.  Nontoxic. HEENT: MMM.  Vision grossly intact.  Hard of hearing. NECK: Supple.  No apparent JVD.  RESP:  No IWOB.  Fair aeration bilaterally. CVS:  RRR. Heart sounds normal.  ABD/GI/GU: BS+. Abd soft, NTND.  MSK/EXT:  Moves extremities. No apparent deformity. No edema.  SKIN: no apparent skin lesion or wound NEURO: Awake and alert. Oriented appropriately.  No apparent focal neuro deficit. PSYCH: Calm. Normal affect.   Procedures:  None  Microbiology summarized: [id number] and influenza PCR nonreactive. Blood cultures negative so far Urine culture with insignificant growth.  Assessment and Plan: Brain mass with vasogenic edema and mass effect CT and MRI brain showed 5.2 x 5.3 cm large right basal ganglia mass with surrounding edema and 11 mm left midline shift and near complete effacement of right lateral ventricle.  This is probably what led to her fall and altered mental status.  She was started on Decadron.  -Continue Decadron taper-orders in. -Unfortunately, none of the siblings visited yet to help with goal of care and medical decision -Palliative medicine following.  APS notified. -NS signed off 6/10 recommending reconsult when medically cleared for continued work-up -NS reconsulted 6/12 and recommended MRI brain with and without contrast-ordered.  Acute metabolic  encephalopathy: She is oriented x4 and aware of her diagnosis but limited insight Cognitive communication deficit-she had cognitive evaluation by SLP -Reorientation and delirium precautions -Minimize sedating medications -Continue SLP treatment  Physical deconditioning: lives alone. Found down on feces and urine before arrival.  Reportedly estranged from siblings. She has a friend visiting that she identifies as a boyfriend.  She is fairly  oriented with fair awareness but limited insight. [place] APS contacted by [facility] to discuss initiating guardianship case for patient. Therapy recommends SNF.  Acute pulmonary embolism with acute cor pulmonale (HCC) Submassive PE with RV to LV ratio of 1.28-1.54.  TTE normal without evidence of right heart strain.  She was in shock on arrival.  She was started on IV heparin, which was later discontinued due to [facility] and retroperitoneal bleed.  She has been off IV heparin since  6/5.  She is currently on room air. -Started prophylactic dose Lovenox on 6/12.  H&H stable.  Hypovolemic shock ([place]): Likely from poor p.o. intake and dehydration.  However, she could be in shock from PE and sepsis in the setting of possible UTI/pyelonephritis.  She has been off vasopressors since 6/5.  Now hemodynamically stable.  ABLA likely due to retroperitoneal bleed: Stable. Retroperitoneal hematoma: Due to [facility] for PE.  Heparin discontinued after IVC filter on 6/5.  Recent Labs    02/04/22 0046 02/04/22 0648 02/04/22 1310 02/04/22 1944 02/05/22 0109 02/05/22 0658 02/06/22 0307 02/07/22 0311 02/09/22 0332 02/11/22 0324  HGB 9.5* 9.7* 9.2* 9.1* 8.3* 8.5* 8.7* 9.5* 9.4* 10.5*  -Continue monitoring -Started prophylactic dose Lovenox on 6/12.  Controlled NIDDM-2: A1c 5.5%.  Not on medication at home. Recent Labs  Lab 02/10/22 0612 02/10/22 1137 02/10/22 1643 02/10/22 2137 02/11/22 0742  GLUCAP 135* 113* 161* 141* 155*  -Continue SSI  Distal esophageal thickening: noted on CT.  At [place] junction suspicious for neoplasm or severe esophagitis. Esophageal dysphagia GERD -On dysphagia 2 diet per SLP. -Continue PPI  Acute rhabdomyolysis, now resolved  Hyponatremia: Stable.  Continue monitoring  Lactic acidosis: Resolved.  Elevated troponin: Likely demand ischemia in the setting of right heart strain from PE.    AKI: Likely prerenal.  Resolved.  Bandemia:Likely demargination from steroid.  Continue  monitoring  Acute respiratory failure with hypoxia:Resolved.  On room air.   Pressure skin injury: POA Pressure Injury 01/30/22 Sacrum Right;Medial Stage 2 -  Partial thickness loss of dermis presenting as a shallow open injury with a red, pink wound bed without slough. (Active)  01/30/22 2053  Location: Sacrum  Location Orientation: Right;Medial  Staging: Stage 2 -  Partial thickness loss of dermis presenting as a shallow open injury with a red, pink wound bed without slough.  Wound Description (Comments):   Present on Admission: Yes  Dressing Type Foam - Lift dressing to assess site every shift 02/11/22 0855     Pressure Injury 01/30/22 Coccyx Lower;Medial Stage 2 -  Partial thickness loss of dermis presenting as a shallow open injury with a red, pink wound bed without slough. (Active)  01/30/22 2053  Location: Coccyx  Location Orientation: Lower;Medial  Staging: Stage 2 -  Partial thickness loss of dermis presenting as a shallow open injury with a red, pink wound bed without slough.  Wound Description (Comments):   Present on Admission: Yes  Dressing Type Foam - Lift dressing to assess site every shift 02/11/22 0855   DVT prophylaxis:  enoxaparin (LOVENOX) injection 40 mg Start: 02/09/22 1800 Place and maintain sequential compression device Start:  02/02/22 1450  Code Status: Full code Family Communication: Updated patient's friend at bedside. Level of care: Med-Surg Status is: Inpatient Remains inpatient appropriate because: Evaluation for brain tumor and safe disposition   Final disposition: SNF Consultants:  Pulmonology admitted patient Neurosurgery Palliative medicine  Sch Meds:  Scheduled Meds:  dexamethasone  4 mg Oral Q12H   Followed by   Derrill Memo ON 02/14/2022] dexamethasone  4 mg Oral Daily   docusate sodium  100 mg Oral BID   enoxaparin (LOVENOX) injection  40 mg Subcutaneous Q24H   insulin aspart  0-9 Units Subcutaneous TID AC & HS   lisinopril  20 mg Oral  Daily   pantoprazole  40 mg Oral BID   polyethylene glycol  17 g Oral BID   senna  1 tablet Oral QHS   Continuous Infusions:  sodium chloride Stopped (02/02/22 0929)   PRN Meds:.acetaminophen, calcium carbonate, haloperidol lactate, iohexol, LORazepam, ondansetron (ZOFRAN) IV  Antimicrobials: Anti-infectives (From admission, onward)    Start     Dose/Rate Route Frequency Ordered Stop   02/03/22 1715  levofloxacin (LEVAQUIN) tablet 500 mg  Status:  Discontinued        500 mg Oral Daily 02/03/22 1627 02/05/22 1355   02/02/22 2045  levofloxacin (LEVAQUIN) IVPB 500 mg  Status:  Discontinued       Note to Pharmacy: Adjust as needed - switching to IV since unable to take PO   500 mg 100 mL/hr over 60 Minutes Intravenous Every 24 hours 02/02/22 1959 02/03/22 1627   02/01/22 1400  levofloxacin (LEVAQUIN) tablet 500 mg  Status:  Discontinued        500 mg Oral Daily 02/01/22 0911 02/02/22 1959   01/31/22 1000  vancomycin (VANCOREADY) IVPB 750 mg/150 mL  Status:  Discontinued        750 mg 150 mL/hr over 60 Minutes Intravenous Every 12 hours 01/31/22 0003 01/31/22 0848   01/31/22 0200  vancomycin (VANCOREADY) IVPB 1500 mg/300 mL  Status:  Discontinued        1,500 mg 150 mL/hr over 120 Minutes Intravenous Every 12 hours 01/30/22 1241 01/30/22 2348   01/30/22 2100  aztreonam (AZACTAM) 2 g in sodium chloride 0.9 % 100 mL IVPB  Status:  Discontinued        2 g 200 mL/hr over 30 Minutes Intravenous Every 8 hours 01/30/22 1240 02/01/22 0923   01/30/22 1245  aztreonam (AZACTAM) 2 g in sodium chloride 0.9 % 100 mL IVPB        2 g 200 mL/hr over 30 Minutes Intravenous  Once 01/30/22 1231 01/30/22 1408   01/30/22 1245  metroNIDAZOLE (FLAGYL) IVPB 500 mg        500 mg 100 mL/hr over 60 Minutes Intravenous  Once 01/30/22 1231 01/30/22 1408   01/30/22 1245  vancomycin (VANCOCIN) IVPB 1000 mg/200 mL premix  Status:  Discontinued        1,000 mg 200 mL/hr over 60 Minutes Intravenous  Once 01/30/22  1231 01/30/22 1239   01/30/22 1245  vancomycin (VANCOREADY) IVPB 2000 mg/400 mL        2,000 mg 200 mL/hr over 120 Minutes Intravenous  Once 01/30/22 1239 01/30/22 1607        I have personally reviewed the following labs and images: CBC: Recent Labs  Lab 02/04/22 1310 02/04/22 1944 02/05/22 0109 02/05/22 0658 02/06/22 0307 02/07/22 0311 02/09/22 0332 02/11/22 0324  WBC 24.1* 25.1* 20.2* 20.6* 17.3* 17.1* 24.9* 20.6*  NEUTROABS 21.2* 20.8* 17.5* 17.6*  --   --   --   --  HGB 9.2* 9.1* 8.3* 8.5* 8.7* 9.5* 9.4* 10.5*  HCT 25.8* 27.1* 23.9* 25.6* 25.7* 27.4* 29.5* 32.5*  MCV 77.7* 79.5* 79.4* 80.8 80.3 79.2* 82.4 81.5  PLT 160 158 145* 154 166 197 282 359   BMP &GFR Recent Labs  Lab 02/05/22 0658 02/05/22 1516 02/07/22 0311 02/09/22 0332 02/11/22 0324  NA 130* 130* 130* 131* 131*  K 3.7 3.9 3.6 4.0 4.2  CL 96* 96* 99 99 99  CO2 '27 27 26 24 24  '$ GLUCOSE 147* 117* 128* 155* 159*  BUN '20 17 9 16 14  '$ CREATININE 0.76 0.71 0.49 0.59 0.59  CALCIUM 7.8* 7.7* 7.9* 8.1* 8.7*  MG 2.0 2.0 1.9 2.1 2.1  PHOS  --  2.1* 2.7 2.6 3.5   Estimated Creatinine Clearance: 69.3 mL/min (by C-G formula based on SCr of 0.59 mg/dL). Liver & Pancreas: Recent Labs  Lab 02/05/22 0658 02/05/22 1516 02/07/22 0311 02/09/22 0332 02/11/22 0324  AST 19  --   --   --   --   ALT 26  --   --   --   --   ALKPHOS 30*  --   --   --   --   BILITOT 0.6  --   --   --   --   PROT 4.8*  --   --   --   --   ALBUMIN 2.3* 2.4* 2.5* 2.4* 2.8*   No results for input(s): "LIPASE", "AMYLASE" in the last 168 hours.  No results for input(s): "AMMONIA" in the last 168 hours. Diabetic: No results for input(s): "HGBA1C" in the last 72 hours. Recent Labs  Lab 02/10/22 0612 02/10/22 1137 02/10/22 1643 02/10/22 2137 02/11/22 0742  GLUCAP 135* 113* 161* 141* 155*   Cardiac Enzymes: No results for input(s): "CKTOTAL", "CKMB", "CKMBINDEX", "TROPONINI" in the last 168 hours.  No results for input(s):  "PROBNP" in the last 8760 hours. Coagulation Profile: No results for input(s): "INR", "PROTIME" in the last 168 hours.  Thyroid Function Tests: No results for input(s): "TSH", "T4TOTAL", "FREET4", "T3FREE", "THYROIDAB" in the last 72 hours. Lipid Profile: No results for input(s): "CHOL", "HDL", "LDLCALC", "TRIG", "CHOLHDL", "LDLDIRECT" in the last 72 hours. Anemia Panel: No results for input(s): "VITAMINB12", "FOLATE", "FERRITIN", "TIBC", "IRON", "RETICCTPCT" in the last 72 hours.  Urine analysis:    Component Value Date/Time   COLORURINE YELLOW 01/30/2022 1720   APPEARANCEUR CLEAR 01/30/2022 1720   LABSPEC 1.044 (H) 01/30/2022 1720   PHURINE 5.0 01/30/2022 1720   GLUCOSEU NEGATIVE 01/30/2022 1720   HGBUR SMALL (A) 01/30/2022 1720   BILIRUBINUR NEGATIVE 01/30/2022 1720   KETONESUR 20 (A) 01/30/2022 1720   PROTEINUR 30 (A) 01/30/2022 1720   NITRITE POSITIVE (A) 01/30/2022 1720   LEUKOCYTESUR SMALL (A) 01/30/2022 1720   Sepsis Labs: Invalid input(s): "PROCALCITONIN", "LACTICIDVEN"  Microbiology: No results found for this or any previous visit (from the past 240 hour(s)).   Radiology Studies: No results found.    Irean Kendricks T. [place]  If 7PM-7AM, please contact night-coverage [email or web address] 02/11/2022, 12:05 PM

## 2022-02-11 NOTE — Plan of Care (Signed)
Problem: Coping: Goal: Ability to adjust to condition or change in health will improve 02/11/2022 2325 by Matt Holmes, RN Outcome: Progressing 02/11/2022 2324 by Matt Holmes, RN Outcome: Progressing 02/11/2022 2324 by Matt Holmes, RN Outcome: Progressing   Problem: Fluid Volume: Goal: Ability to maintain a balanced intake and output will improve Outcome: Progressing   Problem: Metabolic: Goal: Ability to maintain appropriate glucose levels will improve Outcome: Progressing   Problem: Nutritional: Goal: Maintenance of adequate nutrition will improve 02/11/2022 2325 by Matt Holmes, RN Outcome: Progressing 02/11/2022 2324 by Matt Holmes, RN Outcome: Progressing Goal: Progress toward achieving an optimal weight will improve 02/11/2022 2325 by Matt Holmes, RN Outcome: Progressing 02/11/2022 2324 by Matt Holmes, RN Outcome: Progressing   Problem: Skin Integrity: Goal: Risk for impaired skin integrity will decrease Outcome: Progressing   Problem: Tissue Perfusion: Goal: Adequacy of tissue perfusion will improve Outcome: Progressing   Problem: Education: Goal: Knowledge of General Education information will improve Description: Including pain rating scale, medication(s)/side effects and non-pharmacologic comfort measures 02/11/2022 2326 by Matt Holmes, RN Outcome: Progressing 02/11/2022 2325 by Matt Holmes, RN Outcome: Not Progressing 02/11/2022 2324 by Matt Holmes, RN Outcome: Not Progressing 02/11/2022 2324 by Matt Holmes, RN Outcome: Progressing   Problem: Clinical Measurements: Goal: Ability to maintain clinical measurements within normal limits will improve Outcome: Progressing Goal: Will remain free from infection Outcome: Progressing Goal: Diagnostic test results will improve Outcome: Progressing Goal: Respiratory complications will improve Outcome: Progressing Goal: Cardiovascular complication will be avoided Outcome:  Progressing   Problem: Activity: Goal: Risk for activity intolerance will decrease Outcome: Progressing   Problem: Nutrition: Goal: Adequate nutrition will be maintained Outcome: Progressing   Problem: Coping: Goal: Level of anxiety will decrease Outcome: Progressing   Problem: Elimination: Goal: Will not experience complications related to bowel motility 02/11/2022 2324 by Matt Holmes, RN Outcome: Progressing 02/11/2022 2324 by Matt Holmes, RN Outcome: Progressing Goal: Will not experience complications related to urinary retention 02/11/2022 2324 by Matt Holmes, RN Outcome: Progressing 02/11/2022 2324 by Matt Holmes, RN Outcome: Progressing   Problem: Pain Managment: Goal: General experience of comfort will improve 02/11/2022 2326 by Matt Holmes, RN Outcome: Progressing 02/11/2022 2324 by Matt Holmes, RN Outcome: Progressing 02/11/2022 2324 by Matt Holmes, RN Outcome: Progressing   Problem: Safety: Goal: Ability to remain free from injury will improve 02/11/2022 2324 by Matt Holmes, RN Outcome: Progressing 02/11/2022 2324 by Matt Holmes, RN Outcome: Progressing   Problem: Skin Integrity: Goal: Risk for impaired skin integrity will decrease 02/11/2022 2324 by Matt Holmes, RN Outcome: Progressing 02/11/2022 2324 by Matt Holmes, RN Outcome: Progressing   Problem: Education: Goal: Understanding of CV disease, CV risk reduction, and recovery process will improve 02/11/2022 2324 by Matt Holmes, RN Outcome: Progressing 02/11/2022 2324 by Matt Holmes, RN Outcome: Progressing Goal: Individualized Educational Video(s) 02/11/2022 2324 by Matt Holmes, RN Outcome: Progressing 02/11/2022 2324 by Matt Holmes, RN Outcome: Progressing   Problem: Activity: Goal: Ability to return to baseline activity level will improve 02/11/2022 2324 by Matt Holmes, RN Outcome: Progressing 02/11/2022 2324 by Matt Holmes, RN Outcome:  Progressing   Problem: Cardiovascular: Goal: Ability to achieve and maintain adequate cardiovascular perfusion will improve 02/11/2022 2324 by Matt Holmes, RN Outcome: Progressing 02/11/2022 2324 by Matt Holmes, RN Outcome: Progressing Goal: Vascular access site(s) Level 0-1 will be maintained 02/11/2022 2324 by Matt Holmes, RN Outcome: Progressing 02/11/2022 2324 by Matt Holmes, RN Outcome: Progressing   Problem: Health Behavior/Discharge Planning: Goal: Ability to safely manage health-related needs after discharge will  improve 02/11/2022 2324 by Matt Holmes, RN Outcome: Progressing 02/11/2022 2324 by Matt Holmes, RN Outcome: Progressing

## 2022-02-11 NOTE — TOC Progression Note (Signed)
Transition of Care [facility]) - Progression Note    Patient Details  Name: Jennifer Pacheco MRN: [id number] Date of Birth: 01-06-1956  Transition of Care [facility]) CM/SW [place], RN Phone Number: 02/11/2022, 2:16 PM  Clinical Narrative:    CM met with the patient at the bedside along with the patient's friend, Estanislado Pandy.  Dr. Cyndia Skeeters, attending physician was present at the bedside and the patient was able to answer physician's questions appropriately at this time.  The patient is currently waiting on ordered MRI of head to evaluate the "mass In her brain".  Baumgardner states that Manti was called by him recently since the patient's home was infested with bed bugs and the patient was found down on the floor for days before he discovered her at the home alone.  The patient's friend states that the patient has been estranged from her family for years and that he has been a long time friend for the past 70 years but is unable to make medical decisions for her since he has a sick wife as well and lives in [place], [place].  The patient's friend, Yvone Neu states that he is aware that APS is involved with this patient and has given a brief history to the patient's [place] [facility] - [phone].  I called and spoke with Amalia Hailey, APS on the phone and updated him that the patient will need medical guardianship since the patient lacks capacity to make medical decisions per Dr. Cyndia Skeeters.  Clinicals were sent to [place], APS - including progress notes, recent Cognitive eval by speech, face-sheet, and palliative care notes.  Clinicals were sent to [email or web address].  The patient will likely need SNF placement but guardianship will need to be obtained since the patient currently does not have capacity to make medical decisions on her own behalf.   Expected Discharge Plan: (P) Skilled Nursing Facility Barriers to Discharge: (P) Continued Medical  Work up, Unsafe home situation, Family Issues  Expected Discharge Plan and Services Expected Discharge Plan: (P) [place] In-house Referral: Clinical Social Work, Hospice / [place] Acute Care Choice: [place] Living arrangements for the past 2 months: Single Family Home                                       Social Determinants of Health (SDOH) Interventions    Readmission Risk Interventions    02/11/2022    2:16 PM  Readmission Risk Prevention Plan  Transportation Screening Complete  PCP or Specialist Appt within 5-7 Days Complete  Home Care Screening Complete  Medication Review (RN CM) Complete

## 2022-02-11 NOTE — Progress Notes (Signed)
Physical Therapy Treatment Patient Details Name: Jennifer Pacheco MRN: [id number] DOB: 1956/05/17 Today's Date: 02/11/2022   History of Present Illness Pt is 66 yo female who was found down in her home and taken to [facility]. 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 Pt with retroperitoneal bleed and IVC filter placed 6/5. PMH: DM2, HTN.    PT Comments    Pt received in supine, agreeable to therapy session with max encouragement, pt self-limiting due to confusion and needing constant reorientation to task/situation. Pt needing up to modA for transfers and small pivotal steps at bedside with RW support, more due to pt confusion and impulsivity. Pt needing increased time/cues to initiate and perform all tasks. Pt up in recliner with posey lap belt alarm donned at end of session, pt visitor Yvone Neu and Teacher, adult education to enter room. Pt continues to benefit from PT services to progress toward functional mobility goals.   Recommendations for follow up therapy are one component of a multi-disciplinary discharge planning process, led by the attending physician.  Recommendations may be updated based on patient status, additional functional criteria and insurance authorization.  Follow Up Recommendations  Skilled nursing-short term rehab (<3 hours/day)     Assistance Recommended at Discharge Frequent or constant Supervision/Assistance  Patient can return home with the following A lot of help with walking and/or transfers;A little help with bathing/dressing/bathroom;Assistance with cooking/housework;Direct supervision/assist for medications management;Direct supervision/assist for financial management;Assist for transportation;Help with stairs or ramp for entrance   Equipment Recommendations  Other (comment)    Recommendations for Other Services       Precautions / Restrictions Precautions Precautions: Fall Precaution Comments: HOH; L side  inattention Restrictions Weight Bearing Restrictions: No     Mobility  Bed Mobility Overal bed mobility: Needs Assistance Bed Mobility: Rolling, Sidelying to Sit Rolling: Min assist Sidelying to sit: Min assist       General bed mobility comments: from flat bed, pt needing increased time/encouragement to initiate due to cognition and redirection to task    Transfers Overall transfer level: Needs assistance Equipment used: Rolling walker (2 wheels) Transfers: Sit to/from Stand, Bed to chair/wheelchair/BSC Sit to Stand: Mod assist, Min assist   Step pivot transfers: Mod assist       General transfer comment: pt needing max encouragement to initiate/perform transfer, hand over hand assist to push from bed rather than pulling up on RW; pt impulsive to keep reaching up to for RW; x2 reps from EOB    Ambulation/Gait Ambulation/Gait assistance: Min/Mod assist Gait Distance (Feet): 4 Feet Assistive device: Rolling walker (2 wheels) Gait Pattern/deviations: Shuffle, Trunk flexed, Step-to pattern       General Gait Details: small shuffled steps toward her R side to chair, pt refusing to walk further, c/o fatigue. Visitor entering room to visit with pt and social worker also arriving.      Balance Overall balance assessment: Needs assistance Sitting-balance support: Bilateral upper extremity supported, Feet supported Sitting balance-Leahy Scale: Fair Sitting balance - Comments: fatigues quickly in unsupported sitting   Standing balance support: Bilateral upper extremity supported, Reliant on assistive device for balance Standing balance-Leahy Scale: Poor Standing balance comment: reliant on external support                            Cognition Arousal/Alertness: Awake/alert Behavior During Therapy: Impulsive Overall Cognitive Status: Impaired/Different from baseline Area of Impairment: Attention, Memory, Following commands,  Safety/judgement, Awareness, Problem  solving, Orientation                 Orientation Level: Disoriented to, Time, Situation Current Attention Level: Focused Memory: Decreased recall of precautions, Decreased short-term memory Following Commands: Follows one step commands inconsistently Safety/Judgement: Decreased awareness of safety, Decreased awareness of deficits Awareness: Intellectual Problem Solving: Difficulty sequencing, Requires verbal cues, Requires tactile cues General Comments: pt tangential, hyperverbal during session, requires frequent redirection, requires increased time between tasks; decreased initiation and c/o fatigue, but able to progress with max encouragement.        Exercises      General Comments General comments (skin integrity, edema, etc.): SpO2 94% on RA, HR 59 bpm sitting EOB, no dizziness reported      Pertinent Vitals/Pain Pain Assessment Pain Assessment: No/denies pain Pain Intervention(s): Monitored during session, Repositioned     PT Goals (current goals can now be found in the care plan section) Acute Rehab PT Goals Patient Stated Goal: to rest; pt unable to state therapy-related goal 2/2 cognition PT Goal Formulation: Patient unable to participate in goal setting Time For Goal Achievement: 02/15/22 Progress towards PT goals: Progressing toward goals    Frequency    Min 3X/week      PT Plan Current plan remains appropriate       AM-PAC PT "6 Clicks" Mobility   Outcome Measure  Help needed turning from your back to your side while in a flat bed without using bedrails?: A Lot Help needed moving from lying on your back to sitting on the side of a flat bed without using bedrails?: A Little Help needed moving to and from a bed to a chair (including a wheelchair)?: A Lot (mod cues) Help needed standing up from a chair using your arms (e.g., wheelchair or bedside chair)?: A Lot Help needed to walk in hospital room?: Total Help needed climbing 3-5 steps with a  railing? : Total 6 Click Score: 11    End of Session Equipment Utilized During Treatment: Gait belt Activity Tolerance: Patient tolerated treatment well;Patient limited by fatigue (self-limiting due to AMS/c/o fatigue) Patient left: in chair;with call bell/phone within reach;with chair alarm set;Other (comment) (lap belt alarm donned) Nurse Communication: Mobility status;Other (comment) (lap belt alarm on, visitor recently arrived) PT Visit Diagnosis: Unsteadiness on feet (R26.81);Difficulty in walking, not elsewhere classified (R26.2)     Time: 5449-2010 PT Time Calculation (min) (ACUTE ONLY): 24 min  Charges:  $Therapeutic Activity: 23-37 mins                     Jewett Mcgann P., PTA Acute Rehabilitation Services Secure Chat Preferred 9a-5:30pm Office: Wing 02/11/2022, 11:36 AM

## 2022-02-12 DIAGNOSIS — I2609 Other pulmonary embolism with acute cor pulmonale: Secondary | ICD-10-CM | POA: Diagnosis not present

## 2022-02-12 DIAGNOSIS — R933 Abnormal findings on diagnostic imaging of other parts of digestive tract: Secondary | ICD-10-CM | POA: Diagnosis not present

## 2022-02-12 DIAGNOSIS — G9341 Metabolic encephalopathy: Secondary | ICD-10-CM | POA: Diagnosis not present

## 2022-02-12 DIAGNOSIS — D62 Acute posthemorrhagic anemia: Secondary | ICD-10-CM | POA: Diagnosis not present

## 2022-02-12 LAB — GLUCOSE, CAPILLARY
Glucose-Capillary: 140 mg/dL — ABNORMAL HIGH (ref 70–99)
Glucose-Capillary: 103 mg/dL — ABNORMAL HIGH (ref 70–99)
Glucose-Capillary: 173 mg/dL — ABNORMAL HIGH (ref 70–99)
Glucose-Capillary: 165 mg/dL — ABNORMAL HIGH (ref 70–99)

## 2022-02-12 NOTE — Progress Notes (Signed)
Occupational Therapy Treatment Patient Details Name: Jennifer Pacheco MRN: [id number] DOB: [birth year] Today's Date: 02/12/2022   History of present illness Pt is 66 yo female who was found down in her home and taken to [facility]. 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 Pt with retroperitoneal bleed and IVC filter placed 6/5. PMH: DM2, HTN.   OT comments  Patient received in bed and agreeable to OT. Patient required increased time to initiate and stated she didn't like to be rushed. Patient attempted to get to EOB without assistance but required min assist to raise trunk.  Patient able to donn socks seated on EOB without assistance and transferred to recliner with RW and min assist. Patient making good progress but continues to have safety issues. Patient left in recliner with chair alarm and posey belt. Acute OT to continue to follow.    Recommendations for follow up therapy are one component of a multi-disciplinary discharge planning process, led by the attending physician.  Recommendations may be updated based on patient status, additional functional criteria and insurance authorization.    Follow Up Recommendations  Skilled nursing-short term rehab (<3 hours/day)    Assistance Recommended at Discharge Frequent or constant Supervision/Assistance  Patient can return home with the following  A little help with walking and/or transfers;A little help with bathing/dressing/bathroom;Assistance with cooking/housework;Direct supervision/assist for medications management;Direct supervision/assist for financial management   Equipment Recommendations  None recommended by OT;Other (comment) (TBD)    Recommendations for Other Services      Precautions / Restrictions Precautions Precautions: Fall Precaution Comments: HOH; L side inattention Restrictions Weight Bearing Restrictions: No       Mobility Bed Mobility Overal bed mobility: Needs  Assistance Bed Mobility: Rolling, Sidelying to Sit Rolling: Min guard Sidelying to sit: Min assist       General bed mobility comments: declined assistance at first but was willing to accept once unable to get to EOB    Transfers Overall transfer level: Needs assistance Equipment used: Rolling walker (2 wheels) Transfers: Sit to/from Stand, Bed to chair/wheelchair/BSC Sit to Stand: Min assist     Step pivot transfers: Min assist     General transfer comment: min assist to power up and for balance when standing     Balance Overall balance assessment: Needs assistance Sitting-balance support: Bilateral upper extremity supported, Feet supported Sitting balance-Leahy Scale: Fair Sitting balance - Comments: supervision for sitting balance   Standing balance support: Bilateral upper extremity supported, Reliant on assistive device for balance Standing balance-Leahy Scale: Poor Standing balance comment: reliant on external support                           ADL either performed or assessed with clinical judgement   ADL Overall ADL's : Needs assistance/impaired     Grooming: Wash/dry hands;Wash/dry face;Supervision/safety;Sitting Grooming Details (indicate cue type and reason): in recliner             Lower Body Dressing: Supervision/safety;Sitting/lateral leans Lower Body Dressing Details (indicate cue type and reason): donned socks seated on EOB               General ADL Comments: declined further self care tasks, "I'll do it later"    Extremity/Trunk Assessment              Vision       Perception     Praxis      Cognition  Arousal/Alertness: Awake/alert Behavior During Therapy: Impulsive Overall Cognitive Status: Impaired/Different from baseline Area of Impairment: Attention, Memory, Following commands, Safety/judgement, Awareness, Problem solving, Orientation                 Orientation Level: Disoriented to, Time,  Situation Current Attention Level: Focused Memory: Decreased recall of precautions, Decreased short-term memory Following Commands: Follows one step commands inconsistently Safety/Judgement: Decreased awareness of safety, Decreased awareness of deficits Awareness: Intellectual Problem Solving: Difficulty sequencing, Requires verbal cues, Requires tactile cues General Comments: required verbal cues to stay on tasks, requires increased time, "I don't like to be rushed"        Exercises      Shoulder Instructions       General Comments      Pertinent Vitals/ Pain       Pain Assessment Pain Assessment: No/denies pain Pain Intervention(s): Monitored during session  Home Living                                          Prior Functioning/Environment              Frequency  Min 2X/week        Progress Toward Goals  OT Goals(current goals can now be found in the care plan section)  Progress towards OT goals: Progressing toward goals  Acute Rehab OT Goals Patient Stated Goal: get better OT Goal Formulation: With patient Time For Goal Achievement: 02/15/22 Potential to Achieve Goals: Good ADL Goals Pt Will Perform Grooming: Independently;standing Pt Will Perform Upper Body Bathing: Independently;sitting;standing Pt Will Perform Lower Body Bathing: Independently;sit to/from stand Pt Will Perform Upper Body Dressing: Independently;sitting;standing Pt Will Perform Lower Body Dressing: Independently;sit to/from stand Pt Will Transfer to Toilet: Independently;ambulating;bedside commode Pt Will Perform Toileting - Clothing Manipulation and hygiene: Independently;sit to/from stand Additional ADL Goal #1: Pt will be independent in and out of bed  Plan Discharge plan remains appropriate    Co-evaluation                 AM-PAC OT "6 Clicks" Daily Activity     Outcome Measure   Help from another person eating meals?: A Little Help from another  person taking care of personal grooming?: A Little Help from another person toileting, which includes using toliet, bedpan, or urinal?: A Little Help from another person bathing (including washing, rinsing, drying)?: A Little Help from another person to put on and taking off regular upper body clothing?: A Little Help from another person to put on and taking off regular lower body clothing?: A Little 6 Click Score: 18    End of Session Equipment Utilized During Treatment: Rolling walker (2 wheels);Gait belt  OT Visit Diagnosis: Unsteadiness on feet (R26.81);Other abnormalities of gait and mobility (R26.89);History of falling (Z91.81);Other symptoms and signs involving cognitive function   Activity Tolerance Patient tolerated treatment well   Patient Left in chair;with call bell/phone within reach;with chair alarm set;with restraints reapplied   Nurse Communication Mobility status        Time: 7654-6503 OT Time Calculation (min): 25 min  Charges: OT General Charges $OT Visit: 1 Visit OT Treatments $Self Care/Home Management : 8-22 mins $Therapeutic Activity: 8-22 mins  Lodema Hong, [place]  Pager [phone] Office [phone]   Trixie Dredge 02/12/2022, 9:14 AM

## 2022-02-12 NOTE — Progress Notes (Addendum)
MRI brain reviewed.  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  I explained these findings to the patient.  She verbalizes understanding but seems to remain confused without appropriate insight.  I will attempt to contact her friend and await definitive guardianship for further discussion.  Given the patient's significant medical comorbidities at this time, would ultimately recommend she follow-up in approximately 2 to 3 months for reevaluation and possible surgical planning.  I will place my information in the chart and continue to follow along peripherally.  Please do not hesitate to reach out with any questions or concerns.   Thank you for allowing me to participate in this patient's care.  Please do not hesitate to call with questions or concerns.   Elwin Sleight, [facility] Cell: [phone]

## 2022-02-12 NOTE — Progress Notes (Addendum)
PROGRESS NOTE  Jennifer Pacheco WUJ:[id number] DOB: 1955/11/27   PCP: [name], NP  Patient is from: Home.  Since she lives alone.  DOA: 01/30/2022 LOS: 56  Chief complaints Chief Complaint  Patient presents with   Hypotension     Brief Narrative / Interim history: 66 year old F with PMH of DM-2 and HTN presented to [facility] ED on 6/2 with AMS after she was found down altered in feces and urine by someone who went to check on her.  She reports difficulty getting up after fall on her left buttock.  Unclear how long she was down.  She was found to be hypoxic and hypotensive.  She was started on IV fluids, vasopressors, supplemental oxygen and broad-spectrum antibiotics.  CT chest showed submassive PE with RV/LV ratio 1.2-1.5.  She was started on heparin. CT abd and UA concerning for UTI w/ possible pyelonephritis. CXR unremarkable. 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 NSG consulted recommend MRI and decadron, and transfer to [facility].    Patient was in ICU since arrival at [facility].  She had ABLA likely due to retroperitoneal bleed.  Heparin was discontinued.  She had IVC filter placed.  She came off vasopressor on 6/5.  She was transferred to [facility] hospitalist service on 6/8.  She is currently stable on room air.  MRI brain with and without contrast showed large right 6 cm extra-axial mass with approximately 11 mm midline shift likely consistent with meningioma.  Neurosurgery recommends outpatient follow-up in 2 to 3 months for reevaluation and possible surgical planning.  Therapy recommended SNF.  Reportedly, APS involved in the case.  Palliative medicine following.  Subjective: Seen and examined earlier this morning.  No major events overnight of this morning.  She felt a little nauseous after eating lunch.  She denies vomiting or abdominal  pain.  Objective: Vitals:   02/11/22 1939 02/12/22 0424 02/12/22 0919 02/12/22 1521  BP: (!) 151/138  102/66 112/74  Pulse: (!) 57  88 85  Resp:   18 18  Temp: 97.9 F (36.6 C)  98 F (36.7 C) 98.5 F (36.9 C)  TempSrc: Oral  Oral Oral  SpO2: 99%   96%  Weight:  68 kg    Height:        Examination:  GENERAL: No apparent distress.  Nontoxic. HEENT: MMM.  Vision and hearing grossly intact.  NECK: Supple.  No apparent JVD.  RESP:  No IWOB.  Fair aeration bilaterally. CVS:  RRR. Heart sounds normal.  ABD/GI/GU: BS+. Abd soft, NTND.  MSK/EXT:  Moves extremities. No apparent deformity. No edema.  SKIN: no apparent skin lesion or wound NEURO: Awake and alert. Oriented appropriately.  Limited insight.  No apparent focal neuro deficit. PSYCH: Calm. Normal affect.   Procedures:  None  Microbiology summarized: NFAOZ-30 and influenza PCR nonreactive. Blood cultures negative so far Urine culture with insignificant growth.  Assessment and Plan: Brain mass with vasogenic edema and mass effect CT and MRI brain w/o contrast showed 5.2 x 5.3 cm large right basal ganglia mass with surrounding edema and 11 mm left midline shift and near complete effacement of right lateral ventricle.  She was started on Decadron.  Repeat MRI brain with and without contrast showed arge right 6 cm extra-axial mass with approximately 11 mm midline shift likely consistent with meningioma.   -Neurosurgery-outpatient follow-up in 2 to  3 months for reevaluation and possible surgical planning. -Continue Decadron taper-orders in. -Unfortunately, none of the siblings visited yet to help with goal of care and medical decision -Palliative medicine following.  APS notified.   Acute metabolic encephalopathy: She is oriented x4 and aware of her diagnosis but limited insight Cognitive communication deficit-she had cognitive evaluation by SLP -Reorientation and delirium precautions -Minimize sedating  medications -Continue SLP treatment  Physical deconditioning: lives alone. Found down on feces and urine before arrival.  Reportedly estranged from siblings. She has a friend visiting that she identifies as a boyfriend.  She is fairly oriented with fair awareness but limited insight. [place] APS contacted by [facility] to discuss initiating guardianship case for patient. Therapy recommends SNF.  Acute pulmonary embolism with acute cor pulmonale (HCC) Submassive PE with RV to LV ratio of 1.28-1.54.  TTE normal without evidence of right heart strain.  She was in shock on arrival.  She was started on IV heparin, which was later discontinued due to [facility] and retroperitoneal bleed.  She has been off IV heparin since  6/5.  She is currently on room air. -Started prophylactic dose Lovenox on 6/12.  H&H stable.  Hypovolemic shock ([place]): Likely from poor p.o. intake and dehydration.  However, she could be in shock from PE.  She has been off vasopressors since 6/5.  Now hemodynamically stable.  Sepsis ruled out  ABLA likely due to retroperitoneal bleed: Stable. Retroperitoneal hematoma: Due to [facility] for PE.  Heparin discontinued after IVC filter on 6/5.  Recent Labs    02/04/22 0046 02/04/22 0648 02/04/22 1310 02/04/22 1944 02/05/22 0109 02/05/22 0658 02/06/22 0307 02/07/22 0311 02/09/22 0332 02/11/22 0324  HGB 9.5* 9.7* 9.2* 9.1* 8.3* 8.5* 8.7* 9.5* 9.4* 10.5*  -Continue monitoring -Started prophylactic dose Lovenox on 6/12.  Controlled NIDDM-2: A1c 5.5%.  Not on medication at home. Recent Labs  Lab 02/11/22 1218 02/11/22 1658 02/11/22 1941 02/12/22 0707 02/12/22 1106  GLUCAP 170* 135* 156* 140* 103*  -Continue SSI  Distal esophageal thickening: noted on CT.  At [place] junction suspicious for neoplasm or severe esophagitis. Esophageal dysphagia GERD -On dysphagia 2 diet per SLP. -Continue PPI  Acute rhabdomyolysis, now resolved  Hyponatremia: Stable.  Continue monitoring  Lactic acidosis:  Resolved.  Elevated troponin: Likely demand ischemia in the setting of right heart strain from PE.    AKI: Likely prerenal.  Resolved.  Bandemia:Likely demargination from steroid.  Continue monitoring  Acute respiratory failure with hypoxia:Resolved.  On room air.   Pressure skin injury: POA Pressure Injury 01/30/22 Sacrum Right;Medial Stage 2 -  Partial thickness loss of dermis presenting as a shallow open injury with a red, pink wound bed without slough. (Active)  01/30/22 2053  Location: Sacrum  Location Orientation: Right;Medial  Staging: Stage 2 -  Partial thickness loss of dermis presenting as a shallow open injury with a red, pink wound bed without slough.  Wound Description (Comments):   Present on Admission: Yes  Dressing Type Foam - Lift dressing to assess site every shift 02/12/22 1000     Pressure Injury 01/30/22 Coccyx Lower;Medial Stage 2 -  Partial thickness loss of dermis presenting as a shallow open injury with a red, pink wound bed without slough. (Active)  01/30/22 2053  Location: Coccyx  Location Orientation: Lower;Medial  Staging: Stage 2 -  Partial thickness loss of dermis presenting as a shallow open injury with a red, pink wound bed without slough.  Wound Description (Comments):   Present on Admission: Yes  Dressing Type Foam - Lift dressing to assess site every shift 02/12/22 1000   DVT prophylaxis:  enoxaparin (LOVENOX) injection 40 mg Start: 02/09/22 1800 Place and maintain sequential compression device Start: 02/02/22 1450  Code Status: Full code Family Communication: None at bedside today Level of care: Med-Surg Status is: Inpatient Remains inpatient appropriate because: Safe disposition/SNF.   Final disposition: SNF Consultants:  Pulmonology admitted patient Neurosurgery-signed off Palliative medicine  Sch Meds:  Scheduled Meds:  dexamethasone  4 mg Oral Q12H   Followed by   Derrill Memo ON 02/14/2022] dexamethasone  4 mg Oral Daily   docusate  sodium  100 mg Oral BID   enoxaparin (LOVENOX) injection  40 mg Subcutaneous Q24H   insulin aspart  0-9 Units Subcutaneous TID AC & HS   lisinopril  20 mg Oral Daily   pantoprazole  40 mg Oral BID   polyethylene glycol  17 g Oral BID   senna  1 tablet Oral QHS   Continuous Infusions:  sodium chloride Stopped (02/02/22 0929)   PRN Meds:.acetaminophen, calcium carbonate, haloperidol lactate, iohexol, ondansetron (ZOFRAN) IV  Antimicrobials: Anti-infectives (From admission, onward)    Start     Dose/Rate Route Frequency Ordered Stop   02/03/22 1715  levofloxacin (LEVAQUIN) tablet 500 mg  Status:  Discontinued        500 mg Oral Daily 02/03/22 1627 02/05/22 1355   02/02/22 2045  levofloxacin (LEVAQUIN) IVPB 500 mg  Status:  Discontinued       Note to Pharmacy: Adjust as needed - switching to IV since unable to take PO   500 mg 100 mL/hr over 60 Minutes Intravenous Every 24 hours 02/02/22 1959 02/03/22 1627   02/01/22 1400  levofloxacin (LEVAQUIN) tablet 500 mg  Status:  Discontinued        500 mg Oral Daily 02/01/22 0911 02/02/22 1959   01/31/22 1000  vancomycin (VANCOREADY) IVPB 750 mg/150 mL  Status:  Discontinued        750 mg 150 mL/hr over 60 Minutes Intravenous Every 12 hours 01/31/22 0003 01/31/22 0848   01/31/22 0200  vancomycin (VANCOREADY) IVPB 1500 mg/300 mL  Status:  Discontinued        1,500 mg 150 mL/hr over 120 Minutes Intravenous Every 12 hours 01/30/22 1241 01/30/22 2348   01/30/22 2100  aztreonam (AZACTAM) 2 g in sodium chloride 0.9 % 100 mL IVPB  Status:  Discontinued        2 g 200 mL/hr over 30 Minutes Intravenous Every 8 hours 01/30/22 1240 02/01/22 0923   01/30/22 1245  aztreonam (AZACTAM) 2 g in sodium chloride 0.9 % 100 mL IVPB        2 g 200 mL/hr over 30 Minutes Intravenous  Once 01/30/22 1231 01/30/22 1408   01/30/22 1245  metroNIDAZOLE (FLAGYL) IVPB 500 mg        500 mg 100 mL/hr over 60 Minutes Intravenous  Once 01/30/22 1231 01/30/22 1408   01/30/22  1245  vancomycin (VANCOCIN) IVPB 1000 mg/200 mL premix  Status:  Discontinued        1,000 mg 200 mL/hr over 60 Minutes Intravenous  Once 01/30/22 1231 01/30/22 1239   01/30/22 1245  vancomycin (VANCOREADY) IVPB 2000 mg/400 mL        2,000 mg 200 mL/hr over 120 Minutes Intravenous  Once 01/30/22 1239 01/30/22 1607        I have personally reviewed the following labs and images: CBC: Recent Labs  Lab 02/06/22 0307 02/07/22 0311 02/09/22 0332  02/11/22 0324  WBC 17.3* 17.1* 24.9* 20.6*  HGB 8.7* 9.5* 9.4* 10.5*  HCT 25.7* 27.4* 29.5* 32.5*  MCV 80.3 79.2* 82.4 81.5  PLT 166 197 282 359   BMP &GFR Recent Labs  Lab 02/07/22 0311 02/09/22 0332 02/11/22 0324  NA 130* 131* 131*  K 3.6 4.0 4.2  CL 99 99 99  CO2 '26 24 24  '$ GLUCOSE 128* 155* 159*  BUN '9 16 14  '$ CREATININE 0.49 0.59 0.59  CALCIUM 7.9* 8.1* 8.7*  MG 1.9 2.1 2.1  PHOS 2.7 2.6 3.5   Estimated Creatinine Clearance: 63.1 mL/min (by C-G formula based on SCr of 0.59 mg/dL). Liver & Pancreas: Recent Labs  Lab 02/07/22 0311 02/09/22 0332 02/11/22 0324  ALBUMIN 2.5* 2.4* 2.8*   No results for input(s): "LIPASE", "AMYLASE" in the last 168 hours.  No results for input(s): "AMMONIA" in the last 168 hours. Diabetic: No results for input(s): "HGBA1C" in the last 72 hours. Recent Labs  Lab 02/11/22 1218 02/11/22 1658 02/11/22 1941 02/12/22 0707 02/12/22 1106  GLUCAP 170* 135* 156* 140* 103*   Cardiac Enzymes: No results for input(s): "CKTOTAL", "CKMB", "CKMBINDEX", "TROPONINI" in the last 168 hours.  No results for input(s): "PROBNP" in the last 8760 hours. Coagulation Profile: No results for input(s): "INR", "PROTIME" in the last 168 hours.  Thyroid Function Tests: No results for input(s): "TSH", "T4TOTAL", "FREET4", "T3FREE", "THYROIDAB" in the last 72 hours. Lipid Profile: No results for input(s): "CHOL", "HDL", "LDLCALC", "TRIG", "CHOLHDL", "LDLDIRECT" in the last 72 hours. Anemia Panel: No results  for input(s): "VITAMINB12", "FOLATE", "FERRITIN", "TIBC", "IRON", "RETICCTPCT" in the last 72 hours.  Urine analysis:    Component Value Date/Time   COLORURINE YELLOW 01/30/2022 1720   APPEARANCEUR CLEAR 01/30/2022 1720   LABSPEC 1.044 (H) 01/30/2022 1720   PHURINE 5.0 01/30/2022 1720   GLUCOSEU NEGATIVE 01/30/2022 1720   HGBUR SMALL (A) 01/30/2022 1720   BILIRUBINUR NEGATIVE 01/30/2022 1720   KETONESUR 20 (A) 01/30/2022 1720   PROTEINUR 30 (A) 01/30/2022 1720   NITRITE POSITIVE (A) 01/30/2022 1720   LEUKOCYTESUR SMALL (A) 01/30/2022 1720   Sepsis Labs: Invalid input(s): "PROCALCITONIN", "LACTICIDVEN"  Microbiology: No results found for this or any previous visit (from the past 240 hour(s)).   Radiology Studies: No results found.    Opal Mckellips T. Patterson  If 7PM-7AM, please contact night-coverage [email or web address] 02/12/2022, 3:51 PM

## 2022-02-13 DIAGNOSIS — G9341 Metabolic encephalopathy: Secondary | ICD-10-CM | POA: Diagnosis not present

## 2022-02-13 DIAGNOSIS — D62 Acute posthemorrhagic anemia: Secondary | ICD-10-CM | POA: Diagnosis not present

## 2022-02-13 DIAGNOSIS — R933 Abnormal findings on diagnostic imaging of other parts of digestive tract: Secondary | ICD-10-CM | POA: Diagnosis not present

## 2022-02-13 DIAGNOSIS — I2609 Other pulmonary embolism with acute cor pulmonale: Secondary | ICD-10-CM | POA: Diagnosis not present

## 2022-02-13 LAB — CBC
HCT: 31.6 % — ABNORMAL LOW (ref 36.0–46.0)
Hemoglobin: 10.5 g/dL — ABNORMAL LOW (ref 12.0–15.0)
MCH: 27.8 pg (ref 26.0–34.0)
MCHC: 33.2 g/dL (ref 30.0–36.0)
MCV: 83.6 fL (ref 80.0–100.0)
Platelets: 287 10*3/uL (ref 150–400)
RBC: 3.78 MIL/uL — ABNORMAL LOW (ref 3.87–5.11)
RDW: 24.3 % — ABNORMAL HIGH (ref 11.5–15.5)
WBC: 15.7 10*3/uL — ABNORMAL HIGH (ref 4.0–10.5)
nRBC: 0 % (ref 0.0–0.2)

## 2022-02-13 LAB — RENAL FUNCTION PANEL
Albumin: 2.8 g/dL — ABNORMAL LOW (ref 3.5–5.0)
Anion gap: 10 (ref 5–15)
BUN: 18 mg/dL (ref 8–23)
CO2: 24 mmol/L (ref 22–32)
Calcium: 8.9 mg/dL (ref 8.9–10.3)
Chloride: 98 mmol/L (ref 98–111)
Creatinine, Ser: 0.69 mg/dL (ref 0.44–1.00)
GFR, Estimated: >60 mL/min (ref >60)
Glucose, Bld: 167 mg/dL — ABNORMAL HIGH (ref 70–99)
Phosphorus: 3.5 mg/dL (ref 2.5–4.6)
Potassium: 4.1 mmol/L (ref 3.5–5.1)
Sodium: 132 mmol/L — ABNORMAL LOW (ref 135–145)

## 2022-02-13 LAB — GLUCOSE, CAPILLARY
Glucose-Capillary: 141 mg/dL — ABNORMAL HIGH (ref 70–99)
Glucose-Capillary: 127 mg/dL — ABNORMAL HIGH (ref 70–99)
Glucose-Capillary: 145 mg/dL — ABNORMAL HIGH (ref 70–99)

## 2022-02-13 LAB — MAGNESIUM: Magnesium: 1.9 mg/dL (ref 1.7–2.4)

## 2022-02-13 NOTE — Plan of Care (Signed)

## 2022-02-13 NOTE — TOC Progression Note (Signed)
Transition of Care [facility]) - Progression Note    Patient Details  Name: Jennifer Pacheco MRN: [id number] Date of Birth: July 07, 1956  Transition of Care [facility]) CM/SW Contact  Curlene Labrum, RN Phone Number: 02/13/2022, 2:38 PM  Clinical Narrative:    CM spoke with Carly, PT and patient is requesting Bra to wear during therapy - I called and left a message with the patient's APS SW, Doreen Beam and friend, Sherri Rad to request bra and articles of clothing from home.  Patient has pending guardianship needs through [place].   Expected Discharge Plan: (P) Skilled Nursing Facility Barriers to Discharge: (P) Continued Medical Work up, Unsafe home situation, Family Issues  Expected Discharge Plan and Services Expected Discharge Plan: (P) [place] In-house Referral: Clinical Social Work, Hospice / [place] Acute Care Choice: [place] Living arrangements for the past 2 months: Single Family Home                                       Social Determinants of Health (SDOH) Interventions    Readmission Risk Interventions    02/11/2022    2:16 PM  Readmission Risk Prevention Plan  Transportation Screening Complete  PCP or Specialist Appt within 5-7 Days Complete  Home Care Screening Complete  Medication Review (RN CM) Complete

## 2022-02-13 NOTE — Progress Notes (Signed)
PROGRESS NOTE  Jennifer Pacheco PFX:[id number] DOB: 04-Apr-1956   PCP: [name], NP  Patient is from: Home.  Since she lives alone.  DOA: 01/30/2022 LOS: 52  Chief complaints Chief Complaint  Patient presents with   Hypotension     Brief Narrative / Interim history: 66 year old F with PMH of DM-2 and HTN presented to [facility] ED on 6/2 with AMS after she was found down altered in feces and urine by someone who went to check on her.  She reports difficulty getting up after fall on her left buttock.  Unclear how long she was down.  She was found to be hypoxic and hypotensive.  She was started on IV fluids, vasopressors, supplemental oxygen and broad-spectrum antibiotics.  CT chest showed submassive PE with RV/LV ratio 1.2-1.5.  She was started on heparin. CT abd and UA concerning for UTI w/ possible pyelonephritis. CXR unremarkable. 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 NSG consulted recommend MRI and decadron, and transfer to [facility].    Patient was in ICU since arrival at [facility].  She had ABLA likely due to retroperitoneal bleed.  Heparin was discontinued.  She had IVC filter placed.  She came off vasopressor on 6/5.  She was transferred to [facility] hospitalist service on 6/8.  She is currently stable on room air.  MRI brain with and without contrast showed large right 6 cm extra-axial mass with approximately 11 mm midline shift likely consistent with meningioma.  Neurosurgery recommends outpatient follow-up in 2 to 3 months for reevaluation and possible surgical planning.  Therapy recommended SNF.  Reportedly, APS involved in the case.  Palliative medicine following.  Medically optimized for discharge.  Subjective: Seen and examined earlier this morning.  No major events overnight of this morning.  No complaints but sleepy this  morning.  Objective: Vitals:   02/12/22 0919 02/12/22 1521 02/12/22 1932 02/13/22 0748  BP: 102/66 112/74 103/70 110/78  Pulse: 88 85  88  Resp: '18 18  17  '$ Temp: 98 F (36.7 C) 98.5 F (36.9 C) 98.2 F (36.8 C) 97.8 F (36.6 C)  TempSrc: Oral Oral Oral Oral  SpO2:  96%  97%  Weight:      Height:        Examination:  GENERAL: No apparent distress.  Nontoxic. HEENT: MMM.  Vision and hearing grossly intact.  NECK: Supple.  No apparent JVD.  RESP:  No IWOB.  Fair aeration bilaterally. CVS:  RRR. Heart sounds normal.  ABD/GI/GU: BS+. Abd soft, NTND.  MSK/EXT:  Moves extremities. No apparent deformity. No edema.  SKIN: no apparent skin lesion or wound NEURO: Sleepy but wakes to voice.  Fairly oriented.  Limited insight.  No apparent focal neuro deficit. PSYCH: Calm. Normal affect.   Procedures:  None  Microbiology summarized: [id number] and influenza PCR nonreactive. Blood cultures negative so far Urine culture with insignificant growth.  Assessment and Plan: Brain mass with vasogenic edema and mass effect CT and MRI brain w/o contrast showed 5.2 x 5.3 cm large right basal ganglia mass with surrounding edema and 11 mm left midline shift and near complete effacement of right lateral ventricle.  She was started on Decadron.  Repeat MRI brain with and without contrast showed arge right 6 cm extra-axial mass with approximately 11 mm midline shift likely consistent with meningioma.   -Neurosurgery-outpatient follow-up in 2 to 3  months for reevaluation and possible surgical planning. -Continue Decadron taper-orders in. -Unfortunately, none of the siblings visited yet to help with goal of care and medical decision -Palliative medicine following.  APS notified.   Acute metabolic encephalopathy: She is oriented x4 and aware of her diagnosis but limited insight Cognitive communication deficit-she had cognitive evaluation by SLP -Reorientation and delirium precautions -Minimize  sedating medications -Continue SLP treatment  Physical deconditioning: lives alone. Found down on feces and urine before arrival.  Reportedly estranged from siblings. She has a friend visiting that she identifies as a boyfriend.  She is fairly oriented with fair awareness but limited insight. [place] APS contacted by [facility] to discuss initiating guardianship case for patient. Therapy recommends SNF.  Medically optimized.  Acute pulmonary embolism with acute cor pulmonale (HCC) Submassive PE with RV to LV ratio of 1.28-1.54.  TTE normal without evidence of right heart strain.  She was in shock on arrival.  She was started on IV heparin, which was later discontinued due to [facility] and retroperitoneal bleed.  She has been off IV heparin since  6/5.  She is currently on room air. -Started prophylactic dose Lovenox on 6/12.  H&H stable.  Hypovolemic shock ([place]): Likely from poor p.o. intake and dehydration.  However, she could be in shock from PE.  She has been off vasopressors since 6/5.  Now hemodynamically stable.  Sepsis ruled out  ABLA likely due to retroperitoneal bleed: Stable. Retroperitoneal hematoma: Due to [facility] for PE.  Heparin discontinued after IVC filter on 6/5.  Recent Labs    02/04/22 0648 02/04/22 1310 02/04/22 1944 02/05/22 0109 02/05/22 7939 02/06/22 0307 02/07/22 0311 02/09/22 0332 02/11/22 0324 02/13/22 0527  HGB 9.7* 9.2* 9.1* 8.3* 8.5* 8.7* 9.5* 9.4* 10.5* 10.5*  -Continue monitoring -Started prophylactic dose Lovenox on 6/12.  Controlled NIDDM-2: A1c 5.5%.  Not on medication at home. Recent Labs  Lab 02/12/22 1106 02/12/22 1713 02/12/22 1932 02/13/22 0751 02/13/22 1206  GLUCAP 103* 173* 165* 141* 127*  -Continue SSI-sensitive.  Distal esophageal thickening: noted on CT.  At [place] junction suspicious for neoplasm or severe esophagitis. Esophageal dysphagia GERD -On dysphagia 2 diet per SLP. -Continue PPI  Acute rhabdomyolysis, now resolved  Hyponatremia: Stable.   Continue monitoring  Lactic acidosis: Resolved.  Elevated troponin: Likely demand ischemia in the setting of right heart strain from PE.    AKI: Likely prerenal.  Resolved.  Bandemia:Likely demargination from steroid.  Continue monitoring  Acute respiratory failure with hypoxia:Resolved.  On room air.   Pressure skin injury: POA Pressure Injury 01/30/22 Sacrum Right;Medial Stage 2 -  Partial thickness loss of dermis presenting as a shallow open injury with a red, pink wound bed without slough. (Active)  01/30/22 2053  Location: Sacrum  Location Orientation: Right;Medial  Staging: Stage 2 -  Partial thickness loss of dermis presenting as a shallow open injury with a red, pink wound bed without slough.  Wound Description (Comments):   Present on Admission: Yes  Dressing Type Foam - Lift dressing to assess site every shift 02/13/22 0800     Pressure Injury 01/30/22 Coccyx Lower;Medial Stage 2 -  Partial thickness loss of dermis presenting as a shallow open injury with a red, pink wound bed without slough. (Active)  01/30/22 2053  Location: Coccyx  Location Orientation: Lower;Medial  Staging: Stage 2 -  Partial thickness loss of dermis presenting as a shallow open injury with a red, pink wound bed without slough.  Wound Description (Comments):   Present on  Admission: Yes  Dressing Type Foam - Lift dressing to assess site every shift 02/13/22 0800   DVT prophylaxis:  enoxaparin (LOVENOX) injection 40 mg Start: 02/09/22 1800 Place and maintain sequential compression device Start: 02/02/22 1450  Code Status: Full code Family Communication: None at bedside today Level of care: Med-Surg Status is: Inpatient Remains inpatient appropriate because: Safe disposition/SNF.   Final disposition: SNF Consultants:  Pulmonology admitted patient Neurosurgery-signed off Palliative medicine  Sch Meds:  Scheduled Meds:  [START ON 02/14/2022] dexamethasone  4 mg Oral Daily   docusate sodium   100 mg Oral BID   enoxaparin (LOVENOX) injection  40 mg Subcutaneous Q24H   insulin aspart  0-9 Units Subcutaneous TID AC & HS   lisinopril  20 mg Oral Daily   pantoprazole  40 mg Oral BID   polyethylene glycol  17 g Oral BID   senna  1 tablet Oral QHS   Continuous Infusions:  sodium chloride Stopped (02/02/22 0929)   PRN Meds:.acetaminophen, calcium carbonate, haloperidol lactate, iohexol, ondansetron (ZOFRAN) IV  Antimicrobials: Anti-infectives (From admission, onward)    Start     Dose/Rate Route Frequency Ordered Stop   02/03/22 1715  levofloxacin (LEVAQUIN) tablet 500 mg  Status:  Discontinued        500 mg Oral Daily 02/03/22 1627 02/05/22 1355   02/02/22 2045  levofloxacin (LEVAQUIN) IVPB 500 mg  Status:  Discontinued       Note to Pharmacy: Adjust as needed - switching to IV since unable to take PO   500 mg 100 mL/hr over 60 Minutes Intravenous Every 24 hours 02/02/22 1959 02/03/22 1627   02/01/22 1400  levofloxacin (LEVAQUIN) tablet 500 mg  Status:  Discontinued        500 mg Oral Daily 02/01/22 0911 02/02/22 1959   01/31/22 1000  vancomycin (VANCOREADY) IVPB 750 mg/150 mL  Status:  Discontinued        750 mg 150 mL/hr over 60 Minutes Intravenous Every 12 hours 01/31/22 0003 01/31/22 0848   01/31/22 0200  vancomycin (VANCOREADY) IVPB 1500 mg/300 mL  Status:  Discontinued        1,500 mg 150 mL/hr over 120 Minutes Intravenous Every 12 hours 01/30/22 1241 01/30/22 2348   01/30/22 2100  aztreonam (AZACTAM) 2 g in sodium chloride 0.9 % 100 mL IVPB  Status:  Discontinued        2 g 200 mL/hr over 30 Minutes Intravenous Every 8 hours 01/30/22 1240 02/01/22 0923   01/30/22 1245  aztreonam (AZACTAM) 2 g in sodium chloride 0.9 % 100 mL IVPB        2 g 200 mL/hr over 30 Minutes Intravenous  Once 01/30/22 1231 01/30/22 1408   01/30/22 1245  metroNIDAZOLE (FLAGYL) IVPB 500 mg        500 mg 100 mL/hr over 60 Minutes Intravenous  Once 01/30/22 1231 01/30/22 1408   01/30/22 1245   vancomycin (VANCOCIN) IVPB 1000 mg/200 mL premix  Status:  Discontinued        1,000 mg 200 mL/hr over 60 Minutes Intravenous  Once 01/30/22 1231 01/30/22 1239   01/30/22 1245  vancomycin (VANCOREADY) IVPB 2000 mg/400 mL        2,000 mg 200 mL/hr over 120 Minutes Intravenous  Once 01/30/22 1239 01/30/22 1607        I have personally reviewed the following labs and images: CBC: Recent Labs  Lab 02/07/22 0311 02/09/22 0332 02/11/22 0324 02/13/22 0527  WBC 17.1* 24.9* 20.6* 15.7*  HGB 9.5* 9.4* 10.5* 10.5*  HCT 27.4* 29.5* 32.5* 31.6*  MCV 79.2* 82.4 81.5 83.6  PLT 197 282 359 287   BMP &GFR Recent Labs  Lab 02/07/22 0311 02/09/22 0332 02/11/22 0324 02/13/22 0527  NA 130* 131* 131* 132*  K 3.6 4.0 4.2 4.1  CL 99 99 99 98  CO2 '26 24 24 24  '$ GLUCOSE 128* 155* 159* 167*  BUN '9 16 14 18  '$ CREATININE 0.49 0.59 0.59 0.69  CALCIUM 7.9* 8.1* 8.7* 8.9  MG 1.9 2.1 2.1 1.9  PHOS 2.7 2.6 3.5 3.5   Estimated Creatinine Clearance: 63.1 mL/min (by C-G formula based on SCr of 0.69 mg/dL). Liver & Pancreas: Recent Labs  Lab 02/07/22 0311 02/09/22 0332 02/11/22 0324 02/13/22 0527  ALBUMIN 2.5* 2.4* 2.8* 2.8*   No results for input(s): "LIPASE", "AMYLASE" in the last 168 hours.  No results for input(s): "AMMONIA" in the last 168 hours. Diabetic: No results for input(s): "HGBA1C" in the last 72 hours. Recent Labs  Lab 02/12/22 1106 02/12/22 1713 02/12/22 1932 02/13/22 0751 02/13/22 1206  GLUCAP 103* 173* 165* 141* 127*   Cardiac Enzymes: No results for input(s): "CKTOTAL", "CKMB", "CKMBINDEX", "TROPONINI" in the last 168 hours.  No results for input(s): "PROBNP" in the last 8760 hours. Coagulation Profile: No results for input(s): "INR", "PROTIME" in the last 168 hours.  Thyroid Function Tests: No results for input(s): "TSH", "T4TOTAL", "FREET4", "T3FREE", "THYROIDAB" in the last 72 hours. Lipid Profile: No results for input(s): "CHOL", "HDL", "LDLCALC", "TRIG",  "CHOLHDL", "LDLDIRECT" in the last 72 hours. Anemia Panel: No results for input(s): "VITAMINB12", "FOLATE", "FERRITIN", "TIBC", "IRON", "RETICCTPCT" in the last 72 hours.  Urine analysis:    Component Value Date/Time   COLORURINE YELLOW 01/30/2022 1720   APPEARANCEUR CLEAR 01/30/2022 1720   LABSPEC 1.044 (H) 01/30/2022 1720   PHURINE 5.0 01/30/2022 1720   GLUCOSEU NEGATIVE 01/30/2022 1720   HGBUR SMALL (A) 01/30/2022 1720   BILIRUBINUR NEGATIVE 01/30/2022 1720   KETONESUR 20 (A) 01/30/2022 1720   PROTEINUR 30 (A) 01/30/2022 1720   NITRITE POSITIVE (A) 01/30/2022 1720   LEUKOCYTESUR SMALL (A) 01/30/2022 1720   Sepsis Labs: Invalid input(s): "PROCALCITONIN", "LACTICIDVEN"  Microbiology: No results found for this or any previous visit (from the past 240 hour(s)).   Radiology Studies: No results found.    Camryn Lampson T. [place]  If 7PM-7AM, please contact night-coverage [email or web address] 02/13/2022, 3:06 PM

## 2022-02-13 NOTE — Progress Notes (Signed)
Physical Therapy Treatment Patient Details Name: Jennifer Pacheco MRN: [id number] DOB: 1955-09-21 Today's Date: 02/13/2022   History of Present Illness Pt is 66 yo female who was found down in her home and taken to [facility]. 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 Pt with retroperitoneal bleed and IVC filter placed 6/5. PMH: DM2, HTN.    PT Comments    Pt received in recliner, agreeable to therapy session with fair participation, limited due to need for toileting and cognitive deficit. Pt able to perform transfers and short gait task at bedside, distance limited due to bowel urgency and pt sitting up on [facility] with NT present for safety at end of session. Attempted to check on pt afterward to see if she would perform more standing/gait progression however pt back in bed and sleeping, per NT pt impulsive to get up without waiting for staff to call PTA and nursing assisted her back to bed, alarm on for safety. Pt continues to benefit from PT services to progress toward functional mobility goals.   Recommendations for follow up therapy are one component of a multi-disciplinary discharge planning process, led by the attending physician.  Recommendations may be updated based on patient status, additional functional criteria and insurance authorization.  Follow Up Recommendations  Skilled nursing-short term rehab (<3 hours/day)     Assistance Recommended at Discharge Frequent or constant Supervision/Assistance  Patient can return home with the following A lot of help with walking and/or transfers;A little help with bathing/dressing/bathroom;Assistance with cooking/housework;Direct supervision/assist for medications management;Direct supervision/assist for financial management;Assist for transportation;Help with stairs or ramp for entrance   Equipment Recommendations  Other (comment)    Recommendations for Other Services       Precautions / Restrictions  Precautions Precautions: Fall Precaution Comments: HOH; L side inattention Restrictions Weight Bearing Restrictions: No     Mobility  Bed Mobility               General bed mobility comments: received in recliner    Transfers Overall transfer level: Needs assistance Equipment used: Rolling walker (2 wheels) Transfers: Sit to/from Stand, Bed to chair/wheelchair/BSC Sit to Stand: Min assist, +2 safety/equipment           General transfer comment: from chair, mod cues for safe UE placement; pt can be impulsive to sit, hand over hand assist to safely locate arm rests    Ambulation/Gait Ambulation/Gait assistance: Min assist, +2 safety/equipment Gait Distance (Feet): 8 Feet Assistive device: Rolling walker (2 wheels) Gait Pattern/deviations: Shuffle, Trunk flexed, Step-to pattern       General Gait Details: small low steps using RW too far advanced, assist to manage RW especially with turns     Balance Overall balance assessment: Needs assistance Sitting-balance support: Bilateral upper extremity supported, Feet supported Sitting balance-Leahy Scale: Fair Sitting balance - Comments: supervision for sitting balance   Standing balance support: Bilateral upper extremity supported, Reliant on assistive device for balance Standing balance-Leahy Scale: Poor Standing balance comment: reliant on external support                            Cognition Arousal/Alertness: Awake/alert Behavior During Therapy: Impulsive Overall Cognitive Status: Impaired/Different from baseline Area of Impairment: Attention, Memory, Following commands, Safety/judgement, Awareness, Problem solving, Orientation                 Orientation Level: Disoriented to, Time, Situation Current Attention Level: Focused  Memory: Decreased recall of precautions, Decreased short-term memory Following Commands: Follows one step commands inconsistently Safety/Judgement: Decreased awareness  of safety, Decreased awareness of deficits Awareness: Intellectual Problem Solving: Difficulty sequencing, Requires verbal cues, Requires tactile cues General Comments: required verbal cues to stay on tasks, requires increased time, "I don't like to be rushed"; NT remaining in room for safety while pt on BSC, pt slow to initiate movements while therapist in room but when she wants to, can be very quick with poor insight into safety/deficits.           General Comments General comments (skin integrity, edema, etc.): VSS per chart review, no overt s/sx distress and not dyspneic. Ambulation limited due to pt bowel urgency and then pt had already returned to bed with nursing staff assist when re-checked and had fallen asleep.      Pertinent Vitals/Pain Pain Assessment Pain Assessment: No/denies pain Pain Intervention(s): Monitored during session, Repositioned           PT Goals (current goals can now be found in the care plan section) Acute Rehab PT Goals Patient Stated Goal: to rest; pt unable to state therapy-related goal due to cognition PT Goal Formulation: Patient unable to participate in goal setting Time For Goal Achievement: 02/15/22 Progress towards PT goals: Progressing toward goals (slow progress, cognition limiting)    Frequency    Min 3X/week      PT Plan Current plan remains appropriate       AM-PAC PT "6 Clicks" Mobility   Outcome Measure  Help needed turning from your back to your side while in a flat bed without using bedrails?: A Lot Help needed moving from lying on your back to sitting on the side of a flat bed without using bedrails?: A Little Help needed moving to and from a bed to a chair (including a wheelchair)?: A Lot (mod cues) Help needed standing up from a chair using your arms (e.g., wheelchair or bedside chair)?: A Little Help needed to walk in hospital room?: Total Help needed climbing 3-5 steps with a railing? : Total 6 Click Score: 12     End of Session Equipment Utilized During Treatment: Gait belt Activity Tolerance: Patient limited by fatigue;Other (comment) (pt needed to sit on [facility] for BM with NT standing by, then fell asleep after impusively getting back to bed with NT assist after toileting, deferring further mobility) Patient left: in chair;with call bell/phone within reach;with nursing/sitter in room;Other (comment) (NT in room with her for safety while pt on [facility]) Nurse Communication: Mobility status;Other (comment) (pt perseverating on asking for a bra, may participate better in therapies once this issue is resolved?) PT Visit Diagnosis: Unsteadiness on feet (R26.81);Difficulty in walking, not elsewhere classified (R26.2)     Time: 1040-1049 PT Time Calculation (min) (ACUTE ONLY): 9 min  Charges:  $Therapeutic Activity: 8-22 mins                     Sammuel Blick P., PTA Acute Rehabilitation Services Secure Chat Preferred 9a-5:30pm Office: [place] 02/13/2022, 12:29 PM

## 2022-02-14 DIAGNOSIS — D62 Acute posthemorrhagic anemia: Secondary | ICD-10-CM | POA: Diagnosis not present

## 2022-02-14 DIAGNOSIS — I2609 Other pulmonary embolism with acute cor pulmonale: Secondary | ICD-10-CM | POA: Diagnosis not present

## 2022-02-14 DIAGNOSIS — G9341 Metabolic encephalopathy: Secondary | ICD-10-CM | POA: Diagnosis not present

## 2022-02-14 DIAGNOSIS — R933 Abnormal findings on diagnostic imaging of other parts of digestive tract: Secondary | ICD-10-CM | POA: Diagnosis not present

## 2022-02-14 LAB — GLUCOSE, CAPILLARY
Glucose-Capillary: 121 mg/dL — ABNORMAL HIGH (ref 70–99)
Glucose-Capillary: 165 mg/dL — ABNORMAL HIGH (ref 70–99)
Glucose-Capillary: 173 mg/dL — ABNORMAL HIGH (ref 70–99)
Glucose-Capillary: 171 mg/dL — ABNORMAL HIGH (ref 70–99)
Glucose-Capillary: 151 mg/dL — ABNORMAL HIGH (ref 70–99)

## 2022-02-14 NOTE — Progress Notes (Signed)
PROGRESS NOTE  Jennifer Pacheco WRU:[id number] DOB: Jul 30, 1956   PCP: [name], NP  Patient is from: Home.  Since she lives alone.  DOA: 01/30/2022 LOS: 62  Chief complaints Chief Complaint  Patient presents with   Hypotension     Brief Narrative / Interim history: 66 year old F with PMH of DM-2 and HTN presented to [facility] ED on 6/2 with AMS after she was found down altered in feces and urine by someone who went to check on her.  She reports difficulty getting up after fall on her left buttock.  Unclear how long she was down.  She was found to be hypoxic and hypotensive.  She was started on IV fluids, vasopressors, supplemental oxygen and broad-spectrum antibiotics.  CT chest showed submassive PE with RV/LV ratio 1.2-1.5.  She was started on heparin. CT abd and UA concerning for UTI w/ possible pyelonephritis. CXR unremarkable. 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 NSG consulted recommend MRI and decadron, and transfer to [facility].    Patient was in ICU since arrival at [facility].  She had ABLA likely due to retroperitoneal bleed.  Heparin was discontinued.  She had IVC filter placed.  She came off vasopressor on 6/5.  She was transferred to [facility] hospitalist service on 6/8.  She is currently stable on room air.  MRI brain with and without contrast showed large right 6 cm extra-axial mass with approximately 11 mm midline shift likely consistent with meningioma.  Neurosurgery recommends outpatient follow-up in 2 to 3 months for reevaluation and possible surgical planning.  Therapy recommended SNF.  Reportedly, APS involved in the case.  Palliative medicine following.  Medically optimized for discharge.  Subjective: Seen and examined earlier this morning.  No major events overnight of this morning.  She is unhappy that she was not allowed to take  shower.  No other complaints.  Objective: Vitals:   02/13/22 0748 02/13/22 1933 02/14/22 0900 02/14/22 0920  BP: 110/78 111/77 (!) 79/52 90/60  Pulse: 88 71 74   Resp: '17 17 17   '$ Temp: 97.8 F (36.6 C) 97.7 F (36.5 C) 98.2 F (36.8 C)   TempSrc: Oral Oral Oral   SpO2: 97%  99%   Weight:      Height:        Examination:  GENERAL: No apparent distress.  Nontoxic. HEENT: MMM.  Vision and hearing grossly intact.  NECK: Supple.  No apparent JVD.  RESP:  No IWOB.  Fair aeration bilaterally. CVS:  RRR. Heart sounds normal.  ABD/GI/GU: BS+. Abd soft, NTND.  MSK/EXT:  Moves extremities. No apparent deformity. No edema.  SKIN: no apparent skin lesion or wound NEURO: Awake and alert. Oriented fairly but limited insight.  No apparent focal neuro deficit. PSYCH: Calm. Normal affect.   Procedures:  None  Microbiology summarized: [id number] and influenza PCR nonreactive. Blood cultures negative so far Urine culture with insignificant growth.  Assessment and Plan: Brain mass with vasogenic edema and mass effect CT and MRI brain w/o contrast showed 5.2 x 5.3 cm large right basal ganglia mass with surrounding edema and 11 mm left midline shift and near complete effacement of right lateral ventricle.  She was started on Decadron.  Repeat MRI brain with and without contrast showed arge right 6 cm extra-axial mass with approximately 11 mm midline shift likely consistent with meningioma.   -Neurosurgery-outpatient follow-up  in 2 to 3 months for reevaluation and possible surgical planning. -Continue Decadron taper-now on 4 mg daily.  Probably need to stay on this -Unfortunately, none of the siblings visited yet to help with goal of care and medical decision -Palliative medicine following.  APS involved.   Acute metabolic encephalopathy: She is oriented x4 and aware of her diagnosis but limited insight Cognitive communication deficit-she had cognitive evaluation by SLP -Reorientation and  delirium precautions -Minimize sedating medications -Continue SLP treatment  Physical deconditioning: lives alone. Found down on feces and urine before arrival.  Reportedly estranged from siblings. She has a friend visiting that she identifies as a boyfriend.  She is fairly oriented with fair awareness but limited insight. [place] [facility]PS contacted by [facility] to discuss initiating guardianship case for patient. Therapy recommends SNF.  Medically optimized.  Acute pulmonary embolism with acute cor pulmonale (HCC) Submassive PE with RV to LV ratio of 1.28-1.54.  TTE normal without evidence of right heart strain.  She was in shock on arrival.  She was started on IV heparin, which was later discontinued due to [facility] and retroperitoneal bleed.  She has been off IV heparin since  6/5.  She is currently on room air. -Started prophylactic dose Lovenox on 6/12.  H&H stable.  Hypovolemic shock ([place]): Likely from poor p.o. intake and dehydration.  However, she could be in shock from PE.  She has been off vasopressors since 6/5.  Now hemodynamically stable.  Sepsis ruled out  ABLA likely due to retroperitoneal bleed: Stable. Retroperitoneal hematoma: Due to [facility] for PE.  Heparin discontinued after IVC filter on 6/5.  Recent Labs    02/04/22 0648 02/04/22 1310 02/04/22 1944 02/05/22 0109 02/05/22 7169 02/06/22 0307 02/07/22 0311 02/09/22 0332 02/11/22 0324 02/13/22 0527  HGB 9.7* 9.2* 9.1* 8.3* 8.5* 8.7* 9.5* 9.4* 10.5* 10.5*  -Continue monitoring -Started prophylactic dose Lovenox on 6/12.  Controlled NIDDM-2: A1c 5.5%.  Not on medication at home. Recent Labs  Lab 02/13/22 1619 02/13/22 2215 02/14/22 0755 02/14/22 0757 02/14/22 1132  GLUCAP 145* 121* >600* 165* 173*  -Continue SSI-sensitive.  Distal esophageal thickening: noted on CT.  At [place] junction suspicious for neoplasm or severe esophagitis. Esophageal dysphagia GERD -On dysphagia 2 diet per SLP. -Continue PPI  Acute rhabdomyolysis, now  resolved  Hyponatremia: Stable.  Continue monitoring  Lactic acidosis: Resolved.  Elevated troponin: Likely demand ischemia in the setting of right heart strain from PE.    AKI: Likely prerenal.  Resolved.  Bandemia:Likely demargination from steroid.  Continue monitoring  Acute respiratory failure with hypoxia:Resolved.  On room air.   Pressure skin injury: POA Pressure Injury 01/30/22 Sacrum Right;Medial Stage 2 -  Partial thickness loss of dermis presenting as a shallow open injury with a red, pink wound bed without slough. (Active)  01/30/22 2053  Location: Sacrum  Location Orientation: Right;Medial  Staging: Stage 2 -  Partial thickness loss of dermis presenting as a shallow open injury with a red, pink wound bed without slough.  Wound Description (Comments):   Present on Admission: Yes  Dressing Type Foam - Lift dressing to assess site every shift 02/14/22 0900     Pressure Injury 01/30/22 Coccyx Lower;Medial Stage 2 -  Partial thickness loss of dermis presenting as a shallow open injury with a red, pink wound bed without slough. (Active)  01/30/22 2053  Location: Coccyx  Location Orientation: Lower;Medial  Staging: Stage 2 -  Partial thickness loss of dermis presenting as a shallow open injury with a  red, pink wound bed without slough.  Wound Description (Comments):   Present on Admission: Yes  Dressing Type Foam - Lift dressing to assess site every shift 02/14/22 0900   DVT prophylaxis:  enoxaparin (LOVENOX) injection 40 mg Start: 02/09/22 1800 Place and maintain sequential compression device Start: 02/02/22 1450  Code Status: Full code Family Communication: None at bedside today Level of care: Med-Surg Status is: Inpatient Remains inpatient appropriate because: Safe disposition/SNF.   Final disposition: SNF Consultants:  Pulmonology admitted patient Neurosurgery-signed off Palliative medicine  Sch Meds:  Scheduled Meds:  dexamethasone  4 mg Oral Daily    docusate sodium  100 mg Oral BID   enoxaparin (LOVENOX) injection  40 mg Subcutaneous Q24H   insulin aspart  0-9 Units Subcutaneous TID AC & HS   lisinopril  20 mg Oral Daily   pantoprazole  40 mg Oral BID   polyethylene glycol  17 g Oral BID   senna  1 tablet Oral QHS   Continuous Infusions:  sodium chloride Stopped (02/02/22 0929)   PRN Meds:.acetaminophen, calcium carbonate, haloperidol lactate, iohexol, ondansetron (ZOFRAN) IV  Antimicrobials: Anti-infectives (From admission, onward)    Start     Dose/Rate Route Frequency Ordered Stop   02/03/22 1715  levofloxacin (LEVAQUIN) tablet 500 mg  Status:  Discontinued        500 mg Oral Daily 02/03/22 1627 02/05/22 1355   02/02/22 2045  levofloxacin (LEVAQUIN) IVPB 500 mg  Status:  Discontinued       Note to Pharmacy: Adjust as needed - switching to IV since unable to take PO   500 mg 100 mL/hr over 60 Minutes Intravenous Every 24 hours 02/02/22 1959 02/03/22 1627   02/01/22 1400  levofloxacin (LEVAQUIN) tablet 500 mg  Status:  Discontinued        500 mg Oral Daily 02/01/22 0911 02/02/22 1959   01/31/22 1000  vancomycin (VANCOREADY) IVPB 750 mg/150 mL  Status:  Discontinued        750 mg 150 mL/hr over 60 Minutes Intravenous Every 12 hours 01/31/22 0003 01/31/22 0848   01/31/22 0200  vancomycin (VANCOREADY) IVPB 1500 mg/300 mL  Status:  Discontinued        1,500 mg 150 mL/hr over 120 Minutes Intravenous Every 12 hours 01/30/22 1241 01/30/22 2348   01/30/22 2100  aztreonam (AZACTAM) 2 g in sodium chloride 0.9 % 100 mL IVPB  Status:  Discontinued        2 g 200 mL/hr over 30 Minutes Intravenous Every 8 hours 01/30/22 1240 02/01/22 0923   01/30/22 1245  aztreonam (AZACTAM) 2 g in sodium chloride 0.9 % 100 mL IVPB        2 g 200 mL/hr over 30 Minutes Intravenous  Once 01/30/22 1231 01/30/22 1408   01/30/22 1245  metroNIDAZOLE (FLAGYL) IVPB 500 mg        500 mg 100 mL/hr over 60 Minutes Intravenous  Once 01/30/22 1231 01/30/22 1408    01/30/22 1245  vancomycin (VANCOCIN) IVPB 1000 mg/200 mL premix  Status:  Discontinued        1,000 mg 200 mL/hr over 60 Minutes Intravenous  Once 01/30/22 1231 01/30/22 1239   01/30/22 1245  vancomycin (VANCOREADY) IVPB 2000 mg/400 mL        2,000 mg 200 mL/hr over 120 Minutes Intravenous  Once 01/30/22 1239 01/30/22 1607        I have personally reviewed the following labs and images: CBC: Recent Labs  Lab 02/09/22 0332 02/11/22 0324  02/13/22 0527  WBC 24.9* 20.6* 15.7*  HGB 9.4* 10.5* 10.5*  HCT 29.5* 32.5* 31.6*  MCV 82.4 81.5 83.6  PLT 282 359 287   BMP &GFR Recent Labs  Lab 02/09/22 0332 02/11/22 0324 02/13/22 0527  NA 131* 131* 132*  K 4.0 4.2 4.1  CL 99 99 98  CO2 '24 24 24  '$ GLUCOSE 155* 159* 167*  BUN '16 14 18  '$ CREATININE 0.59 0.59 0.69  CALCIUM 8.1* 8.7* 8.9  MG 2.1 2.1 1.9  PHOS 2.6 3.5 3.5   Estimated Creatinine Clearance: 63.1 mL/min (by C-G formula based on SCr of 0.69 mg/dL). Liver & Pancreas: Recent Labs  Lab 02/09/22 0332 02/11/22 0324 02/13/22 0527  ALBUMIN 2.4* 2.8* 2.8*   No results for input(s): "LIPASE", "AMYLASE" in the last 168 hours.  No results for input(s): "AMMONIA" in the last 168 hours. Diabetic: No results for input(s): "HGBA1C" in the last 72 hours. Recent Labs  Lab 02/13/22 1619 02/13/22 2215 02/14/22 0755 02/14/22 0757 02/14/22 1132  GLUCAP 145* 121* >600* 165* 173*   Cardiac Enzymes: No results for input(s): "CKTOTAL", "CKMB", "CKMBINDEX", "TROPONINI" in the last 168 hours.  No results for input(s): "PROBNP" in the last 8760 hours. Coagulation Profile: No results for input(s): "INR", "PROTIME" in the last 168 hours.  Thyroid Function Tests: No results for input(s): "TSH", "T4TOTAL", "FREET4", "T3FREE", "THYROIDAB" in the last 72 hours. Lipid Profile: No results for input(s): "CHOL", "HDL", "LDLCALC", "TRIG", "CHOLHDL", "LDLDIRECT" in the last 72 hours. Anemia Panel: No results for input(s): "VITAMINB12",  "FOLATE", "FERRITIN", "TIBC", "IRON", "RETICCTPCT" in the last 72 hours.  Urine analysis:    Component Value Date/Time   COLORURINE YELLOW 01/30/2022 1720   APPEARANCEUR CLEAR 01/30/2022 1720   LABSPEC 1.044 (H) 01/30/2022 1720   PHURINE 5.0 01/30/2022 1720   GLUCOSEU NEGATIVE 01/30/2022 1720   HGBUR SMALL (A) 01/30/2022 1720   BILIRUBINUR NEGATIVE 01/30/2022 1720   KETONESUR 20 (A) 01/30/2022 1720   PROTEINUR 30 (A) 01/30/2022 1720   NITRITE POSITIVE (A) 01/30/2022 1720   LEUKOCYTESUR SMALL (A) 01/30/2022 1720   Sepsis Labs: Invalid input(s): "PROCALCITONIN", "LACTICIDVEN"  Microbiology: No results found for this or any previous visit (from the past 240 hour(s)).   Radiology Studies: No results found.    Jennifer Pacheco T. [place]  If 7PM-7AM, please contact night-coverage [email or web address] 02/14/2022, 3:27 PM

## 2022-02-15 DIAGNOSIS — N179 Acute kidney failure, unspecified: Secondary | ICD-10-CM | POA: Diagnosis not present

## 2022-02-15 DIAGNOSIS — G9341 Metabolic encephalopathy: Secondary | ICD-10-CM | POA: Diagnosis not present

## 2022-02-15 DIAGNOSIS — I2609 Other pulmonary embolism with acute cor pulmonale: Secondary | ICD-10-CM | POA: Diagnosis not present

## 2022-02-15 DIAGNOSIS — D62 Acute posthemorrhagic anemia: Secondary | ICD-10-CM | POA: Diagnosis not present

## 2022-02-15 LAB — GLUCOSE, CAPILLARY
Glucose-Capillary: 116 mg/dL — ABNORMAL HIGH (ref 70–99)
Glucose-Capillary: 145 mg/dL — ABNORMAL HIGH (ref 70–99)
Glucose-Capillary: 174 mg/dL — ABNORMAL HIGH (ref 70–99)
Glucose-Capillary: 123 mg/dL — ABNORMAL HIGH (ref 70–99)

## 2022-02-15 NOTE — Progress Notes (Signed)
[facility]                                                                               Jennifer Pacheco, is a 66 y.o. female, DOB - 10/16/1955, DUK:[id number] Admit date - 01/30/2022    Outpatient Primary MD for the patient is [name], NP  LOS - 16  days    Brief summary   66 year old F with PMH of DM-2 and HTN presented to [facility] ED on 6/2 with AMS after she was found down altered in feces and urine by someone who went to check on her.  She reports difficulty getting up after fall on her left buttock.  Unclear how long she was down.  She was found to be hypoxic and hypotensive.  She was started on IV fluids, vasopressors, supplemental oxygen and broad-spectrum antibiotics.  CT chest showed submassive PE with RV/LV ratio 1.2-1.5.  She was started on heparin. CT abd and UA concerning for UTI w/ possible pyelonephritis. CXR unremarkable. 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 NSG consulted recommend MRI and decadron, and transfer to [facility].    Patient was in ICU since arrival at [facility].  She had ABLA likely due to retroperitoneal bleed.  Heparin was discontinued.  She had IVC filter placed.  She came off vasopressor on 6/5.  [facility] on 6/8.  She is currently stable on room air.  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  Neurosurgery recommends outpatient follow-up in 2 to 3 months for reevaluation and possible surgical planning. Therapy evaluation recommending SNF.  APS involved in the case.  Palliative care medicine following.  She is medically stable for discharge at this time.     Assessment & Plan    Assessment and Plan:  Acute pulmonary embolism with acute cor pulmonale  (HCC) Submassive PE with RV to LV ratio of 1.28-1.54.  TTE normal without evidence of right heart strain.  She was in shock on arrival.  She was started on IV heparin, which was later discontinued due to [facility] and retroperitoneal bleed.  She has been off IV heparin since  6/5.   Started prophylactic dose of lovenox on 6/12,    Hypovolemic shock (HCC) Likely from poor p.o. intake and dehydration.  However, she could be in shock from PE and sepsis in the setting of possible UTI/pyelonephritis.  She has been off vasopressors since 6/5.  Now hemodynamically stable.  ABLA likely due to retroperitoneal bleed Recent Labs    02/03/22 0709 02/03/22 1228 02/03/22 1849 02/04/22 0046 02/04/22 0648 02/04/22 1310 02/04/22 1944 02/05/22 0109 02/05/22 0658 02/06/22 0307  HGB 11.9* 11.5* 11.2* 9.5* 9.7* 9.2* 9.1* 8.3* 8.5* 8.7*  Hgb reached nadir at 8.3 and stable now.  Off IV heparin since 6/5. -Continue monitoring -SCD for VTE prophylaxis   Brain mass with vasogenic edema and mass effect CT and MRI brain showed 5.2 x 5.3 cm large  right basal ganglia mass with surrounding edema and 11 mm left midline shift and near complete effacement of right lateral ventricle.   Neurosurgery recommended outpatient follow-up in 2 to 3 months for reevaluation and possible surgical planning.  Continue Decadron 4 mg daily until patient sees neurosurgery. Palliative medicine is on board and following.  APS is involved. Unfortunately no family to help with the goals of care and medical decision at this time.   Acute metabolic encephalopathy Appears to have resolved currently she is alert and oriented x4 and aware of her diagnosis but with limited insight.  Patient appears to have some cognitive deficits.  She had cognitive evaluation by SLP.  reorientation and delirium precautions while minimizing sedating medications.   Abnormal CT scan, esophagus Distal esophageal thickening at GE junction suspicious for  neoplasm or severe esophagitis. Recommend outpatient follow-up with gastroenterology for EGD.  Esophageal dysphagia Likely in the setting of distal esophageal thickening. -Dysphagia 3 diet per SLP Continue with PPI  Lactic acidosis Resolved.  Elevated troponin Likely demand ischemia in the setting of right heart strain from PE.    Hyponatremia Stable.  Continue monitoring  AKI (acute kidney injury) ([place]) Likely prerenal.  Resolved.  Bandemia Likely demargination from steroid.  Improved.  Continue monitoring.  Physical deconditioning Patient lives alone.  Reportedly estranged from family members.  She was found down on feces and urine before arrival.  It seems APS has been involved.  PCCM was able to Google search and talk to patient's brother.  She has a friend visiting.  Palliative medicine consulted for goals of care discussion.  Therapy recommends SNF.  Controlled NIDDM-2 A1c 5.5%. Recent Labs  Lab 02/05/22 1119 02/05/22 1843 02/05/22 2312 02/06/22 0620 02/06/22 1248  GLUCAP 149* 132* 143* 140* 176*   Continue with SSI while inpatient.     Acute respiratory failure with hypoxia (HCC) Resolved.  On room air.   RN Pressure Injury Documentation: Pressure Injury 01/30/22 Sacrum Right;Medial Stage 2 -  Partial thickness loss of dermis presenting as a shallow open injury with a red, pink wound bed without slough. (Active)  01/30/22 2053  Location: Sacrum  Location Orientation: Right;Medial  Staging: Stage 2 -  Partial thickness loss of dermis presenting as a shallow open injury with a red, pink wound bed without slough.  Wound Description (Comments):   Present on Admission: Yes  Dressing Type Foam - Lift dressing to assess site every shift 02/14/22 2130     Pressure Injury 01/30/22 Coccyx Lower;Medial Stage 2 -  Partial thickness loss of dermis presenting as a shallow open injury with a red, pink wound bed without slough. (Active)  01/30/22 2053  Location: Coccyx   Location Orientation: Lower;Medial  Staging: Stage 2 -  Partial thickness loss of dermis presenting as a shallow open injury with a red, pink wound bed without slough.  Wound Description (Comments):   Present on Admission: Yes  Dressing Type Foam - Lift dressing to assess site every shift 02/14/22 0900   Continue with local wound care.   Malnutrition Type:      Malnutrition Characteristics:      Nutrition Interventions:     Estimated body mass index is 24.95 kg/m as calculated from the following:   Height as of this encounter: '5\' 5"'$  (1.651 m).   Weight as of this encounter: 68 kg.  Code Status: Full code DVT Prophylaxis:  enoxaparin (LOVENOX) injection 40 mg Start: 02/09/22 1800 Place and maintain sequential compression device Start: 02/02/22 1450  Level of Care: Level of care: Med-Surg Family Communication: None at bedside.   Disposition Plan:     Remains inpatient appropriate:  SNF.    Consultants:   Pulmonology admitted patient Neurosurgery-signed off Palliative medicine    Antimicrobials:   Anti-infectives (From admission, onward)    Start     Dose/Rate Route Frequency Ordered Stop   02/03/22 1715  levofloxacin (LEVAQUIN) tablet 500 mg  Status:  Discontinued        500 mg Oral Daily 02/03/22 1627 02/05/22 1355   02/02/22 2045  levofloxacin (LEVAQUIN) IVPB 500 mg  Status:  Discontinued       Note to Pharmacy: Adjust as needed - switching to IV since unable to take PO   500 mg 100 mL/hr over 60 Minutes Intravenous Every 24 hours 02/02/22 1959 02/03/22 1627   02/01/22 1400  levofloxacin (LEVAQUIN) tablet 500 mg  Status:  Discontinued        500 mg Oral Daily 02/01/22 0911 02/02/22 1959   01/31/22 1000  vancomycin (VANCOREADY) IVPB 750 mg/150 mL  Status:  Discontinued        750 mg 150 mL/hr over 60 Minutes Intravenous Every 12 hours 01/31/22 0003 01/31/22 0848   01/31/22 0200  vancomycin (VANCOREADY) IVPB 1500 mg/300 mL  Status:  Discontinued         1,500 mg 150 mL/hr over 120 Minutes Intravenous Every 12 hours 01/30/22 1241 01/30/22 2348   01/30/22 2100  aztreonam (AZACTAM) 2 g in sodium chloride 0.9 % 100 mL IVPB  Status:  Discontinued        2 g 200 mL/hr over 30 Minutes Intravenous Every 8 hours 01/30/22 1240 02/01/22 0923   01/30/22 1245  aztreonam (AZACTAM) 2 g in sodium chloride 0.9 % 100 mL IVPB        2 g 200 mL/hr over 30 Minutes Intravenous  Once 01/30/22 1231 01/30/22 1408   01/30/22 1245  metroNIDAZOLE (FLAGYL) IVPB 500 mg        500 mg 100 mL/hr over 60 Minutes Intravenous  Once 01/30/22 1231 01/30/22 1408   01/30/22 1245  vancomycin (VANCOCIN) IVPB 1000 mg/200 mL premix  Status:  Discontinued        1,000 mg 200 mL/hr over 60 Minutes Intravenous  Once 01/30/22 1231 01/30/22 1239   01/30/22 1245  vancomycin (VANCOREADY) IVPB 2000 mg/400 mL        2,000 mg 200 mL/hr over 120 Minutes Intravenous  Once 01/30/22 1239 01/30/22 1607        Medications  Scheduled Meds:  dexamethasone  4 mg Oral Daily   docusate sodium  100 mg Oral BID   enoxaparin (LOVENOX) injection  40 mg Subcutaneous Q24H   insulin aspart  0-9 Units Subcutaneous TID AC & HS   lisinopril  20 mg Oral Daily   pantoprazole  40 mg Oral BID   polyethylene glycol  17 g Oral BID   senna  1 tablet Oral QHS   Continuous Infusions:  sodium chloride Stopped (02/02/22 0929)   PRN Meds:.acetaminophen, calcium carbonate, haloperidol lactate, iohexol, ondansetron (ZOFRAN) IV    Subjective:   Jennifer Pacheco was seen and examined today. No chest pain or sob. No new complaints. Wants to know when she can be discharged . Wants to get out of bed and move around.  Objective:   Vitals:   02/14/22 0900 02/14/22 0920 02/14/22 2030 02/15/22 0851  BP: (!) '79/52 90/60 91/69 '$ 92/69  Pulse: 74  74 85  Resp:  $'17  16 18  'b$ Temp: 98.2 F (36.8 C)  (!) 97.3 F (36.3 C) 98.4 F (36.9 C)  TempSrc: Oral  Oral Oral  SpO2: 99%     Weight:      Height:         Intake/Output Summary (Last 24 hours) at 02/15/2022 0938 Last data filed at 02/14/2022 2000 Gross per 24 hour  Intake --  Output 400 ml  Net -400 ml   Filed Weights   01/31/22 1100 02/07/22 0325 02/12/22 0424  Weight: 69.5 kg 71.1 kg 68 kg     Exam General: Alert and oriented x 3, NAD Cardiovascular: S1 S2 auscultated, no murmurs, RRR Respiratory: Clear to auscultation bilaterally, no wheezing, rales or rhonchi Gastrointestinal: Soft, nontender, nondistended, + bowel sounds Ext: no pedal edema.  Neuro: AAOx3, Cr N's II- XII. Strength 5/5 upper and lower extremities bilaterally Skin: No rashes Psych: Normal affect and demeanor, alert and oriented x3    Data Reviewed:  I have personally reviewed following labs and imaging studies   CBC Lab Results  Component Value Date   WBC 15.7 (H) 02/13/2022   RBC 3.78 (L) 02/13/2022   HGB 10.5 (L) 02/13/2022   HCT 31.6 (L) 02/13/2022   MCV 83.6 02/13/2022   MCH 27.8 02/13/2022   PLT 287 02/13/2022   MCHC 33.2 02/13/2022   RDW 24.3 (H) 02/13/2022   LYMPHSABS 1.2 02/05/2022   MONOABS 1.6 (H) 02/05/2022   EOSABS 0.0 02/05/2022   BASOSABS 0.0 45/80/9983     Last metabolic panel Lab Results  Component Value Date   NA 132 (L) 02/13/2022   K 4.1 02/13/2022   CL 98 02/13/2022   CO2 24 02/13/2022   BUN 18 02/13/2022   CREATININE 0.69 02/13/2022   GLUCOSE 167 (H) 02/13/2022   GFRNONAA >60 02/13/2022   CALCIUM 8.9 02/13/2022   PHOS 3.5 02/13/2022   PROT 4.8 (L) 02/05/2022   ALBUMIN 2.8 (L) 02/13/2022   BILITOT 0.6 02/05/2022   ALKPHOS 30 (L) 02/05/2022   AST 19 02/05/2022   ALT 26 02/05/2022   ANIONGAP 10 02/13/2022    CBG (last 3)  Recent Labs    02/14/22 1631 02/14/22 2027 02/15/22 0806  GLUCAP 171* 151* 116*      Coagulation Profile: No results for input(s): "INR", "PROTIME" in the last 168 hours.   Radiology Studies: No results found.     Hosie Poisson M.D. [facility] 02/15/2022, 9:38  AM  Available via Epic secure chat 7am-7pm After 7 pm, please refer to night coverage provider listed on amion.

## 2022-02-15 NOTE — Plan of Care (Signed)
  Problem: Education: Goal: Ability to describe self-care measures that may prevent or decrease complications (Diabetes Survival Skills Education) will improve Outcome: Progressing Goal: Individualized Educational Video(s) Outcome: Progressing   Problem: Coping: Goal: Ability to adjust to condition or change in health will improve Outcome: Progressing   Problem: Fluid Volume: Goal: Ability to maintain a balanced intake and output will improve Outcome: Progressing   Problem: Health Behavior/Discharge Planning: Goal: Ability to identify and utilize available resources and services will improve Outcome: Progressing Goal: Ability to manage health-related needs will improve Outcome: Progressing   Problem: Skin Integrity: Goal: Risk for impaired skin integrity will decrease Outcome: Progressing   

## 2022-02-16 ENCOUNTER — Inpatient Hospital Stay: Payer: Medicaid Other | Attending: Neurosurgery

## 2022-02-16 DIAGNOSIS — E119 Type 2 diabetes mellitus without complications: Secondary | ICD-10-CM

## 2022-02-16 DIAGNOSIS — N179 Acute kidney failure, unspecified: Secondary | ICD-10-CM | POA: Diagnosis not present

## 2022-02-16 DIAGNOSIS — I2609 Other pulmonary embolism with acute cor pulmonale: Secondary | ICD-10-CM | POA: Diagnosis not present

## 2022-02-16 DIAGNOSIS — G9389 Other specified disorders of brain: Secondary | ICD-10-CM | POA: Diagnosis not present

## 2022-02-16 LAB — CREATININE, SERUM
Creatinine, Ser: 0.69 mg/dL (ref 0.44–1.00)
GFR, Estimated: >60 mL/min (ref >60)

## 2022-02-16 LAB — GLUCOSE, CAPILLARY
Glucose-Capillary: 101 mg/dL — ABNORMAL HIGH (ref 70–99)
Glucose-Capillary: 156 mg/dL — ABNORMAL HIGH (ref 70–99)
Glucose-Capillary: 138 mg/dL — ABNORMAL HIGH (ref 70–99)
Glucose-Capillary: 158 mg/dL — ABNORMAL HIGH (ref 70–99)

## 2022-02-16 MED ORDER — LACTATED RINGERS IV BOLUS
500.0000 mL | Freq: Once | INTRAVENOUS | Status: AC
  Administered 2022-02-16: 500 mL via INTRAVENOUS

## 2022-02-16 NOTE — Progress Notes (Signed)
[facility]                                                                               Jennifer Pacheco, is a 66 y.o. female, DOB - [birth year], WVP:[id number] Admit date - 01/30/2022    Outpatient Primary MD for the patient is [name], NP  LOS - 17  days    Brief summary   66 year old F with PMH of DM-2 and HTN presented to [facility] ED on 6/2 with AMS after she was found down altered in feces and urine by someone who went to check on her.  She reports difficulty getting up after fall on her left buttock.  Unclear how long she was down.  She was found to be hypoxic and hypotensive.  She was started on IV fluids, vasopressors, supplemental oxygen and broad-spectrum antibiotics.  CT chest showed submassive PE with RV/LV ratio 1.2-1.5.  She was started on heparin. CT abd and UA concerning for UTI w/ possible pyelonephritis. CXR unremarkable. 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 NSG consulted recommend MRI and decadron, and transfer to [facility].    Patient was in ICU since arrival at [facility].  She had ABLA likely due to retroperitoneal bleed.  Heparin was discontinued.  She had IVC filter placed.  She came off vasopressor on 6/5.  She was transferred to [facility] on 6/8.  She is currently stable on room air.  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  Neurosurgery recommends outpatient follow-up in 2 to 3 months for reevaluation and possible surgical planning. Therapy evaluation recommending SNF.  APS involved in the case.  Palliative care medicine following.  She is medically stable for discharge at this time.     Assessment & Plan    Assessment and Plan:  Acute pulmonary embolism with acute cor pulmonale  (HCC) Submassive PE with RV to LV ratio of 1.28-1.54.  TTE normal without evidence of right heart strain.  She was in shock on arrival.  She was started on IV heparin, which was later discontinued due to [facility] and retroperitoneal bleed.  She has been off IV heparin since  6/5.   Started prophylactic dose of lovenox on 6/12,    Hypovolemic shock (HCC) Likely from poor p.o. intake and dehydration.  However, she could be in shock from PE and sepsis in the setting of possible UTI/pyelonephritis.  She has been off vasopressors since 6/5.  Now hemodynamically stable.  ABLA likely due to retroperitoneal bleed Recent Labs    02/03/22 0709 02/03/22 1228 02/03/22 1849 02/04/22 0046 02/04/22 0648 02/04/22 1310 02/04/22 1944 02/05/22 0109 02/05/22 0658 02/06/22 0307  HGB 11.9* 11.5* 11.2* 9.5* 9.7* 9.2* 9.1* 8.3* 8.5* 8.7*  Hgb reached nadir at 8.3 and stable now.  Off IV heparin since 6/5. -Continue monitoring -SCD for VTE prophylaxis   Brain mass with vasogenic edema and mass effect CT and MRI brain showed 5.2 x 5.3 cm large  right basal ganglia mass with surrounding edema and 11 mm left midline shift and near complete effacement of right lateral ventricle.   Neurosurgery recommended outpatient follow-up in 2 to 3 months for reevaluation and possible surgical planning.  Continue Decadron 4 mg daily until patient sees neurosurgery. Palliative medicine is on board and following.  APS is involved. Unfortunately no family to help with the goals of care and medical decision at this time.   Acute metabolic encephalopathy Appears to have resolved currently she is alert and oriented x4 and aware of her diagnosis but with limited insight.  Patient appears to have some cognitive deficits.  She had cognitive evaluation by SLP.  reorientation and delirium precautions while minimizing sedating medications.   Abnormal CT scan, esophagus Distal esophageal thickening at GE junction suspicious for  neoplasm or severe esophagitis. Recommend outpatient follow-up with gastroenterology for EGD.  Esophageal dysphagia Likely in the setting of distal esophageal thickening. -Dysphagia 3 diet per SLP Continue with PPI  Lactic acidosis Resolved.  Elevated troponin Likely demand ischemia in the setting of right heart strain from PE.    Hyponatremia Stable.  Continue monitoring, check bmp IN AM.   AKI (acute kidney injury) ([place]) Likely prerenal.  Resolved.  Bandemia Likely demargination from steroid.  Improved.   Check CBC in am.   Physical deconditioning Patient lives alone.  Reportedly estranged from family members.  She was found down on feces and urine before arrival.  It seems APS has been involved.  PCCM was able to Google search and talk to patient's brother.  She has a friend visiting.  Palliative medicine consulted for goals of care discussion.  Therapy recommends SNF.  Controlled NIDDM-2 A1c 5.5%. Recent Labs  Lab 02/05/22 1119 02/05/22 1843 02/05/22 2312 02/06/22 0620 02/06/22 1248  GLUCAP 149* 132* 143* 140* 176*   Continue with SSI while inpatient.     Acute respiratory failure with hypoxia (HCC) Resolved.  On room air.   Hypotension:  Pt asymptomatic.  D/c lisinopril.    RN Pressure Injury Documentation: Pressure Injury 01/30/22 Sacrum Right;Medial Stage 2 -  Partial thickness loss of dermis presenting as a shallow open injury with a red, pink wound bed without slough. (Active)  01/30/22 2053  Location: Sacrum  Location Orientation: Right;Medial  Staging: Stage 2 -  Partial thickness loss of dermis presenting as a shallow open injury with a red, pink wound bed without slough.  Wound Description (Comments):   Present on Admission: Yes  Dressing Type Foam - Lift dressing to assess site every shift 02/15/22 2143     Pressure Injury 01/30/22 Coccyx Lower;Medial Stage 2 -  Partial thickness loss of dermis presenting as a shallow open injury with a red,  pink wound bed without slough. (Active)  01/30/22 2053  Location: Coccyx  Location Orientation: Lower;Medial  Staging: Stage 2 -  Partial thickness loss of dermis presenting as a shallow open injury with a red, pink wound bed without slough.  Wound Description (Comments):   Present on Admission: Yes  Dressing Type Foam - Lift dressing to assess site every shift 02/15/22 0851   Continue with local wound care.       Estimated body mass index is 24.87 kg/m as calculated from the following:   Height as of this encounter: '5\' 5"'$  (1.651 m).   Weight as of this encounter: 67.8 kg.  Code Status: Full code DVT Prophylaxis:  enoxaparin (LOVENOX) injection 40 mg Start: 02/09/22 1800 Place and maintain sequential compression device Start:  02/02/22 1450   Level of Care: Level of care: Med-Surg Family Communication: None at bedside.   Disposition Plan:     Remains inpatient appropriate:  SNF.    Consultants:   Pulmonology admitted patient Neurosurgery-signed off Palliative medicine    Antimicrobials:   Anti-infectives (From admission, onward)    Start     Dose/Rate Route Frequency Ordered Stop   02/03/22 1715  levofloxacin (LEVAQUIN) tablet 500 mg  Status:  Discontinued        500 mg Oral Daily 02/03/22 1627 02/05/22 1355   02/02/22 2045  levofloxacin (LEVAQUIN) IVPB 500 mg  Status:  Discontinued       Note to Pharmacy: Adjust as needed - switching to IV since unable to take PO   500 mg 100 mL/hr over 60 Minutes Intravenous Every 24 hours 02/02/22 1959 02/03/22 1627   02/01/22 1400  levofloxacin (LEVAQUIN) tablet 500 mg  Status:  Discontinued        500 mg Oral Daily 02/01/22 0911 02/02/22 1959   01/31/22 1000  vancomycin (VANCOREADY) IVPB 750 mg/150 mL  Status:  Discontinued        750 mg 150 mL/hr over 60 Minutes Intravenous Every 12 hours 01/31/22 0003 01/31/22 0848   01/31/22 0200  vancomycin (VANCOREADY) IVPB 1500 mg/300 mL  Status:  Discontinued        1,500 mg 150 mL/hr  over 120 Minutes Intravenous Every 12 hours 01/30/22 1241 01/30/22 2348   01/30/22 2100  aztreonam (AZACTAM) 2 g in sodium chloride 0.9 % 100 mL IVPB  Status:  Discontinued        2 g 200 mL/hr over 30 Minutes Intravenous Every 8 hours 01/30/22 1240 02/01/22 0923   01/30/22 1245  aztreonam (AZACTAM) 2 g in sodium chloride 0.9 % 100 mL IVPB        2 g 200 mL/hr over 30 Minutes Intravenous  Once 01/30/22 1231 01/30/22 1408   01/30/22 1245  metroNIDAZOLE (FLAGYL) IVPB 500 mg        500 mg 100 mL/hr over 60 Minutes Intravenous  Once 01/30/22 1231 01/30/22 1408   01/30/22 1245  vancomycin (VANCOCIN) IVPB 1000 mg/200 mL premix  Status:  Discontinued        1,000 mg 200 mL/hr over 60 Minutes Intravenous  Once 01/30/22 1231 01/30/22 1239   01/30/22 1245  vancomycin (VANCOREADY) IVPB 2000 mg/400 mL        2,000 mg 200 mL/hr over 120 Minutes Intravenous  Once 01/30/22 1239 01/30/22 1607        Medications  Scheduled Meds:  dexamethasone  4 mg Oral Daily   docusate sodium  100 mg Oral BID   enoxaparin (LOVENOX) injection  40 mg Subcutaneous Q24H   insulin aspart  0-9 Units Subcutaneous TID AC & HS   pantoprazole  40 mg Oral BID   polyethylene glycol  17 g Oral BID   senna  1 tablet Oral QHS   Continuous Infusions:  sodium chloride Stopped (02/02/22 0929)   PRN Meds:.acetaminophen, calcium carbonate, haloperidol lactate, iohexol, ondansetron (ZOFRAN) IV    Subjective:   Nyeisha Goodall was seen and examined today. NO NEW COMPLAINTS. Wants to know when she can be discharged.  Objective:   Vitals:   02/15/22 1956 02/16/22 0500 02/16/22 0721 02/16/22 0741  BP: 97/79  (!) 137/103 90/65  Pulse: 80  (!) 50 75  Resp: 16     Temp: 97.7 F (36.5 C)  97.6 F (36.4 C)   TempSrc:  Oral  Oral   SpO2: 98%  (!) 87% 98%  Weight:  67.8 kg    Height:        Intake/Output Summary (Last 24 hours) at 02/16/2022 1046 Last data filed at 02/16/2022 0600 Gross per 24 hour  Intake 120 ml  Output  1300 ml  Net -1180 ml    Filed Weights   02/07/22 0325 02/12/22 0424 02/16/22 0500  Weight: 71.1 kg 68 kg 67.8 kg     Exam General exam: Appears calm and comfortable  Respiratory system: Clear to auscultation. Respiratory effort normal. Cardiovascular system: S1 & S2 heard, RRR. No JVD,  No pedal edema. Gastrointestinal system: Abdomen is nondistended, soft and nontender. Normal bowel sounds heard. Central nervous system: Alert and oriented. No focal neurological deficits. Extremities: Symmetric 5 x 5 power. Skin: No rashes, lesions or ulcers Psychiatry: Mood & affect appropriate.     Data Reviewed:  I have personally reviewed following labs and imaging studies   CBC Lab Results  Component Value Date   WBC 15.7 (H) 02/13/2022   RBC 3.78 (L) 02/13/2022   HGB 10.5 (L) 02/13/2022   HCT 31.6 (L) 02/13/2022   MCV 83.6 02/13/2022   MCH 27.8 02/13/2022   PLT 287 02/13/2022   MCHC 33.2 02/13/2022   RDW 24.3 (H) 02/13/2022   LYMPHSABS 1.2 02/05/2022   MONOABS 1.6 (H) 02/05/2022   EOSABS 0.0 02/05/2022   BASOSABS 0.0 59/93/5701     Last metabolic panel Lab Results  Component Value Date   NA 132 (L) 02/13/2022   K 4.1 02/13/2022   CL 98 02/13/2022   CO2 24 02/13/2022   BUN 18 02/13/2022   CREATININE 0.69 02/16/2022   GLUCOSE 167 (H) 02/13/2022   GFRNONAA >60 02/16/2022   CALCIUM 8.9 02/13/2022   PHOS 3.5 02/13/2022   PROT 4.8 (L) 02/05/2022   ALBUMIN 2.8 (L) 02/13/2022   BILITOT 0.6 02/05/2022   ALKPHOS 30 (L) 02/05/2022   AST 19 02/05/2022   ALT 26 02/05/2022   ANIONGAP 10 02/13/2022    CBG (last 3)  Recent Labs    02/15/22 1557 02/15/22 2150 02/16/22 0723  GLUCAP 174* 123* 101*       Coagulation Profile: No results for input(s): "INR", "PROTIME" in the last 168 hours.   Radiology Studies: No results found.     Hosie Poisson M.D. [facility] 02/16/2022, 10:46 AM  Available via Epic secure chat 7am-7pm After 7 pm, please refer to  night coverage provider listed on amion.

## 2022-02-16 NOTE — Progress Notes (Addendum)
Physical Therapy Treatment Patient Details Name: Jennifer Pacheco MRN: [id number] DOB: 1956-05-07 Today's Date: 02/16/2022   History of Present Illness Pt is 66 yo female who was found down in her home and taken to [facility]. 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 Pt with retroperitoneal bleed and IVC filter placed 6/5. PMH: DM2, HTN.    PT Comments    Patient preferring to rest this afternoon, but able to persuade her to participate in PT session. She requires multi-modal cues to progress to sitting EOB, standing and walking. Patient continues to requrie 24/7 supervision/assistance.     Recommendations for follow up therapy are one component of a multi-disciplinary discharge planning process, led by the attending physician.  Recommendations may be updated based on patient status, additional functional criteria and insurance authorization.  Follow Up Recommendations  Skilled nursing-short term rehab (<3 hours/day)     Assistance Recommended at Discharge Frequent or constant Supervision/Assistance  Patient can return home with the following A lot of help with walking and/or transfers;A little help with bathing/dressing/bathroom;Assistance with cooking/housework;Direct supervision/assist for medications management;Direct supervision/assist for financial management;Assist for transportation;Help with stairs or ramp for entrance   Equipment Recommendations  Rolling walker (2 wheels)    Recommendations for Other Services       Precautions / Restrictions Precautions Precautions: Fall Precaution Comments: HOH; L side inattention Restrictions Weight Bearing Restrictions: No     Mobility  Bed Mobility Overal bed mobility: Needs Assistance Bed Mobility: Rolling, Sidelying to Sit Rolling: Min assist Sidelying to sit: Min assist       General bed mobility comments: pt required max cues to stay on task of coming to sit on EOB; guiding assist  to initiate movements and then pt would join into activity    Transfers Overall transfer level: Needs assistance Equipment used: Rolling walker (2 wheels) Transfers: Sit to/from Stand, Bed to chair/wheelchair/BSC Sit to Stand: Min assist, +2 safety/equipment           General transfer comment: max cues for sequencing with RW (pushes RW too far ahead and tends to initiate sitting when she is still too far from the chair; cues for reaching back to find armrest    Ambulation/Gait Ambulation/Gait assistance: Min assist, +2 safety/equipment Gait Distance (Feet): 15 Feet Assistive device: Rolling walker (2 wheels) Gait Pattern/deviations: Shuffle, Trunk flexed, Step-to pattern Gait velocity: decreased     General Gait Details: small low steps using RW too far advanced, assist to manage RW especially with turns   Chief Strategy Officer    Modified Rankin (Stroke Patients Only)       Balance Overall balance assessment: Needs assistance Sitting-balance support: Feet supported, No upper extremity supported Sitting balance-Leahy Scale: Fair Sitting balance - Comments: supervision for sitting balance   Standing balance support: Bilateral upper extremity supported, Reliant on assistive device for balance Standing balance-Leahy Scale: Poor Standing balance comment: reliant on external support                            Cognition Arousal/Alertness: Awake/alert Behavior During Therapy: Flat affect Overall Cognitive Status: No family/caregiver present to determine baseline cognitive functioning Area of Impairment: Attention, Following commands, Safety/judgement, Awareness, Problem solving                   Current Attention Level: Sustained   Following  Commands: Follows one step commands inconsistently, Follows one step commands with increased time Safety/Judgement: Decreased awareness of safety, Decreased awareness of  deficits Awareness: Intellectual Problem Solving: Difficulty sequencing, Requires verbal cues, Requires tactile cues, Slow processing, Decreased initiation General Comments: wanting to rest and therefore moving very slowly and with decr initiation        Exercises      General Comments        Pertinent Vitals/Pain Pain Assessment Pain Assessment: Faces Faces Pain Scale: No hurt    Home Living                          Prior Function            PT Goals (current goals can now be found in the care plan section) Acute Rehab PT Goals Patient Stated Goal: to rest; pt unable to state therapy-related goal due to cognition Time For Goal Achievement: 03/02/22 Potential to Achieve Goals: Good Progress towards PT goals: Progressing toward goals    Frequency    Min 3X/week      PT Plan Current plan remains appropriate    Co-evaluation              AM-PAC PT "6 Clicks" Mobility   Outcome Measure  Help needed turning from your back to your side while in a flat bed without using bedrails?: A Lot Help needed moving from lying on your back to sitting on the side of a flat bed without using bedrails?: A Little Help needed moving to and from a bed to a chair (including a wheelchair)?: A Lot (mod cues) Help needed standing up from a chair using your arms (e.g., wheelchair or bedside chair)?: A Little Help needed to walk in hospital room?: Total Help needed climbing 3-5 steps with a railing? : Total 6 Click Score: 12    End of Session Equipment Utilized During Treatment: Gait belt Activity Tolerance: Patient limited by fatigue Patient left: in chair;with call bell/phone within reach;with chair alarm set Nurse Communication: Mobility status;Other (comment) (pt perseverating on asking for a bra, may participate better in therapies once this issue is resolved?) PT Visit Diagnosis: Unsteadiness on feet (R26.81);Difficulty in walking, not elsewhere classified  (R26.2)     Time: 2831-5176 PT Time Calculation (min) (ACUTE ONLY): 22 min  Charges:  $Gait Training: 8-22 mins                      Arby Barrette, PT [place]  Office [phone]    Rexanne Mano 02/16/2022, 2:57 PM

## 2022-02-17 DIAGNOSIS — I2609 Other pulmonary embolism with acute cor pulmonale: Secondary | ICD-10-CM | POA: Diagnosis not present

## 2022-02-17 DIAGNOSIS — N179 Acute kidney failure, unspecified: Secondary | ICD-10-CM | POA: Diagnosis not present

## 2022-02-17 DIAGNOSIS — G9389 Other specified disorders of brain: Secondary | ICD-10-CM | POA: Diagnosis not present

## 2022-02-17 DIAGNOSIS — E119 Type 2 diabetes mellitus without complications: Secondary | ICD-10-CM | POA: Diagnosis not present

## 2022-02-17 LAB — CBC WITH DIFFERENTIAL/PLATELET
Abs Immature Granulocytes: 0.05 10*3/uL (ref 0.00–0.07)
Basophils Absolute: 0 10*3/uL (ref 0.0–0.1)
Basophils Relative: 0 %
Eosinophils Absolute: 0 10*3/uL (ref 0.0–0.5)
Eosinophils Relative: 0 %
HCT: 34.2 % — ABNORMAL LOW (ref 36.0–46.0)
Hemoglobin: 11.4 g/dL — ABNORMAL LOW (ref 12.0–15.0)
Immature Granulocytes: 1 %
Lymphocytes Relative: 16 %
Lymphs Abs: 1.5 10*3/uL (ref 0.7–4.0)
MCH: 28.5 pg (ref 26.0–34.0)
MCHC: 33.3 g/dL (ref 30.0–36.0)
MCV: 85.5 fL (ref 80.0–100.0)
Monocytes Absolute: 0.6 10*3/uL (ref 0.1–1.0)
Monocytes Relative: 7 %
Neutro Abs: 7.3 10*3/uL (ref 1.7–7.7)
Neutrophils Relative %: 76 %
Platelets: 334 10*3/uL (ref 150–400)
RBC: 4 MIL/uL (ref 3.87–5.11)
RDW: 23.9 % — ABNORMAL HIGH (ref 11.5–15.5)
WBC: 9.6 10*3/uL (ref 4.0–10.5)
nRBC: 0 % (ref 0.0–0.2)

## 2022-02-17 LAB — GLUCOSE, CAPILLARY
Glucose-Capillary: 134 mg/dL — ABNORMAL HIGH (ref 70–99)
Glucose-Capillary: 90 mg/dL (ref 70–99)
Glucose-Capillary: 193 mg/dL — ABNORMAL HIGH (ref 70–99)
Glucose-Capillary: 133 mg/dL — ABNORMAL HIGH (ref 70–99)

## 2022-02-17 NOTE — Progress Notes (Signed)
[facility]                                                                               Jennifer Pacheco, is a 66 y.o. female, DOB - [birth year], EUM:[id number] Admit date - 01/30/2022    Outpatient Primary MD for the patient is Jennifer Eth, NP  LOS - 18  days    Brief summary   66 year old F with PMH of DM-2 and HTN presented to [facility] ED on 6/2 with AMS after she was found down altered in feces and urine by someone who went to check on her.  She reports difficulty getting up after fall on her left buttock.  Unclear how long she was down.  She was found to be hypoxic and hypotensive.  She was started on IV fluids, vasopressors, supplemental oxygen and broad-spectrum antibiotics.  CT chest showed submassive PE with RV/LV ratio 1.2-1.5.  She was started on heparin. CT abd and UA concerning for UTI w/ possible pyelonephritis. CXR unremarkable. 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 NSG consulted recommend MRI and decadron, and transfer to [facility].    Patient was in ICU since arrival at [facility].  She had ABLA likely due to retroperitoneal bleed.  Heparin was discontinued.  She had IVC filter placed.  She came off vasopressor on 6/5.  She was [facility] on 6/8.  She is currently stable on room air.  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  Neurosurgery recommends outpatient follow-up in 2 to 3 months for reevaluation and possible surgical planning. Therapy evaluation recommending SNF.  APS involved in the case.  Palliative care medicine following.  She is medically stable for discharge at this time.     Assessment & Plan    Assessment and Plan:  Acute pulmonary embolism with acute cor pulmonale  (HCC) Submassive PE with RV to LV ratio of 1.28-1.54.  TTE normal without evidence of right heart strain.  She was in shock on arrival.  She was started on IV heparin, which was later discontinued due to [facility] and retroperitoneal bleed.  She has been off IV heparin since  6/5.   Started prophylactic dose of lovenox on 6/12, . She is on RA, and denies any sob and oxygen sats wnl on RA.    Hypovolemic shock (HCC) Likely from poor p.o. intake and dehydration.  However, she could be in shock from PE and sepsis in the setting of possible UTI/pyelonephritis.  She has been off vasopressors since 6/5.  Now hemodynamically stable.  ABLA likely due to retroperitoneal bleed Off anti coagulation.  Hemoglobin stable around 11.  -Continue monitoring -SCD for VTE prophylaxis   Brain mass with vasogenic edema and mass effect CT and MRI brain showed 5.2 x 5.3 cm large right basal ganglia mass with surrounding edema and 11 mm left midline shift and near complete effacement of right lateral ventricle.   Neurosurgery recommended outpatient follow-up in  2 to 3 months for reevaluation and possible surgical planning.   Continue Decadron 4 mg daily until patient sees neurosurgery. Palliative medicine is on board and following.  APS is involved. Unfortunately no family to help with the goals of care and medical decision at this time. Pt denies any headache or nausea or vomiting.    Acute metabolic encephalopathy Appears to have resolved currently she is alert and oriented x4 and aware of her diagnosis but with limited insight.  Patient appears to have some cognitive deficits.  She had cognitive evaluation by SLP.  reorientation and delirium precautions while minimizing sedating medications.   Abnormal CT scan, esophagus Distal esophageal thickening at GE junction suspicious for neoplasm or severe esophagitis. Recommend outpatient follow-up with gastroenterology for EGD.  Esophageal dysphagia Likely in the  setting of distal esophageal thickening. -Dysphagia 3 diet per SLP Continue with PPI  Lactic acidosis Resolved.  Elevated troponin Likely demand ischemia in the setting of right heart strain from PE.    Hyponatremia Stable.  Continue monitoring, check bmp IN AM.   AKI (acute kidney injury) ([place]) Likely prerenal.  Resolved.  Bandemia Likely demargination from steroid.  Resolved.   Physical deconditioning Patient lives alone.  Reportedly estranged from family members.  She was found down on feces and urine before arrival.  It seems APS has been involved.  PCCM was able to Google search and talk to patient's brother.  She has a friend visiting.  Palliative medicine consulted for goals of care discussion.  Therapy recommends SNF.  Controlled NIDDM-2 A1c 5.5%. Recent Labs  Lab 02/05/22 1119 02/05/22 1843 02/05/22 2312 02/06/22 0620 02/06/22 1248  GLUCAP 149* 132* 143* 140* 176*   Continue with SSI while inpatient.     Acute respiratory failure with hypoxia (HCC) Resolved.  On room air.   Hypotension:  Pt asymptomatic. Resolved.  D/c lisinopril.    RN Pressure Injury Documentation: Pressure Injury 01/30/22 Sacrum Right;Medial Stage 2 -  Partial thickness loss of dermis presenting as a shallow open injury with a red, pink wound bed without slough. (Active)  01/30/22 2053  Location: Sacrum  Location Orientation: Right;Medial  Staging: Stage 2 -  Partial thickness loss of dermis presenting as a shallow open injury with a red, pink wound bed without slough.  Wound Description (Comments):   Present on Admission: Yes  Dressing Type Foam - Lift dressing to assess site every shift 02/17/22 0830     Pressure Injury 01/30/22 Coccyx Lower;Medial Stage 2 -  Partial thickness loss of dermis presenting as a shallow open injury with a red, pink wound bed without slough. (Active)  01/30/22 2053  Location: Coccyx  Location Orientation: Lower;Medial  Staging: Stage 2 -  Partial  thickness loss of dermis presenting as a shallow open injury with a red, pink wound bed without slough.  Wound Description (Comments):   Present on Admission: Yes  Dressing Type Foam - Lift dressing to assess site every shift 02/17/22 0830   Continue with local wound care.       Estimated body mass index is 24.87 kg/m as calculated from the following:   Height as of this encounter: '5\' 5"'$  (1.651 m).   Weight as of this encounter: 67.8 kg.  Code Status: Full code DVT Prophylaxis:  enoxaparin (LOVENOX) injection 40 mg Start: 02/09/22 1800 Place and maintain sequential compression device Start: 02/02/22 1450   Level of Care: Level of care: Med-Surg Family Communication: None at bedside.   Disposition Plan:  Remains inpatient appropriate:  SNF.    Consultants:   Pulmonology admitted patient Neurosurgery-signed off Palliative medicine    Antimicrobials:   Anti-infectives (From admission, onward)    Start     Dose/Rate Route Frequency Ordered Stop   02/03/22 1715  levofloxacin (LEVAQUIN) tablet 500 mg  Status:  Discontinued        500 mg Oral Daily 02/03/22 1627 02/05/22 1355   02/02/22 2045  levofloxacin (LEVAQUIN) IVPB 500 mg  Status:  Discontinued       Note to Pharmacy: Adjust as needed - switching to IV since unable to take PO   500 mg 100 mL/hr over 60 Minutes Intravenous Every 24 hours 02/02/22 1959 02/03/22 1627   02/01/22 1400  levofloxacin (LEVAQUIN) tablet 500 mg  Status:  Discontinued        500 mg Oral Daily 02/01/22 0911 02/02/22 1959   01/31/22 1000  vancomycin (VANCOREADY) IVPB 750 mg/150 mL  Status:  Discontinued        750 mg 150 mL/hr over 60 Minutes Intravenous Every 12 hours 01/31/22 0003 01/31/22 0848   01/31/22 0200  vancomycin (VANCOREADY) IVPB 1500 mg/300 mL  Status:  Discontinued        1,500 mg 150 mL/hr over 120 Minutes Intravenous Every 12 hours 01/30/22 1241 01/30/22 2348   01/30/22 2100  aztreonam (AZACTAM) 2 g in sodium chloride 0.9 % 100  mL IVPB  Status:  Discontinued        2 g 200 mL/hr over 30 Minutes Intravenous Every 8 hours 01/30/22 1240 02/01/22 0923   01/30/22 1245  aztreonam (AZACTAM) 2 g in sodium chloride 0.9 % 100 mL IVPB        2 g 200 mL/hr over 30 Minutes Intravenous  Once 01/30/22 1231 01/30/22 1408   01/30/22 1245  metroNIDAZOLE (FLAGYL) IVPB 500 mg        500 mg 100 mL/hr over 60 Minutes Intravenous  Once 01/30/22 1231 01/30/22 1408   01/30/22 1245  vancomycin (VANCOCIN) IVPB 1000 mg/200 mL premix  Status:  Discontinued        1,000 mg 200 mL/hr over 60 Minutes Intravenous  Once 01/30/22 1231 01/30/22 1239   01/30/22 1245  vancomycin (VANCOREADY) IVPB 2000 mg/400 mL        2,000 mg 200 mL/hr over 120 Minutes Intravenous  Once 01/30/22 1239 01/30/22 1607        Medications  Scheduled Meds:  dexamethasone  4 mg Oral Daily   docusate sodium  100 mg Oral BID   enoxaparin (LOVENOX) injection  40 mg Subcutaneous Q24H   insulin aspart  0-9 Units Subcutaneous TID AC & HS   pantoprazole  40 mg Oral BID   polyethylene glycol  17 g Oral BID   senna  1 tablet Oral QHS   Continuous Infusions:  sodium chloride Stopped (02/02/22 0929)   PRN Meds:.acetaminophen, calcium carbonate, haloperidol lactate, iohexol, ondansetron (ZOFRAN) IV    Subjective:   Tanasha Menees was seen and examined today. No complaints.  Objective:   Vitals:   02/16/22 0721 02/16/22 0741 02/16/22 1707 02/17/22 1040  BP: (!) 137/103 90/65 107/78 (!) 112/45  Pulse: (!) 50 75 71 70  Resp:   18 17  Temp: 97.6 F (36.4 C)  97.9 F (36.6 C) 97.6 F (36.4 C)  TempSrc: Oral  Oral Oral  SpO2: (!) 87% 98% 98% (!) 88%  Weight:      Height:        Intake/Output Summary (  Last 24 hours) at 02/17/2022 1403 Last data filed at 02/17/2022 1916 Gross per 24 hour  Intake --  Output 800 ml  Net -800 ml    Filed Weights   02/07/22 0325 02/12/22 0424 02/16/22 0500  Weight: 71.1 kg 68 kg 67.8 kg     Exam General exam: Appears  calm and comfortable  Respiratory system: Clear to auscultation. Respiratory effort normal. Cardiovascular system: S1 & S2 heard, RRR. No JVD,  No pedal edema. Gastrointestinal system: Abdomen is nondistended, soft and nontender. Normal bowel sounds heard. Central nervous system: Alert and oriented to person and place only..  Extremities: Symmetric 5 x 5 power. Skin: No rashes, lesions or ulcers Psychiatry: Mood & affect appropriate.      Data Reviewed:  I have personally reviewed following labs and imaging studies   CBC Lab Results  Component Value Date   WBC 9.6 02/17/2022   RBC 4.00 02/17/2022   HGB 11.4 (L) 02/17/2022   HCT 34.2 (L) 02/17/2022   MCV 85.5 02/17/2022   MCH 28.5 02/17/2022   PLT 334 02/17/2022   MCHC 33.3 02/17/2022   RDW 23.9 (H) 02/17/2022   LYMPHSABS 1.5 02/17/2022   MONOABS 0.6 02/17/2022   EOSABS 0.0 02/17/2022   BASOSABS 0.0 60/60/0459     Last metabolic panel Lab Results  Component Value Date   NA 132 (L) 02/13/2022   K 4.1 02/13/2022   CL 98 02/13/2022   CO2 24 02/13/2022   BUN 18 02/13/2022   CREATININE 0.69 02/16/2022   GLUCOSE 167 (H) 02/13/2022   GFRNONAA >60 02/16/2022   CALCIUM 8.9 02/13/2022   PHOS 3.5 02/13/2022   PROT 4.8 (L) 02/05/2022   ALBUMIN 2.8 (L) 02/13/2022   BILITOT 0.6 02/05/2022   ALKPHOS 30 (L) 02/05/2022   AST 19 02/05/2022   ALT 26 02/05/2022   ANIONGAP 10 02/13/2022    CBG (last 3)  Recent Labs    02/16/22 2142 02/17/22 0816 02/17/22 1146  GLUCAP 158* 134* 90       Coagulation Profile: No results for input(s): "INR", "PROTIME" in the last 168 hours.   Radiology Studies: No results found.     Hosie Poisson M.D. [facility] 02/17/2022, 2:03 PM  Available via Epic secure chat 7am-7pm After 7 pm, please refer to night coverage provider listed on amion.

## 2022-02-17 NOTE — Plan of Care (Signed)
  Problem: Pain Managment: Goal: General experience of comfort will improve Outcome: Progressing   Problem: Safety: Goal: Ability to remain free from injury will improve Outcome: Progressing   Problem: Education: Goal: Understanding of CV disease, CV risk reduction, and recovery process will improve Outcome: Progressing   Problem: Activity: Goal: Ability to return to baseline activity level will improve Outcome: Progressing   Problem: Cardiovascular: Goal: Ability to achieve and maintain adequate cardiovascular perfusion will improve Outcome: Progressing

## 2022-02-17 NOTE — Progress Notes (Addendum)
Speech Language Pathology Treatment: Dysphagia;Cognitive-Linquistic  Patient Details Name: Jennifer Pacheco MRN: [id number] DOB: May 29, 1956 Today's Date: 02/17/2022 Time: 1200-1220 SLP Time Calculation (min) (ACUTE ONLY): 15 min  Assessment / Plan / Recommendation Clinical Impression  Patient seen by SLP for skilled treatment session focused on dysphagia goals. Patient in bed, awake and alert when SLP arrived. She was pleasant, tangential and impulsive without any awareness. She perseverated on "they chopped up my soup". (Seems to be referring to Dys 2 (minced/fine chop) texture solids that she is getting). She is edentulous and says she does not have any dentures. She was not able to tell SLP any food types she could or would eat prior to hospitalization as she would become perseverative on a meatloaf that she was served here at one time. She finally did agree to try some solid textures and ate a could cheetoes which were crunchy texture. She did have to spit out part of it after prolonged mastication. SLP is recommending to continue on Dys 2 solids, thin liquids diet and will continue to follow for cognitive and swallow function goals.    HPI HPI: Patient is a 66 year old female presented to [facility] ED on 6/2 with AMS found down at home. Admitted and found to be hypoxic and hypotensive, CT chest showing submassive PE, CT abd and UA concerning for UTI w/ possible pyelonephritis. 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 PHMx: HTN, DMT2      SLP Plan  Continue with current plan of care      Recommendations for follow up therapy are one component of a multi-disciplinary discharge planning process, led by the attending physician.  Recommendations may be updated based on patient status, additional functional criteria and insurance  authorization.    Recommendations  Diet recommendations: Dysphagia 2 (fine chop);Thin liquid Liquids provided via: Straw;Cup Medication Administration: Whole meds with liquid Supervision: Staff to assist with self feeding;Full supervision/cueing for compensatory strategies Compensations: Slow rate;Small sips/bites;Follow solids with liquid;Minimize environmental distractions Postural Changes and/or Swallow Maneuvers: Seated upright 90 degrees                Oral Care Recommendations: Oral care BID Follow Up Recommendations: Skilled nursing-short term rehab (<3 hours/day) Assistance recommended at discharge: Frequent or constant Supervision/Assistance SLP Visit Diagnosis: Dysphagia, oral phase (R13.11) Plan: Continue with current plan of care           [name], MA, CCC-SLP Speech Therapy

## 2022-02-17 NOTE — Progress Notes (Signed)
PT Cancellation Note  Patient Details Name: Jennifer Pacheco MRN: [id number] DOB: 1956-07-23   Cancelled Treatment:    Reason Eval/Treat Not Completed: Patient declined, no reason specified. Patient states she is tired. Has been putting clothes on and taking them off. (Not sure if with OT?) Will re-attempt at later time as appropriate.    Abiola Behring 02/17/2022, 2:29 PM

## 2022-02-18 DIAGNOSIS — E119 Type 2 diabetes mellitus without complications: Secondary | ICD-10-CM | POA: Diagnosis not present

## 2022-02-18 DIAGNOSIS — I2609 Other pulmonary embolism with acute cor pulmonale: Secondary | ICD-10-CM | POA: Diagnosis not present

## 2022-02-18 DIAGNOSIS — G9389 Other specified disorders of brain: Secondary | ICD-10-CM | POA: Diagnosis not present

## 2022-02-18 DIAGNOSIS — N179 Acute kidney failure, unspecified: Secondary | ICD-10-CM | POA: Diagnosis not present

## 2022-02-18 LAB — GLUCOSE, CAPILLARY
Glucose-Capillary: 93 mg/dL (ref 70–99)
Glucose-Capillary: 123 mg/dL — ABNORMAL HIGH (ref 70–99)
Glucose-Capillary: 249 mg/dL — ABNORMAL HIGH (ref 70–99)
Glucose-Capillary: 180 mg/dL — ABNORMAL HIGH (ref 70–99)

## 2022-02-18 NOTE — Progress Notes (Signed)
Physical Therapy Treatment Patient Details Name: Jennifer Pacheco MRN: [id number] DOB: [birth year] Today's Date: 02/18/2022   History of Present Illness Pt is 66 yo female who was found down in her home and taken to [facility]. 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 Pt with retroperitoneal bleed and IVC filter placed 6/5. PMH: DM2, HTN.    PT Comments    Pt received in recliner, agreeable to therapy session with encouragement, pt self-limiting due to c/o fatigue. Pt needing max encouragement and mostly +2 modA for ~60f gait task at bedside and sit<>stands due to decreased initiation, poor insight into current mobility level and cognitive deficit. Pt refusing to attempt household distance gait trial, but did tolerate short standing trial for orthostatic assessment at bedside, BP and HR stable supine/seated and standing, although pt c/o nausea. Pt continues to benefit from PT services to progress toward functional mobility goals.   Recommendations for follow up therapy are one component of a multi-disciplinary discharge planning process, led by the attending physician.  Recommendations may be updated based on patient status, additional functional criteria and insurance authorization.  Follow Up Recommendations  Skilled nursing-short term rehab (<3 hours/day)     Assistance Recommended at Discharge Frequent or constant Supervision/Assistance  Patient can return home with the following A lot of help with walking and/or transfers;A little help with bathing/dressing/bathroom;Assistance with cooking/housework;Direct supervision/assist for medications management;Direct supervision/assist for financial management;Assist for transportation;Help with stairs or ramp for entrance   Equipment Recommendations  Rolling walker (2 wheels)    Recommendations for Other Services       Precautions / Restrictions Precautions Precautions: Fall Precaution Comments: HOH;  L side inattention Restrictions Weight Bearing Restrictions: No     Mobility  Bed Mobility Overal bed mobility: Needs Assistance Bed Mobility: Sit to Sidelying Rolling: Min assist Sidelying to sit: Mod assist, Min assist Supine to sit: Min assist, +2 for physical assistance     General bed mobility comments: Pt initiating return to supine x3 occasions and able to sit back upright with min/modA but more due to behaviors than due to needing physical assist to sit back up. Pt needing trunk and BLE assist to return safely to sidelying at end of session, able to bridge hips briefly to adjust bed pads after return to supine    Transfers Overall transfer level: Needs assistance Equipment used: Rolling walker (2 wheels) Transfers: Sit to/from Stand, Bed to chair/wheelchair/BSC Sit to Stand: Min assist, +2 safety/equipment, Mod assist   Step pivot transfers: Mod assist, +2 physical assistance      Lateral/Scoot Transfers: Max assist, +2 physical assistance General transfer comment: max cues for sequencing with RW (pushes RW too far ahead and tends to initiate sitting when she is still too far from the bed). Increased assist to stand as pt fatigued, but partially due to poor initiation/poor insight into positioning and need to stand to get [facility] to [facility] MaxA +2 for lateral seated scooting toward HOB due to cognitive deficit and pt c/o fatigue at end of session.    Ambulation/Gait             Pre-gait activities: +2 assist with RW small shuffled steps ~718fprior to turning to sit EOB, pt refusing further due to c/o fatigue, despite max encouragement from PTA and OT in room with her to reattempt gait longer distance in room       Balance Overall balance assessment: Needs assistance Sitting-balance support: Feet supported,  No upper extremity supported Sitting balance-Leahy Scale: Fair Sitting balance - Comments: supervision for sitting balance, at times min guard due to pt  behaviors   Standing balance support: Bilateral upper extremity supported, Reliant on assistive device for balance Standing balance-Leahy Scale: Poor Standing balance comment: reliant on external support, tending to posterior lean with fatigue                            Cognition Arousal/Alertness: Awake/alert Behavior During Therapy: Flat affect Overall Cognitive Status: No family/caregiver present to determine baseline cognitive functioning Area of Impairment: Attention, Following commands, Safety/judgement, Awareness, Problem solving                   Current Attention Level: Sustained   Following Commands: Follows one step commands inconsistently, Follows one step commands with increased time Safety/Judgement: Decreased awareness of safety, Decreased awareness of deficits Awareness: Intellectual Problem Solving: Difficulty sequencing, Requires verbal cues, Requires tactile cues, Slow processing, Decreased initiation General Comments: wanting to rest and therefore moving very slowly and with decr initiation. Pt making contradictory statements, talking about wanting to walk but when given assist to stand and initiate gait trial, pt quickly turning back to bed and refusing to attempt gait progression. Heavy encouragement to continue mobility progression but pt adamant to refuse gait.        Exercises Other Exercises Other Exercises: STS x 5 reps (total, with rest breaks and max encouragement)    General Comments General comments (skin integrity, edema, etc.): seated BP 112/86, HR 90; standing BP 111/82 HR 103; Supine BP 115/88 HR 85 after mobility      Pertinent Vitals/Pain Pain Assessment Pain Assessment: Faces Faces Pain Scale: Hurts a little bit Pain Location: area where gait belt placed while pt given lift assist, gait belt adjusted Pain Descriptors / Indicators: Discomfort, Grimacing Pain Intervention(s): Limited activity within patient's tolerance,  Monitored during session, Repositioned           PT Goals (current goals can now be found in the care plan section) Acute Rehab PT Goals Patient Stated Goal: to rest; pt unable to state therapy-related goal due to cognition PT Goal Formulation: Patient unable to participate in goal setting Time For Goal Achievement: 03/02/22 Progress towards PT goals: Progressing toward goals (slow progress, cognition limiting participation)    Frequency    Min 3X/week      PT Plan Current plan remains appropriate    Co-evaluation PT/OT/SLP Co-Evaluation/Treatment: Yes Reason for Co-Treatment: Necessary to address cognition/behavior during functional activity;For patient/therapist safety;To address functional/ADL transfers PT goals addressed during session: Mobility/safety with mobility;Balance;Proper use of DME;Strengthening/ROM        AM-PAC PT "6 Clicks" Mobility   Outcome Measure  Help needed turning from your back to your side while in a flat bed without using bedrails?: A Little Help needed moving from lying on your back to sitting on the side of a flat bed without using bedrails?: A Lot Help needed moving to and from a bed to a chair (including a wheelchair)?: A Lot (mod cues) Help needed standing up from a chair using your arms (e.g., wheelchair or bedside chair)?: A Lot Help needed to walk in hospital room?: Total Help needed climbing 3-5 steps with a railing? : Total 6 Click Score: 11    End of Session Equipment Utilized During Treatment: Gait belt Activity Tolerance: Patient limited by fatigue;Treatment limited secondary to agitation (cognitive deficit limiting insight into importance  of participation) Patient left: with call bell/phone within reach;in bed;with bed alarm set;Other (comment) (pt heels floated) Nurse Communication: Mobility status PT Visit Diagnosis: Unsteadiness on feet (R26.81);Difficulty in walking, not elsewhere classified (R26.2)     Time: 6433-2951 PT  Time Calculation (min) (ACUTE ONLY): 25 min  Charges:  $Therapeutic Activity: 8-22 mins                     Monette Omara P., PTA Acute Rehabilitation Services Secure Chat Preferred 9a-5:30pm Office: [place] 02/18/2022, 2:17 PM

## 2022-02-18 NOTE — Progress Notes (Signed)
Occupational Therapy Treatment Patient Details Name: Jennifer Pacheco MRN: [id number] DOB: 08-10-1956 Today's Date: 02/18/2022   History of present illness Pt is 66 yo female who was found down in her home and taken to [facility]. 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 Pt with retroperitoneal bleed and IVC filter placed 6/5. PMH: DM2, HTN.   OT comments  Pt progressing towards goals, requiring min-mod A for ADLs, completes min -max A +2 for transfers at times due to pt fatiguing/wanting to lay down on bed. Pt min-mod A +2 for bed mobility. Pt reports wanting to stand and walk, however does not make attempts to initiate and at times requiring increased assistance. Pt reporting dizziness during session, however BP WNL (see PT note). Pt presenting with impairments listed below, will follow acutely. Continue to recommend SNF at d/c.   Recommendations for follow up therapy are one component of a multi-disciplinary discharge planning process, led by the attending physician.  Recommendations may be updated based on patient status, additional functional criteria and insurance authorization.    Follow Up Recommendations  Skilled nursing-short term rehab (<3 hours/day)    Assistance Recommended at Discharge Frequent or constant Supervision/Assistance  Patient can return home with the following  A little help with walking and/or transfers;A little help with bathing/dressing/bathroom;Assistance with cooking/housework;Direct supervision/assist for medications management;Direct supervision/assist for financial management   Equipment Recommendations  None recommended by OT;Other (comment) (defer to next venue of care)    Recommendations for Other Services PT consult    Precautions / Restrictions Precautions Precautions: Fall Precaution Comments: HOH; L side inattention Restrictions Weight Bearing Restrictions: No       Mobility Bed Mobility Overal bed  mobility: Needs Assistance Bed Mobility: Sit to Sidelying Rolling: Min assist Sidelying to sit: Mod assist, Min assist Supine to sit: Min assist, +2 for physical assistance          Transfers Overall transfer level: Needs assistance Equipment used: Rolling walker (2 wheels) Transfers: Sit to/from Stand, Bed to chair/wheelchair/BSC Sit to Stand: Min assist, +2 safety/equipment, Mod assist     Step pivot transfers: Mod assist, +2 physical assistance    Lateral/Scoot Transfers: Max assist, +2 physical assistance       Balance Overall balance assessment: Needs assistance Sitting-balance support: Feet supported, No upper extremity supported Sitting balance-Leahy Scale: Fair Sitting balance - Comments: supervision for sitting balance, at times min guard due to pt behaviors   Standing balance support: Bilateral upper extremity supported, Reliant on assistive device for balance Standing balance-Leahy Scale: Poor Standing balance comment: reliant on external support, tending to posterior lean with fatigue                           ADL either performed or assessed with clinical judgement   ADL Overall ADL's : Needs assistance/impaired                 Upper Body Dressing : Minimal assistance;Sitting Upper Body Dressing Details (indicate cue type and reason): to don gown Lower Body Dressing: Moderate assistance;Sitting/lateral leans;Minimal assistance   Toilet Transfer: Rolling walker (2 wheels);BSC/3in1;Stand-pivot;Moderate assistance Toilet Transfer Details (indicate cue type and reason): simulated x2         Functional mobility during ADLs: Minimal assistance;+2 for physical assistance;Rolling walker (2 wheels) General ADL Comments: pt wanting to sit/lay down frequently during session requiring +2 assistanace to stand and transfer    Extremity/Trunk Assessment  Upper Extremity Assessment Upper Extremity Assessment: Generalized weakness   Lower Extremity  Assessment Lower Extremity Assessment: Defer to PT evaluation        Vision   Vision Assessment?: Vision impaired- to be further tested in functional context Additional Comments: R gaze   Perception Perception Perception: Not tested   Praxis Praxis Praxis: Not tested    Cognition Arousal/Alertness: Awake/alert Behavior During Therapy: Flat affect Overall Cognitive Status: No family/caregiver present to determine baseline cognitive functioning Area of Impairment: Attention, Following commands, Safety/judgement, Awareness, Problem solving                   Current Attention Level: Sustained   Following Commands: Follows one step commands inconsistently, Follows one step commands with increased time Safety/Judgement: Decreased awareness of safety, Decreased awareness of deficits Awareness: Intellectual Problem Solving: Difficulty sequencing, Requires verbal cues, Requires tactile cues, Slow processing, Decreased initiation General Comments: pt stating she wants to walk/get up to use bathroom, however when encouraged wants to return to bed        Exercises      Shoulder Instructions       General Comments pt reporting dizziness/nausea, BP taken WNL (see PT note)    Pertinent Vitals/ Pain       Pain Assessment Pain Assessment: Faces Pain Score: 2  Faces Pain Scale: Hurts a little bit Pain Location: stomach, nausea Pain Descriptors / Indicators: Discomfort, Grimacing Pain Intervention(s): Limited activity within patient's tolerance, Monitored during session, Repositioned  Home Living                                          Prior Functioning/Environment              Frequency  Min 2X/week        Progress Toward Goals  OT Goals(current goals can now be found in the care plan section)  Progress towards OT goals: Progressing toward goals  Acute Rehab OT Goals Patient Stated Goal: to lay down OT Goal Formulation: With  patient Time For Goal Achievement: 03/04/22 Potential to Achieve Goals: Good ADL Goals Pt Will Perform Grooming: Independently;standing Pt Will Perform Upper Body Bathing: Independently;sitting;standing Pt Will Perform Lower Body Bathing: Independently;sit to/from stand Pt Will Perform Upper Body Dressing: Independently;sitting;standing Pt Will Perform Lower Body Dressing: Independently;sit to/from stand Pt Will Transfer to Toilet: Independently;ambulating;bedside commode Pt Will Perform Toileting - Clothing Manipulation and hygiene: Independently;sit to/from stand Additional ADL Goal #1: Pt will be independent in and out of bed  Plan Discharge plan remains appropriate    Co-evaluation    PT/OT/SLP Co-Evaluation/Treatment: Yes Reason for Co-Treatment: Necessary to address cognition/behavior during functional activity PT goals addressed during session: Mobility/safety with mobility;Balance;Proper use of DME;Strengthening/ROM OT goals addressed during session: ADL's and self-care      AM-PAC OT "6 Clicks" Daily Activity     Outcome Measure   Help from another person eating meals?: A Little Help from another person taking care of personal grooming?: A Little Help from another person toileting, which includes using toliet, bedpan, or urinal?: A Little Help from another person bathing (including washing, rinsing, drying)?: A Little Help from another person to put on and taking off regular upper body clothing?: A Little Help from another person to put on and taking off regular lower body clothing?: A Lot 6 Click Score: 17    End of Session Equipment Utilized  During Treatment: Rolling walker (2 wheels);Gait belt  OT Visit Diagnosis: Unsteadiness on feet (R26.81);Other abnormalities of gait and mobility (R26.89);History of falling (Z91.81);Other symptoms and signs involving cognitive function   Activity Tolerance Patient tolerated treatment well   Patient Left with call bell/phone  within reach;in bed;with bed alarm set   Nurse Communication Mobility status        Time: 7356-7014 OT Time Calculation (min): 27 min  Charges: OT General Charges $OT Visit: 1 Visit OT Treatments $Self Care/Home Management : 8-22 mins  Lynnda Child, OTD, OTR/L Acute Rehab [phone]) [phone]   Kaylyn Lim 02/18/2022, 2:34 PM

## 2022-02-18 NOTE — Progress Notes (Signed)
[facility]                                                                               Jennifer Pacheco, is a 65 y.o. female, DOB - 12/01/1955, XBD:[id number] Admit date - 01/30/2022    Outpatient Primary MD for the patient is [name], NP  LOS - 19  days    Brief summary   66 year old F with PMH of DM-2 and HTN presented to [facility] ED on 6/2 with AMS after she was found down altered in feces and urine by someone who went to check on her.  She reports difficulty getting up after fall on her left buttock.  Unclear how long she was down.  She was found to be hypoxic and hypotensive.  She was started on IV fluids, vasopressors, supplemental oxygen and broad-spectrum antibiotics.  CT chest showed submassive PE with RV/LV ratio 1.2-1.5.  She was started on heparin. CT abd and UA concerning for UTI w/ possible pyelonephritis. CXR unremarkable. 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 NSG consulted recommend MRI and decadron, and transfer to [facility].    Patient was in ICU since arrival at [facility].  She had ABLA likely due to retroperitoneal bleed.  Heparin was discontinued.  She had IVC filter placed.  She came off vasopressor on 6/5.  She was transferred to [facility] on 6/8.  She is currently stable on room air.  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  Neurosurgery recommends outpatient follow-up in 2 to 3 months for reevaluation and possible surgical planning. Therapy evaluation recommending SNF.  APS involved in the case.  Palliative care medicine following.  She is medically stable for discharge at this time. Pt seen and examined, no new complaints.      Assessment & Plan    Assessment and Plan:  Acute  pulmonary embolism with acute cor pulmonale (HCC) Submassive PE with RV to LV ratio of 1.28-1.54.  TTE normal without evidence of right heart strain.  She was in shock on arrival.  She was started on IV heparin, which was later discontinued due to [facility] and retroperitoneal bleed.  She has been off IV heparin since  6/5.   Started prophylactic dose of lovenox on 6/12, . She is on RA, and denies any sob and oxygen sats wnl on RA.    Hypovolemic shock (HCC) Likely from poor p.o. intake and dehydration.  However, she could be in shock from PE and sepsis in the setting of possible UTI/pyelonephritis.  She has been off vasopressors since 6/5.  Now hemodynamically stable.  ABLA likely due to retroperitoneal bleed Off anti coagulation.  Hemoglobin stable around 11.  -Continue monitoring -SCD for VTE prophylaxis   Brain mass with vasogenic edema and mass effect CT and MRI brain showed 5.2 x 5.3 cm large right basal ganglia mass with surrounding edema and 11 mm left midline shift and near complete effacement of right lateral  ventricle.   Neurosurgery recommended outpatient follow-up in 2 to 3 months for reevaluation and possible surgical planning.   Continue Decadron 4 mg daily until patient sees neurosurgery. Palliative medicine is on board and following.  APS is involved. Unfortunately no family to help with the goals of care and medical decision at this time. No new complaints. No headache.    Acute metabolic encephalopathy Appears to have resolved currently she is alert and oriented x4 and aware of her diagnosis but with limited insight.  Patient appears to have some cognitive deficits.  She had cognitive evaluation by SLP.  reorientation and delirium precautions while minimizing sedating medications.   Abnormal CT scan, esophagus Distal esophageal thickening at GE junction suspicious for neoplasm or severe esophagitis. Recommend outpatient follow-up with gastroenterology for EGD. Pt denies  any heart burn.   Esophageal dysphagia Likely in the setting of distal esophageal thickening. -Dysphagia 3 diet per SLP Continue with PPI  Lactic acidosis Resolved.  Elevated troponin Likely demand ischemia in the setting of right heart strain from PE.   Pt denies any chest pain or sob.   Hyponatremia Stable.   AKI (acute kidney injury) ([place]) Likely prerenal.  Resolved. Repeat creatinine is at baseline.   Bandemia Likely demargination from steroid.  Resolved.   Physical deconditioning Patient lives alone.  Reportedly estranged from family members.  She was found down on feces and urine before arrival.  It seems APS has been involved.  PCCM was able to Google search and talk to patient's brother.  She has a friend visiting.  Palliative medicine consulted for goals of care discussion.  Therapy recommends SNF. Currently waiting for guardian ship.    Controlled NIDDM-2 A1c 5.5%. Recent Labs  Lab 02/05/22 1119 02/05/22 1843 02/05/22 2312 02/06/22 0620 02/06/22 1248  GLUCAP 149* 132* 143* 140* 176*   Continue with SSI while inpatient, while on decadron.     Acute respiratory failure with hypoxia (HCC) Resolved.  On room air.   Hypotension:  Pt asymptomatic. Resolved.  D/c lisinopril.    RN Pressure Injury Documentation: Pressure Injury 01/30/22 Sacrum Right;Medial Stage 2 -  Partial thickness loss of dermis presenting as a shallow open injury with a red, pink wound bed without slough. (Active)  01/30/22 2053  Location: Sacrum  Location Orientation: Right;Medial  Staging: Stage 2 -  Partial thickness loss of dermis presenting as a shallow open injury with a red, pink wound bed without slough.  Wound Description (Comments):   Present on Admission: Yes  Dressing Type Foam - Lift dressing to assess site every shift 02/18/22 0900     Pressure Injury 01/30/22 Coccyx Lower;Medial Stage 2 -  Partial thickness loss of dermis presenting as a shallow open injury with a  red, pink wound bed without slough. (Active)  01/30/22 2053  Location: Coccyx  Location Orientation: Lower;Medial  Staging: Stage 2 -  Partial thickness loss of dermis presenting as a shallow open injury with a red, pink wound bed without slough.  Wound Description (Comments):   Present on Admission: Yes  Dressing Type Foam - Lift dressing to assess site every shift 02/18/22 0900   Continue with local wound care.       Estimated body mass index is 24.87 kg/m as calculated from the following:   Height as of this encounter: '5\' 5"'$  (1.651 m).   Weight as of this encounter: 67.8 kg.  Code Status: Full code DVT Prophylaxis:  enoxaparin (LOVENOX) injection 40 mg Start: 02/09/22 1800 Place and  maintain sequential compression device Start: 02/02/22 1450   Level of Care: Level of care: Med-Surg Family Communication: None at bedside.   Disposition Plan:     Remains inpatient appropriate:  SNF.    Consultants:   Pulmonology admitted patient Neurosurgery-signed off Palliative medicine    Antimicrobials:   Anti-infectives (From admission, onward)    Start     Dose/Rate Route Frequency Ordered Stop   02/03/22 1715  levofloxacin (LEVAQUIN) tablet 500 mg  Status:  Discontinued        500 mg Oral Daily 02/03/22 1627 02/05/22 1355   02/02/22 2045  levofloxacin (LEVAQUIN) IVPB 500 mg  Status:  Discontinued       Note to Pharmacy: Adjust as needed - switching to IV since unable to take PO   500 mg 100 mL/hr over 60 Minutes Intravenous Every 24 hours 02/02/22 1959 02/03/22 1627   02/01/22 1400  levofloxacin (LEVAQUIN) tablet 500 mg  Status:  Discontinued        500 mg Oral Daily 02/01/22 0911 02/02/22 1959   01/31/22 1000  vancomycin (VANCOREADY) IVPB 750 mg/150 mL  Status:  Discontinued        750 mg 150 mL/hr over 60 Minutes Intravenous Every 12 hours 01/31/22 0003 01/31/22 0848   01/31/22 0200  vancomycin (VANCOREADY) IVPB 1500 mg/300 mL  Status:  Discontinued        1,500 mg 150  mL/hr over 120 Minutes Intravenous Every 12 hours 01/30/22 1241 01/30/22 2348   01/30/22 2100  aztreonam (AZACTAM) 2 g in sodium chloride 0.9 % 100 mL IVPB  Status:  Discontinued        2 g 200 mL/hr over 30 Minutes Intravenous Every 8 hours 01/30/22 1240 02/01/22 0923   01/30/22 1245  aztreonam (AZACTAM) 2 g in sodium chloride 0.9 % 100 mL IVPB        2 g 200 mL/hr over 30 Minutes Intravenous  Once 01/30/22 1231 01/30/22 1408   01/30/22 1245  metroNIDAZOLE (FLAGYL) IVPB 500 mg        500 mg 100 mL/hr over 60 Minutes Intravenous  Once 01/30/22 1231 01/30/22 1408   01/30/22 1245  vancomycin (VANCOCIN) IVPB 1000 mg/200 mL premix  Status:  Discontinued        1,000 mg 200 mL/hr over 60 Minutes Intravenous  Once 01/30/22 1231 01/30/22 1239   01/30/22 1245  vancomycin (VANCOREADY) IVPB 2000 mg/400 mL        2,000 mg 200 mL/hr over 120 Minutes Intravenous  Once 01/30/22 1239 01/30/22 1607        Medications  Scheduled Meds:  dexamethasone  4 mg Oral Daily   docusate sodium  100 mg Oral BID   enoxaparin (LOVENOX) injection  40 mg Subcutaneous Q24H   insulin aspart  0-9 Units Subcutaneous TID AC & HS   pantoprazole  40 mg Oral BID   polyethylene glycol  17 g Oral BID   senna  1 tablet Oral QHS   Continuous Infusions:  sodium chloride Stopped (02/02/22 0929)   PRN Meds:.acetaminophen, calcium carbonate, haloperidol lactate, iohexol, ondansetron (ZOFRAN) IV    Subjective:   Jennifer Pacheco was seen and examined today. No chest pain or sob.  Objective:   Vitals:   02/16/22 1707 02/17/22 1040 02/17/22 1953 02/18/22 0813  BP: 107/78 (!) 112/45 118/83 111/69  Pulse: 71 70 69 71  Resp: 18 17    Temp: 97.9 F (36.6 C) 97.6 F (36.4 C) 97.6 F (36.4 C) 98.8 F (  37.1 C)  TempSrc: Oral Oral Oral Oral  SpO2: 98% (!) 88% 98% 97%  Weight:      Height:       No intake or output data in the 24 hours ending 02/18/22 1556  Filed Weights   02/07/22 0325 02/12/22 0424 02/16/22 0500   Weight: 71.1 kg 68 kg 67.8 kg     Exam General exam: Appears calm and comfortable  Respiratory system: Clear to auscultation. Respiratory effort normal. Cardiovascular system: S1 & S2 heard, RRR. No JVD,  No pedal edema. Gastrointestinal system: Abdomen is nondistended, soft and nontender. Normal bowel sounds heard. Central nervous system: Alert and oriented to person and place.  Extremities: Symmetric 5 x 5 power. Skin: No rashes, lesions or ulcers Psychiatry: Mood & affect appropriate.       Data Reviewed:  I have personally reviewed following labs and imaging studies   CBC Lab Results  Component Value Date   WBC 9.6 02/17/2022   RBC 4.00 02/17/2022   HGB 11.4 (L) 02/17/2022   HCT 34.2 (L) 02/17/2022   MCV 85.5 02/17/2022   MCH 28.5 02/17/2022   PLT 334 02/17/2022   MCHC 33.3 02/17/2022   RDW 23.9 (H) 02/17/2022   LYMPHSABS 1.5 02/17/2022   MONOABS 0.6 02/17/2022   EOSABS 0.0 02/17/2022   BASOSABS 0.0 35/45/6256     Last metabolic panel Lab Results  Component Value Date   NA 132 (L) 02/13/2022   K 4.1 02/13/2022   CL 98 02/13/2022   CO2 24 02/13/2022   BUN 18 02/13/2022   CREATININE 0.69 02/16/2022   GLUCOSE 167 (H) 02/13/2022   GFRNONAA >60 02/16/2022   CALCIUM 8.9 02/13/2022   PHOS 3.5 02/13/2022   PROT 4.8 (L) 02/05/2022   ALBUMIN 2.8 (L) 02/13/2022   BILITOT 0.6 02/05/2022   ALKPHOS 30 (L) 02/05/2022   AST 19 02/05/2022   ALT 26 02/05/2022   ANIONGAP 10 02/13/2022    CBG (last 3)  Recent Labs    02/18/22 0814 02/18/22 1141 02/18/22 1542  GLUCAP 93 123* 249*       Coagulation Profile: No results for input(s): "INR", "PROTIME" in the last 168 hours.   Radiology Studies: No results found.     Hosie Poisson M.D. [facility] 02/18/2022, 3:56 PM  Available via Epic secure chat 7am-7pm After 7 pm, please refer to night coverage provider listed on amion.

## 2022-02-18 NOTE — Progress Notes (Signed)
Patient ID: [name], female   DOB: Jun 28, 1956, 66 y.o.   MRN: [id number]    Progress Note from the Palliative Medicine Team at [facility]   Patient Name: Jennifer Pacheco        Date: 02/18/2022 DOB: 1955/10/30  Age: 66 y.o. MRN#: [id number] Attending Physician: Hosie Poisson, MD Primary Care Physician: [name], NP Admit Date: 01/30/2022   Medical records reviewed   66 y.o. female   admitted on 01/30/2022 with  PMH of HTN, DMT2 presents to [facility] ED on 6/2 with AMS.  Patient was found down on floor at home altered covered in feces and urine and someone checked on her today on 6/2.  EMS transported patient to [facility].   Unsure exactly how long she has been down.  Patient noted to be hypoxic and hypotensive. Afebrile. Started on IV fluids for hypotension. Sats improved on [place].    CT chest showing submassive PE with RV/LV ratio 1.2-1.5. Started on heparin. Broad spectrum abx started. CT abd and UA concerning for UTI w/ possible pyelonephritis. CXR unremarkable.    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 NSG consulted recommend MRI and decadron.   Retroperitoneal bleed, requiring transfusion.  I examined [name] at the bedside this morning.  She is alert, appetite is good however she remains confused, although she is aware that she has a brain tumor she remains without insight or capacity for medical decisions at this time.  Transition of care team working with Adult Protective Services to secure guardianship.  I spoke with Michelle/TOC team this morning and there is a court date set for July 7 at 10 AM, moving forward with guardianship.  This is important as we move forward with treatment option decision, and's advanced directive decisions and anticipatory care needs.  Adult Protective Services with [facility] involved       [place]  Evans/LCSW with [facility] [phone]   Yesterday I spoke with sister Bethena Roys by telephone, she again reiterates that family is unwilling to take any responsibility for [name] at this time.  She hopes that social services will be able to find placement for Ms. Breuer    PMT will continue to support holistically  Wadie Lessen NP  Palliative Medicine Team Team Phone # [phone] Pager [phone]

## 2022-02-18 NOTE — Plan of Care (Signed)
  Problem: Coping: Goal: Ability to adjust to condition or change in health will improve Outcome: Progressing   Problem: Nutritional: Goal: Maintenance of adequate nutrition will improve Outcome: Progressing Goal: Progress toward achieving an optimal weight will improve Outcome: Progressing   Problem: Skin Integrity: Goal: Risk for impaired skin integrity will decrease Outcome: Progressing   Problem: Nutrition: Goal: Adequate nutrition will be maintained Outcome: Progressing   Problem: Pain Managment: Goal: General experience of comfort will improve Outcome: Progressing

## 2022-02-19 DIAGNOSIS — N179 Acute kidney failure, unspecified: Secondary | ICD-10-CM | POA: Diagnosis not present

## 2022-02-19 DIAGNOSIS — I2609 Other pulmonary embolism with acute cor pulmonale: Secondary | ICD-10-CM | POA: Diagnosis not present

## 2022-02-19 DIAGNOSIS — G9389 Other specified disorders of brain: Secondary | ICD-10-CM | POA: Diagnosis not present

## 2022-02-19 DIAGNOSIS — Z515 Encounter for palliative care: Secondary | ICD-10-CM | POA: Diagnosis not present

## 2022-02-19 DIAGNOSIS — R410 Disorientation, unspecified: Secondary | ICD-10-CM | POA: Diagnosis not present

## 2022-02-19 DIAGNOSIS — E119 Type 2 diabetes mellitus without complications: Secondary | ICD-10-CM | POA: Diagnosis not present

## 2022-02-19 LAB — GLUCOSE, CAPILLARY
Glucose-Capillary: 151 mg/dL — ABNORMAL HIGH (ref 70–99)
Glucose-Capillary: 79 mg/dL (ref 70–99)
Glucose-Capillary: 153 mg/dL — ABNORMAL HIGH (ref 70–99)

## 2022-02-19 NOTE — TOC Progression Note (Signed)
Transition of Care [facility]) - Progression Note    Patient Details  Name: Jennifer Pacheco MRN: [id number] Date of Birth: 06/14/1956  Transition of Care [facility]) CM/SW [place], RN Phone Number: 02/19/2022, 10:19 AM  Clinical Narrative:    CM called and spoke with Doreen Beam, APS SW and he states that Interim Guardianship Hearing with [facility] is scheduled for March 06, 2022 at 10 am.  CM and MSW sent clinicals to Amalia Hailey, APS for clinical support and TOC team will not be needed to participate in the court hearing.  Attending physician, Dr. Karleen Hampshire was updated on interim guardianship date.  [place], APS plans to bring the patient new belongings in the next week since APS is unable to access the patient's apartment nor retrieve clothing from the apartment due to the dwelling being described as "deplorable conditions and infested with bugs" per APS.  CM and MSW with DTP Team will continue to follow the patient for scheduled guardianship court date and SNF placement.   Expected Discharge Plan: (P) Skilled Nursing Facility Barriers to Discharge: (P) Continued Medical Work up, Unsafe home situation, Family Issues  Expected Discharge Plan and Services Expected Discharge Plan: (P) [place] In-house Referral: Clinical Social Work, Hospice / [place] Acute Care Choice: [place] Living arrangements for the past 2 months: Single Family Home                                       Social Determinants of Health (SDOH) Interventions    Readmission Risk Interventions    02/11/2022    2:16 PM  Readmission Risk Prevention Plan  Transportation Screening Complete  PCP or Specialist Appt within 5-7 Days Complete  Home Care Screening Complete  Medication Review (RN CM) Complete

## 2022-02-19 NOTE — Progress Notes (Signed)
Speech Language Pathology Treatment: Dysphagia  Patient Details Name: Jennifer Pacheco MRN: [id number] DOB: 11-07-1955 Today's Date: 02/19/2022 Time: 9242-6834 SLP Time Calculation (min) (ACUTE ONLY): 15 min  Assessment / Plan / Recommendation Clinical Impression  Patient seen by SLP for skilled treatment session focused on dysphagia goals. Patient was awake and alert, requesting "cinnamon Life cereal". SLP got cereal which was a mix of granola and corn flakes and patient ate pieces of this without milk. She did exhibit prolonged mastication and although she exhibited a couple delayed coughs with first bite, she did not exhibit any subsequent coughing or throat clearing during or after PO intake. SLP is recommending to upgrade patient's diet to Dys 3 (mechanical soft) from Dys 2 (fine chop/minced). SLP will continue to follow patient for dysphagia and cognitive impairment.    HPI HPI: Patient is a 66 year old female presented to [facility] ED on 6/2 with AMS found down at home. Admitted and found to be hypoxic and hypotensive, CT chest showing submassive PE, CT abd and UA concerning for UTI w/ possible pyelonephritis. 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 PHMx: HTN, DMT2      SLP Plan  Continue with current plan of care      Recommendations for follow up therapy are one component of a multi-disciplinary discharge planning process, led by the attending physician.  Recommendations may be updated based on patient status, additional functional criteria and insurance authorization.    Recommendations  Diet recommendations: Dysphagia 3 (mechanical soft);Thin liquid Liquids provided via: Straw;Cup Medication Administration: Whole meds with liquid Supervision: Intermittent supervision to cue for compensatory strategies Compensations: Slow  rate;Small sips/bites;Follow solids with liquid;Minimize environmental distractions Postural Changes and/or Swallow Maneuvers: Seated upright 90 degrees                Oral Care Recommendations: Oral care BID Follow Up Recommendations: Skilled nursing-short term rehab (<3 hours/day) Assistance recommended at discharge: Frequent or constant Supervision/Assistance SLP Visit Diagnosis: Dysphagia, oral phase (R13.11) Plan: Continue with current plan of care         [name], MA, CCC-SLP Speech Therapy

## 2022-02-19 NOTE — Progress Notes (Signed)
[facility]                                                                               Jennifer Pacheco, is a 66 y.o. female, DOB - Jan 02, 1956, [id number] Admit date - 01/30/2022    Outpatient Primary MD for the patient is [name], NP  LOS - 20  days    Brief summary   66 year old F with PMH of DM-2 and HTN presented to [facility] ED on 6/2 with AMS after she was found down altered in feces and urine by someone who went to check on her.  She reports difficulty getting up after fall on her left buttock.  Unclear how long she was down.  She was found to be hypoxic and hypotensive.  She was started on IV fluids, vasopressors, supplemental oxygen and broad-spectrum antibiotics.  CT chest showed submassive PE with RV/LV ratio 1.2-1.5.  She was started on heparin. CT abd and UA concerning for UTI w/ possible pyelonephritis. CXR unremarkable. 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 NSG consulted recommend MRI and decadron, and transfer to [facility].    Patient was in ICU since arrival at [facility].  She had ABLA likely due to retroperitoneal bleed.  Heparin was discontinued.  She had IVC filter placed.  She came off vasopressor on 6/5.  She was transferred to [facility] on 6/8.  She is currently stable on room air.  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  Neurosurgery recommends outpatient follow-up in 2 to 3 months for reevaluation and possible surgical planning. Therapy evaluation recommending SNF.  APS involved in the case.  Palliative care medicine following.  She is medically stable for discharge at this time. She has a court date set for July 7 at 10 AM, moving forward with guardianship. No events overnight.       Assessment & Plan    Assessment and Plan:  Acute pulmonary embolism with acute cor pulmonale (HCC) Submassive PE with RV to LV ratio of 1.28-1.54.  TTE normal without evidence of right heart strain.  She was in shock on arrival.  She was started on IV heparin, which was later discontinued due to [facility] and retroperitoneal bleed.  She has been off IV heparin since  6/5.   Started prophylactic dose of lovenox on 6/12, . She is on RA, and denies any sob and oxygen sats wnl on RA.    Hypovolemic shock (HCC) Likely from poor p.o. intake and dehydration.  However, she could be in shock from PE and sepsis in the setting of possible UTI/pyelonephritis.  She has been off vasopressors since 6/5.   She is hemodynamically stable.   ABLA likely due to retroperitoneal bleed Off anti coagulation.  Hemoglobin stable around 11.  -Continue monitoring    Brain mass with vasogenic edema and mass effect CT and MRI brain showed 5.2 x 5.3 cm large right basal ganglia mass with surrounding edema and  11 mm left midline shift and near complete effacement of right lateral ventricle.   Neurosurgery recommended outpatient follow-up in 2 to 3 months for reevaluation and possible surgical planning.   Continue Decadron 4 mg daily until patient sees neurosurgery. Palliative medicine is on board and following.  APS is involved. Unfortunately no family to help with the goals of care and medical decision at this time. No headaches overnight.  there is a court date set for July 7 at 10 AM, moving forward with guardianship.  Acute metabolic encephalopathy Appears to have resolved currently she is alert and oriented x4 and aware of her diagnosis but with limited insight.  Patient appears to have some cognitive deficits.  She had cognitive evaluation by SLP.  reorientation and delirium precautions while minimizing sedating medications. She is alert and oriented to place and person only.    Abnormal CT scan,  esophagus Distal esophageal thickening at GE junction suspicious for neoplasm or severe esophagitis. Recommend outpatient follow-up with gastroenterology for EGD. Pt denies any heart burn.   Esophageal dysphagia Likely in the setting of distal esophageal thickening. -Dysphagia 3 diet per SLP Continue with PPI  Lactic acidosis Resolved.  Elevated troponin Likely demand ischemia in the setting of right heart strain from PE.   Pt denies any chest pain or sob.   Hyponatremia Stable.   AKI (acute kidney injury) ([place]) Likely prerenal.  Resolved. Repeat creatinine is at baseline.   Bandemia Likely demargination from steroid.  Resolved.   Physical deconditioning Patient lives alone.  Reportedly estranged from family members.  She was found down on feces and urine before arrival.  It seems APS has been involved.  PCCM was able to Google search and talk to patient's brother.  She has a friend visiting.  Palliative medicine consulted for goals of care discussion.  Therapy recommends SNF. Currently waiting for guardian ship. there is a court date set for July 7 at 10 AM, moving forward with guardianship.   Controlled NIDDM-2 A1c 5.5%. Recent Labs  Lab 02/05/22 1119 02/05/22 1843 02/05/22 2312 02/06/22 0620 02/06/22 1248  GLUCAP 149* 132* 143* 140* 176*   Continue with SSI while inpatient, while on decadron.  No change in meds.     Acute respiratory failure with hypoxia (HCC) Resolved.  On room air.   Hypotension:  Pt asymptomatic. Resolved.  D/c lisinopril.    RN Pressure Injury Documentation: Pressure Injury 01/30/22 Sacrum Right;Medial Stage 2 -  Partial thickness loss of dermis presenting as a shallow open injury with a red, pink wound bed without slough. (Active)  01/30/22 2053  Location: Sacrum  Location Orientation: Right;Medial  Staging: Stage 2 -  Partial thickness loss of dermis presenting as a shallow open injury with a red, pink wound bed without slough.   Wound Description (Comments):   Present on Admission: Yes  Dressing Type Foam - Lift dressing to assess site every shift 02/19/22 0934     Pressure Injury 01/30/22 Coccyx Lower;Medial Stage 2 -  Partial thickness loss of dermis presenting as a shallow open injury with a red, pink wound bed without slough. (Active)  01/30/22 2053  Location: Coccyx  Location Orientation: Lower;Medial  Staging: Stage 2 -  Partial thickness loss of dermis presenting as a shallow open injury with a red, pink wound bed without slough.  Wound Description (Comments):   Present on Admission: Yes  Dressing Type Foam - Lift dressing to assess site every shift 02/19/22 0934   Continue with local wound care.  Estimated body mass index is 24.87 kg/m as calculated from the following:   Height as of this encounter: '5\' 5"'$  (1.651 m).   Weight as of this encounter: 67.8 kg.  Code Status: Full code DVT Prophylaxis:  enoxaparin (LOVENOX) injection 40 mg Start: 02/09/22 1800 Place and maintain sequential compression device Start: 02/02/22 1450   Level of Care: Level of care: Med-Surg Family Communication: None at bedside.   Disposition Plan:     Remains inpatient appropriate:  SNF.    Consultants:   Pulmonology admitted patient Neurosurgery-signed off Palliative medicine    Antimicrobials:   Anti-infectives (From admission, onward)    Start     Dose/Rate Route Frequency Ordered Stop   02/03/22 1715  levofloxacin (LEVAQUIN) tablet 500 mg  Status:  Discontinued        500 mg Oral Daily 02/03/22 1627 02/05/22 1355   02/02/22 2045  levofloxacin (LEVAQUIN) IVPB 500 mg  Status:  Discontinued       Note to Pharmacy: Adjust as needed - switching to IV since unable to take PO   500 mg 100 mL/hr over 60 Minutes Intravenous Every 24 hours 02/02/22 1959 02/03/22 1627   02/01/22 1400  levofloxacin (LEVAQUIN) tablet 500 mg  Status:  Discontinued        500 mg Oral Daily 02/01/22 0911 02/02/22 1959   01/31/22  1000  vancomycin (VANCOREADY) IVPB 750 mg/150 mL  Status:  Discontinued        750 mg 150 mL/hr over 60 Minutes Intravenous Every 12 hours 01/31/22 0003 01/31/22 0848   01/31/22 0200  vancomycin (VANCOREADY) IVPB 1500 mg/300 mL  Status:  Discontinued        1,500 mg 150 mL/hr over 120 Minutes Intravenous Every 12 hours 01/30/22 1241 01/30/22 2348   01/30/22 2100  aztreonam (AZACTAM) 2 g in sodium chloride 0.9 % 100 mL IVPB  Status:  Discontinued        2 g 200 mL/hr over 30 Minutes Intravenous Every 8 hours 01/30/22 1240 02/01/22 0923   01/30/22 1245  aztreonam (AZACTAM) 2 g in sodium chloride 0.9 % 100 mL IVPB        2 g 200 mL/hr over 30 Minutes Intravenous  Once 01/30/22 1231 01/30/22 1408   01/30/22 1245  metroNIDAZOLE (FLAGYL) IVPB 500 mg        500 mg 100 mL/hr over 60 Minutes Intravenous  Once 01/30/22 1231 01/30/22 1408   01/30/22 1245  vancomycin (VANCOCIN) IVPB 1000 mg/200 mL premix  Status:  Discontinued        1,000 mg 200 mL/hr over 60 Minutes Intravenous  Once 01/30/22 1231 01/30/22 1239   01/30/22 1245  vancomycin (VANCOREADY) IVPB 2000 mg/400 mL        2,000 mg 200 mL/hr over 120 Minutes Intravenous  Once 01/30/22 1239 01/30/22 1607        Medications  Scheduled Meds:  dexamethasone  4 mg Oral Daily   docusate sodium  100 mg Oral BID   enoxaparin (LOVENOX) injection  40 mg Subcutaneous Q24H   insulin aspart  0-9 Units Subcutaneous TID AC & HS   pantoprazole  40 mg Oral BID   polyethylene glycol  17 g Oral BID   senna  1 tablet Oral QHS   Continuous Infusions:  sodium chloride Stopped (02/02/22 0929)   PRN Meds:.acetaminophen, calcium carbonate, haloperidol lactate, iohexol, ondansetron (ZOFRAN) IV    Subjective:   Jennifer Pacheco was seen and examined today. No headaches.  Objective:  Vitals:   02/18/22 0813 02/18/22 1949 02/19/22 0800 02/19/22 1330  BP: 111/69 1'03/70 95/64 99/67 '$  Pulse: 71 77 72 77  Resp:  '19 16 16  '$ Temp: 98.8 F (37.1 C) 98.7  F (37.1 C) 98.3 F (36.8 C) 98.4 F (36.9 C)  TempSrc: Oral Oral Oral Oral  SpO2: 97% 97% 95% 96%  Weight:      Height:        Intake/Output Summary (Last 24 hours) at 02/19/2022 1347 Last data filed at 02/19/2022 0915 Gross per 24 hour  Intake 120 ml  Output 700 ml  Net -580 ml    Filed Weights   02/07/22 0325 02/12/22 0424 02/16/22 0500  Weight: 71.1 kg 68 kg 67.8 kg     Exam General exam: Appears calm and comfortable  Respiratory system: Clear to auscultation. Respiratory effort normal. Cardiovascular system: S1 & S2 heard, RRR. No JVD, No pedal edema. Gastrointestinal system: Abdomen is nondistended, soft and nontender. Normal bowel sounds heard. Central nervous system: Alert and oriented. No focal neurological deficits. Extremities: Symmetric 5 x 5 power. Skin: No rashes, lesions or ulcers Psychiatry: Mood & affect appropriate.        Data Reviewed:  I have personally reviewed following labs and imaging studies   CBC Lab Results  Component Value Date   WBC 9.6 02/17/2022   RBC 4.00 02/17/2022   HGB 11.4 (L) 02/17/2022   HCT 34.2 (L) 02/17/2022   MCV 85.5 02/17/2022   MCH 28.5 02/17/2022   PLT 334 02/17/2022   MCHC 33.3 02/17/2022   RDW 23.9 (H) 02/17/2022   LYMPHSABS 1.5 02/17/2022   MONOABS 0.6 02/17/2022   EOSABS 0.0 02/17/2022   BASOSABS 0.0 29/56/2130     Last metabolic panel Lab Results  Component Value Date   NA 132 (L) 02/13/2022   K 4.1 02/13/2022   CL 98 02/13/2022   CO2 24 02/13/2022   BUN 18 02/13/2022   CREATININE 0.69 02/16/2022   GLUCOSE 167 (H) 02/13/2022   GFRNONAA >60 02/16/2022   CALCIUM 8.9 02/13/2022   PHOS 3.5 02/13/2022   PROT 4.8 (L) 02/05/2022   ALBUMIN 2.8 (L) 02/13/2022   BILITOT 0.6 02/05/2022   ALKPHOS 30 (L) 02/05/2022   AST 19 02/05/2022   ALT 26 02/05/2022   ANIONGAP 10 02/13/2022    CBG (last 3)  Recent Labs    02/18/22 2008 02/19/22 0744 02/19/22 1118  GLUCAP 180* 151* 79        Coagulation Profile: No results for input(s): "INR", "PROTIME" in the last 168 hours.   Radiology Studies: No results found.     Hosie Poisson M.D. [facility] 02/19/2022, 1:47 PM  Available via Epic secure chat 7am-7pm After 7 pm, please refer to night coverage provider listed on amion.

## 2022-02-19 NOTE — Plan of Care (Signed)
  Problem: Coping: Goal: Ability to adjust to condition or change in health will improve Outcome: Progressing   Problem: Nutritional: Goal: Maintenance of adequate nutrition will improve Outcome: Progressing   Problem: Tissue Perfusion: Goal: Adequacy of tissue perfusion will improve Outcome: Progressing   Problem: Activity: Goal: Risk for activity intolerance will decrease Outcome: Progressing   Problem: Nutrition: Goal: Adequate nutrition will be maintained Outcome: Progressing

## 2022-02-20 ENCOUNTER — Inpatient Hospital Stay (HOSPITAL_COMMUNITY): Payer: Medicaid Other

## 2022-02-20 DIAGNOSIS — N179 Acute kidney failure, unspecified: Secondary | ICD-10-CM | POA: Diagnosis not present

## 2022-02-20 DIAGNOSIS — G9389 Other specified disorders of brain: Secondary | ICD-10-CM | POA: Diagnosis not present

## 2022-02-20 DIAGNOSIS — E119 Type 2 diabetes mellitus without complications: Secondary | ICD-10-CM | POA: Diagnosis not present

## 2022-02-20 DIAGNOSIS — I2609 Other pulmonary embolism with acute cor pulmonale: Secondary | ICD-10-CM | POA: Diagnosis not present

## 2022-02-20 LAB — GLUCOSE, CAPILLARY
Glucose-Capillary: 243 mg/dL — ABNORMAL HIGH (ref 70–99)
Glucose-Capillary: 98 mg/dL (ref 70–99)
Glucose-Capillary: 163 mg/dL — ABNORMAL HIGH (ref 70–99)
Glucose-Capillary: 172 mg/dL — ABNORMAL HIGH (ref 70–99)
Glucose-Capillary: 143 mg/dL — ABNORMAL HIGH (ref 70–99)

## 2022-02-20 NOTE — TOC Progression Note (Signed)
Transition of Care [facility]) - Progression Note    Patient Details  Name: Jennifer Pacheco MRN: [id number] Date of Birth: 01-30-1956  [facility]) CM/SW Contact  Janae Bridgeman, RN Phone Number: 02/20/2022, 12:30 PM  Clinical Narrative:    CM updated the patient's FL2 and faxed out in the hub to explore options for SNF placement.  The patient is scheduled for Interim Guardianship court date for March 06, 2022 and will need to have DSS sign her into the facility since the patient does not have capacity to make medical decisions.  The patient is medically stable for discharge but needs guardianship assigned and SNF placement before discharged from the hospital.  Attending physician, Dr. Blake Divine and hospital leadership are aware.   Expected Discharge Plan: (P) Skilled Nursing Facility Barriers to Discharge: (P) Continued Medical Work up, Unsafe home situation, Family Issues  Expected Discharge Plan and Services Expected Discharge Plan: (P) Skilled Nursing Facility In-house Referral: Clinical Social Work, Hospice / Palliative Care   Post Acute Care Choice: Skilled Nursing Facility Living arrangements for the past 2 months: Single Family Home                                       Social Determinants of Health (SDOH) Interventions    Readmission Risk Interventions    02/11/2022    2:16 PM  Readmission Risk Prevention Plan  Transportation Screening Complete  PCP or Specialist Appt within 5-7 Days Complete  Home Care Screening Complete  Medication Review (RN CM) Complete

## 2022-02-21 DIAGNOSIS — N179 Acute kidney failure, unspecified: Secondary | ICD-10-CM | POA: Diagnosis not present

## 2022-02-21 DIAGNOSIS — G9389 Other specified disorders of brain: Secondary | ICD-10-CM | POA: Diagnosis not present

## 2022-02-21 DIAGNOSIS — I2609 Other pulmonary embolism with acute cor pulmonale: Secondary | ICD-10-CM | POA: Diagnosis not present

## 2022-02-21 DIAGNOSIS — E119 Type 2 diabetes mellitus without complications: Secondary | ICD-10-CM | POA: Diagnosis not present

## 2022-02-21 LAB — GLUCOSE, CAPILLARY
Glucose-Capillary: 100 mg/dL — ABNORMAL HIGH (ref 70–99)
Glucose-Capillary: 225 mg/dL — ABNORMAL HIGH (ref 70–99)
Glucose-Capillary: 158 mg/dL — ABNORMAL HIGH (ref 70–99)
Glucose-Capillary: 174 mg/dL — ABNORMAL HIGH (ref 70–99)

## 2022-02-22 DIAGNOSIS — N179 Acute kidney failure, unspecified: Secondary | ICD-10-CM | POA: Diagnosis not present

## 2022-02-22 DIAGNOSIS — I2609 Other pulmonary embolism with acute cor pulmonale: Secondary | ICD-10-CM | POA: Diagnosis not present

## 2022-02-22 DIAGNOSIS — G9389 Other specified disorders of brain: Secondary | ICD-10-CM | POA: Diagnosis not present

## 2022-02-22 DIAGNOSIS — E119 Type 2 diabetes mellitus without complications: Secondary | ICD-10-CM | POA: Diagnosis not present

## 2022-02-22 LAB — GLUCOSE, CAPILLARY
Glucose-Capillary: 103 mg/dL — ABNORMAL HIGH (ref 70–99)
Glucose-Capillary: 165 mg/dL — ABNORMAL HIGH (ref 70–99)
Glucose-Capillary: 163 mg/dL — ABNORMAL HIGH (ref 70–99)
Glucose-Capillary: 119 mg/dL — ABNORMAL HIGH (ref 70–99)

## 2022-02-22 MED ORDER — GABAPENTIN 600 MG PO TABS
300.0000 mg | ORAL_TABLET | Freq: Two times a day (BID) | ORAL | Status: DC
  Administered 2022-02-22 – 2022-04-01 (×77): 300 mg via ORAL
  Filled 2022-02-22 (×78): qty 1

## 2022-02-23 DIAGNOSIS — D62 Acute posthemorrhagic anemia: Secondary | ICD-10-CM | POA: Diagnosis not present

## 2022-02-23 DIAGNOSIS — G9341 Metabolic encephalopathy: Secondary | ICD-10-CM | POA: Diagnosis not present

## 2022-02-23 DIAGNOSIS — I2609 Other pulmonary embolism with acute cor pulmonale: Secondary | ICD-10-CM | POA: Diagnosis not present

## 2022-02-23 DIAGNOSIS — J9601 Acute respiratory failure with hypoxia: Secondary | ICD-10-CM | POA: Diagnosis not present

## 2022-02-23 LAB — CBC WITH DIFFERENTIAL/PLATELET
Abs Immature Granulocytes: 0 10*3/uL (ref 0.00–0.07)
Basophils Absolute: 0 10*3/uL (ref 0.0–0.1)
Basophils Relative: 0 %
Eosinophils Absolute: 0 10*3/uL (ref 0.0–0.5)
Eosinophils Relative: 0 %
HCT: 38.1 % (ref 36.0–46.0)
Hemoglobin: 12.5 g/dL (ref 12.0–15.0)
Lymphocytes Relative: 30 %
Lymphs Abs: 2.3 10*3/uL (ref 0.7–4.0)
MCH: 29.2 pg (ref 26.0–34.0)
MCHC: 32.8 g/dL (ref 30.0–36.0)
MCV: 89 fL (ref 80.0–100.0)
Monocytes Absolute: 0.3 10*3/uL (ref 0.1–1.0)
Monocytes Relative: 4 %
Neutro Abs: 5 10*3/uL (ref 1.7–7.7)
Neutrophils Relative %: 66 %
Platelets: 412 10*3/uL — ABNORMAL HIGH (ref 150–400)
RBC: 4.28 MIL/uL (ref 3.87–5.11)
RDW: 22.6 % — ABNORMAL HIGH (ref 11.5–15.5)
WBC: 7.6 10*3/uL (ref 4.0–10.5)
nRBC: 0 % (ref 0.0–0.2)
nRBC: 0 /100 WBC

## 2022-02-23 LAB — BASIC METABOLIC PANEL
Anion gap: 9 (ref 5–15)
BUN: 14 mg/dL (ref 8–23)
CO2: 26 mmol/L (ref 22–32)
Calcium: 9 mg/dL (ref 8.9–10.3)
Chloride: 96 mmol/L — ABNORMAL LOW (ref 98–111)
Creatinine, Ser: 0.72 mg/dL (ref 0.44–1.00)
GFR, Estimated: >60 mL/min (ref >60)
Glucose, Bld: 159 mg/dL — ABNORMAL HIGH (ref 70–99)
Potassium: 3.4 mmol/L — ABNORMAL LOW (ref 3.5–5.1)
Sodium: 131 mmol/L — ABNORMAL LOW (ref 135–145)

## 2022-02-23 LAB — GLUCOSE, CAPILLARY
Glucose-Capillary: 175 mg/dL — ABNORMAL HIGH (ref 70–99)
Glucose-Capillary: 145 mg/dL — ABNORMAL HIGH (ref 70–99)
Glucose-Capillary: 150 mg/dL — ABNORMAL HIGH (ref 70–99)
Glucose-Capillary: 180 mg/dL — ABNORMAL HIGH (ref 70–99)

## 2022-02-23 NOTE — Progress Notes (Signed)
PROGRESS NOTE    Jennifer Pacheco  ZOX:[id number] DOB: 02/01/1956 DOA: 01/30/2022 PCP: Sharlene Dory, NP   Brief Narrative:  66 year old F with PMH of DM-2 and HTN presented to [facility] ED on 6/2 with AMS after she was found down altered in feces and urine by someone who went to check on her.  She reports difficulty getting up after fall on her left buttock.  Unclear how long she was down.  She was found to be hypoxic and hypotensive.  She was started on IV fluids, vasopressors, supplemental oxygen and broad-spectrum antibiotics.  CT chest showed submassive PE with RV/LV ratio 1.2-1.5.  She was started on heparin. CT abd and UA concerning for UTI w/ possible pyelonephritis. CXR unremarkable. 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 NSG consulted recommend MRI and decadron, and transfer to [facility].     Patient was in ICU since arrival at [facility].  She had ABLA likely due to retroperitoneal bleed.  Heparin was discontinued.  She had IVC filter placed.  She came off vasopressor on 6/5.  [facility] on 6/[facility].  She is currently stable on room air.  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  Neurosurgery recommends outpatient follow-up in 2 to 3 months for reevaluation and possible surgical planning. Therapy evaluation recommending SNF.  APS involved in the case.  Palliative care medicine following.  She is medically stable for discharge at this time. She has a court date set for July 7 at 10 AM, moving forward with guardianship. No new events overnight.    Assessment & Plan:  Principal Problem:   Acute pulmonary embolism with acute cor pulmonale (HCC) Active Problems:   Hypovolemic shock (HCC)   Brain mass with  vasogenic edema and mass effect   ABLA likely due to retroperitoneal bleed   Esophageal dysphagia   Abnormal CT scan, esophagus   Retroperitoneal hematoma   Acute metabolic encephalopathy   Acute respiratory failure with hypoxia (HCC)   Brain edema (HCC)   Pressure injury of skin   Distal esophageal thickening   Electrolyte abnormality   Controlled NIDDM-2   Cholelithiasis   Physical deconditioning   Bandemia   AKI (acute kidney injury) (HCC)   Hyponatremia   Elevated troponin   Lactic acidosis   Cognitive communication deficit    Acute pulmonary embolism with acute cor pulmonale (HCC) Submassive PE with RV to LV ratio of 1.28-1.54.  TTE normal without evidence of right heart strain.  She was in shock on arrival.  Complicated by acute blood loss anemia and retroperitoneal bleed therefore heparin drip turned off on 6/5.  IVC filter placed 6/5 by IR.  Currently only on prophylactic dose of Lovenox since 6/12.    Hypovolemic shock (HCC) Septic shock secondary to acute pyelonephritis His issues have been resolved and treated.  Medically stable now.   ABLA likely due to retroperitoneal bleed -Off anticoagulation.  Hemoglobin stable around 11.  No further signs of blood loss.  Hold off on any further blood work unless if it becomes necessary     Brain mass with vasogenic edema and mass effect CT and MRI brain showed 5.2 x 5.3 cm large right basal ganglia mass with surrounding edema and 11 mm left midline  shift and near complete effacement of right lateral ventricle.  Neurosurgery recommends patient follow-up in 2-3 months grams orally daily. Repeat CT head without contrast  showing Known large meningioma involving the right frontotemporal region with associated vasogenic edema and regional mass effect, grossly stable as compared to recent MRI. Similar right-to-left midline shift with stable ventricular size and morphology. No progressive hydrocephalus or ventricular trapping.    Acute  metabolic encephalopathy Has poor insight and some cognitive deficits   Abnormal CT scan, esophagus Distal esophageal thickening at GE junction suspicious for neoplasm or severe esophagitis. Will need outpatient endoscopy.  PPI twice daily   Esophageal dysphagia -Dysphagia 3 diet per speech eval.  PPI twice daily   Lactic acidosis Resolved.   Elevated troponin Likely demand ischemia in the setting of right heart strain from PE.   Pt denies any chest pain or sob.    Hyponatremia Stable.    AKI (acute kidney injury) (HCC) Resolved   Bandemia From steroids    Poor social situation - Palliative care and APS involved.  Family around to make decision.  Per documentation court date is set on 7/7     Controlled NIDDM-2 A1c 5.5%. Sliding scale and Accu-Chek while on Decadron    Acute respiratory failure with hypoxia (HCC) Resolved.       DVT prophylaxis: Subcu Lovenox Code Status: Full code Family Communication: None  Status is: Inpatient Remains inpatient appropriate because: Awaiting safe disposition   Nutritional status          Body mass index is 24.87 kg/m.  Pressure Injury 01/30/22 Sacrum Right;Medial Stage 2 -  Partial thickness loss of dermis presenting as a shallow open injury with a red, pink wound bed without slough. (Active)  01/30/22 2053  Location: Sacrum  Location Orientation: Right;Medial  Staging: Stage 2 -  Partial thickness loss of dermis presenting as a shallow open injury with a red, pink wound bed without slough.  Wound Description (Comments):   Present on Admission: Yes     Pressure Injury 01/30/22 Coccyx Lower;Medial Stage 2 -  Partial thickness loss of dermis presenting as a shallow open injury with a red, pink wound bed without slough. (Active)  01/30/22 2053  Location: Coccyx  Location Orientation: Lower;Medial  Staging: Stage 2 -  Partial thickness loss of dermis presenting as a shallow open injury with a red, pink wound bed  without slough.  Wound Description (Comments):   Present on Admission: Yes        Subjective: Sitting up in chair, no complaints.  Acute events overnight. Nurse present at bedside as well    Examination:  General exam: Appears calm and comfortable  Respiratory system: Clear to auscultation. Respiratory effort normal. Cardiovascular system: S1 & S2 heard, RRR. No JVD, murmurs, rubs, gallops or clicks. No pedal edema. Gastrointestinal system: Abdomen is nondistended, soft and nontender. No organomegaly or masses felt. Normal bowel sounds heard. Central nervous system: Alert and oriented. No focal neurological deficits. Extremities: Symmetric 5 x 5 power. Skin: No rashes, lesions or ulcers Psychiatry: Judgement and insight appears very poor    Objective: Vitals:   02/22/22 0904 02/22/22 2003 02/23/22 0734 02/23/22 0742  BP: 116/72 (!) 123/94 (!) 85/56 100/64  Pulse: 71 68 (!) 113 100  Resp: 16 16 19    Temp: 98.6 F (37 C) 98.2 F (36.8 C) 98 F (36.7 C)   TempSrc: Oral Oral    SpO2: 98% 97% 93%   Weight:      Height:  Intake/Output Summary (Last 24 hours) at 02/23/2022 0802 Last data filed at 02/23/2022 0740 Gross per 24 hour  Intake 240 ml  Output 1250 ml  Net -1010 ml   Filed Weights   02/07/22 0325 02/12/22 0424 02/16/22 0500  Weight: 71.1 kg 68 kg 67.8 kg     Data Reviewed:   CBC: Recent Labs  Lab 02/17/22 0440  WBC 9.6  NEUTROABS 7.3  HGB 11.4*  HCT 34.2*  MCV 85.5  PLT 334   Basic Metabolic Panel: No results for input(s): "NA", "K", "CL", "CO2", "GLUCOSE", "BUN", "CREATININE", "CALCIUM", "MG", "PHOS" in the last 168 hours. GFR: Estimated Creatinine Clearance: 63.1 mL/min (by C-G formula based on SCr of 0.69 mg/dL). Liver Function Tests: No results for input(s): "AST", "ALT", "ALKPHOS", "BILITOT", "PROT", "ALBUMIN" in the last 168 hours. No results for input(s): "LIPASE", "AMYLASE" in the last 168 hours. No results for input(s):  "AMMONIA" in the last 168 hours. Coagulation Profile: No results for input(s): "INR", "PROTIME" in the last 168 hours. Cardiac Enzymes: No results for input(s): "CKTOTAL", "CKMB", "CKMBINDEX", "TROPONINI" in the last 168 hours. BNP (last 3 results) No results for input(s): "PROBNP" in the last 8760 hours. HbA1C: No results for input(s): "HGBA1C" in the last 72 hours. CBG: Recent Labs  Lab 02/22/22 0716 02/22/22 1201 02/22/22 1618 02/22/22 2135 02/23/22 0731  GLUCAP 103* 165* 163* 119* 175*   Lipid Profile: No results for input(s): "CHOL", "HDL", "LDLCALC", "TRIG", "CHOLHDL", "LDLDIRECT" in the last 72 hours. Thyroid Function Tests: No results for input(s): "TSH", "T4TOTAL", "FREET4", "T3FREE", "THYROIDAB" in the last 72 hours. Anemia Panel: No results for input(s): "VITAMINB12", "FOLATE", "FERRITIN", "TIBC", "IRON", "RETICCTPCT" in the last 72 hours. Sepsis Labs: No results for input(s): "PROCALCITON", "LATICACIDVEN" in the last 168 hours.  No results found for this or any previous visit (from the past 240 hour(s)).       Radiology Studies: No results found.      Scheduled Meds:  dexamethasone  4 mg Oral Daily   docusate sodium  100 mg Oral BID   enoxaparin (LOVENOX) injection  40 mg Subcutaneous Q24H   gabapentin  300 mg Oral BID   insulin aspart  0-9 Units Subcutaneous TID AC & HS   pantoprazole  40 mg Oral BID   polyethylene glycol  17 g Oral BID   senna  1 tablet Oral QHS   Continuous Infusions:  sodium chloride Stopped (02/02/22 0929)     LOS: 24 days   Time spent= 35 mins    Chontel Warning Joline Maxcy, MD [facility]  If 7PM-7AM, please contact night-coverage  02/23/2022, 8:02 AM

## 2022-02-24 DIAGNOSIS — D62 Acute posthemorrhagic anemia: Secondary | ICD-10-CM | POA: Diagnosis not present

## 2022-02-24 DIAGNOSIS — G9341 Metabolic encephalopathy: Secondary | ICD-10-CM | POA: Diagnosis not present

## 2022-02-24 DIAGNOSIS — R933 Abnormal findings on diagnostic imaging of other parts of digestive tract: Secondary | ICD-10-CM | POA: Diagnosis not present

## 2022-02-24 DIAGNOSIS — I2609 Other pulmonary embolism with acute cor pulmonale: Secondary | ICD-10-CM | POA: Diagnosis not present

## 2022-02-24 LAB — GLUCOSE, CAPILLARY
Glucose-Capillary: >600 mg/dL (ref 70–99)
Glucose-Capillary: 211 mg/dL — ABNORMAL HIGH (ref 70–99)
Glucose-Capillary: 184 mg/dL — ABNORMAL HIGH (ref 70–99)

## 2022-02-24 MED ORDER — POTASSIUM CHLORIDE CRYS ER 20 MEQ PO TBCR
40.0000 meq | EXTENDED_RELEASE_TABLET | Freq: Once | ORAL | Status: AC
  Administered 2022-02-24: 40 meq via ORAL
  Filled 2022-02-24: qty 2

## 2022-02-24 NOTE — Progress Notes (Signed)
CSW spoke with Philis Nettle of [facility] [employer] to discuss patient.   Edwin Dada, MSW, LCSW Transitions of Care  Clinical Social Worker II [phone]

## 2022-02-25 DIAGNOSIS — L89152 Pressure ulcer of sacral region, stage 2: Secondary | ICD-10-CM

## 2022-02-25 DIAGNOSIS — D62 Acute posthemorrhagic anemia: Secondary | ICD-10-CM | POA: Diagnosis not present

## 2022-02-25 DIAGNOSIS — R933 Abnormal findings on diagnostic imaging of other parts of digestive tract: Secondary | ICD-10-CM | POA: Diagnosis not present

## 2022-02-25 DIAGNOSIS — G9341 Metabolic encephalopathy: Secondary | ICD-10-CM | POA: Diagnosis not present

## 2022-02-25 DIAGNOSIS — R579 Shock, unspecified: Secondary | ICD-10-CM | POA: Diagnosis not present

## 2022-02-25 LAB — GLUCOSE, CAPILLARY
Glucose-Capillary: 166 mg/dL — ABNORMAL HIGH (ref 70–99)
Glucose-Capillary: 263 mg/dL — ABNORMAL HIGH (ref 70–99)
Glucose-Capillary: 187 mg/dL — ABNORMAL HIGH (ref 70–99)
Glucose-Capillary: 210 mg/dL — ABNORMAL HIGH (ref 70–99)
Glucose-Capillary: 164 mg/dL — ABNORMAL HIGH (ref 70–99)
Glucose-Capillary: 147 mg/dL — ABNORMAL HIGH (ref 70–99)

## 2022-02-25 MED ORDER — POTASSIUM CHLORIDE CRYS ER 20 MEQ PO TBCR
40.0000 meq | EXTENDED_RELEASE_TABLET | Freq: Once | ORAL | Status: AC
  Administered 2022-02-25: 40 meq via ORAL
  Filled 2022-02-25: qty 2

## 2022-02-25 NOTE — Progress Notes (Signed)
CSW spoke with Corene Cornea at [place] who states the court hearing is 7/7 at 10am. CSW offered to participate in hearing if needed. Corene Cornea will communicate with CSW if needs arise.  Madilyn Fireman, MSW, LCSW Transitions of Care  Clinical Social Worker II [phone]

## 2022-02-25 NOTE — Progress Notes (Signed)
PROGRESS NOTE    Jennifer Pacheco  ACZ:[id number] DOB: 03/16/1956 DOA: 01/30/2022 PCP: [name], NP   Brief Narrative:  66 year old F with PMH of DM-2 and HTN presented to [facility] ED on 6/2 with AMS after she was found down altered in feces and urine by someone who went to check on her.  She reports difficulty getting up after fall on her left buttock.  Unclear how long she was down.  She was found to be hypoxic and hypotensive.  She was started on IV fluids, vasopressors, supplemental oxygen and broad-spectrum antibiotics.  CT chest showed submassive PE with RV/LV ratio 1.2-1.5.  She was started on heparin. CT abd and UA concerning for UTI w/ possible pyelonephritis. CXR unremarkable. 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 NSG consulted recommend MRI and decadron, and transfer to [facility].     Patient was in ICU since arrival at [facility].  She had ABLA likely due to retroperitoneal bleed.  Heparin was discontinued.  She had IVC filter placed.  She came off vasopressor on 6/5.  She was transferred to [facility] on 6/8.  She is currently stable on room air.  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  Neurosurgery recommends outpatient follow-up in 2 to 3 months for reevaluation and possible surgical planning. Therapy evaluation recommending SNF.  APS involved in the case.  Palliative care medicine following.  She is medically stable for discharge at this time. She has a court date set for July 7 at 10 AM, moving forward with guardianship. No new events overnight.  Currently pending placement and safe dispo.    Assessment & Plan:  Principal Problem:   Acute pulmonary embolism with acute cor pulmonale (HCC) Active Problems:    Hypovolemic shock (HCC)   Brain mass with vasogenic edema and mass effect   ABLA likely due to retroperitoneal bleed   Esophageal dysphagia   Abnormal CT scan, esophagus   Retroperitoneal hematoma   Acute metabolic encephalopathy   Acute respiratory failure with hypoxia (HCC)   Brain edema (HCC)   Pressure injury of skin   Distal esophageal thickening   Electrolyte abnormality   Controlled NIDDM-2   Cholelithiasis   Physical deconditioning   Bandemia   AKI (acute kidney injury) (HCC)   Hyponatremia   Elevated troponin   Lactic acidosis   Cognitive communication deficit    Acute pulmonary embolism with acute cor pulmonale (HCC) --Submassive PE with RV to LV ratio of 1.28-1.54.  TTE normal without evidence of right heart strain.   --She was in shock on arrival;/treatment complicated by acute blood loss anemia and retroperitoneal bleed therefore heparin drip turned off on 6/5.  IVC filter placed 6/5 by IR. --On Lovenox ppx.    Hypovolemic shock ([place]) Septic shock secondary to acute pyelonephritis --His issues have been resolved and treated.  Medically stable now. --Continue to follow hemoglobin trend.   ABLA likely due to retroperitoneal bleed -Off anticoagulation.  Hemoglobin stable around 11.  No further signs of blood loss.  Hold off on any further blood work unless if it becomes necessary. -- Avoid the use of NSAIDs.     Brain mass with vasogenic edema and mass effect -CT and MRI brain showed 5.2 x 5.3 cm large right  basal ganglia mass with surrounding edema and 11 mm left midline shift and near complete effacement of right lateral ventricle.   -Neurosurgery recommends patient follow-up in 2-3 months grams orally daily. On Decadron '4mg'$  po daily until seen by outptn NeuroSx.    Acute metabolic encephalopathy -Has poor insight and some cognitive deficits   Abnormal CT scan, esophagus -Distal esophageal thickening at GE junction suspicious for neoplasm or severe  esophagitis. -Will need outpatient endoscopy.  PPI twice daily   Esophageal dysphagia -Dysphagia 3 diet per speech eval.  PPI twice daily   Lactic acidosis -Resolved.   Elevated troponin -Likely demand ischemia in the setting of right heart strain from PE.   -Pt denies any chest pain or sob.    Hyponatremia/hypokalemia --repleted --follow electrolytes and further replete as needed    AKI (acute kidney injury) ([place]) -Resolved.  --continue To follow renal function trend.   Bandemia -From steroids   Poor social situation - Palliative care and APS involved.  Family around to make decision.  Per documentation court date is set on 7/7) to pursued guardianship. --Case manager/social worker aware to help with placement.     Controlled NIDDM-2 -A1c 5.5%. -Continue sliding scale and Accu-Chek while on Decadron    Acute respiratory failure with hypoxia (HCC) -Resolved.   -Patient not requiring oxygen supplementation; good saturation on room air.     DVT prophylaxis: Subcu Lovenox Code Status: Full code Family Communication: None  Status is: Inpatient Remains inpatient appropriate because: Awaiting safe disposition   Nutritional status   Body mass index is 24.87 kg/m.  Pressure Injury 01/30/22 Sacrum Right;Medial Stage 2 -  Partial thickness loss of dermis presenting as a shallow open injury with a red, pink wound bed without slough. (Active)  01/30/22 2053  Location: Sacrum  Location Orientation: Right;Medial  Staging: Stage 2 -  Partial thickness loss of dermis presenting as a shallow open injury with a red, pink wound bed without slough.  Wound Description (Comments):   Present on Admission: Yes     Pressure Injury 01/30/22 Coccyx Lower;Medial Stage 2 -  Partial thickness loss of dermis presenting as a shallow open injury with a red, pink wound bed without slough. (Active)  01/30/22 2053  Location: Coccyx  Location Orientation: Lower;Medial  Staging: Stage 2 -   Partial thickness loss of dermis presenting as a shallow open injury with a red, pink wound bed without slough.  Wound Description (Comments):   Present on Admission: Yes   Subjective: Afebrile, no chest pain, no nausea or vomiting.  Patient is complaining of headache; no overnight events.  Examination: General exam: Complaining of headache; no nausea, no vomiting, no shortness of breath.  Patient reports no chest pain.  Afebrile. Respiratory system: Good air movement bilaterally; no using accessory muscle.  Good saturation on room air. Cardiovascular system:RRR.  No rubs, no gallops, no JVD. Gastrointestinal system: Abdomen is nondistended, soft and nontender. No organomegaly or masses felt. Normal bowel sounds heard. Central nervous system: No focal neurological deficits. Extremities: No cyanosis or clubbing. Skin: No petechiae.  Stage II pressure injury in her coccyx; no superimposed infection. Psychiatry: Judgement and insight appear impaired due to underlying psychiatric condition.  Objective: Vitals:   02/24/22 0748 02/24/22 0750 02/24/22 1942 02/25/22 0748  BP: 93/82 103/67 92/64 111/70  Pulse: 66 65 84 72  Resp:   18 16  Temp: (!) 97.5 F (36.4 C) 98 F (36.7 C) 97.9 F (36.6 C) 98.1 F (36.7 C)  TempSrc:  Oral Oral  Oral  SpO2: 96% 98% 96% 97%  Weight:      Height:        Intake/Output Summary (Last 24 hours) at 02/25/2022 1820 Last data filed at 02/25/2022 1019 Gross per 24 hour  Intake --  Output 1600 ml  Net -1600 ml   Filed Weights   02/07/22 0325 02/12/22 0424 02/16/22 0500  Weight: 71.1 kg 68 kg 67.8 kg     Data Reviewed:   CBC: Recent Labs  Lab 02/23/22 0841  WBC 7.6  NEUTROABS 5.0  HGB 12.5  HCT 38.1  MCV 89.0  PLT 224*   Basic Metabolic Panel: Recent Labs  Lab 02/23/22 0841  NA 131*  K 3.4*  CL 96*  CO2 26  GLUCOSE 159*  BUN 14  CREATININE 0.72  CALCIUM 9.0   GFR: Estimated Creatinine Clearance: 63.1 mL/min (by C-G formula based  on SCr of 0.72 mg/dL).  CBG: Recent Labs  Lab 02/24/22 1635 02/24/22 2101 02/25/22 0746 02/25/22 1226 02/25/22 1612  GLUCAP 263* 184* 187* 210* 164*    Radiology Studies: No results found.   Scheduled Meds:  dexamethasone  4 mg Oral Daily   docusate sodium  100 mg Oral BID   enoxaparin (LOVENOX) injection  40 mg Subcutaneous Q24H   gabapentin  300 mg Oral BID   insulin aspart  0-9 Units Subcutaneous TID AC & HS   pantoprazole  40 mg Oral BID   polyethylene glycol  17 g Oral BID   senna  1 tablet Oral QHS   Continuous Infusions:  sodium chloride Stopped (02/02/22 0929)     LOS: 26 days    Barton Dubois, MD [facility]  If 7PM-7AM, please contact night-coverage  02/25/2022, 6:20 PM

## 2022-02-25 NOTE — Progress Notes (Signed)
Physical Therapy Treatment Patient Details Name: Jennifer Pacheco MRN: [id number] DOB: 1955/12/07 Today's Date: 02/25/2022   History of Present Illness Pt is 66 yo female who was found down in her home and taken to [facility]. 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 Pt with retroperitoneal bleed and IVC filter placed 6/5. PMH: DM2, HTN.    PT Comments     Pt supine in bed on arrival.  She is lying is urine and starts of BM.  Required max encouragement to move OOB to commode where she completed BM.  PTA assisted in pericare and moving from bedside commode to recliner. She continues to self limit progress due to poor cognition.  Will continue to follow during acute stay.     Recommendations for follow up therapy are one component of a multi-disciplinary discharge planning process, led by the attending physician.  Recommendations may be updated based on patient status, additional functional criteria and insurance authorization.  Follow Up Recommendations  Skilled nursing-short term rehab (<3 hours/day)     Assistance Recommended at Discharge Frequent or constant Supervision/Assistance  Patient can return home with the following A little help with bathing/dressing/bathroom;Assistance with cooking/housework;Direct supervision/assist for medications management;Direct supervision/assist for financial management;Assist for transportation;Help with stairs or ramp for entrance;Two people to help with walking and/or transfers   Equipment Recommendations  Rolling walker (2 wheels);BSC/3in1    Recommendations for Other Services       Precautions / Restrictions Precautions Precautions: Fall Precaution Comments: HOH Restrictions Weight Bearing Restrictions: No     Mobility  Bed Mobility Overal bed mobility: Needs Assistance Bed Mobility: Supine to Sit     Supine to sit: Mod assist     General bed mobility comments: Heavy mod assistance to advance  LEs to edge of bed and elevate her trunk into a seated position.  Presents with lateral lean to the L.    Transfers Overall transfer level: Needs assistance Equipment used: Rolling walker (2 wheels) Transfers: Sit to/from Stand Sit to Stand: Min assist           General transfer comment: Cues for hand placement to push from seated surface into standing.  Pt sits impulsively without warning despite her ability to remain standing.    Ambulation/Gait Ambulation/Gait assistance: Min assist, +2 safety/equipment (requires close chair follow to progress gt distance further due to impuslive sitting.) Gait Distance (Feet): 4 Feet (x2) Assistive device: Rolling walker (2 wheels) Gait Pattern/deviations: Shuffle, Trunk flexed, Step-to pattern, WFL(Within Functional Limits) Gait velocity: decreased     General Gait Details: small low steps using RW too far advanced, assist to manage RW especially with turns  poor ability to progress distance due to safety.   Stairs             Wheelchair Mobility    Modified Rankin (Stroke Patients Only)       Balance Overall balance assessment: Needs assistance Sitting-balance support: Feet supported, No upper extremity supported Sitting balance-Leahy Scale: Fair Sitting balance - Comments: supervision for sitting balance Postural control: Left lateral lean Standing balance support: Bilateral upper extremity supported, Reliant on assistive device for balance Standing balance-Leahy Scale: Poor                              Cognition Arousal/Alertness: Awake/alert Behavior During Therapy: Flat affect, Impulsive (argumenative, distracted) Overall Cognitive Status: No family/caregiver present to determine baseline cognitive functioning Area  of Impairment: Attention, Following commands, Safety/judgement, Awareness, Problem solving                 Orientation Level: Disoriented to, Time, Situation Current Attention Level:  Sustained Memory: Decreased recall of precautions, Decreased short-term memory Following Commands: Follows one step commands inconsistently, Follows one step commands with increased time Safety/Judgement: Decreased awareness of safety, Decreased awareness of deficits Awareness: Intellectual Problem Solving: Difficulty sequencing, Requires verbal cues, Requires tactile cues, Slow processing, Decreased initiation General Comments: Perseverated on getting a [place] panthers bag out of her closet, a cup of milk and her squirt soda with ice.        Exercises      General Comments        Pertinent Vitals/Pain Pain Assessment Pain Assessment: 0-10 Pain Score: 8  Faces Pain Scale: Hurts a little bit Pain Location: rectum Pain Descriptors / Indicators: Discomfort, Grimacing Pain Intervention(s): Monitored during session, Repositioned    Home Living                          Prior Function            PT Goals (current goals can now be found in the care plan section) Acute Rehab PT Goals Patient Stated Goal: to get her caroline panthers bag Potential to Achieve Goals: Fair Progress towards PT goals: Progressing toward goals    Frequency    Min 3X/week      PT Plan Current plan remains appropriate    Co-evaluation              AM-PAC PT "6 Clicks" Mobility   Outcome Measure  Help needed turning from your back to your side while in a flat bed without using bedrails?: A Little Help needed moving from lying on your back to sitting on the side of a flat bed without using bedrails?: A Lot Help needed moving to and from a bed to a chair (including a wheelchair)?: A Lot Help needed standing up from a chair using your arms (e.g., wheelchair or bedside chair)?: A Lot Help needed to walk in hospital room?: Total Help needed climbing 3-5 steps with a railing? : Total 6 Click Score: 11    End of Session Equipment Utilized During Treatment: Gait belt Activity  Tolerance: Patient tolerated treatment well;Other (comment) Patient left: with call bell/phone within reach;Other (comment);in chair;with chair alarm set Nurse Communication: Mobility status PT Visit Diagnosis: Unsteadiness on feet (R26.81);Difficulty in walking, not elsewhere classified (R26.2)     Time: 0141-0301 PT Time Calculation (min) (ACUTE ONLY): 28 min  Charges:  $Therapeutic Activity: 23-37 mins                     Erasmo Leventhal , PTA Acute Rehabilitation Services Office [phone]    Jennifer Pacheco 02/25/2022, 4:14 PM

## 2022-02-25 NOTE — Plan of Care (Signed)

## 2022-02-26 DIAGNOSIS — D62 Acute posthemorrhagic anemia: Secondary | ICD-10-CM | POA: Diagnosis not present

## 2022-02-26 DIAGNOSIS — R933 Abnormal findings on diagnostic imaging of other parts of digestive tract: Secondary | ICD-10-CM | POA: Diagnosis not present

## 2022-02-26 DIAGNOSIS — G9341 Metabolic encephalopathy: Secondary | ICD-10-CM | POA: Diagnosis not present

## 2022-02-26 DIAGNOSIS — R579 Shock, unspecified: Secondary | ICD-10-CM | POA: Diagnosis not present

## 2022-02-26 LAB — CBC
HCT: 36.1 % (ref 36.0–46.0)
Hemoglobin: 11.9 g/dL — ABNORMAL LOW (ref 12.0–15.0)
MCH: 29.4 pg (ref 26.0–34.0)
MCHC: 33 g/dL (ref 30.0–36.0)
MCV: 89.1 fL (ref 80.0–100.0)
Platelets: 258 10*3/uL (ref 150–400)
RBC: 4.05 MIL/uL (ref 3.87–5.11)
RDW: 21.6 % — ABNORMAL HIGH (ref 11.5–15.5)
WBC: 7.7 10*3/uL (ref 4.0–10.5)
nRBC: 0 % (ref 0.0–0.2)

## 2022-02-26 LAB — GLUCOSE, CAPILLARY
Glucose-Capillary: 82 mg/dL (ref 70–99)
Glucose-Capillary: 160 mg/dL — ABNORMAL HIGH (ref 70–99)
Glucose-Capillary: 199 mg/dL — ABNORMAL HIGH (ref 70–99)
Glucose-Capillary: 133 mg/dL — ABNORMAL HIGH (ref 70–99)

## 2022-02-26 LAB — BASIC METABOLIC PANEL
Anion gap: 11 (ref 5–15)
BUN: 14 mg/dL (ref 8–23)
CO2: 26 mmol/L (ref 22–32)
Calcium: 9 mg/dL (ref 8.9–10.3)
Chloride: 100 mmol/L (ref 98–111)
Creatinine, Ser: 0.71 mg/dL (ref 0.44–1.00)
GFR, Estimated: >60 mL/min (ref >60)
Glucose, Bld: 89 mg/dL (ref 70–99)
Potassium: 4.3 mmol/L (ref 3.5–5.1)
Sodium: 137 mmol/L (ref 135–145)

## 2022-02-26 NOTE — Progress Notes (Signed)
PROGRESS NOTE    Jennifer Pacheco  YVO:[id number] DOB: May 14, 1956 DOA: 01/30/2022 PCP: [name], NP   Brief Narrative:  66 year old F with PMH of DM-2 and HTN presented to [facility] ED on 6/2 with AMS after she was found down altered in feces and urine by someone who went to check on her.  She reports difficulty getting up after fall on her left buttock.  Unclear how long she was down.  She was found to be hypoxic and hypotensive.  She was started on IV fluids, vasopressors, supplemental oxygen and broad-spectrum antibiotics.  CT chest showed submassive PE with RV/LV ratio 1.2-1.5.  She was started on heparin. CT abd and UA concerning for UTI w/ possible pyelonephritis. CXR unremarkable. 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 NSG consulted recommend MRI and decadron, and transfer to [facility].     Patient was in ICU since arrival at [facility].  She had ABLA likely due to retroperitoneal bleed.  Heparin was discontinued.  She had IVC filter placed.  She came off vasopressor on 6/5.  She was transferred to [facility] on 6/8.  She is currently stable on room air.  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  Neurosurgery recommends outpatient follow-up in 2 to 3 months for reevaluation and possible surgical planning. Therapy evaluation recommending SNF.  APS involved in the case.  Palliative care medicine following.  She is medically stable for discharge at this time. She has a court date set for July 7 at 10 AM, moving forward with guardianship. No new events overnight.  Currently pending placement and safe dispo.    Assessment & Plan:  Principal Problem:   Acute pulmonary embolism with acute cor pulmonale (HCC) Active Problems:    Hypovolemic shock (HCC)   Brain mass with vasogenic edema and mass effect   ABLA likely due to retroperitoneal bleed   Esophageal dysphagia   Abnormal CT scan, esophagus   Retroperitoneal hematoma   Acute metabolic encephalopathy   Acute respiratory failure with hypoxia (HCC)   Brain edema (HCC)   Pressure injury of skin   Distal esophageal thickening   Electrolyte abnormality   Controlled NIDDM-2   Cholelithiasis   Physical deconditioning   Bandemia   AKI (acute kidney injury) (HCC)   Hyponatremia   Elevated troponin   Lactic acidosis   Cognitive communication deficit    Acute pulmonary embolism with acute cor pulmonale (HCC) --Submassive PE with RV to LV ratio of 1.28-1.54.  TTE normal without evidence of right heart strain.   --She was in shock on arrival;/treatment complicated by acute blood loss anemia and retroperitoneal bleed therefore heparin drip turned off on 6/5.  IVC filter placed 6/5 by IR. --On Lovenox ppx.    Hypovolemic shock ([place]) Septic shock secondary to acute pyelonephritis --His issues have been resolved and treated.  Medically stable now. --Continue to follow hemoglobin trend.   ABLA likely due to retroperitoneal bleed -Off anticoagulation.  Hemoglobin stable around 11.  No further signs of blood loss.  Hold off on any further blood work unless if it becomes necessary. -- Avoid the use of NSAIDs.    Brain mass with vasogenic edema and mass effect -CT and MRI brain showed 5.2 x 5.3 cm large right basal  ganglia mass with surrounding edema and 11 mm left midline shift and near complete effacement of right lateral ventricle.   -Neurosurgery recommends patient follow-up in 2-3 months grams orally daily. On Decadron '4mg'$  po daily until seen by outptn NeuroSx.    Acute metabolic encephalopathy -Has poor insight and some cognitive deficits   Abnormal CT scan, esophagus -Distal esophageal thickening at GE junction suspicious for neoplasm or severe  esophagitis. -Will need outpatient endoscopy.  PPI twice daily   Esophageal dysphagia -Dysphagia 3 diet per speech eval.  PPI twice daily   Lactic acidosis -Resolved.   Elevated troponin -Likely demand ischemia in the setting of right heart strain from PE.   -Pt denies any chest pain or sob.    Hyponatremia/hypokalemia --repleted --follow electrolytes and further replete as needed    AKI (acute kidney injury) ([place]) -Resolved.  --continue To follow renal function trend.   Bandemia -From steroids   Poor social situation - Palliative care and APS involved.  Family around to make decision.  Per documentation court date is set on 7/7) to pursued guardianship. --Case manager/social worker aware to help with placement.     Controlled NIDDM-2 -A1c 5.5%. -Continue sliding scale and Accu-Chek while on Decadron    Acute respiratory failure with hypoxia (HCC) -Resolved.   -Patient not requiring oxygen supplementation; good saturation on room air.     DVT prophylaxis: Subcu Lovenox Code Status: Full code Family Communication: None  Status is: Inpatient Remains inpatient appropriate because: Awaiting safe disposition.  Currently waiting for court date to pursued guardianship assignment on March 06, 2022.   Nutritional status   Body mass index is 24.87 kg/m.  Pressure Injury 01/30/22 Sacrum Right;Medial Stage 2 -  Partial thickness loss of dermis presenting as a shallow open injury with a red, pink wound bed without slough. (Active)  01/30/22 2053  Location: Sacrum  Location Orientation: Right;Medial  Staging: Stage 2 -  Partial thickness loss of dermis presenting as a shallow open injury with a red, pink wound bed without slough.  Wound Description (Comments):   Present on Admission: Yes     Pressure Injury 01/30/22 Coccyx Lower;Medial Stage 2 -  Partial thickness loss of dermis presenting as a shallow open injury with a red, pink wound bed without slough. (Active)   01/30/22 2053  Location: Coccyx  Location Orientation: Lower;Medial  Staging: Stage 2 -  Partial thickness loss of dermis presenting as a shallow open injury with a red, pink wound bed without slough.  Wound Description (Comments):   Present on Admission: Yes   Subjective: No fever, no chest pain, no nausea, no vomiting.  Reports no headaches today.  Examination: General exam: No chest pain, no nausea, no vomiting.  Reports no headache today.  Patient tolerating diet and is currently afebrile. Respiratory system: Clear to auscultation. Respiratory effort normal.  No using accessory muscles.  Good saturation on room air. Cardiovascular system:RRR. No murmurs, rubs, gallops.  No JVD. Gastrointestinal system: Abdomen is nondistended, soft and nontender. No organomegaly or masses felt. Normal bowel sounds heard. Central nervous system: No focal neurological deficits. Extremities: No cyanosis, no clubbing. Skin: No petechiae.  Stage II pressure injury in the middle of her coccyx without signs of superimposed infection appreciated. Psychiatry: Judgement and insight appear impaired secondary to underlying psychiatric condition.  Stable mood currently.  Objective: Vitals:   02/24/22 1942 02/25/22 0748 02/25/22 1938 02/26/22 0746  BP: 92/64 111/70 106/67 129/87  Pulse: 84 72 80 (!) 55  Resp:  $'18 16  16  'K$ Temp: 97.9 F (36.6 C) 98.1 F (36.7 C) 98.3 F (36.8 C) 98.3 F (36.8 C)  TempSrc:  Oral Oral Oral  SpO2: 96% 97% 100% 100%  Weight:      Height:        Intake/Output Summary (Last 24 hours) at 02/26/2022 1810 Last data filed at 02/26/2022 0500 Gross per 24 hour  Intake --  Output 600 ml  Net -600 ml   Filed Weights   02/07/22 0325 02/12/22 0424 02/16/22 0500  Weight: 71.1 kg 68 kg 67.8 kg     Data Reviewed:   CBC: Recent Labs  Lab 02/23/22 0841 02/26/22 0623  WBC 7.6 7.7  NEUTROABS 5.0  --   HGB 12.5 11.9*  HCT 38.1 36.1  MCV 89.0 89.1  PLT 412* 329   Basic  Metabolic Panel: Recent Labs  Lab 02/23/22 0841 02/26/22 0623  NA 131* 137  K 3.4* 4.3  CL 96* 100  CO2 26 26  GLUCOSE 159* 89  BUN 14 14  CREATININE 0.72 0.71  CALCIUM 9.0 9.0   GFR: Estimated Creatinine Clearance: 63.1 mL/min (by C-G formula based on SCr of 0.71 mg/dL).  CBG: Recent Labs  Lab 02/25/22 1612 02/25/22 2222 02/26/22 0752 02/26/22 1212 02/26/22 1637  GLUCAP 164* 147* 82 160* 199*    Radiology Studies: No results found.   Scheduled Meds:  dexamethasone  4 mg Oral Daily   docusate sodium  100 mg Oral BID   enoxaparin (LOVENOX) injection  40 mg Subcutaneous Q24H   gabapentin  300 mg Oral BID   insulin aspart  0-9 Units Subcutaneous TID AC & HS   pantoprazole  40 mg Oral BID   polyethylene glycol  17 g Oral BID   senna  1 tablet Oral QHS   Continuous Infusions:  sodium chloride Stopped (02/02/22 0929)     LOS: 27 days    Barton Dubois, MD [facility]  If 7PM-7AM, please contact night-coverage  02/26/2022, 6:10 PM

## 2022-02-26 NOTE — Progress Notes (Signed)
Occupational Therapy Treatment Patient Details Name: Jennifer Pacheco MRN: [id number] DOB: 23-Nov-1955 Today's Date: 02/26/2022   History of present illness Pt is 66 yo female who was found down in her home and taken to [facility]. 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 Pt with retroperitoneal bleed and IVC filter placed 6/5. PMH: DM2, HTN.   OT comments  Patient required frequent cues for encouragement and to stay on task. Patient often argumentative when given directions. Patient required increased time to get to EOB and mod assist due to assistance needed with BLEs. Patient stood with min assist and was mod assist to transfer to recliner due to poor walker use and patient began sitting early. Patient stated she needed to use [facility] after getting to recliner and continued to need mod assist for transfer. Patient stood for toilet hygiene and required mod assist to complete. Patient performed grooming and dressign from recliner with min assist for UB and mod assist for LB dressing and increased time due to patient refusing assistance and then asking for help when unable to complete. Patient to continue to be followed by acute OT.    Recommendations for follow up therapy are one component of a multi-disciplinary discharge planning process, led by the attending physician.  Recommendations may be updated based on patient status, additional functional criteria and insurance authorization.    Follow Up Recommendations  Skilled nursing-short term rehab (<3 hours/day)    Assistance Recommended at Discharge Frequent or constant Supervision/Assistance  Patient can return home with the following  A lot of help with walking and/or transfers;A lot of help with bathing/dressing/bathroom;Assistance with cooking/housework;Direct supervision/assist for medications management;Direct supervision/assist for financial management;Assist for transportation;Help with stairs or ramp  for entrance   Equipment Recommendations  Other (comment) (TBD)    Recommendations for Other Services      Precautions / Restrictions Precautions Precautions: Fall Precaution Comments: HOH Restrictions Weight Bearing Restrictions: No       Mobility Bed Mobility Overal bed mobility: Needs Assistance Bed Mobility: Supine to Sit     Supine to sit: Mod assist     General bed mobility comments: patient asked for no help initially but was unable to complete without mod assist for LEs and trunk    Transfers Overall transfer level: Needs assistance Equipment used: Rolling walker (2 wheels) Transfers: Sit to/from Stand, Bed to chair/wheelchair/BSC Sit to Stand: Min assist     Step pivot transfers: Mod assist     General transfer comment: mod assist due to patient attempting to sit early in chair and poor use of RW     Balance Overall balance assessment: Needs assistance Sitting-balance support: Feet supported, No upper extremity supported Sitting balance-Leahy Scale: Fair Sitting balance - Comments: supervision for sitting balance Postural control: Left lateral lean Standing balance support: Bilateral upper extremity supported, Reliant on assistive device for balance Standing balance-Leahy Scale: Poor Standing balance comment: demonstrated left lateral leaning while standing                           ADL either performed or assessed with clinical judgement   ADL Overall ADL's : Needs assistance/impaired     Grooming: Wash/dry hands;Wash/dry face;Brushing hair;Supervision/safety;Sitting Grooming Details (indicate cue type and reason): in recliner         Upper Body Dressing : Minimal assistance;Sitting Upper Body Dressing Details (indicate cue type and reason): to donn pullover top.  Required  min assist due to patient putting arms in wrong sleeve Lower Body Dressing: Moderate assistance;Sit to/from stand Lower Body Dressing Details (indicate cue type  and reason): donned scrub bottoms Toilet Transfer: Rolling walker (2 wheels);BSC/3in1;Stand-pivot;Moderate assistance Toilet Transfer Details (indicate cue type and reason): transferred to [facility] with mod assist due to patient began sitting early Toileting- Clothing Manipulation and Hygiene: Moderate assistance;Sit to/from stand Toileting - Clothing Manipulation Details (indicate cue type and reason): to clean following a BM       General ADL Comments: Patient required increased time to perform self care tasks due to argumentative and required cues to stay on task    Extremity/Trunk Assessment              Vision       Perception     Praxis      Cognition Arousal/Alertness: Awake/alert Behavior During Therapy: Flat affect, Impulsive (argumentative) Overall Cognitive Status: No family/caregiver present to determine baseline cognitive functioning Area of Impairment: Attention, Following commands, Safety/judgement, Awareness, Problem solving                 Orientation Level: Disoriented to, Time, Situation Current Attention Level: Sustained Memory: Decreased recall of precautions, Decreased short-term memory Following Commands: Follows one step commands inconsistently, Follows one step commands with increased time Safety/Judgement: Decreased awareness of safety, Decreased awareness of deficits Awareness: Intellectual Problem Solving: Difficulty sequencing, Requires verbal cues, Requires tactile cues, Slow processing, Decreased initiation General Comments: asking therapist to walk with her to [employer]        Exercises      Shoulder Instructions       General Comments      Pertinent Vitals/ Pain       Pain Assessment Pain Assessment: Faces Faces Pain Scale: Hurts a little bit Pain Location: rectum Pain Descriptors / Indicators: Discomfort, Grimacing Pain Intervention(s): Monitored during session, Repositioned  Home Living                                           Prior Functioning/Environment              Frequency  Min 2X/week        Progress Toward Goals  OT Goals(current goals can now be found in the care plan section)  Progress towards OT goals: Progressing toward goals  Acute Rehab OT Goals Patient Stated Goal: get better OT Goal Formulation: With patient Time For Goal Achievement: 03/04/22 Potential to Achieve Goals: Good ADL Goals Pt Will Perform Grooming: Independently;standing Pt Will Perform Upper Body Bathing: Independently;sitting;standing Pt Will Perform Lower Body Bathing: Independently;sit to/from stand Pt Will Perform Upper Body Dressing: Independently;sitting;standing Pt Will Perform Lower Body Dressing: Independently;sit to/from stand Pt Will Transfer to Toilet: Independently;ambulating;bedside commode Pt Will Perform Toileting - Clothing Manipulation and hygiene: Independently;sit to/from stand Additional ADL Goal #1: Pt will be independent in and out of bed  Plan Discharge plan remains appropriate    Co-evaluation                 AM-PAC OT "6 Clicks" Daily Activity     Outcome Measure   Help from another person eating meals?: A Little Help from another person taking care of personal grooming?: A Little Help from another person toileting, which includes using toliet, bedpan, or urinal?: A Lot Help from another person bathing (including washing, rinsing, drying)?: A Lot Help  from another person to put on and taking off regular upper body clothing?: A Little Help from another person to put on and taking off regular lower body clothing?: A Lot 6 Click Score: 15    End of Session Equipment Utilized During Treatment: Rolling walker (2 wheels)  OT Visit Diagnosis: Unsteadiness on feet (R26.81);Other abnormalities of gait and mobility (R26.89);History of falling (Z91.81);Other symptoms and signs involving cognitive function   Activity Tolerance Patient tolerated treatment  well   Patient Left in chair;with call bell/phone within reach;with chair alarm set   Nurse Communication Mobility status        Time: 9483-4758 OT Time Calculation (min): 53 min  Charges: OT General Charges $OT Visit: 1 Visit OT Treatments $Self Care/Home Management : 53-67 mins  Lodema Hong, Adeline  Office [place] 02/26/2022, 1:37 PM

## 2022-02-27 DIAGNOSIS — R933 Abnormal findings on diagnostic imaging of other parts of digestive tract: Secondary | ICD-10-CM | POA: Diagnosis not present

## 2022-02-27 DIAGNOSIS — R579 Shock, unspecified: Secondary | ICD-10-CM | POA: Diagnosis not present

## 2022-02-27 DIAGNOSIS — D62 Acute posthemorrhagic anemia: Secondary | ICD-10-CM | POA: Diagnosis not present

## 2022-02-27 DIAGNOSIS — G9341 Metabolic encephalopathy: Secondary | ICD-10-CM | POA: Diagnosis not present

## 2022-02-27 LAB — GLUCOSE, CAPILLARY
Glucose-Capillary: 198 mg/dL — ABNORMAL HIGH (ref 70–99)
Glucose-Capillary: 153 mg/dL — ABNORMAL HIGH (ref 70–99)
Glucose-Capillary: 159 mg/dL — ABNORMAL HIGH (ref 70–99)
Glucose-Capillary: 217 mg/dL — ABNORMAL HIGH (ref 70–99)

## 2022-02-27 MED ORDER — MELATONIN 5 MG PO TABS
10.0000 mg | ORAL_TABLET | Freq: Once | ORAL | Status: AC
  Administered 2022-02-27: 10 mg via ORAL
  Filled 2022-02-27: qty 2

## 2022-02-27 NOTE — Progress Notes (Signed)
PROGRESS NOTE    Jennifer Pacheco  TTS:[id number] DOB: 1956-04-01 DOA: 01/30/2022 PCP: [name], NP   Brief Narrative:  66 year old F with PMH of DM-2 and HTN presented to [facility] ED on 6/2 with AMS after she was found down altered in feces and urine by someone who went to check on her.  She reports difficulty getting up after fall on her left buttock.  Unclear how long she was down.  She was found to be hypoxic and hypotensive.  She was started on IV fluids, vasopressors, supplemental oxygen and broad-spectrum antibiotics.  CT chest showed submassive PE with RV/LV ratio 1.2-1.5.  She was started on heparin. CT abd and UA concerning for UTI w/ possible pyelonephritis. CXR unremarkable. 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 NSG consulted recommend MRI and decadron, and transfer to [facility].     Patient was in ICU since arrival at [facility].  She had ABLA likely due to retroperitoneal bleed.  Heparin was discontinued.  She had IVC filter placed.  She came off vasopressor on 6/5.  [facility] on 6/8.  She is currently stable on room air.  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  Neurosurgery recommends outpatient follow-up in 2 to 3 months for reevaluation and possible surgical planning. Therapy evaluation recommending SNF.  APS involved in the case.  Palliative care medicine following.  She is medically stable for discharge at this time. She has a court date set for July 7 at 10 AM, moving forward with guardianship. No new events overnight.  Currently pending placement and safe dispo.    Assessment & Plan:  Principal Problem:   Acute pulmonary embolism with acute cor pulmonale (HCC) Active Problems:    Hypovolemic shock (HCC)   Brain mass with vasogenic edema and mass effect   ABLA likely due to retroperitoneal bleed   Esophageal dysphagia   Abnormal CT scan, esophagus   Retroperitoneal hematoma   Acute metabolic encephalopathy   Acute respiratory failure with hypoxia (HCC)   Brain edema (HCC)   Pressure injury of skin   Distal esophageal thickening   Electrolyte abnormality   Controlled NIDDM-2   Cholelithiasis   Physical deconditioning   Bandemia   AKI (acute kidney injury) (HCC)   Hyponatremia   Elevated troponin   Lactic acidosis   Cognitive communication deficit    Acute pulmonary embolism with acute cor pulmonale (HCC) --Submassive PE with RV to LV ratio of 1.28-1.54.  TTE normal without evidence of right heart strain.   --She was in shock on arrival;/treatment complicated by acute blood loss anemia and retroperitoneal bleed therefore heparin drip turned off on 6/5.  IVC filter placed 6/5 by IR. --On Lovenox ppx.    Hypovolemic shock ([place]) Septic shock secondary to acute pyelonephritis --His issues have been resolved and treated.  Medically stable now. --Continue to follow hemoglobin trend.   ABLA likely due to retroperitoneal bleed -Off anticoagulation.  Hemoglobin stable around 11.  No further signs of blood loss.  Hold off on any further blood work unless if it becomes necessary. -- Avoid the use of NSAIDs.    Brain mass with vasogenic edema and mass effect -CT and MRI brain showed 5.2 x 5.3 cm large right basal  ganglia mass with surrounding edema and 11 mm left midline shift and near complete effacement of right lateral ventricle.   -Neurosurgery recommends patient follow-up in 2-3 months grams orally daily. On Decadron '4mg'$  po daily until seen by outptn NeuroSx.    Acute metabolic encephalopathy -Has poor insight and some cognitive deficits. -Following commands appropriately and able to be redirected.   Abnormal CT scan, esophagus -Distal esophageal thickening  at GE junction suspicious for neoplasm or severe esophagitis. -Will need outpatient endoscopy.  PPI twice daily   Esophageal dysphagia -Dysphagia 3 diet per speech eval.  PPI twice daily   Lactic acidosis -Resolved.   Elevated troponin -Likely demand ischemia in the setting of right heart strain from PE.   -Pt denies any chest pain or sob.    Hyponatremia/hypokalemia --repleted --follow electrolytes and further replete as needed    AKI (acute kidney injury) ([place]) -Resolved.  --continue To follow renal function trend intermittently.   Bandemia -From steroids -No complaints or signs of acute infection currently.   Poor social situation - Palliative care and APS involved.  Family around to make decision.  Per documentation court date is set on 7/7) to pursued guardianship. --Case manager/social worker aware to help with placement.   Controlled NIDDM-2 -A1c 5.5%. -Continue sliding scale and Accu-Chek while on Decadron  -Continue adjusting hypoglycemic regimen as required;  Based on CBG fluctuation   Acute respiratory failure with hypoxia (HCC) -Resolved.   -Patient not requiring oxygen supplementation; good saturation on room air.     DVT prophylaxis: Subcu Lovenox Code Status: Full code Family Communication: None  Status is: Inpatient Remains inpatient appropriate because: Awaiting safe disposition   Nutritional status   Body mass index is 24.87 kg/m.  Pressure Injury 01/30/22 Sacrum Right;Medial Stage 2 -  Partial thickness loss of dermis presenting as a shallow open injury with a red, pink wound bed without slough. (Active)  01/30/22 2053  Location: Sacrum  Location Orientation: Right;Medial  Staging: Stage 2 -  Partial thickness loss of dermis presenting as a shallow open injury with a red, pink wound bed without slough.  Wound Description (Comments):   Present on Admission: Yes     Pressure Injury 01/30/22 Coccyx Lower;Medial Stage 2 -  Partial thickness  loss of dermis presenting as a shallow open injury with a red, pink wound bed without slough. (Active)  01/30/22 2053  Location: Coccyx  Location Orientation: Lower;Medial  Staging: Stage 2 -  Partial thickness loss of dermis presenting as a shallow open injury with a red, pink wound bed without slough.  Wound Description (Comments):   Present on Admission: Yes   Subjective: No overnight events, no fever, no chest pain, no nausea, no vomiting.  Continues to be deconditioned with poor balance.  Examination: General exam: In no acute distress; denies headache, no chest pain, no nausea vomiting.  Having breakfast at time of examination. Respiratory system: Good saturation on 2 L, no using accessory muscles.  No wheezing or crackles. Cardiovascular system:RRR. No rubs or gallops; no JVD. Gastrointestinal system: Abdomen is nondistended, soft and nontender. No organomegaly or masses felt. Normal bowel sounds heard. Central nervous system: Alert and oriented. No focal neurological deficits. Extremities: No cyanosis or clubbing; no edema appreciated on exam. Skin: No petechiae.  Stage II pressure injury to coccyx; no superimposed infection. Psychiatry: Stable mood.  Judgment and insight appears to be impaired secondary to underlying psychiatric condition  Objective: Vitals:   02/25/22 1938 02/26/22 0746 02/26/22 2230 02/27/22 9470  BP: 106/67 129/87 119/74 113/86  Pulse: 80 (!) 55 65 69  Resp:  '16 16 16  '$ Temp: 98.3 F (36.8 C) 98.3 F (36.8 C) 97.8 F (36.6 C) 98.5 F (36.9 C)  TempSrc: Oral Oral Oral Oral  SpO2: 100% 100% 97% 97%  Weight:      Height:        Intake/Output Summary (Last 24 hours) at 02/27/2022 1622 Last data filed at 02/27/2022 0800 Gross per 24 hour  Intake 480 ml  Output 1050 ml  Net -570 ml   Filed Weights   02/07/22 0325 02/12/22 0424 02/16/22 0500  Weight: 71.1 kg 68 kg 67.8 kg     Data Reviewed:   CBC: Recent Labs  Lab 02/23/22 0841 02/26/22 0623   WBC 7.6 7.7  NEUTROABS 5.0  --   HGB 12.5 11.9*  HCT 38.1 36.1  MCV 89.0 89.1  PLT 412* 614   Basic Metabolic Panel: Recent Labs  Lab 02/23/22 0841 02/26/22 0623  NA 131* 137  K 3.4* 4.3  CL 96* 100  CO2 26 26  GLUCOSE 159* 89  BUN 14 14  CREATININE 0.72 0.71  CALCIUM 9.0 9.0   GFR: Estimated Creatinine Clearance: 63.1 mL/min (by C-G formula based on SCr of 0.71 mg/dL).  CBG: Recent Labs  Lab 02/26/22 1637 02/26/22 2227 02/27/22 0844 02/27/22 1136 02/27/22 1140  GLUCAP 199* 133* 198* 153* 159*    Radiology Studies: No results found.   Scheduled Meds:  dexamethasone  4 mg Oral Daily   docusate sodium  100 mg Oral BID   enoxaparin (LOVENOX) injection  40 mg Subcutaneous Q24H   gabapentin  300 mg Oral BID   insulin aspart  0-9 Units Subcutaneous TID AC & HS   pantoprazole  40 mg Oral BID   polyethylene glycol  17 g Oral BID   senna  1 tablet Oral QHS   Continuous Infusions:  sodium chloride Stopped (02/02/22 0929)     LOS: 28 days    Barton Dubois, MD [facility]  If 7PM-7AM, please contact night-coverage  02/27/2022, 4:22 PM

## 2022-02-27 NOTE — Plan of Care (Signed)

## 2022-02-27 NOTE — Progress Notes (Signed)
Physical Therapy Treatment Patient Details Name: Jennifer Pacheco MRN: [id number] DOB: Mar 18, 1956 Today's Date: 02/27/2022   History of Present Illness Pt is 66 yo female who was found down in her home and taken to [facility]. 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 Pt with retroperitoneal bleed and IVC filter placed 6/5. PMH: DM2, HTN.    PT Comments    Pt supine in bed on arrival this session.  Pt require max VCs and direct cueing to participate in PT session.  Performed progression to short bouts of gt training with close WC follow.  Pt is very unbalanced and required mod assistance overall for mobility.  Continue to recommend rehab in a post acute setting.     Recommendations for follow up therapy are one component of a multi-disciplinary discharge planning process, led by the attending physician.  Recommendations may be updated based on patient status, additional functional criteria and insurance authorization.  Follow Up Recommendations  Skilled nursing-short term rehab (<3 hours/day)     Assistance Recommended at Discharge Frequent or constant Supervision/Assistance  Patient can return home with the following A little help with bathing/dressing/bathroom;Assistance with cooking/housework;Direct supervision/assist for medications management;Direct supervision/assist for financial management;Assist for transportation;Help with stairs or ramp for entrance;Two people to help with walking and/or transfers   Equipment Recommendations  Rolling walker (2 wheels);BSC/3in1    Recommendations for Other Services       Precautions / Restrictions Precautions Precautions: Fall Precaution Comments: HOH Restrictions Weight Bearing Restrictions: No     Mobility  Bed Mobility Overal bed mobility: Needs Assistance Bed Mobility: Supine to Sit, Sit to Supine     Supine to sit: Mod assist Sit to supine: Mod assist, +2 for physical assistance    General bed mobility comments: Pt required assistance for LE advancement to and from bed and for trunk control.  Pt continue to present with LOB posteriorly but appears purposeful at times.    Transfers Overall transfer level: Needs assistance Equipment used: Rolling walker (2 wheels) Transfers: Sit to/from Stand Sit to Stand: Min assist, Mod assist, Max assist (assist level varried this session based on her cognition.)           General transfer comment: Pt required cues for hand placement and assistance to lift into standing.    Ambulation/Gait Ambulation/Gait assistance: Mod assist, +2 safety/equipment Gait Distance (Feet): 6 Feet (+ 12 ft) Assistive device: Rolling walker (2 wheels) Gait Pattern/deviations: Shuffle, Trunk flexed, Step-to pattern, WFL(Within Functional Limits), Wide base of support Gait velocity: decreased     General Gait Details: Pt remains with short stride and low foot clearance. Presents with wide BOS and pushing device too far forward.  At time noted to step out of device.   Stairs             Wheelchair Mobility    Modified Rankin (Stroke Patients Only)       Balance Overall balance assessment: Needs assistance Sitting-balance support: Feet supported, No upper extremity supported Sitting balance-Leahy Scale: Fair       Standing balance-Leahy Scale: Poor Standing balance comment: demonstrated left lateral leaning while standing                            Cognition Arousal/Alertness: Awake/alert Behavior During Therapy: Flat affect Overall Cognitive Status: No family/caregiver present to determine baseline cognitive functioning Area of Impairment: Attention, Following commands, Safety/judgement, Awareness, Problem solving  Orientation Level: Disoriented to, Time, Situation Current Attention Level: Sustained Memory: Decreased recall of precautions, Decreased short-term memory Following Commands:  Follows one step commands inconsistently, Follows one step commands with increased time Safety/Judgement: Decreased awareness of safety, Decreased awareness of deficits Awareness: Intellectual Problem Solving: Difficulty sequencing, Requires verbal cues, Requires tactile cues, Slow processing, Decreased initiation General Comments: Asking therapist to put on her bra when she is loosing balance in sitting.        Exercises Other Exercises Other Exercises: Pt performed knee extension B propelling WC back to her room x 80 ft.    General Comments        Pertinent Vitals/Pain Pain Assessment Pain Assessment: Faces Faces Pain Scale: Hurts a little bit Pain Location: rectum Pain Descriptors / Indicators: Discomfort, Grimacing Pain Intervention(s): Monitored during session, Repositioned    Home Living                          Prior Function            PT Goals (current goals can now be found in the care plan section) Acute Rehab PT Goals Patient Stated Goal: To get a piece of candy, a paper and ice cream. Potential to Achieve Goals: Fair Progress towards PT goals: Progressing toward goals    Frequency    Min 3X/week      PT Plan Current plan remains appropriate    Co-evaluation              AM-PAC PT "6 Clicks" Mobility   Outcome Measure  Help needed turning from your back to your side while in a flat bed without using bedrails?: A Little Help needed moving from lying on your back to sitting on the side of a flat bed without using bedrails?: A Lot Help needed moving to and from a bed to a chair (including a wheelchair)?: A Lot Help needed standing up from a chair using your arms (e.g., wheelchair or bedside chair)?: A Lot Help needed to walk in hospital room?: Total Help needed climbing 3-5 steps with a railing? : Total 6 Click Score: 11    End of Session Equipment Utilized During Treatment: Gait belt Activity Tolerance: Patient tolerated  treatment well;Other (comment) Patient left: with call bell/phone within reach;Other (comment);in chair;with chair alarm set Nurse Communication: Mobility status PT Visit Diagnosis: Unsteadiness on feet (R26.81);Difficulty in walking, not elsewhere classified (R26.2)     Time: 6301-6010 PT Time Calculation (min) (ACUTE ONLY): 45 min  Charges:  $Gait Training: 8-22 mins $Therapeutic Activity: 23-37 mins                     Erasmo Leventhal , PTA Acute Rehabilitation Services Office [phone]    Cristela Blue 02/27/2022, 3:44 PM

## 2022-02-28 DIAGNOSIS — J9601 Acute respiratory failure with hypoxia: Secondary | ICD-10-CM | POA: Diagnosis not present

## 2022-02-28 DIAGNOSIS — G9341 Metabolic encephalopathy: Secondary | ICD-10-CM | POA: Diagnosis not present

## 2022-02-28 DIAGNOSIS — D62 Acute posthemorrhagic anemia: Secondary | ICD-10-CM | POA: Diagnosis not present

## 2022-02-28 DIAGNOSIS — I2609 Other pulmonary embolism with acute cor pulmonale: Secondary | ICD-10-CM | POA: Diagnosis not present

## 2022-02-28 LAB — GLUCOSE, CAPILLARY
Glucose-Capillary: 163 mg/dL — ABNORMAL HIGH (ref 70–99)
Glucose-Capillary: 156 mg/dL — ABNORMAL HIGH (ref 70–99)
Glucose-Capillary: 181 mg/dL — ABNORMAL HIGH (ref 70–99)
Glucose-Capillary: 184 mg/dL — ABNORMAL HIGH (ref 70–99)

## 2022-02-28 MED ORDER — MELATONIN 5 MG PO TABS
10.0000 mg | ORAL_TABLET | Freq: Once | ORAL | Status: AC
  Administered 2022-02-28: 10 mg via ORAL
  Filled 2022-02-28: qty 2

## 2022-02-28 NOTE — Assessment & Plan Note (Addendum)
06/06 CT with large left sided retroperitoneal hematoma with significant mass effect.   Her hgb has been stable, with no signs of recurrent bleeding. Off anticoagulation.

## 2022-02-28 NOTE — Plan of Care (Signed)

## 2022-02-28 NOTE — Plan of Care (Signed)

## 2022-02-28 NOTE — Progress Notes (Signed)
Progress Note   Patient: Jennifer Pacheco YYT:[id number] DOB: [birth year] DOA: 01/30/2022     29 DOS: the patient was seen and examined on 02/28/2022   Brief hospital course: 66 year old F with PMH of DM-2 and HTN presented to [facility] ED on 6/2 with AMS after she was found down altered in feces and urine by someone who went to check on her.  She reports difficulty getting up after fall on her left buttock.  Unclear how long she was down.  She was found to be hypoxic and hypotensive.  She was started on IV fluids, vasopressors, supplemental oxygen and broad-spectrum antibiotics.  CT chest showed submassive PE with RV/LV ratio 1.2-1.5.  She was started on heparin. CT abd and UA concerning for UTI w/ possible pyelonephritis. CXR unremarkable. 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 NSG consulted recommend MRI and decadron, and transfer to [facility].    Patient was in ICU since arrival at [facility].  She had ABLA likely due to retroperitoneal bleed.  Heparin was discontinued.  She had IVC filter placed.  She came off vasopressor on 6/5.  She was transferred to [facility] on 6/8.  She is currently stable on room air.    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  Neurosurgery recommends outpatient follow-up in 2 to 3 months for reevaluation and possible surgical planning.  Therapy evaluation recommending SNF.  APS involved in the case.  Palliative care medicine following.  She is medically stable for discharge at this time. She has a court date set for July 7 at 10 AM, moving forward with guardianship. No new events overnight.   Currently pending placement and safe dispo.     Assessment and Plan: * Acute pulmonary embolism with acute cor  pulmonale (HCC) Submassive PE with RV to LV ratio of 1.28-1.54.  TTE normal without evidence of right heart strain.  She was in shock on arrival.  She was started on IV heparin, which was later discontinued due to [facility] and retroperitoneal bleed.  She has been off IV heparin since  6/5.  She is currently on room air.  ABLA likely due to retroperitoneal bleed 06/06 CT with large left sided retroperitoneal hematoma with significant mass effect.   hgb has been stable at 11,9  Today with no signs of bleeding.    Acute metabolic encephalopathy Multifactorial including acute illness, brain mass, shock and possible underlying cognitive issue.  Per PCCM note fluid talk to 1 of patient's brother, seems to have some sort of psychiatric issues since childhood.  Largely estranged from family and does "strange things" and fails to recognize her brother at times.  She is currently awake and oriented to self, place, person, time and some of the situations.  She seems to have impaired hearing which could contribute. -Reorientation and delirium precautions  Patient has not used haloperidol since 06/05. Plan to stop haloperidol for now.   Brain mass with vasogenic edema and mass effect CT and MRI brain showed 5.2 x 5.3 cm large right basal ganglia mass with surrounding edema and 11 mm left midline shift and near complete effacement of right lateral ventricle.  This is probably what led to her fall and altered mental status.  She was started on Decadron.  Neurosurgery consulted.  Currently on 4 mg daily of dexamethasone.   Plan to follow up as outpatient with neurology.  Patient has residual left sided hemiparesis, more on her lower than upper extremity.  Pending transfer to SNF.   Esophageal dysphagia Likely in the setting of distal esophageal thickening. -Dysphagia 3 diet per SLP  Distal esophageal thickening at GE junction suspicious for neoplasm or severe esophagitis.  Continue with bid pantoprazole.    Hypovolemic shock (HCC) Likely from poor p.o. intake and dehydration.  However, she could be in shock from PE and sepsis in the setting of possible UTI/pyelonephritis.  She has been off vasopressors since 6/5.  Now hemodynamically stable.  Shock has resolved.   Acute respiratory failure with hypoxia (HCC) Resolved.  On room air.  AKI (acute kidney injury) ([place]) Hyponatremia and hypokalemia.   Likely prerenal.  Resolved.  Physical deconditioning Patient lives alone.  Reportedly estranged from family members.  She was found down on feces and urine before arrival.  It seems APS has been involved.  PCCM was able to Google search and talk to patient's brother.  She has a friend visiting.  Palliative medicine consulted for goals of care discussion.  Therapy recommends SNF.  Controlled NIDDM-2 A1c 5.5%. Recent Labs  Lab 02/05/22 1119 02/05/22 1843 02/05/22 2312 02/06/22 0620 02/06/22 1248  GLUCAP 149* 132* 143* 140* 176*  Continue SSI         Subjective: Patient with no chest pain or dyspnea, no agitation. Tolerating po well.   Physical Exam: Vitals:   02/27/22 0903 02/27/22 1936 02/28/22 0500 02/28/22 0810  BP: 113/86 121/81  104/77  Pulse: 69 85  97  Resp: 16   20  Temp: 98.5 F (36.9 C) 97.6 F (36.4 C)  98.2 F (36.8 C)  TempSrc: Oral Oral  Oral  SpO2: 97% 96%  98%  Weight:   69.4 kg   Height:       Neurology, positive left hemiparesis, more left lower than left upper extremity, she follows commands and answers to simple questions ENT with mild pallor Cardiovascular with S1 and S2 present and rhythmic Respiratory with no rales or wheezing on anterior auscultation  Abdomen not distended Trace lower extremity edema  Data Reviewed:    Family Communication: no family at the bedside   Disposition: Status is: Inpatient Remains inpatient appropriate because: pending placement, patient is medically sable for discharge   Planned Discharge Destination: Skilled  nursing facility    Author: Tawni Millers, MD 02/28/2022 11:36 AM  For on call review [email or web address].

## 2022-02-28 NOTE — Progress Notes (Signed)
Mobility Specialist Criteria Algorithm Info.   02/28/22 1115  Mobility  Activity Transferred from bed to chair  Range of Motion/Exercises Active;All extremities  Level of Assistance Moderate assist, patient does 50-74% (Max A bed mobility)  Assistive Device Other (Comment) (HHA)  Activity Response Tolerated fair (max cues)   Patient received in supine sleep but easily aroused. Reluctant to participate initially, stating she already worked with therapy when in fact she didn't. With encouragement and reorientation of time and situation, she agreed to participate. Required max cues for sequencing throughout. Needed max A to EOB from supine>sit. Attempted sit to stand x3 with RW but was resistant to cues, making it unsafe. Completed successful squat pivot  to recliner, mod A with HHA.  Was left in chair with all needs met, call bell in reach and chair alarm set.    02/28/2022 12:01 PM  [place] Zeah Germano, [place], [place]  HNPMV:[phone] Office: [phone]

## 2022-03-01 DIAGNOSIS — J9601 Acute respiratory failure with hypoxia: Secondary | ICD-10-CM | POA: Diagnosis not present

## 2022-03-01 DIAGNOSIS — I2609 Other pulmonary embolism with acute cor pulmonale: Secondary | ICD-10-CM | POA: Diagnosis not present

## 2022-03-01 DIAGNOSIS — G9341 Metabolic encephalopathy: Secondary | ICD-10-CM | POA: Diagnosis not present

## 2022-03-01 DIAGNOSIS — D62 Acute posthemorrhagic anemia: Secondary | ICD-10-CM | POA: Diagnosis not present

## 2022-03-01 LAB — GLUCOSE, CAPILLARY
Glucose-Capillary: 118 mg/dL — ABNORMAL HIGH (ref 70–99)
Glucose-Capillary: 147 mg/dL — ABNORMAL HIGH (ref 70–99)
Glucose-Capillary: 200 mg/dL — ABNORMAL HIGH (ref 70–99)
Glucose-Capillary: 162 mg/dL — ABNORMAL HIGH (ref 70–99)

## 2022-03-01 NOTE — Assessment & Plan Note (Addendum)
Present on admission.   Active Pressure Injury/Wound(s)     Pressure Ulcer  Duration          Pressure Injury 01/30/22 Coccyx Lower;Medial Stage 2 -  Partial thickness loss of dermis presenting as a shallow open injury with a red, pink wound bed without slough. 29 days   Pressure Injury 01/30/22 Sacrum Right;Medial Stage 2 -  Partial thickness loss of dermis presenting as a shallow open injury with a red, pink wound bed without slough. 29 days

## 2022-03-01 NOTE — Progress Notes (Signed)
Progress Note   Patient: Jennifer Pacheco PIR:[id number] DOB: February 24, 1956 DOA: 01/30/2022     30 DOS: the patient was seen and examined on 03/01/2022   Brief hospital course: 66 year old F with PMH of DM-2 and HTN presented to [facility] ED on 6/2 with AMS after she was found down altered in feces and urine by someone who went to check on her.  She reports difficulty getting up after fall on her left buttock.  Unclear how long she was down.  She was found to be hypoxic and hypotensive.  She was started on IV fluids, vasopressors, supplemental oxygen and broad-spectrum antibiotics.  CT chest showed submassive PE with RV/LV ratio 1.2-1.5.  She was started on heparin. CT abd and UA concerning for UTI w/ possible pyelonephritis. CXR unremarkable. 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 NSG consulted recommend MRI and decadron, and transfer to [facility].    Patient was in ICU since arrival at [facility].  She had ABLA likely due to retroperitoneal bleed.  Heparin was discontinued.  She had IVC filter placed.  She came off vasopressor on 6/5.  She was transferred [facility] on 6/8.  She is currently stable on room air.    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  Neurosurgery recommends outpatient follow-up in 2 to 3 months for reevaluation and possible surgical planning.  Therapy evaluation recommending SNF.  APS involved in the case.  Palliative care medicine following.  She is medically stable for discharge at this time. She has a court date set for July 7 at 10 AM, moving forward with guardianship. No new events overnight.   Currently pending placement and safe dispo.     Assessment and Plan: * Acute pulmonary embolism with acute cor  pulmonale (HCC) Submassive PE with RV to LV ratio of 1.28-1.54.  TTE normal without evidence of right heart strain.  She was in shock on arrival.  She was started on IV heparin, which was later discontinued due to acute blood loss anemia and retroperitoneal bleed.   06/05 discontinued heparin drip. Patient has a IVC filter in place that was placed on 02/02/22 per IR.    She is currently on room air with no chest pain or dyspnea.   ABLA likely due to retroperitoneal bleed 06/06 CT with large left sided retroperitoneal hematoma with significant mass effect.   Her hgb has been stable, with no signs of recurrent bleeding. Off anticoagulation.    Acute metabolic encephalopathy Multifactorial including acute illness, brain mass, shock and possible underlying cognitive issue.  Per PCCM note fluid talk to 1 of patient's brother, seems to have some sort of psychiatric issues since childhood.  Largely estranged from family and does "strange things" and fails to recognize her brother at times.  She is currently awake and oriented to self, place, person, time and some of the situations.  She seems to have impaired hearing which could contribute. -Reorientation and delirium precautions  No further agitation, patient is medically stable for discharge to SNF to continue physical therapy and reconditioning.   Brain mass with vasogenic edema and mass effect CT and MRI brain showed 5.2 x 5.3 cm large right basal ganglia mass with surrounding edema and 11 mm left midline shift and near  complete effacement of right lateral ventricle.  This is probably what led to her fall and altered mental status.  She was started on Decadron.  Neurosurgery consulted.  Currently on 4 mg daily of dexamethasone.   Plan to follow up as outpatient with neurology.  Patient has residual left sided hemiparesis, more on her lower than upper extremity.  Pending transfer to SNF.   Esophageal dysphagia Likely in the setting of distal  esophageal thickening. -Dysphagia 3 diet per SLP  Distal esophageal thickening at GE junction suspicious for neoplasm or severe esophagitis.  Continue with bid pantoprazole.   Hypovolemic shock (HCC) Likely from poor p.o. intake and dehydration.  However, she could be in shock from PE and sepsis in the setting of possible UTI/pyelonephritis.  She has been off vasopressors since 6/5.  Now hemodynamically stable.  Shock has resolved.   Acute respiratory failure with hypoxia (HCC) Resolved.  On room air.  AKI (acute kidney injury) ([place]) Hyponatremia and hypokalemia.   Likely prerenal.  Resolved.  Controlled NIDDM-2 Her capillary glucose has been well controlled. 118, 184. 181. Continue with insulin sliding scale for glucose cover and monitoring.    Physical deconditioning Patient lives alone.  Reportedly estranged from family members.  She was found down on feces and urine before arrival.  It seems APS has been involved.  PCCM was able to Google search and talk to patient's brother.  She has a friend visiting.  Palliative medicine consulted for goals of care discussion.  Therapy recommends SNF.  Pressure injury of skin Present on admission.   Active Pressure Injury/Wound(s)     Pressure Ulcer  Duration          Pressure Injury 01/30/22 Coccyx Lower;Medial Stage 2 -  Partial thickness loss of dermis presenting as a shallow open injury with a red, pink wound bed without slough. 29 days   Pressure Injury 01/30/22 Sacrum Right;Medial Stage 2 -  Partial thickness loss of dermis presenting as a shallow open injury with a red, pink wound bed without slough. 29 days                   Subjective: Patient has been stable, tolerating po well, no agitation.   Physical Exam: Vitals:   02/28/22 0810 02/28/22 1930 03/01/22 0503 03/01/22 0735  BP: 104/77 112/75  103/74  Pulse: 97 70  66  Resp: 20 20    Temp: 98.2 F (36.8 C) 97.6 F (36.4 C)  98.4 F (36.9 C)  TempSrc: Oral  Oral  Oral  SpO2: 98% 97%  98%  Weight:   69.4 kg   Height:       Neurology awake and alert, continue to have left sided weakness, slowly improving ENT with no pallor Cardiovascular with S1 and S2 present and rhythmic Respiratory with no rales or wheezing Abdomen not distended No lower extremity edema  Data Reviewed:    Family Communication: no family at the bedside   Disposition: Status is: Inpatient Remains inpatient appropriate because: pending placement   Planned Discharge Destination:  to be determined.     Author: Tawni Millers, MD 03/01/2022 11:10 AM  For on call review [email or web address].

## 2022-03-02 DIAGNOSIS — I2609 Other pulmonary embolism with acute cor pulmonale: Secondary | ICD-10-CM | POA: Diagnosis not present

## 2022-03-02 DIAGNOSIS — G9341 Metabolic encephalopathy: Secondary | ICD-10-CM | POA: Diagnosis not present

## 2022-03-02 DIAGNOSIS — J9601 Acute respiratory failure with hypoxia: Secondary | ICD-10-CM | POA: Diagnosis not present

## 2022-03-02 DIAGNOSIS — D62 Acute posthemorrhagic anemia: Secondary | ICD-10-CM | POA: Diagnosis not present

## 2022-03-02 LAB — GLUCOSE, CAPILLARY
Glucose-Capillary: 151 mg/dL — ABNORMAL HIGH (ref 70–99)
Glucose-Capillary: 103 mg/dL — ABNORMAL HIGH (ref 70–99)
Glucose-Capillary: 197 mg/dL — ABNORMAL HIGH (ref 70–99)
Glucose-Capillary: 230 mg/dL — ABNORMAL HIGH (ref 70–99)

## 2022-03-02 LAB — CREATININE, SERUM
Creatinine, Ser: 0.75 mg/dL (ref 0.44–1.00)
GFR, Estimated: >60 mL/min (ref >60)

## 2022-03-02 MED ORDER — METFORMIN HCL 500 MG PO TABS
500.0000 mg | ORAL_TABLET | Freq: Every day | ORAL | Status: DC
  Administered 2022-03-03 – 2022-03-05 (×3): 500 mg via ORAL
  Filled 2022-03-02 (×3): qty 1

## 2022-03-02 NOTE — Progress Notes (Signed)
Physical Therapy Treatment Patient Details Name: Jennifer Pacheco MRN: [id number] DOB: 04-02-1956 Today's Date: 03/02/2022   History of Present Illness Pt is 66 yo female who was found down in her home and taken to [facility]. 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 Pt with retroperitoneal bleed and IVC filter placed 6/5. PMH: DM2, HTN.    PT Comments    Pt supine in bed on arrival this session.  She requires max cues for encouragement and constant re-education on the benefits of mobility this session.  She remains to be a high fall risk and presents with poor safety awareness when mobilizing.  Pt continues to benefit from rehab in a post acute setting.      Recommendations for follow up therapy are one component of a multi-disciplinary discharge planning process, led by the attending physician.  Recommendations may be updated based on patient status, additional functional criteria and insurance authorization.  Follow Up Recommendations  Skilled nursing-short term rehab (<3 hours/day)     Assistance Recommended at Discharge Frequent or constant Supervision/Assistance  Patient can return home with the following A little help with bathing/dressing/bathroom;Assistance with cooking/housework;Direct supervision/assist for medications management;Direct supervision/assist for financial management;Assist for transportation;Help with stairs or ramp for entrance;Two people to help with walking and/or transfers   Equipment Recommendations  Rolling walker (2 wheels);BSC/3in1    Recommendations for Other Services       Precautions / Restrictions Precautions Precautions: Fall Precaution Comments: HOH Restrictions Weight Bearing Restrictions: No     Mobility  Bed Mobility Overal bed mobility: Needs Assistance Bed Mobility: Supine to Sit, Sit to Supine     Supine to sit: Mod assist     General bed mobility comments: Pt has ability to move to edge  of bed with decreased assistance but continues to require mod assistance due to behaviors.    Transfers Overall transfer level: Needs assistance Equipment used: Rolling walker (2 wheels) Transfers: Sit to/from Stand Sit to Stand: Min assist, Mod assist           General transfer comment: Pt required cues for hand placement and assistance to lift into standing.  Performed with RW and HHA.    Ambulation/Gait Ambulation/Gait assistance: Min assist, +2 safety/equipment Gait Distance (Feet): 20 Feet (+ 10 ft) Assistive device: Rolling walker (2 wheels) Gait Pattern/deviations: Shuffle, Trunk flexed, Step-to pattern, WFL(Within Functional Limits), Wide base of support Gait velocity: decreased     General Gait Details: Pt remains with short stride and low foot clearance. Presents with wide BOS and pushing device too far forward.  Continues to have moments of gt instability and stepping outside of the RW to reach for items on the counters at [employer] station.  Constant cues for posture and RW position.   Stairs             Wheelchair Mobility    Modified Rankin (Stroke Patients Only)       Balance Overall balance assessment: Needs assistance Sitting-balance support: Feet supported, No upper extremity supported Sitting balance-Leahy Scale: Fair Sitting balance - Comments: supervision for sitting balance   Standing balance support: Bilateral upper extremity supported, Reliant on assistive device for balance Standing balance-Leahy Scale: Poor                              Cognition Arousal/Alertness: Awake/alert Behavior During Therapy: Flat affect Overall Cognitive Status: No family/caregiver present to determine  baseline cognitive functioning Area of Impairment: Attention, Following commands, Safety/judgement, Awareness, Problem solving                 Orientation Level: Disoriented to, Time, Situation Current Attention Level: Sustained Memory: Decreased  recall of precautions, Decreased short-term memory Following Commands: Follows one step commands inconsistently, Follows one step commands with increased time Safety/Judgement: Decreased awareness of safety, Decreased awareness of deficits Awareness: Intellectual Problem Solving: Difficulty sequencing, Requires verbal cues, Requires tactile cues, Slow processing, Decreased initiation General Comments: Pt perseverates on wanting a snack.        Exercises      General Comments        Pertinent Vitals/Pain Pain Assessment Pain Assessment: Faces Faces Pain Scale: Hurts a little bit Pain Location: rectum Pain Descriptors / Indicators: Discomfort, Grimacing Pain Intervention(s): Monitored during session, Repositioned    Home Living                          Prior Function            PT Goals (current goals can now be found in the care plan section) Acute Rehab PT Goals Patient Stated Goal: To get a piece of candy, a paper and ice cream. Potential to Achieve Goals: Fair Progress towards PT goals: Progressing toward goals    Frequency    Min 3X/week      PT Plan Current plan remains appropriate    Co-evaluation              AM-PAC PT "6 Clicks" Mobility   Outcome Measure  Help needed turning from your back to your side while in a flat bed without using bedrails?: A Little Help needed moving from lying on your back to sitting on the side of a flat bed without using bedrails?: A Lot Help needed moving to and from a bed to a chair (including a wheelchair)?: A Lot Help needed standing up from a chair using your arms (e.g., wheelchair or bedside chair)?: A Lot Help needed to walk in hospital room?: A Lot Help needed climbing 3-5 steps with a railing? : Total 6 Click Score: 12    End of Session Equipment Utilized During Treatment: Gait belt Activity Tolerance: Patient tolerated treatment well;Other (comment) Patient left: with call bell/phone within  reach;Other (comment);in bed;with bed alarm set (in chair position.) Nurse Communication: Mobility status PT Visit Diagnosis: Unsteadiness on feet (R26.81);Difficulty in walking, not elsewhere classified (R26.2)     Time: 2536-6440 PT Time Calculation (min) (ACUTE ONLY): 32 min  Charges:  $Gait Training: 8-22 mins $Therapeutic Activity: 8-22 mins                     Erasmo Leventhal , PTA Acute Rehabilitation Services Office [phone]    Leonidus Rowand Eli Hose 03/02/2022, 3:07 PM

## 2022-03-02 NOTE — Progress Notes (Addendum)
Progress Note   Patient: Jennifer Pacheco CXK:[id number] DOB: 04/02/1956 DOA: 01/30/2022     31 DOS: the patient was seen and examined on 03/02/2022   Brief hospital course: 66 year old F with PMH of DM-2 and HTN presented to [facility] ED on 6/2 with AMS after she was found down altered in feces and urine by someone who went to check on her.  She reports difficulty getting up after fall on her left buttock.  Unclear how long she was down.  She was found to be hypoxic and hypotensive.  She was started on IV fluids, vasopressors, supplemental oxygen and broad-spectrum antibiotics.  CT chest showed submassive PE with RV/LV ratio 1.2-1.5.  She was started on heparin. CT abd and UA concerning for UTI w/ possible pyelonephritis. CXR unremarkable. 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 NSG consulted recommend MRI and decadron, and transfer to [facility].    Patient was in ICU since arrival at [facility].  She had ABLA likely due to retroperitoneal bleed.  Heparin was discontinued.  She had IVC filter placed.  She came off vasopressor on 6/5.  She was transferred to [facility] service on 6/8.  She is currently stable on room air.    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  Neurosurgery recommends outpatient follow-up in 2 to 3 months for reevaluation and possible surgical planning.  Therapy evaluation recommending SNF.  APS involved in the case.  Palliative care medicine following.  She is medically stable for discharge at this time. She has a court date set for July 7 at 10 AM, moving forward with guardianship. No new events overnight.   Currently pending placement and safe dispo.     Assessment and Plan: * Acute pulmonary embolism with acute cor  pulmonale (HCC) Submassive PE with RV to LV ratio of 1.28-1.54.  TTE normal without evidence of right heart strain.  She was in shock on arrival.  She was started on IV heparin, which was later discontinued due to acute blood loss anemia and retroperitoneal bleed.   06/05 discontinued heparin drip. Patient has a IVC filter in place that was placed on 02/02/22 per IR.    She is currently on room air with no chest pain or dyspnea.   ABLA likely due to retroperitoneal bleed 06/06 CT with large left sided retroperitoneal hematoma with significant mass effect.   Her hgb has been stable, with no signs of recurrent bleeding. Off anticoagulation.    Acute metabolic encephalopathy Multifactorial including acute illness, brain mass, shock and possible underlying cognitive issue.  Per PCCM note fluid talk to 1 of patient's brother, seems to have some sort of psychiatric issues since childhood.  Largely estranged from family and does "strange things" and fails to recognize her brother at times.  She is currently awake and oriented to self, place, person, time and some of the situations.  She seems to have impaired hearing which could contribute. -Reorientation and delirium precautions  No further agitation, patient is medically stable for discharge to SNF to continue physical therapy and reconditioning.   Brain mass with vasogenic edema and mass effect CT and MRI brain showed 5.2 x 5.3 cm large right basal ganglia mass with surrounding edema and 11 mm left midline shift and near  complete effacement of right lateral ventricle.  This is probably what led to her fall and altered mental status.  She was started on Decadron.  Neurosurgery consulted.  Currently on 4 mg daily of dexamethasone.   Plan to follow up as outpatient with neurology.  Patient has residual left sided hemiparesis, more on her lower than upper extremity.  Pending transfer to SNF.   Esophageal dysphagia Likely in the setting of distal  esophageal thickening. -Dysphagia 3 diet per SLP  Distal esophageal thickening at GE junction suspicious for neoplasm or severe esophagitis.  Continue with bid pantoprazole.   Hypovolemic shock (HCC) Likely from poor p.o. intake and dehydration.  However, she could be in shock from PE and sepsis in the setting of possible UTI/pyelonephritis.  She has been off vasopressors since 6/5.  Now hemodynamically stable.  Shock has resolved.   Acute respiratory failure with hypoxia (HCC) Resolved.  On room air.  AKI (acute kidney injury) ([place]) Hyponatremia and hypokalemia.   Likely prerenal.  Resolved.  Controlled NIDDM-2 Patient is tolerating po well, her capillary glucose has been well controlled.  Plan to discontinue insulin sq and will resume metformin per her home regimen.    Physical deconditioning Patient lives alone.  Reportedly estranged from family members.  She was found down on feces and urine before arrival.  It seems APS has been involved.  PCCM was able to Google search and talk to patient's brother.  She has a friend visiting.  Palliative medicine consulted for goals of care discussion.  Therapy recommends SNF.  Pressure injury of skin Present on admission.   Active Pressure Injury/Wound(s)     Pressure Ulcer  Duration          Pressure Injury 01/30/22 Coccyx Lower;Medial Stage 2 -  Partial thickness loss of dermis presenting as a shallow open injury with a red, pink wound bed without slough. 29 days   Pressure Injury 01/30/22 Sacrum Right;Medial Stage 2 -  Partial thickness loss of dermis presenting as a shallow open injury with a red, pink wound bed without slough. 29 days                   Subjective: Patient had chest pain this am, that now has resolved, no nausea or vomiting, no dyspnea.   Physical Exam: Vitals:   03/01/22 2011 03/02/22 0705 03/02/22 0740 03/02/22 0825  BP: 106/80 107/79 100/79   Pulse: 82 72 (!) 55 72  Resp:  18    Temp: 98.6 F  (37 C)  98.2 F (36.8 C)   TempSrc: Oral  Oral   SpO2: 98% 98% (!) 88% 98%  Weight:      Height:       Neurology awake and alert Continue to have left sided weakness ENT with no pallor Cardiovascular with S1 and S2 present and rhythmic with no gallops Respiratory with no rales  Abdomen not distended  Data Reviewed:    Family Communication: no family at the bedside   Disposition: Status is: Inpatient Remains inpatient appropriate because: pending placement   Planned Discharge Destination: Skilled nursing facility  Author: Tawni Millers, MD 03/02/2022 3:37 PM  For on call review [email or web address].

## 2022-03-03 DIAGNOSIS — G9341 Metabolic encephalopathy: Secondary | ICD-10-CM | POA: Diagnosis not present

## 2022-03-03 DIAGNOSIS — D62 Acute posthemorrhagic anemia: Secondary | ICD-10-CM | POA: Diagnosis not present

## 2022-03-03 DIAGNOSIS — I2609 Other pulmonary embolism with acute cor pulmonale: Secondary | ICD-10-CM | POA: Diagnosis not present

## 2022-03-03 DIAGNOSIS — J9601 Acute respiratory failure with hypoxia: Secondary | ICD-10-CM | POA: Diagnosis not present

## 2022-03-03 NOTE — Progress Notes (Addendum)
CSW sent patient's clinical information to additional facilities in attempt to obtain bed offers.  The interim guardianship hearing is scheduled for 03/06/2022 at 10am.  Patient's PASSR - #[id number].  Madilyn Fireman, MSW, LCSW Transitions of Care  Clinical Social Worker II [phone]

## 2022-03-03 NOTE — Progress Notes (Signed)
Progress Note   Patient: Jennifer Pacheco JOI:[id number] DOB: Mar 18, 1956 DOA: 01/30/2022     32 DOS: the patient was seen and examined on 03/03/2022   Brief hospital course: 66 year old F with PMH of DM-2 and HTN presented to [facility] ED on 6/2 with AMS after she was found down altered in feces and urine by someone who went to check on her.  She reports difficulty getting up after fall on her left buttock.  Unclear how long she was down.  She was found to be hypoxic and hypotensive.  She was started on IV fluids, vasopressors, supplemental oxygen and broad-spectrum antibiotics.  CT chest showed submassive PE with RV/LV ratio 1.2-1.5.  She was started on heparin. CT abd and UA concerning for UTI w/ possible pyelonephritis. CXR unremarkable. 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 NSG consulted recommend MRI and decadron, and transfer to [facility].    Patient was in ICU since arrival at [facility].  She had ABLA likely due to retroperitoneal bleed.  Heparin was discontinued.  She had IVC filter placed.  She came off vasopressor on 6/5.  She was transferred to [facility] on 6/8.  She is currently stable on room air.    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  Neurosurgery recommends outpatient follow-up in 2 to 3 months for reevaluation and possible surgical planning.  Therapy evaluation recommending SNF.  APS involved in the case.  Palliative care medicine following.  She is medically stable for discharge at this time. She has a court date set for July 7 at 10 AM, moving forward with guardianship. No new events overnight.   Currently pending placement and safe dispo.     Assessment and Plan: * Acute pulmonary embolism with acute cor  pulmonale (HCC) Submassive PE with RV to LV ratio of 1.28-1.54.  TTE normal without evidence of right heart strain.  She was in shock on arrival.  She was started on IV heparin, which was later discontinued due to acute blood loss anemia and retroperitoneal bleed.   06/05 discontinued heparin drip. Patient has a IVC filter in place that was placed on 02/02/22 per IR.    She is currently on room air with no chest pain or dyspnea.   ABLA likely due to retroperitoneal bleed 06/06 CT with large left sided retroperitoneal hematoma with significant mass effect.   Her hgb has been stable, with no signs of recurrent bleeding. Off anticoagulation.    Acute metabolic encephalopathy Multifactorial including acute illness, brain mass, shock and possible underlying cognitive issue.  Per PCCM note fluid talk to 1 of patient's brother, seems to have some sort of psychiatric issues since childhood.  Largely estranged from family and does "strange things" and fails to recognize her brother at times.  She is currently awake and oriented to self, place, person, time and some of the situations.  She seems to have impaired hearing which could contribute. -Reorientation and delirium precautions  No further agitation, patient is medically stable for discharge to SNF to continue physical therapy and reconditioning.   Brain mass with vasogenic edema and mass effect CT and MRI brain showed 5.2 x 5.3 cm large right basal ganglia mass with surrounding edema and 11 mm left midline shift and near  complete effacement of right lateral ventricle.  This is probably what led to her fall and altered mental status.  She was started on Decadron.  Neurosurgery consulted.  Currently on 4 mg daily of dexamethasone.   Plan to follow up as outpatient with neurology.  Patient has residual left sided hemiparesis, more on her lower than upper extremity.  Pending transfer to SNF.   Esophageal dysphagia Likely in the setting of distal  esophageal thickening. -Dysphagia 3 diet per SLP  Distal esophageal thickening at GE junction suspicious for neoplasm or severe esophagitis.  Continue with bid pantoprazole.   Hypovolemic shock (HCC) Likely from poor p.o. intake and dehydration.  However, she could be in shock from PE and sepsis in the setting of possible UTI/pyelonephritis.  She has been off vasopressors since 6/5.  Now hemodynamically stable.  Shock has resolved.   Acute respiratory failure with hypoxia (HCC) Resolved.  On room air.  AKI (acute kidney injury) ([place]) Hyponatremia and hypokalemia.   Likely prerenal.  Resolved.  Controlled NIDDM-2 Patient is tolerating po well, her capillary glucose has been well controlled.  Continue with metformin, insulin has been discontinued, check capillary glucose only as needed.   Physical deconditioning Patient lives alone.  Reportedly estranged from family members.  She was found down on feces and urine before arrival.  It seems APS has been involved.  PCCM was able to Google search and talk to patient's brother.  She has a friend visiting.  Palliative medicine consulted for goals of care discussion.  Therapy recommends SNF.  Pressure injury of skin Present on admission.   Active Pressure Injury/Wound(s)     Pressure Ulcer  Duration          Pressure Injury 01/30/22 Coccyx Lower;Medial Stage 2 -  Partial thickness loss of dermis presenting as a shallow open injury with a red, pink wound bed without slough. 29 days   Pressure Injury 01/30/22 Sacrum Right;Medial Stage 2 -  Partial thickness loss of dermis presenting as a shallow open injury with a red, pink wound bed without slough. 29 days                   Subjective: Patient continue to improve, she is more awake and alert, at the time of my examination is in the chair at the side of the bed. She worked with physical therapy today.   Physical Exam: Vitals:   03/03/22 0500 03/03/22 0515 03/03/22 0916  03/03/22 1704  BP:  115/80 101/76 104/72  Pulse:  74 60 65  Resp:  '18 18 18  '$ Temp:  98.7 F (37.1 C) 98.6 F (37 C) 98.4 F (36.9 C)  TempSrc:  Oral Oral Oral  SpO2:  96% 97% 96%  Weight: 70.1 kg     Height:       Neurology awake and alert, continue to have left sided weakness ENT with mild pallor Cardiovascular with S1 and S2 present and rhythmic Respiratory with no wheezing Abdomen with  no distention  Data Reviewed:    Family Communication: no family at the bedside   Disposition: Status is: Inpatient Remains inpatient appropriate because: pending placement   Planned Discharge Destination: Skilled nursing facility      Author: Tawni Millers, MD 03/03/2022 6:04 PM  For on call review [email or web address].

## 2022-03-03 NOTE — Progress Notes (Signed)
Speech Language Pathology Treatment: Dysphagia  Patient Details Name: Jennifer Pacheco MRN: [id number] DOB: 08-Oct-1955 Today's Date: 03/03/2022 Time: 8937-3428 SLP Time Calculation (min) (ACUTE ONLY): 20 min  Assessment / Plan / Recommendation Clinical Impression  Patient seen by SLP for skilled treatment session focused on dysphagia goals. Patient semi-reclined in bed with pile of cough drops on night stand and pile of peppermint candies on tray table in front of her. She appeared more irritable today as compared to past sessions, saying "dont worry about my stuff!" When SLP asking reason for cough drops, and that she got in an argument with her nurse this morning. She was agreeable to PO's, requesting pudding and ginger ale. She was impulsive with feeding herself pudding, leading to brief oral holding/residuals but no overt s/s aspiration or penetration with thin liquids or puree solids. Patient then requesting ice cream but SLP she would have to wait. (During last visit, patient eating multiple snack cakes that family member had apparently brought and her blood sugar rose to 250's.) At this time, patient appears to be tolerating Dys 3 solids, thin liquids and no expectation of ability to upgrade as patient is without dentition. In addition, no further benefit from cognitive-linguistic intervention and SLP will s/o on swallow function and cognitive-linguistic function at this time. Please feel free to reorder if new needs arise.     HPI HPI: Patient is a 66 year old female presented to [facility] ED on 6/2 with AMS found down at home. Admitted and found to be hypoxic and hypotensive, CT chest showing submassive PE, CT abd and UA concerning for UTI w/ possible pyelonephritis. 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 PHMx: HTN, DMT2      SLP Plan   Discharge SLP treatment due to (comment) (tolerating PO diet and no further benefit from cognitive-linguistic interventions)      Recommendations for follow up therapy are one component of a multi-disciplinary discharge planning process, led by the attending physician.  Recommendations may be updated based on patient status, additional functional criteria and insurance authorization.    Recommendations  Diet recommendations: Dysphagia 3 (mechanical soft);Thin liquid Liquids provided via: Cup;Straw Medication Administration: Whole meds with liquid Supervision: Intermittent supervision to cue for compensatory strategies Compensations: Slow rate;Small sips/bites;Follow solids with liquid;Minimize environmental distractions Postural Changes and/or Swallow Maneuvers: Seated upright 90 degrees                Oral Care Recommendations: Oral care BID Follow Up Recommendations: Skilled nursing-short term rehab (<3 hours/day) Assistance recommended at discharge: Frequent or constant Supervision/Assistance SLP Visit Diagnosis: Dysphagia, oral phase (R13.11) Plan: Discharge SLP treatment due to (comment) (tolerating PO diet and no further benefit from cognitive-linguistic interventions)           [name], MA, CCC-SLP Speech Therapy

## 2022-03-03 NOTE — Progress Notes (Signed)
HOSPITAL MEDICINE OVERNIGHT EVENT NOTE    Notified by nursing that patient is complaining of increasing swelling and pain in the left leg.  Nursing reports palpable pulses with no significant color change or changes in sensation of the associated extremity.  Chart reviewed, patient presented several weeks ago with large submassive pulmonary embolism with bleeding complications secondary to heparin infusion now status post IVC filter placement.  Patient is now only on enoxaparin 40 mg daily as opposed to full dose anticoagulation.  Patient additionally has been found to have a brain mass with vasogenic edema which is likely inducing a hypercoagulable state.  It is conceivable that the patient has developed some degree of lower extremity thromboembolic disease.  There does not seem to be any evidence of cerulea dolens at this time and therefore conservative management is likely the mainstay of treatment here.  We will obtain an ultrasound of the lower extremities to assess.  Nursing to provide Tylenol for pain.  Otherwise, day team to follow on morning rounds.  Vernelle Emerald  MD [facility]

## 2022-03-03 NOTE — Progress Notes (Signed)
Physical Therapy Treatment Patient Details Name: Jennifer Pacheco MRN: [id number] DOB: 1956/02/27 Today's Date: 03/03/2022   History of Present Illness Pt is 66 yo female who was found down in her home and taken to [facility]. 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 Pt with retroperitoneal bleed and IVC filter placed 6/5. PMH: DM2, HTN.    PT Comments    Pt received in supine, agreeable to therapy session with encouragement and conversational but with poor initiation and following of 1-step mobility commands. Pt needing up to +2 maxA for bed mobility and transfers and notably guarding LLE with increased edema in L foot, RN/MD notified. Pt would benefit from [facility] with +2 assist upon return transfer to bed due to increased physical assist needs this date. Pt continues to benefit from PT services to progress toward functional mobility goals.   Recommendations for follow up therapy are one component of a multi-disciplinary discharge planning process, led by the attending physician.  Recommendations may be updated based on patient status, additional functional criteria and insurance authorization.  Follow Up Recommendations  Skilled nursing-short term rehab (<3 hours/day)     Assistance Recommended at Discharge Frequent or constant Supervision/Assistance  Patient can return home with the following A little help with bathing/dressing/bathroom;Assistance with cooking/housework;Direct supervision/assist for medications management;Direct supervision/assist for financial management;Assist for transportation;Help with stairs or ramp for entrance;Two people to help with walking and/or transfers   Equipment Recommendations  Rolling walker (2 wheels);BSC/3in1 (pending progress)    Recommendations for Other Services       Precautions / Restrictions Precautions Precautions: Fall Precaution Comments: HOH Restrictions Weight Bearing Restrictions: No      Mobility  Bed Mobility Overal bed mobility: Needs Assistance Bed Mobility: Supine to Sit     Supine to sit: Max assist, +2 for physical assistance     General bed mobility comments: Poor initiation, needing increased trunk and BLE assist today.    Transfers Overall transfer level: Needs assistance Equipment used: Rolling walker (2 wheels) Transfers: Sit to/from Stand, Bed to chair/wheelchair/BSC Sit to Stand: Max assist, +2 physical assistance   Step pivot transfers: Max assist, +2 physical assistance       General transfer comment: Pt unable to stand with +2 A and gait belt, needed +2 assist with bed pad support to reach full upright posture in RW and poor RW mgmt to step from bed>chair.    Ambulation/Gait                   Stairs             Wheelchair Mobility    Modified Rankin (Stroke Patients Only)       Balance Overall balance assessment: Needs assistance Sitting-balance support: Feet supported, No upper extremity supported Sitting balance-Leahy Scale: Poor Sitting balance - Comments: pt with multiple posterior LOB today unclear if due to cognition or true trunk weakness needing consistent min guard to minA for seated balance   Standing balance support: Bilateral upper extremity supported, Reliant on assistive device for balance Standing balance-Leahy Scale: Zero Standing balance comment: posterior LOB needing consistent maxA for dynamic standing tasks                            Cognition Arousal/Alertness: Awake/alert Behavior During Therapy: Flat affect Overall Cognitive Status: No family/caregiver present to determine baseline cognitive functioning Area of Impairment: Attention, Following commands, Safety/judgement, Awareness, Problem  solving                 Orientation Level: Disoriented to, Time, Situation Current Attention Level: Sustained Memory: Decreased recall of precautions, Decreased short-term  memory Following Commands: Follows one step commands inconsistently, Follows one step commands with increased time Safety/Judgement: Decreased awareness of safety, Decreased awareness of deficits Awareness: Intellectual Problem Solving: Difficulty sequencing, Requires verbal cues, Requires tactile cues, Slow processing, Decreased initiation General Comments: Pt tangential and seeming to confabulate disparate events (pt having LLE discomfort and notably guarding and states "My foot broke" although nothing in chart re: recent injury). Oriented to self/"hospital", "4th" and "building 5" with cues but otherwise not oriented. Increased time and cues for mobility with poor initiation and effort today.        Exercises      General Comments General comments (skin integrity, edema, etc.): VSS per chart review, no acute s/sx distress other than pt apparent LLE pain and edema in L foot, MD/RN notified.      Pertinent Vitals/Pain Pain Assessment Pain Assessment: Faces Faces Pain Scale: Hurts even more Pain Location: L foot/LE Pain Descriptors / Indicators: Discomfort, Grimacing, Guarding Pain Intervention(s): Limited activity within patient's tolerance, Monitored during session, Repositioned, Patient requesting pain meds-RN notified    Home Living                          Prior Function            PT Goals (current goals can now be found in the care plan section) Acute Rehab PT Goals Patient Stated Goal: To get ice cream and BBQ PT Goal Formulation: Patient unable to participate in goal setting Time For Goal Achievement: 03/16/22 Progress towards PT goals: Progressing toward goals    Frequency    Min 3X/week      PT Plan Current plan remains appropriate;Other (comment)    Co-evaluation PT/OT/SLP Co-Evaluation/Treatment: Yes Reason for Co-Treatment: Complexity of the patient's impairments (multi-system involvement);Necessary to address cognition/behavior during  functional activity;For patient/therapist safety;To address functional/ADL transfers PT goals addressed during session: Mobility/safety with mobility;Balance;Proper use of DME;Strengthening/ROM        AM-PAC PT "6 Clicks" Mobility   Outcome Measure  Help needed turning from your back to your side while in a flat bed without using bedrails?: A Lot Help needed moving from lying on your back to sitting on the side of a flat bed without using bedrails?: Total Help needed moving to and from a bed to a chair (including a wheelchair)?: Total Help needed standing up from a chair using your arms (e.g., wheelchair or bedside chair)?: Total Help needed to walk in hospital room?: Total Help needed climbing 3-5 steps with a railing? : Total 6 Click Score: 7    End of Session Equipment Utilized During Treatment: Gait belt Activity Tolerance: Patient limited by fatigue;Other (comment);Patient limited by pain (LLE guarding/difficulty fully WB) Patient left: in chair;with call bell/phone within reach;with chair alarm set;Other (comment) (posey lap belt chair alarm donned) Nurse Communication: Mobility status;Need for lift equipment;Other (comment) (pt LLE guarding/pain and poor stepping with RW today, recommend Stedy for safety to pivot back to bed with +2 A) PT Visit Diagnosis: Unsteadiness on feet (R26.81);Difficulty in walking, not elsewhere classified (R26.2)     Time: 1610-9604 PT Time Calculation (min) (ACUTE ONLY): 25 min  Charges:  $Therapeutic Activity: 8-22 mins  Houston Siren., PTA Acute Rehabilitation Services Secure Chat Preferred 9a-5:30pm Office: [place] 03/03/2022, 4:29 PM

## 2022-03-03 NOTE — Progress Notes (Signed)
Occupational Therapy Treatment Patient Details Name: Jennifer Pacheco MRN: [id number] DOB: 1956-05-09 Today's Date: 03/03/2022   History of present illness Pt is 66 yo female who was found down in her home and taken to [facility]. 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 Pt with retroperitoneal bleed and IVC filter placed 6/5. PMH: DM2, HTN.   OT comments  Pt making slow progress toward goals this session, requiring max A +2 for bed mobility and transfers, set up A for seated grooming task in chair. Pt with little to no task initiation this session with OOB mobility, and poor activity tolerance. Pt with noted LLE edema, RN and MD notified. Pt presenting with impairments listed below, will follow. Continue to recommend SNF at d/c.   Recommendations for follow up therapy are one component of a multi-disciplinary discharge planning process, led by the attending physician.  Recommendations may be updated based on patient status, additional functional criteria and insurance authorization.    Follow Up Recommendations  Skilled nursing-short term rehab (<3 hours/day)    Assistance Recommended at Discharge Frequent or constant Supervision/Assistance  Patient can return home with the following  A lot of help with walking and/or transfers;A lot of help with bathing/dressing/bathroom;Assistance with cooking/housework;Direct supervision/assist for medications management;Direct supervision/assist for financial management;Assist for transportation;Help with stairs or ramp for entrance   Equipment Recommendations  None recommended by OT;Other (comment) (TBD)    Recommendations for Other Services PT consult    Precautions / Restrictions Precautions Precautions: Fall Precaution Comments: HOH Restrictions Weight Bearing Restrictions: No       Mobility Bed Mobility Overal bed mobility: Needs Assistance Bed Mobility: Supine to Sit     Supine to sit: Max  assist, +2 for physical assistance     General bed mobility comments: pt with no task initation; perseverative in conversation, not attending to task    Transfers Overall transfer level: Needs assistance Equipment used: Rolling walker (2 wheels) Transfers: Sit to/from Stand, Bed to chair/wheelchair/BSC Sit to Stand: Max assist, +2 physical assistance     Step pivot transfers: Max assist, +2 physical assistance     General transfer comment: pt with decreased weight on LLE with transfer, keeping weight shifted toward requiring physical assist to step forward     Balance Overall balance assessment: Needs assistance Sitting-balance support: Feet supported, No upper extremity supported Sitting balance-Leahy Scale: Poor Sitting balance - Comments: posterior lean with max cuing to scoot forward to EOB/feet on floor   Standing balance support: Bilateral upper extremity supported, Reliant on assistive device for balance Standing balance-Leahy Scale: Zero                             ADL either performed or assessed with clinical judgement   ADL Overall ADL's : Needs assistance/impaired     Grooming: Wash/dry face;Sitting;Set up Grooming Details (indicate cue type and reason): in recliner                 Toilet Transfer: Maximal assistance;+2 for physical assistance;Ambulation;BSC/3in1;Rolling walker (2 wheels) Toilet Transfer Details (indicate cue type and reason): step-pivot to chair         Functional mobility during ADLs: Maximal assistance;+2 for physical assistance      Extremity/Trunk Assessment Upper Extremity Assessment Upper Extremity Assessment: Generalized weakness   Lower Extremity Assessment Lower Extremity Assessment: Defer to PT evaluation        Vision  Vision Assessment?: Vision impaired- to be further tested in functional context   Perception Perception Perception: Not tested   Praxis Praxis Praxis: Not tested    Cognition  Arousal/Alertness: Awake/alert Behavior During Therapy: Flat affect Overall Cognitive Status: No family/caregiver present to determine baseline cognitive functioning Area of Impairment: Attention, Following commands, Safety/judgement, Awareness, Problem solving                 Orientation Level: Situation (able to state it is 3:30 and 4th of July holiday) Current Attention Level: Sustained Memory: Decreased recall of precautions, Decreased short-term memory Following Commands: Follows one step commands inconsistently, Follows one step commands with increased time Safety/Judgement: Decreased awareness of safety, Decreased awareness of deficits Awareness: Intellectual Problem Solving: Difficulty sequencing, Requires verbal cues, Requires tactile cues, Slow processing, Decreased initiation General Comments: decreased task initiation and overall effort with all mobility/ADL tasks        Exercises      Shoulder Instructions       General Comments L foot edema noted, RN/MD made aware    Pertinent Vitals/ Pain       Pain Assessment Pain Assessment: Faces Pain Score: 6  Faces Pain Scale: Hurts even more Pain Location: L foot/LE Pain Descriptors / Indicators: Discomfort, Grimacing, Guarding Pain Intervention(s): Limited activity within patient's tolerance, Monitored during session, Repositioned  Home Living                                          Prior Functioning/Environment              Frequency  Min 2X/week        Progress Toward Goals  OT Goals(current goals can now be found in the care plan section)  Progress towards OT goals: Progressing toward goals  Acute Rehab OT Goals Patient Stated Goal: noen stated OT Goal Formulation: With patient Time For Goal Achievement: 03/17/22 Potential to Achieve Goals: Fair ADL Goals Pt Will Perform Grooming: with set-up;sitting Pt Will Perform Upper Body Bathing: with set-up;sitting Pt Will Perform  Lower Body Bathing: with min assist;bed level;sitting/lateral leans Pt Will Perform Upper Body Dressing: with set-up;sitting;bed level Pt Will Perform Lower Body Dressing: with min assist;sit to/from stand;sitting/lateral leans Pt Will Transfer to Toilet: with mod assist;stand pivot transfer;bedside commode Pt Will Perform Toileting - Clothing Manipulation and hygiene: with mod assist;sitting/lateral leans;sit to/from stand Additional ADL Goal #1: Pt will initiate 3/4 tasks during session in prep for independence with ADLs/mobility  Plan Discharge plan remains appropriate    Co-evaluation    PT/OT/SLP Co-Evaluation/Treatment: Yes Reason for Co-Treatment: Complexity of the patient's impairments (multi-system involvement);For patient/therapist safety;To address functional/ADL transfers PT goals addressed during session: Mobility/safety with mobility;Balance;Proper use of DME;Strengthening/ROM OT goals addressed during session: ADL's and self-care      AM-PAC OT "6 Clicks" Daily Activity     Outcome Measure   Help from another person eating meals?: A Little Help from another person taking care of personal grooming?: A Little Help from another person toileting, which includes using toliet, bedpan, or urinal?: A Lot Help from another person bathing (including washing, rinsing, drying)?: A Lot Help from another person to put on and taking off regular upper body clothing?: A Little Help from another person to put on and taking off regular lower body clothing?: A Lot 6 Click Score: 15    End of Session Equipment Utilized During Treatment:  Gait belt;Rolling walker (2 wheels)  OT Visit Diagnosis: Unsteadiness on feet (R26.81);Other abnormalities of gait and mobility (R26.89);History of falling (Z91.81);Other symptoms and signs involving cognitive function   Activity Tolerance Patient tolerated treatment well   Patient Left in chair;with call bell/phone within reach;with chair alarm set    Nurse Communication Mobility status        Time: 0223-3612 OT Time Calculation (min): 25 min  Charges: OT General Charges $OT Visit: 1 Visit OT Treatments $Therapeutic Activity: 8-22 mins  Lynnda Child, OTD, OTR/L Acute Rehab [phone]   Kaylyn Lim 03/03/2022, 5:06 PM

## 2022-03-04 ENCOUNTER — Inpatient Hospital Stay (HOSPITAL_COMMUNITY): Payer: Medicaid Other

## 2022-03-04 DIAGNOSIS — R52 Pain, unspecified: Secondary | ICD-10-CM

## 2022-03-04 DIAGNOSIS — I2609 Other pulmonary embolism with acute cor pulmonale: Secondary | ICD-10-CM | POA: Diagnosis not present

## 2022-03-04 DIAGNOSIS — D62 Acute posthemorrhagic anemia: Secondary | ICD-10-CM | POA: Diagnosis not present

## 2022-03-04 LAB — GLUCOSE, CAPILLARY: Glucose-Capillary: 97 mg/dL (ref 70–99)

## 2022-03-04 NOTE — Progress Notes (Signed)
Bilateral lower extremity venous duplex has been completed. Preliminary results can be found in CV Proc through chart review.  Results were given to the patient's nurse, [place].  03/04/22 1:21 PM Carlos Levering RVT

## 2022-03-04 NOTE — Progress Notes (Signed)
CSW spoke with Corene Cornea at [place] [facility] who states he will send the Webex invite for court hearing that is scheduled for Friday, 03/06/22.  CSW notified Jed Limerick of [employer] of information.  Madilyn Fireman, MSW, LCSW Transitions of Care  Clinical Social Worker II [phone]
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
## 2022-03-05 DIAGNOSIS — G9389 Other specified disorders of brain: Secondary | ICD-10-CM | POA: Diagnosis not present

## 2022-03-05 DIAGNOSIS — D62 Acute posthemorrhagic anemia: Secondary | ICD-10-CM | POA: Diagnosis not present

## 2022-03-05 DIAGNOSIS — I82402 Acute embolism and thrombosis of unspecified deep veins of left lower extremity: Secondary | ICD-10-CM | POA: Diagnosis not present

## 2022-03-05 DIAGNOSIS — E119 Type 2 diabetes mellitus without complications: Secondary | ICD-10-CM | POA: Diagnosis not present

## 2022-03-05 DIAGNOSIS — I2699 Other pulmonary embolism without acute cor pulmonale: Secondary | ICD-10-CM | POA: Diagnosis not present

## 2022-03-05 DIAGNOSIS — I2609 Other pulmonary embolism with acute cor pulmonale: Secondary | ICD-10-CM | POA: Diagnosis not present

## 2022-03-05 LAB — CBC
HCT: 40.1 % (ref 36.0–46.0)
HCT: 41.1 % (ref 36.0–46.0)
Hemoglobin: 13 g/dL (ref 12.0–15.0)
Hemoglobin: 13.4 g/dL (ref 12.0–15.0)
MCH: 28.6 pg (ref 26.0–34.0)
MCH: 28.8 pg (ref 26.0–34.0)
MCHC: 32.4 g/dL (ref 30.0–36.0)
MCHC: 32.6 g/dL (ref 30.0–36.0)
MCV: 88.1 fL (ref 80.0–100.0)
MCV: 88.2 fL (ref 80.0–100.0)
Platelets: 236 10*3/uL (ref 150–400)
Platelets: 247 10*3/uL (ref 150–400)
RBC: 4.55 MIL/uL (ref 3.87–5.11)
RBC: 4.66 MIL/uL (ref 3.87–5.11)
RDW: 20 % — ABNORMAL HIGH (ref 11.5–15.5)
RDW: 19.9 % — ABNORMAL HIGH (ref 11.5–15.5)
WBC: 11.6 10*3/uL — ABNORMAL HIGH (ref 4.0–10.5)
WBC: 10.6 10*3/uL — ABNORMAL HIGH (ref 4.0–10.5)
nRBC: 0 % (ref 0.0–0.2)
nRBC: 0 % (ref 0.0–0.2)

## 2022-03-05 LAB — HEPARIN LEVEL (UNFRACTIONATED): Heparin Unfractionated: 0.67 IU/mL (ref 0.30–0.70)

## 2022-03-05 MED ORDER — DEXAMETHASONE 6 MG PO TABS
3.0000 mg | ORAL_TABLET | Freq: Every day | ORAL | Status: DC
  Administered 2022-03-06 – 2022-03-24 (×19): 3 mg via ORAL
  Filled 2022-03-05 (×12): qty 0.5
  Filled 2022-03-05: qty 2
  Filled 2022-03-05 (×7): qty 0.5

## 2022-03-05 MED ORDER — HEPARIN (PORCINE) 25000 UT/250ML-% IV SOLN
750.0000 [IU]/h | INTRAVENOUS | Status: AC
  Administered 2022-03-05: 1000 [IU]/h via INTRAVENOUS
  Administered 2022-03-06 – 2022-03-08 (×2): 750 [IU]/h via INTRAVENOUS
  Filled 2022-03-05 (×3): qty 250

## 2022-03-05 NOTE — Progress Notes (Signed)
CSW spoke with Jed Limerick of [employer] [employer] to discuss patient's upcoming court hearing further.  Madilyn Fireman, MSW, LCSW Transitions of Care  Clinical Social Worker II [phone]

## 2022-03-05 NOTE — Progress Notes (Signed)
Occupational Therapy Treatment Patient Details Name: Jennifer Pacheco MRN: [id number] DOB: 05-27-1956 Today's Date: 03/05/2022   History of present illness Pt is 66 yo female who was found down in her home and taken to [facility]. 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 Pt with retroperitoneal bleed and IVC filter placed 6/5. PMH: DM2, HTN.   OT comments  Patient received in supine and asking to use BSC. After moving blankets it was discovered that patient had already had a BM.  COTA attempted to assist patient with cleaning with rolling side to side but patient had increased difficulty maintaining side lying position. Nursing tech assisted with patient cleaning and mod assist for rolling due to pain at LLE when moved.  Patient remained in supine following end of session due to difficulty with mobility due to LLE pain. Acute OT to continue to follow.    Recommendations for follow up therapy are one component of a multi-disciplinary discharge planning process, led by the attending physician.  Recommendations may be updated based on patient status, additional functional criteria and insurance authorization.    Follow Up Recommendations  Skilled nursing-short term rehab (<3 hours/day)    Assistance Recommended at Discharge Frequent or constant Supervision/Assistance  Patient can return home with the following  A lot of help with walking and/or transfers;A lot of help with bathing/dressing/bathroom;Assistance with cooking/housework;Direct supervision/assist for medications management;Direct supervision/assist for financial management;Assist for transportation;Help with stairs or ramp for entrance   Equipment Recommendations  None recommended by OT;Other (comment)    Recommendations for Other Services      Precautions / Restrictions Precautions Precautions: Fall Precaution Comments: HOH Restrictions Weight Bearing Restrictions: No       Mobility  Bed Mobility Overal bed mobility: Needs Assistance Bed Mobility: Rolling Rolling: Mod assist         General bed mobility comments: mod assist to roll side to side to address cleaning due to pain at LLE    Transfers Overall transfer level: Needs assistance                 General transfer comment: transfer deferred due to LLE pain and edema     Balance Overall balance assessment: Needs assistance Sitting-balance support: Feet supported, No upper extremity supported Sitting balance-Leahy Scale: Poor                                     ADL either performed or assessed with clinical judgement   ADL Overall ADL's : Needs assistance/impaired     Grooming: Wash/dry hands;Wash/dry face;Set up;Bed level       Lower Body Bathing: Maximal assistance;Bed level Lower Body Bathing Details (indicate cue type and reason): patient required mod assist to roll and max assist for LB bathing Upper Body Dressing : Minimal assistance;Bed level Upper Body Dressing Details (indicate cue type and reason): to donn gown                   General ADL Comments: patient had a BM in bed and was seen to address cleaning    Extremity/Trunk Assessment              Vision       Perception     Praxis      Cognition Arousal/Alertness: Awake/alert Behavior During Therapy: Flat affect Overall Cognitive Status: No family/caregiver present to determine baseline cognitive  functioning Area of Impairment: Attention, Following commands, Safety/judgement, Awareness, Problem solving                   Current Attention Level: Sustained Memory: Decreased recall of precautions, Decreased short-term memory Following Commands: Follows one step commands inconsistently, Follows one step commands with increased time Safety/Judgement: Decreased awareness of safety, Decreased awareness of deficits Awareness: Intellectual Problem Solving: Difficulty sequencing, Requires  verbal cues, Requires tactile cues, Slow processing, Decreased initiation General Comments: required frequent cueing to stay on task and participate. perseverated on phone and blue bag        Exercises      Shoulder Instructions       General Comments      Pertinent Vitals/ Pain       Pain Assessment Pain Assessment: Faces Faces Pain Scale: Hurts even more Pain Location: L foot/LE Pain Descriptors / Indicators: Discomfort, Grimacing, Guarding Pain Intervention(s): Limited activity within patient's tolerance, Monitored during session, Repositioned  Home Living                                          Prior Functioning/Environment              Frequency  Min 2X/week        Progress Toward Goals  OT Goals(current goals can now be found in the care plan section)  Progress towards OT goals: Progressing toward goals  Acute Rehab OT Goals Patient Stated Goal: get better OT Goal Formulation: With patient Time For Goal Achievement: 03/17/22 Potential to Achieve Goals: Fair ADL Goals Pt Will Perform Grooming: with set-up;sitting Pt Will Perform Upper Body Bathing: with set-up;sitting Pt Will Perform Lower Body Bathing: with min assist;bed level;sitting/lateral leans Pt Will Perform Upper Body Dressing: with set-up;sitting;bed level Pt Will Perform Lower Body Dressing: with min assist;sit to/from stand;sitting/lateral leans Pt Will Transfer to Toilet: with mod assist;stand pivot transfer;bedside commode Pt Will Perform Toileting - Clothing Manipulation and hygiene: with mod assist;sitting/lateral leans;sit to/from stand Additional ADL Goal #1: Pt will initiate 3/4 tasks during session in prep for independence with ADLs/mobility  Plan Discharge plan remains appropriate    Co-evaluation                 AM-PAC OT "6 Clicks" Daily Activity     Outcome Measure   Help from another person eating meals?: A Little Help from another person taking  care of personal grooming?: A Little Help from another person toileting, which includes using toliet, bedpan, or urinal?: A Lot Help from another person bathing (including washing, rinsing, drying)?: A Lot Help from another person to put on and taking off regular upper body clothing?: A Little Help from another person to put on and taking off regular lower body clothing?: A Lot 6 Click Score: 15    End of Session    OT Visit Diagnosis: Unsteadiness on feet (R26.81);Other abnormalities of gait and mobility (R26.89);History of falling (Z91.81);Other symptoms and signs involving cognitive function   Activity Tolerance Patient limited by pain   Patient Left in bed;with call bell/phone within reach;with bed alarm set;with nursing/sitter in room   Nurse Communication Mobility status        Time: 1601-0932 OT Time Calculation (min): 35 min  Charges: OT General Charges $OT Visit: 1 Visit OT Treatments $Self Care/Home Management : 23-37 mins  Lodema Hong, [place]  Office [phone]   Trixie Dredge 03/05/2022, 12:43 PM

## 2022-03-05 NOTE — Consult Note (Signed)
[facility] [place]  ASSESSMENT / PLAN: 66 y.o. female with acute LLE iliofemoral and femoropopliteal and tibial deep venous thrombosis.  She has had a difficult hospital course.  She was initially admitted for pulmonary embolism with cor pulmonale.  She was not able to tolerate anticoagulation initially and developed a retroperitoneal hematoma.  An IVC filter was placed by interventional radiology.  She now has acute DVT.  I discussed with her primary physician.  She is not a good candidate for intervention.  I would recommend slowly initiating anticoagulation with heparin to try to treat this clot.  She is protected from embolization by her IVC filter.  This should remain in place.  Recommend elevating and compressing the left lower extremity for symptomatic relief.  CHIEF COMPLAINT: Left leg DVT  HISTORY OF PRESENT ILLNESS: Jennifer Pacheco is a 66 y.o. female admitted to the internal medicine service for pulmonary embolism.  The patient has had a long and difficult hospital course, being admitted since 01/30/2022.  She was found down and brought to the ER found to have a mass in her brain and a large PE.  Anticoagulation was initiated, but she developed the retroperitoneal hematoma.  Interventional radiology placed an IVC filter 02/02/2022.  Over the past several days patient is noticing worsening pain and swelling in the left lower extremity.  Venous duplex was performed.  This was positive for acute DVT.  Vascular surgery was consulted.   Past Medical History:  Diagnosis Date   Fall    Hard of hearing     Past Surgical History:  Procedure Laterality Date   BREAST BIOPSY Left 2020   beg   IR IVC FILTER PLMT / S&I /IMG GUID/MOD SED  02/02/2022    Family History  Problem Relation Age of Onset   Breast cancer Maternal Aunt     Social History   Socioeconomic History   Marital status: Single    Spouse name: Not on file   Number of children: Not on file    Years of education: Not on file   Highest education level: Not on file  Occupational History   Not on file  Tobacco Use   Smoking status: Never   Smokeless tobacco: Never  Substance and Sexual Activity   Alcohol use: Not Currently   Drug use: Yes    Types: Marijuana   Sexual activity: Yes    Partners: Male  Other Topics Concern   Not on file  Social History Narrative   Not on file   Social Determinants of Health   Financial Resource Strain: Not on file  Food Insecurity: Not on file  Transportation Needs: Not on file  Physical Activity: Not on file  Stress: Not on file  Social Connections: Not on file  Intimate Partner Violence: Not on file    Allergies  Allergen Reactions   Penicillins Anaphylaxis    Current Facility-Administered Medications  Medication Dose Route Frequency Provider Last Rate Last Admin   0.9 %  sodium chloride infusion  250 mL Intravenous Continuous Mercy Riding, MD   Stopped at 02/02/22 0929   acetaminophen (TYLENOL) tablet 650 mg  650 mg Oral Q4H PRN Wendee Beavers T, MD   650 mg at 03/05/22 1650   calcium carbonate (TUMS - dosed in mg elemental calcium) chewable tablet 200 mg of elemental calcium  1 tablet Oral TID PRN Wendee Beavers T, MD   200 mg of elemental calcium at 03/03/22 1248   [START ON  03/06/2022] dexamethasone (DECADRON) tablet 3 mg  3 mg Oral Daily Ghimire, Henreitta Leber, MD       docusate sodium (COLACE) capsule 100 mg  100 mg Oral BID Wendee Beavers T, MD   100 mg at 03/05/22 4008   gabapentin (NEURONTIN) tablet 300 mg  300 mg Oral BID Hosie Poisson, MD   300 mg at 03/05/22 0812   heparin ADULT infusion 100 units/mL (25000 units/263m)  1,000 Units/hr Intravenous Continuous PJoselyn GlassmanA, RPH 10 mL/hr at 03/05/22 1415 1,000 Units/hr at 03/05/22 1415   iohexol (OMNIPAQUE) 300 MG/ML solution 100 mL  100 mL Intravenous Once PRN GMercy Riding MD       ondansetron (ZOFRAN) injection 4 mg  4 mg Intravenous Q6H PRN GWendee BeaversT, MD   4 mg at 02/12/22  0418   pantoprazole (PROTONIX) EC tablet 40 mg  40 mg Oral BID GWendee BeaversT, MD   40 mg at 03/05/22 0812   polyethylene glycol (MIRALAX / GLYCOLAX) packet 17 g  17 g Oral BID GWendee BeaversT, MD   17 g at 03/04/22 0904   senna (SENOKOT) tablet 8.6 mg  1 tablet Oral QHS GWendee BeaversT, MD   8.6 mg at 03/03/22 2126    PHYSICAL EXAM Vitals:   03/04/22 1619 03/04/22 2041 03/05/22 0520 03/05/22 0938  BP: 101/70 110/70 131/76 104/77  Pulse: 70 83 67 80  Resp: '16 18 18 19  '$ Temp: 98.9 F (37.2 C) 98.9 F (37.2 C) (!) 97.5 F (36.4 C) 98.4 F (36.9 C)  TempSrc: Oral Oral Oral Oral  SpO2: 97% 96% 99% 100%  Weight:      Height:        Constitutional: Chronically ill-appearing woman in no acute distress Cardiac: regular rate and rhythm.  Respiratory:  unlabored. Abdominal:  soft, non-tender, non-distended.  Peripheral vascular: 2+ left dorsalis pedis pulse.  2+ edema about the left foot, ankle, pretibial skin.  PERTINENT LABORATORY AND RADIOLOGIC DATA  Most recent CBC    Latest Ref Rng & Units 03/05/2022    2:01 PM 02/26/2022    6:23 AM 02/23/2022    8:41 AM  CBC  WBC 4.0 - 10.5 K/uL 11.6  7.7  7.6   Hemoglobin 12.0 - 15.0 g/dL 13.0  11.9  12.5   Hematocrit 36.0 - 46.0 % 40.1  36.1  38.1   Platelets 150 - 400 K/uL 236  258  412      Most recent CMP    Latest Ref Rng & Units 03/02/2022    7:54 AM 02/26/2022    6:23 AM 02/23/2022    8:41 AM  CMP  Glucose 70 - 99 mg/dL  89  159   BUN 8 - 23 mg/dL  14  14   Creatinine 0.44 - 1.00 mg/dL 0.75  0.71  0.72   Sodium 135 - 145 mmol/L  137  131   Potassium 3.5 - 5.1 mmol/L  4.3  3.4   Chloride 98 - 111 mmol/L  100  96   CO2 22 - 32 mmol/L  26  26   Calcium 8.9 - 10.3 mg/dL  9.0  9.0     Renal function Estimated Creatinine Clearance: 68.8 mL/min (by C-G formula based on SCr of 0.75 mg/dL).  Hgb A1c MFr Bld (%)  Date Value  01/30/2022 5.5   Venous duplex 03/04/2022 Summary:  RIGHT:  - There is no evidence of deep vein thrombosis in  the lower extremity.     -  No cystic structure found in the popliteal fossa.     LEFT:  - Findings consistent with acute deep vein thrombosis involving the left  common iliac vein, left external iliac vein, left common femoral vein,  left femoral vein, left proximal profunda vein, left popliteal vein, left  posterior tibial veins, left  peroneal veins, and left gastrocnemius veins.  - No cystic structure found in the popliteal fossa.   Yevonne Aline. Stanford Breed, MD [facility] Phone Number: [phone] 03/05/2022 5:12 PM  Total time spent on preparing this encounter including chart review, data review, collecting history, examining the patient, coordinating care for this new patient, 60 minutes.  Portions of this report may have been transcribed using voice recognition software.  Every effort has been made to ensure accuracy; however, inadvertent computerized transcription errors may still be present.

## 2022-03-05 NOTE — Progress Notes (Signed)
ANTICOAGULATION CONSULT NOTE - Initial Consult  Pharmacy Consult for Heparin Indication: pulmonary embolus  Allergies  Allergen Reactions   Penicillins Anaphylaxis    Patient Measurements: Height: '5\' 5"'$  (165.1 cm) Weight: 70.1 kg (154 lb 8.7 oz) IBW/kg (Calculated) : 57 Heparin Dosing Weight: 70  Vital Signs: Temp: 98.4 F (36.9 C) (07/06 0938) Temp Source: Oral (07/06 0938) BP: 104/77 (07/06 0938) Pulse Rate: 80 (07/06 0938)  Labs: No results for input(s): "HGB", "HCT", "PLT", "APTT", "LABPROT", "INR", "HEPARINUNFRC", "HEPRLOWMOCWT", "CREATININE", "CKTOTAL", "CKMB", "TROPONINIHS" in the last 72 hours.  Estimated Creatinine Clearance: 68.8 mL/min (by C-G formula based on SCr of 0.75 mg/dL).   Medical History: Past Medical History:  Diagnosis Date   Fall    Hard of hearing     Assessment: 66 yo female admitted over a month ago with submassive PE. Previously anticoagulated with heparin, but stopped due to retroperitoneal bleed. Has been on VTE prophylactic lovenox since 02/09/22. D/W Dr. Sloan Leiter who wants to re-challenge patient with therapeutic heparin gently and monitor for rebleed. No bolus and aim for low therapeutic end of range.    Goal of Therapy:  Heparin level 0.3-0.5 Monitor platelets by anticoagulation protocol: Yes   Plan:  D/C lovenox Start heparin at 1000 units/hr Heparin level in 6 hours CBC q12h for now per MD Daily heparin level Monitor for bleeding  Siyana Erney A. Levada Dy, PharmD, BCPS, FNKF Clinical Pharmacist [place] Please utilize Amion for appropriate phone number to reach the unit pharmacist ([place])  03/05/2022,1:37 PM

## 2022-03-05 NOTE — Progress Notes (Addendum)
PROGRESS NOTE        PATIENT DETAILS Name: Jennifer Pacheco Age: 66 y.o. Sex: female Date of Birth: 07/29/1956 Admit Date: 01/30/2022 Admitting Physician Collier Bullock, MD AST:[id number], Brigitte Pulse, NP  Brief Summary: Patient is a 66 y.o.  female with history of DM-2, HTN who presented to [facility] ED on 6/2 after she was found confused/down in feces/urine-she was hypotensive/hypoxic-she was found to have a submassive PE-CT head showed right basal ganglia mass with mass effect-she was transferred to [place] she was monitored and started on anticoagulation.  Unfortunately further hospital course was complicated by development of a retroperitoneal bleed and acute blood loss anemia.  Heparin was discontinued-IVC filter was placed-and upon further stability-patient was transferred to [facility].   Significant studies: 6/2>> CTA chest: PE with CT evidence of RV strain. 6/2>> CT abdomen/pelvis: Cholelithiasis, CT findings of left pyelonephritis, esophageal/patulous/thickened distally 6/2>> CT head: Large hypodense/irregular mass in the right basal ganglia/right insula-with surrounding edema. 6/3>> RUQ ultrasound: Cholelithiasis/mild pericholecystic fluid/mild gallbladder wall thickening. 6/3>> MRI brain no contrast: 5.2 x 5.3 cm mass on the right-with mass effect and vasogenic edema 6/3>> Echo: EF 29-79%, RV systolic function normal 8/9>> CT angio GI bleed: No areas of active extravasation-large left-sided retroperitoneal hematoma. 6/14>> MRI brain with/without contrast: 5.9 x 5.4 x 5.9 enhancing mass in the right frontotemporal/periinsular region-favored extra-axial in origin and favored to reflect a meningioma. 6/23>> CT head: Large meningioma involving the right frontotemporal region with associated vasogenic edema/regional mass effect 7/5>> acute DVT involving left common iliac, external iliac, common femoral, left femoral, left proximal profunda, left popliteal, left  posterior tibial, left peroneal, left gastrocnemius   Procedures: 6/5>> IVC filter by IR  Consults: PCCM, neurosurgery, GI, IR, palliative care, vascular surgery  Subjective: No chest pain or shortness of breath.  Acknowledges worsening swelling of the left lower extremity over the past few days.  Objective: Vitals: Blood pressure 104/77, pulse 80, temperature 98.4 F (36.9 C), temperature source Oral, resp. rate 19, height '5\' 5"'$  (1.651 m), weight 70.1 kg, SpO2 100 %.   Exam: Gen Exam:Alert awake-not in any distress HEENT:atraumatic, normocephalic Chest: B/L clear to auscultation anteriorly CVS:S1S2 regular Abdomen:soft non tender, non distended Extremities: LLE swelling. Neurology: Mild left-sided weakness Skin: no rash   Pertinent Labs/Radiology:    Latest Ref Rng & Units 02/26/2022    6:23 AM 02/23/2022    8:41 AM 02/17/2022    4:40 AM  CBC  WBC 4.0 - 10.5 K/uL 7.7  7.6  9.6   Hemoglobin 12.0 - 15.0 g/dL 11.9  12.5  11.4   Hematocrit 36.0 - 46.0 % 36.1  38.1  34.2   Platelets 150 - 400 K/uL 258  412  334     Lab Results  Component Value Date   NA 137 02/26/2022   K 4.3 02/26/2022   CL 100 02/26/2022   CO2 26 02/26/2022      Assessment/Plan: Submassive PE with acute cor pulmonale: Did not tolerate anticoagulation and developed a retroperitoneal bleed-has IVC filter in place.  LLE DVT: Significant clot burden in LLE on Doppler ultrasound on 7/5-difficult situation-has IVC filter in place-discussed with vascular surgeon-Dr. Rushie Nyhan prior history of retroperitoneal bleed/other issues-he does not think patient would be a good candidate for thrombectomy-advises that we consider rechallenge with IV heparin-we will ask pharmacy to dose-no bolus-maintain at low therapeutic index-check CBC  every 12.  This MD spoke with social work-no family wants to make decisions for this patient-apparently there is a close friend-[phone]-that apparently is somewhat involved in  this patient's care-I called and have left a voicemail.  But I suspect at this point-it is reasonable to attempt to rechallenge with heparin and see how she does.  Acute blood loss anemia due to retroperitoneal bleed: Hemoglobin now stable-no signs of recurrent bleeding.  Acute metabolic encephalopathy: Multifactorial-due to acute illness/brain mass-suspicion for some underlying cognitive issue (per prior note-estranged from family-does strange things-fails to recognize her brother at times).  Awake/alert this morning.  Apparently has some impaired hearing which could contribute.  Large right basal ganglia mass with surrounding edema/11 mm midline shift: Continues to have left hemiparesis-on steroids-Per prior notes-now thought to be a meningioma-neurosurgery recommending outpatient follow-up in 2-3 months for reevaluation/craniotomy.  Spoke with neurosurgeon-Dr. Dawley on 7/6-no contraindication for anticoagulation-suggest that we slowly taper off Decadron in 2 weeks.  Multifactorial shock-due to hypovolemia/submassive PE/septic shock (pyelonephritis): BP stable-hemoglobin stable-no longer on antibiotics for  Acute hypoxic respiratory failure: Resolved-due to PE-on room air.  Distal esophageal thickening: Noted on CT-evaluated by GI-recommendations are for PPI-EGD deferred for now-could be possibly done in the outpatient setting.  DM-2 (A1c 5.5 on 6/2): CBG stable-hold metformin for now.  Recent Labs    03/02/22 1552 03/04/22 0800  GLUCAP 230* 97     Deconditioning: Evaluated by therapy-recommendations are for SNF.  Other issues: APS involved-social work following-guardianship process underway.  Discussed with social work Arbie Cookey on 7/6-no family member wants to make any decisions-guardianship hearing scheduled for tomorrow.   Pressure Ulcer: Pressure Injury 01/30/22 Sacrum Right;Medial Stage 2 -  Partial thickness loss of dermis presenting as a shallow open injury with a red, pink wound  bed without slough. (Active)  01/30/22 2053  Location: Sacrum  Location Orientation: Right;Medial  Staging: Stage 2 -  Partial thickness loss of dermis presenting as a shallow open injury with a red, pink wound bed without slough.  Wound Description (Comments):   Present on Admission: Yes  Dressing Type Foam - Lift dressing to assess site every shift 03/04/22 0900     Pressure Injury 01/30/22 Coccyx Lower;Medial Stage 2 -  Partial thickness loss of dermis presenting as a shallow open injury with a red, pink wound bed without slough. (Active)  01/30/22 2053  Location: Coccyx  Location Orientation: Lower;Medial  Staging: Stage 2 -  Partial thickness loss of dermis presenting as a shallow open injury with a red, pink wound bed without slough.  Wound Description (Comments):   Present on Admission: Yes  Dressing Type Foam - Lift dressing to assess site every shift 03/02/22 0825    BMI: Estimated body mass index is 25.72 kg/m as calculated from the following:   Height as of this encounter: '5\' 5"'$  (1.651 m).   Weight as of this encounter: 70.1 kg.   Code status:   Code Status: Full Code   DVT Prophylaxis: enoxaparin (LOVENOX) injection 40 mg Start: 02/09/22 1800 Place and maintain sequential compression device Start: 02/02/22 1450    Family Communication: None at [employer] for patient's [place].  Per social work-no family member wants to make decisions.   Disposition Plan: Status is: Inpatient Remains inpatient appropriate because: Awaiting guardianship process-SNF placement.   Planned Discharge Destination:Skilled nursing facility   Diet: Diet Order             DIET DYS 3 Room service appropriate? Yes; Fluid consistency: Thin  Diet effective now                     Antimicrobial agents: Anti-infectives (From admission, onward)    Start     Dose/Rate Route Frequency Ordered Stop   02/03/22 1715  levofloxacin (LEVAQUIN) tablet 500 mg   Status:  Discontinued        500 mg Oral Daily 02/03/22 1627 02/05/22 1355   02/02/22 2045  levofloxacin (LEVAQUIN) IVPB 500 mg  Status:  Discontinued       Note to Pharmacy: Adjust as needed - switching to IV since unable to take PO   500 mg 100 mL/hr over 60 Minutes Intravenous Every 24 hours 02/02/22 1959 02/03/22 1627   02/01/22 1400  levofloxacin (LEVAQUIN) tablet 500 mg  Status:  Discontinued        500 mg Oral Daily 02/01/22 0911 02/02/22 1959   01/31/22 1000  vancomycin (VANCOREADY) IVPB 750 mg/150 mL  Status:  Discontinued        750 mg 150 mL/hr over 60 Minutes Intravenous Every 12 hours 01/31/22 0003 01/31/22 0848   01/31/22 0200  vancomycin (VANCOREADY) IVPB 1500 mg/300 mL  Status:  Discontinued        1,500 mg 150 mL/hr over 120 Minutes Intravenous Every 12 hours 01/30/22 1241 01/30/22 2348   01/30/22 2100  aztreonam (AZACTAM) 2 g in sodium chloride 0.9 % 100 mL IVPB  Status:  Discontinued        2 g 200 mL/hr over 30 Minutes Intravenous Every 8 hours 01/30/22 1240 02/01/22 0923   01/30/22 1245  aztreonam (AZACTAM) 2 g in sodium chloride 0.9 % 100 mL IVPB        2 g 200 mL/hr over 30 Minutes Intravenous  Once 01/30/22 1231 01/30/22 1408   01/30/22 1245  metroNIDAZOLE (FLAGYL) IVPB 500 mg        500 mg 100 mL/hr over 60 Minutes Intravenous  Once 01/30/22 1231 01/30/22 1408   01/30/22 1245  vancomycin (VANCOCIN) IVPB 1000 mg/200 mL premix  Status:  Discontinued        1,000 mg 200 mL/hr over 60 Minutes Intravenous  Once 01/30/22 1231 01/30/22 1239   01/30/22 1245  vancomycin (VANCOREADY) IVPB 2000 mg/400 mL        2,000 mg 200 mL/hr over 120 Minutes Intravenous  Once 01/30/22 1239 01/30/22 1607        MEDICATIONS: Scheduled Meds:  dexamethasone  4 mg Oral Daily   docusate sodium  100 mg Oral BID   enoxaparin (LOVENOX) injection  40 mg Subcutaneous Q24H   gabapentin  300 mg Oral BID   metFORMIN  500 mg Oral Q breakfast   pantoprazole  40 mg Oral BID   polyethylene  glycol  17 g Oral BID   senna  1 tablet Oral QHS   Continuous Infusions:  sodium chloride Stopped (02/02/22 0929)   PRN Meds:.acetaminophen, calcium carbonate, iohexol, ondansetron (ZOFRAN) IV   I have personally reviewed following labs and imaging studies  LABORATORY DATA: CBC: No results for input(s): "WBC", "NEUTROABS", "HGB", "HCT", "MCV", "PLT" in the last 168 hours.   Basic Metabolic Panel: Recent Labs  Lab 03/02/22 0754  CREATININE 0.75     GFR: Estimated Creatinine Clearance: 68.8 mL/min (by C-G formula based on SCr of 0.75 mg/dL).  Liver Function Tests: No results for input(s): "AST", "ALT", "ALKPHOS", "BILITOT", "PROT", "ALBUMIN" in the last 168 hours. No results for input(s): "LIPASE", "AMYLASE" in the last 168 hours.  No results for input(s): "AMMONIA" in the last 168 hours.  Coagulation Profile: No results for input(s): "INR", "PROTIME" in the last 168 hours.  Cardiac Enzymes: No results for input(s): "CKTOTAL", "CKMB", "CKMBINDEX", "TROPONINI" in the last 168 hours.  BNP (last 3 results) No results for input(s): "PROBNP" in the last 8760 hours.  Lipid Profile: No results for input(s): "CHOL", "HDL", "LDLCALC", "TRIG", "CHOLHDL", "LDLDIRECT" in the last 72 hours.  Thyroid Function Tests: No results for input(s): "TSH", "T4TOTAL", "FREET4", "T3FREE", "THYROIDAB" in the last 72 hours.  Anemia Panel: No results for input(s): "VITAMINB12", "FOLATE", "FERRITIN", "TIBC", "IRON", "RETICCTPCT" in the last 72 hours.  Urine analysis:    Component Value Date/Time   COLORURINE YELLOW 01/30/2022 1720   APPEARANCEUR CLEAR 01/30/2022 1720   LABSPEC 1.044 (H) 01/30/2022 1720   PHURINE 5.0 01/30/2022 1720   GLUCOSEU NEGATIVE 01/30/2022 1720   HGBUR SMALL (A) 01/30/2022 1720   BILIRUBINUR NEGATIVE 01/30/2022 1720   KETONESUR 20 (A) 01/30/2022 1720   PROTEINUR 30 (A) 01/30/2022 1720   NITRITE POSITIVE (A) 01/30/2022 1720   LEUKOCYTESUR SMALL (A) 01/30/2022 1720     Sepsis Labs: Lactic Acid, Venous    Component Value Date/Time   LATICACIDVEN 1.7 02/03/2022 2101    MICROBIOLOGY: No results found for this or any previous visit (from the past 240 hour(s)).  RADIOLOGY STUDIES/RESULTS: VAS [place] LOWER EXTREMITY VENOUS (DVT)  Result Date: 03/04/2022  Lower Venous DVT Study Patient Name:  EDWARD TREVINO [facility]  Date of Exam:   03/04/2022 Medical Rec #: [id number]               Accession #:    [id number] Date of Birth: 1956/01/15                Patient Gender: F Patient Age:   89 years Exam Location:  [facility] Procedure:      VAS [place] LOWER EXTREMITY VENOUS (DVT) Referring Phys: Inda Merlin --------------------------------------------------------------------------------  Indications: Pain.  Risk Factors: IVC filter. Limitations: Poor ultrasound/tissue interface and patient positioning. Comparison Study: No prior studies. Performing Technologist: Oliver Hum RVT  Examination Guidelines: A complete evaluation includes B-mode imaging, spectral Doppler, color Doppler, and power Doppler as needed of all accessible portions of each vessel. Bilateral testing is considered an integral part of a complete examination. Limited examinations for reoccurring indications may be performed as noted. The reflux portion of the exam is performed with the patient in reverse Trendelenburg.  +---------+---------------+---------+-----------+----------+--------------+ RIGHT    CompressibilityPhasicitySpontaneityPropertiesThrombus Aging +---------+---------------+---------+-----------+----------+--------------+ CFV      Full           Yes      Yes                                 +---------+---------------+---------+-----------+----------+--------------+ SFJ      Full                                                        +---------+---------------+---------+-----------+----------+--------------+ FV Prox  Full                                                         +---------+---------------+---------+-----------+----------+--------------+  FV Mid   Full                                                        +---------+---------------+---------+-----------+----------+--------------+ FV DistalFull                                                        +---------+---------------+---------+-----------+----------+--------------+ PFV      Full                                                        +---------+---------------+---------+-----------+----------+--------------+ POP      Full           Yes      Yes                                 +---------+---------------+---------+-----------+----------+--------------+ PTV      Full                                                        +---------+---------------+---------+-----------+----------+--------------+ PERO     Full                                                        +---------+---------------+---------+-----------+----------+--------------+   +---------+---------------+---------+-----------+----------+--------------+ LEFT     CompressibilityPhasicitySpontaneityPropertiesThrombus Aging +---------+---------------+---------+-----------+----------+--------------+ CFV      Partial        No       No                   Acute          +---------+---------------+---------+-----------+----------+--------------+ SFJ      Full                                                        +---------+---------------+---------+-----------+----------+--------------+ FV Prox  None           No       No                   Acute          +---------+---------------+---------+-----------+----------+--------------+ FV Mid   None           No       No                   Acute          +---------+---------------+---------+-----------+----------+--------------+ FV DistalNone  No       No                   Acute           +---------+---------------+---------+-----------+----------+--------------+ PFV      None           No       No                   Acute          +---------+---------------+---------+-----------+----------+--------------+ POP      None           No       No                   Acute          +---------+---------------+---------+-----------+----------+--------------+ PTV      Partial                                      Acute          +---------+---------------+---------+-----------+----------+--------------+ PERO     Partial                                      Acute          +---------+---------------+---------+-----------+----------+--------------+ Gastroc  Partial                                      Acute          +---------+---------------+---------+-----------+----------+--------------+ EIV                     No       No                   Acute          +---------+---------------+---------+-----------+----------+--------------+ CIV      None           No       No                   Acute          +---------+---------------+---------+-----------+----------+--------------+ Unable to visualize the distal IVC.    Summary: RIGHT: - There is no evidence of deep vein thrombosis in the lower extremity.  - No cystic structure found in the popliteal fossa.  LEFT: - Findings consistent with acute deep vein thrombosis involving the left common iliac vein, left external iliac vein, left common femoral vein, left femoral vein, left proximal profunda vein, left popliteal vein, left posterior tibial veins, left peroneal veins, and left gastrocnemius veins. - No cystic structure found in the popliteal fossa.  *See table(s) above for measurements and observations. Electronically signed by Harold Barban MD on 03/04/2022 at 6:01:09 PM.    Final      LOS: 39 days   Oren Binet, MD  [facility]    To contact the attending provider between 7A-7P or the covering provider  during after hours 7P-7A, please log into the web site [email or web address] and access using universal [place] password for that web site. If you do not have the password, please call the hospital operator.  03/05/2022, 1:14 PM

## 2022-03-05 NOTE — Progress Notes (Signed)
ANTICOAGULATION CONSULT NOTE - Follow-Up Consult  Pharmacy Consult for Heparin Indication: pulmonary embolus  Allergies  Allergen Reactions   Penicillins Anaphylaxis    Patient Measurements: Height: '5\' 5"'$  (165.1 cm) Weight: 70.1 kg (154 lb 8.7 oz) IBW/kg (Calculated) : 57 Heparin Dosing Weight: 70 kg  Vital Signs: Temp: 99 F (37.2 C) (07/06 2102) Temp Source: Oral (07/06 2102) BP: 103/73 (07/06 2102) Pulse Rate: 86 (07/06 2102)  Labs: Recent Labs    03/05/22 1401 03/05/22 2028  HGB 13.0 13.4  HCT 40.1 41.1  PLT 236 247  HEPARINUNFRC  --  0.67    Estimated Creatinine Clearance: 68.8 mL/min (by C-G formula based on SCr of 0.75 mg/dL).   Medical History: Past Medical History:  Diagnosis Date   Fall    Hard of hearing     Assessment: 66 yo female admitted over a month ago with submassive PE. Previously anticoagulated with heparin, but stopped due to retroperitoneal bleed. Has been on VTE prophylactic lovenox since 02/09/22. D/W Dr. Sloan Leiter who wants to re-challenge patient with therapeutic heparin gently and monitor for rebleed. No bolus and aim for low therapeutic end of range.   Hgb 13.4, Plt 247 Heparin level = 0.67 Heparin infusion rate = 1000 units/hr No s/sx of bleeding per RN  Goal of Therapy:  Heparin level 0.3-0.5 Monitor platelets by anticoagulation protocol: Yes   Plan:  Decrease heparin infusion rate to 850 units/hr Heparin level in 6 hours with AM labs CBC q12h for now per MD Daily heparin level Monitor for bleeding  Luisa Hart, PharmD, BCPS Clinical Pharmacist 03/05/2022 9:52 PM   Please refer to [place] for pharmacy phone number;a

## 2022-03-05 NOTE — Plan of Care (Signed)
  Problem: Education: Goal: Ability to describe self-care measures that may prevent or decrease complications (Diabetes Survival Skills Education) will improve Outcome: Progressing   

## 2022-03-06 DIAGNOSIS — D62 Acute posthemorrhagic anemia: Secondary | ICD-10-CM | POA: Diagnosis not present

## 2022-03-06 DIAGNOSIS — I2609 Other pulmonary embolism with acute cor pulmonale: Secondary | ICD-10-CM | POA: Diagnosis not present

## 2022-03-06 DIAGNOSIS — E119 Type 2 diabetes mellitus without complications: Secondary | ICD-10-CM | POA: Diagnosis not present

## 2022-03-06 DIAGNOSIS — G9389 Other specified disorders of brain: Secondary | ICD-10-CM | POA: Diagnosis not present

## 2022-03-06 LAB — CBC
HCT: 40.4 % (ref 36.0–46.0)
HCT: 39.1 % (ref 36.0–46.0)
Hemoglobin: 13.1 g/dL (ref 12.0–15.0)
Hemoglobin: 12.5 g/dL (ref 12.0–15.0)
MCH: 29.5 pg (ref 26.0–34.0)
MCH: 28.7 pg (ref 26.0–34.0)
MCHC: 32.4 g/dL (ref 30.0–36.0)
MCHC: 32 g/dL (ref 30.0–36.0)
MCV: 91 fL (ref 80.0–100.0)
MCV: 89.7 fL (ref 80.0–100.0)
Platelets: 238 10*3/uL (ref 150–400)
Platelets: 247 10*3/uL (ref 150–400)
RBC: 4.44 MIL/uL (ref 3.87–5.11)
RBC: 4.36 MIL/uL (ref 3.87–5.11)
RDW: 19.9 % — ABNORMAL HIGH (ref 11.5–15.5)
RDW: 19.8 % — ABNORMAL HIGH (ref 11.5–15.5)
WBC: 9.8 10*3/uL (ref 4.0–10.5)
WBC: 14.6 10*3/uL — ABNORMAL HIGH (ref 4.0–10.5)
nRBC: 0.2 % (ref 0.0–0.2)
nRBC: 0 % (ref 0.0–0.2)

## 2022-03-06 LAB — HEPARIN LEVEL (UNFRACTIONATED)
Heparin Unfractionated: 0.69 IU/mL (ref 0.30–0.70)
Heparin Unfractionated: 0.22 IU/mL — ABNORMAL LOW (ref 0.30–0.70)
Heparin Unfractionated: 0.34 IU/mL (ref 0.30–0.70)

## 2022-03-06 MED ORDER — MELATONIN 5 MG PO TABS
5.0000 mg | ORAL_TABLET | Freq: Every evening | ORAL | Status: DC | PRN
  Administered 2022-03-06 – 2022-03-30 (×20): 5 mg via ORAL
  Filled 2022-03-06 (×22): qty 1

## 2022-03-06 NOTE — Progress Notes (Signed)
ANTICOAGULATION CONSULT NOTE - Follow-Up Consult  Pharmacy Consult for Heparin Indication: pulmonary embolus  Allergies  Allergen Reactions   Penicillins Anaphylaxis    Patient Measurements: Height: '5\' 5"'$  (165.1 cm) Weight: 70.1 kg (154 lb 8.7 oz) IBW/kg (Calculated) : 57 Heparin Dosing Weight: 70 kg  Vital Signs: Temp: 98.7 F (37.1 C) (07/07 0941) Temp Source: Oral (07/07 0941) BP: 100/74 (07/07 0941) Pulse Rate: 79 (07/07 0941)  Labs: Recent Labs    03/05/22 1401 03/05/22 2028 03/06/22 0541 03/06/22 1353  HGB 13.0 13.4 13.1  --   HCT 40.1 41.1 40.4  --   PLT 236 247 238  --   HEPARINUNFRC  --  0.67 0.69 0.22*     Estimated Creatinine Clearance: 68.8 mL/min (by C-G formula based on SCr of 0.75 mg/dL).   Medical History: Past Medical History:  Diagnosis Date   Fall    Hard of hearing     Assessment: 66 yo female admitted over a month ago with submassive PE. Previously anticoagulated with heparin, but stopped due to retroperitoneal bleed. Has been on VTE prophylactic lovenox since 02/09/22. D/W Dr. Sloan Leiter who wants to re-challenge patient with therapeutic heparin gently and monitor for rebleed. No bolus and aim for low therapeutic end of range.   No s/sx of bleeding noted. CBC stable this morning.  Heparin level subtherapeutic at 0.22 this afternoon after rate decrease this morning. Per RN, there were a few instances of occlusion today.   Goal of Therapy:  Heparin level 0.3-0.5 Monitor platelets by anticoagulation protocol: Yes   Plan:  Increase heparin infusion rate to 750 units/hr Heparin level in 6 hours  CBC q12h for now per MD Daily heparin level Monitor for bleeding  Francena Hanly, Pharm.D. [place] PGY1 Pharmacy Resident   03/06/2022 3:39 PM

## 2022-03-06 NOTE — Progress Notes (Signed)
Physical Therapy Treatment Patient Details Name: Jennifer Pacheco MRN: [id number] DOB: Jan 22, 1956 Today's Date: 03/06/2022   History of Present Illness Pt is 66 yo female who was found down in her home and taken to [facility]. 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 Pt with retroperitoneal bleed and IVC filter placed 6/5. PMH: DM2, HTN.    PT Comments    Pt limited today in participation by decreased cognition, pt oriented only to self, very tangential, and unable to follow commands. Pt reports needing to urinate numerous times throughout session but unable to follow any commands to get herself to the [facility] Pt is total A for coming to sitting, mod to total A for maintaining balance sitting EoB and max A to return to bed, after numerous attempts to pivot to [facility]. PT will follow back to see if cognition improves, however if pt unable to follow commands rehabilitation potential is slim to none. Pt will need SNF level care.     Recommendations for follow up therapy are one component of a multi-disciplinary discharge planning process, led by the attending physician.  Recommendations may be updated based on patient status, additional functional criteria and insurance authorization.  Follow Up Recommendations  Skilled nursing-short term rehab (<3 hours/day)     Assistance Recommended at Discharge Frequent or constant Supervision/Assistance  Patient can return home with the following Assistance with cooking/housework;Direct supervision/assist for medications management;Direct supervision/assist for financial management;Assist for transportation;Help with stairs or ramp for entrance;Two people to help with walking and/or transfers;Two people to help with bathing/dressing/bathroom   Equipment Recommendations  Rolling walker (2 wheels);BSC/3in1 (pending progress)       Precautions / Restrictions Precautions Precautions: Fall Precaution Comments:  HOH Restrictions Weight Bearing Restrictions: No     Mobility  Bed Mobility Overal bed mobility: Needs Assistance Bed Mobility: Supine to Sit     Supine to sit: Total assist Sit to supine: Max assist, +2 for physical assistance   General bed mobility comments: pt laying sideways in bed reports she needs to pee, initially agrees to get up to go to the bathroom, but then not able to follow any commands to come to the EoB, with bed pad able to bring pt to seated position, unable to maintain balance leaning on R side, with max A able to hold pt in upright, BSC placed beside her. Pt unable to follow any commands to progress to getting up to bedside commode. PT had to physically lean pt down to her L and pick up LE to get her back into her bed.          Balance Overall balance assessment: Needs assistance Sitting-balance support: Feet supported, No upper extremity supported Sitting balance-Leahy Scale: Zero Sitting balance - Comments: requires outside assit to maintain upright Postural control: Right lateral lean                                  Cognition Arousal/Alertness: Awake/alert Behavior During Therapy: Flat affect Overall Cognitive Status: No family/caregiver present to determine baseline cognitive functioning Area of Impairment: Attention, Following commands, Safety/judgement, Awareness, Problem solving                 Orientation Level: Disoriented to, Time, Situation, Place, Person Current Attention Level: Sustained Memory: Decreased recall of precautions, Decreased short-term memory Following Commands: Follows one step commands inconsistently, Follows one step commands with increased time  Safety/Judgement: Decreased awareness of safety, Decreased awareness of deficits Awareness: Intellectual Problem Solving: Difficulty sequencing, Requires verbal cues, Requires tactile cues, Slow processing, Decreased initiation General Comments: Pt very tangential,  follows >10% of commands, disoriented to place, time and situation, no initiation,           General Comments  VSS      Pertinent Vitals/Pain Pain Assessment Pain Assessment: Faces Faces Pain Scale: Hurts a little bit Pain Location: generalized Pain Descriptors / Indicators: Discomfort, Grimacing, Guarding     PT Goals (current goals can now be found in the care plan section) Acute Rehab PT Goals Patient Stated Goal: To get ice cream and BBQ PT Goal Formulation: Patient unable to participate in goal setting Time For Goal Achievement: 03/16/22 Potential to Achieve Goals: Poor Progress towards PT goals: Not progressing toward goals - comment    Frequency    Min 3X/week      PT Plan Current plan remains appropriate;Other (comment)       AM-PAC PT "6 Clicks" Mobility   Outcome Measure  Help needed turning from your back to your side while in a flat bed without using bedrails?: Total Help needed moving from lying on your back to sitting on the side of a flat bed without using bedrails?: Total Help needed moving to and from a bed to a chair (including a wheelchair)?: Total Help needed standing up from a chair using your arms (e.g., wheelchair or bedside chair)?: Total Help needed to walk in hospital room?: Total Help needed climbing 3-5 steps with a railing? : Total 6 Click Score: 6    End of Session Equipment Utilized During Treatment: Gait belt Activity Tolerance: Patient tolerated treatment well (limited due to decreased cognition) Patient left: in bed;with bed alarm set Nurse Communication: Mobility status PT Visit Diagnosis: Unsteadiness on feet (R26.81);Difficulty in walking, not elsewhere classified (R26.2)     Time: 1459-1510 PT Time Calculation (min) (ACUTE ONLY): 11 min  Charges:  $Therapeutic Activity: 8-22 mins                     Jennifer Mokry B. Migdalia Dk PT, DPT Acute Rehabilitation Services Please use secure chat or  Call Office [phone]    F[place] 03/06/2022, 4:20 PM

## 2022-03-06 NOTE — Progress Notes (Signed)
ANTICOAGULATION CONSULT NOTE - Follow Up Consult  Pharmacy Consult for heparin Indication: pulmonary embolus  Labs: Recent Labs    03/05/22 1401 03/05/22 2028 03/06/22 0541  HGB 13.0 13.4 13.1  HCT 40.1 41.1 40.4  PLT 236 247 238  HEPARINUNFRC  --  0.67 0.69    Assessment: 66yo female remains supratherapeutic on heparin with no change in heparin level despite decreased rate; no infusion issues or signs of bleeding per RN, Hgb stable.  Goal of Therapy:  Heparin level 0.3-0.5 units/ml   Plan:  Will decrease heparin infusion by 2 units/kg/hr to 700 units/hr and check level in 6 hours.    Wynona Neat, PharmD, BCPS  03/06/2022,7:08 AM

## 2022-03-06 NOTE — Progress Notes (Signed)
PROGRESS NOTE        PATIENT DETAILS Name: Jennifer Pacheco Age: 66 y.o. Sex: female Date of Birth: [birth year] Admit Date: 01/30/2022 Admitting Physician Collier Bullock, MD [id number], Brigitte Pulse, NP  Brief Summary: Patient is a 66 y.o.  female with history of DM-2, HTN who presented to [facility] ED on 6/2 after she was found confused/down in feces/urine-she was hypotensive/hypoxic-she was found to have a submassive PE-CT head showed right basal ganglia mass with mass effect-she was transferred to [place] she was monitored and started on anticoagulation.  Unfortunately further hospital course was complicated by development of a retroperitoneal bleed and acute blood loss anemia.  Heparin was discontinued-IVC filter was placed-and upon further stability-patient was transferred to [facility].   Significant studies: 6/2>> CTA chest: PE with CT evidence of RV strain. 6/2>> CT abdomen/pelvis: Cholelithiasis, CT findings of left pyelonephritis, esophageal/patulous/thickened distally 6/2>> CT head: Large hypodense/irregular mass in the right basal ganglia/right insula-with surrounding edema. 6/3>> RUQ ultrasound: Cholelithiasis/mild pericholecystic fluid/mild gallbladder wall thickening. 6/3>> MRI brain no contrast: 5.2 x 5.3 cm mass on the right-with mass effect and vasogenic edema 6/3>> Echo: EF 30-07%, RV systolic function normal 6/2>> CT angio GI bleed: No areas of active extravasation-large left-sided retroperitoneal hematoma. 6/14>> MRI brain with/without contrast: 5.9 x 5.4 x 5.9 enhancing mass in the right frontotemporal/periinsular region-favored extra-axial in origin and favored to reflect a meningioma. 6/23>> CT head: Large meningioma involving the right frontotemporal region with associated vasogenic edema/regional mass effect 7/5>> acute DVT involving left common iliac, external iliac, common femoral, left femoral, left proximal profunda, left popliteal, left  posterior tibial, left peroneal, left gastrocnemius   Procedures: 6/5>> IVC filter by IR  Consults: PCCM, neurosurgery, GI, IR, palliative care, vascular surgery  Subjective: Pleasantly confused-continues to have LLE swelling-unchanged.  No obvious major events-no bleeding events.  Tolerating rechallenge of heparin okay so far.  Objective: Vitals: Blood pressure 100/74, pulse 79, temperature 98.7 F (37.1 C), temperature source Oral, resp. rate 19, height '5\' 5"'$  (1.651 m), weight 70.1 kg, SpO2 97 %.   Exam: Gen Exam: Not in distress-pleasantly confused HEENT:atraumatic, normocephalic Chest: B/L clear to auscultation anteriorly CVS:S1S2 regular Abdomen:soft non tender, non distended Extremities: LLE swelling-unchanged. Neurology: Non focal Skin: no rash   Pertinent Labs/Radiology:    Latest Ref Rng & Units 03/06/2022    5:41 AM 03/05/2022    8:28 PM 03/05/2022    2:01 PM  CBC  WBC 4.0 - 10.5 K/uL 9.8  10.6  11.6   Hemoglobin 12.0 - 15.0 g/dL 13.1  13.4  13.0   Hematocrit 36.0 - 46.0 % 40.4  41.1  40.1   Platelets 150 - 400 K/uL 238  247  236     Lab Results  Component Value Date   NA 137 02/26/2022   K 4.3 02/26/2022   CL 100 02/26/2022   CO2 26 02/26/2022      Assessment/Plan: Submassive PE with acute cor pulmonale: Did not tolerate IV heparin on initial presentation and developed a retroperitoneal bleed-IVC filter placed.  Unfortunately over the past several days has developed a significant DVT in LLE-see below.  LLE DVT: Significant clot burden in LLE on Doppler ultrasound on 7/5-difficult situation-has IVC filter in place-discussed with vascular surgery Dr. Stanford Breed on 7/6-not a candidate for thrombectomy-recommendations were to rechallenge with IV heparin-which she seems to be tolerating well.  Continue to monitor CBC closely.  If tolerates IV heparin for 48 hours-suspects can be transition to oral Eliquis.  Acute blood loss anemia due to retroperitoneal bleed:  Hemoglobin now stable-no signs of recurrent bleeding while on IV heparin.  Acute metabolic encephalopathy: Multifactorial-due to acute illness/brain mass-suspicion for some underlying cognitive issue (per prior note-estranged from family-does strange things-fails to recognize her brother at times).  Awake/alert this morning.  Apparently has some impaired hearing which could contribute.  Unfortunately-she remains confused-likely due to chronic cognitive dysfunction-family does not want to participate in her care-social worker following-guardianship process in progress.  Large right basal ganglia mass with surrounding edema/11 mm midline shift: Continues to have left hemiparesis-on steroids-Per prior notes-now thought to be a meningioma-neurosurgery recommending outpatient follow-up in 2-3 months for reevaluation/craniotomy.  Spoke with neurosurgeon-Dr. Dawley on 7/6-no contraindication for anticoagulation-suggest that we slowly taper off Decadron in 2 weeks.  Multifactorial shock-due to hypovolemia/submassive PE/septic shock (pyelonephritis): BP stable-hemoglobin stable-no longer on antibiotics for  Acute hypoxic respiratory failure: Resolved-due to PE-on room air.  Distal esophageal thickening: Noted on CT-evaluated by GI-recommendations are for PPI-EGD deferred for now-could be possibly done in the outpatient setting.  DM-2 (A1c 5.5 on 6/2): CBG stable-hold metformin for now.  Recent Labs    03/04/22 0800  GLUCAP 97     Deconditioning: Evaluated by therapy-recommendations are for SNF.  Other issues: APS involved-social work following-guardianship process underway.  Discussed with social work Arbie Cookey on 7/6-no family member wants to make any decisions-guardianship hearing scheduled for tomorrow.   Pressure Ulcer: Pressure Injury 01/30/22 Sacrum Right;Medial Stage 2 -  Partial thickness loss of dermis presenting as a shallow open injury with a red, pink wound bed without slough. (Active)   01/30/22 2053  Location: Sacrum  Location Orientation: Right;Medial  Staging: Stage 2 -  Partial thickness loss of dermis presenting as a shallow open injury with a red, pink wound bed without slough.  Wound Description (Comments):   Present on Admission: Yes  Dressing Type Foam - Lift dressing to assess site every shift 03/06/22 0800     Pressure Injury 01/30/22 Coccyx Lower;Medial Stage 2 -  Partial thickness loss of dermis presenting as a shallow open injury with a red, pink wound bed without slough. (Active)  01/30/22 2053  Location: Coccyx  Location Orientation: Lower;Medial  Staging: Stage 2 -  Partial thickness loss of dermis presenting as a shallow open injury with a red, pink wound bed without slough.  Wound Description (Comments):   Present on Admission: Yes  Dressing Type Foam - Lift dressing to assess site every shift 03/06/22 0800    BMI: Estimated body mass index is 25.72 kg/m as calculated from the following:   Height as of this encounter: '5\' 5"'$  (1.651 m).   Weight as of this encounter: 70.1 kg.   Code status:   Code Status: Full Code   DVT Prophylaxis: Place and maintain sequential compression device Start: 02/02/22 1450   IV heparin.   Family Communication: None at [place] for patient's friend-[phone] on 7/6.  Per social work-no family member wants to make decisions.   Disposition Plan: Status is: Inpatient Remains inpatient appropriate because: Awaiting guardianship process-SNF placement.   Planned Discharge Destination:Skilled nursing facility   Diet: Diet Order             DIET DYS 3 Room service appropriate? Yes; Fluid consistency: Thin  Diet effective now  Antimicrobial agents: Anti-infectives (From admission, onward)    Start     Dose/Rate Route Frequency Ordered Stop   02/03/22 1715  levofloxacin (LEVAQUIN) tablet 500 mg  Status:  Discontinued        500 mg Oral Daily 02/03/22 1627  02/05/22 1355   02/02/22 2045  levofloxacin (LEVAQUIN) IVPB 500 mg  Status:  Discontinued       Note to Pharmacy: Adjust as needed - switching to IV since unable to take PO   500 mg 100 mL/hr over 60 Minutes Intravenous Every 24 hours 02/02/22 1959 02/03/22 1627   02/01/22 1400  levofloxacin (LEVAQUIN) tablet 500 mg  Status:  Discontinued        500 mg Oral Daily 02/01/22 0911 02/02/22 1959   01/31/22 1000  vancomycin (VANCOREADY) IVPB 750 mg/150 mL  Status:  Discontinued        750 mg 150 mL/hr over 60 Minutes Intravenous Every 12 hours 01/31/22 0003 01/31/22 0848   01/31/22 0200  vancomycin (VANCOREADY) IVPB 1500 mg/300 mL  Status:  Discontinued        1,500 mg 150 mL/hr over 120 Minutes Intravenous Every 12 hours 01/30/22 1241 01/30/22 2348   01/30/22 2100  aztreonam (AZACTAM) 2 g in sodium chloride 0.9 % 100 mL IVPB  Status:  Discontinued        2 g 200 mL/hr over 30 Minutes Intravenous Every 8 hours 01/30/22 1240 02/01/22 0923   01/30/22 1245  aztreonam (AZACTAM) 2 g in sodium chloride 0.9 % 100 mL IVPB        2 g 200 mL/hr over 30 Minutes Intravenous  Once 01/30/22 1231 01/30/22 1408   01/30/22 1245  metroNIDAZOLE (FLAGYL) IVPB 500 mg        500 mg 100 mL/hr over 60 Minutes Intravenous  Once 01/30/22 1231 01/30/22 1408   01/30/22 1245  vancomycin (VANCOCIN) IVPB 1000 mg/200 mL premix  Status:  Discontinued        1,000 mg 200 mL/hr over 60 Minutes Intravenous  Once 01/30/22 1231 01/30/22 1239   01/30/22 1245  vancomycin (VANCOREADY) IVPB 2000 mg/400 mL        2,000 mg 200 mL/hr over 120 Minutes Intravenous  Once 01/30/22 1239 01/30/22 1607        MEDICATIONS: Scheduled Meds:  dexamethasone  3 mg Oral Daily   docusate sodium  100 mg Oral BID   gabapentin  300 mg Oral BID   pantoprazole  40 mg Oral BID   polyethylene glycol  17 g Oral BID   senna  1 tablet Oral QHS   Continuous Infusions:  sodium chloride Stopped (02/02/22 0929)   heparin 700 Units/hr (03/06/22 0711)    PRN Meds:.acetaminophen, calcium carbonate, iohexol, melatonin, ondansetron (ZOFRAN) IV   I have personally reviewed following labs and imaging studies  LABORATORY DATA: CBC: Recent Labs  Lab 03/05/22 1401 03/05/22 2028 03/06/22 0541  WBC 11.6* 10.6* 9.8  HGB 13.0 13.4 13.1  HCT 40.1 41.1 40.4  MCV 88.1 88.2 91.0  PLT 236 247 238     Basic Metabolic Panel: Recent Labs  Lab 03/02/22 0754  CREATININE 0.75     GFR: Estimated Creatinine Clearance: 68.8 mL/min (by C-G formula based on SCr of 0.75 mg/dL).  Liver Function Tests: No results for input(s): "AST", "ALT", "ALKPHOS", "BILITOT", "PROT", "ALBUMIN" in the last 168 hours. No results for input(s): "LIPASE", "AMYLASE" in the last 168 hours. No results for input(s): "AMMONIA" in the last 168 hours.  Coagulation  Profile: No results for input(s): "INR", "PROTIME" in the last 168 hours.  Cardiac Enzymes: No results for input(s): "CKTOTAL", "CKMB", "CKMBINDEX", "TROPONINI" in the last 168 hours.  BNP (last 3 results) No results for input(s): "PROBNP" in the last 8760 hours.  Lipid Profile: No results for input(s): "CHOL", "HDL", "LDLCALC", "TRIG", "CHOLHDL", "LDLDIRECT" in the last 72 hours.  Thyroid Function Tests: No results for input(s): "TSH", "T4TOTAL", "FREET4", "T3FREE", "THYROIDAB" in the last 72 hours.  Anemia Panel: No results for input(s): "VITAMINB12", "FOLATE", "FERRITIN", "TIBC", "IRON", "RETICCTPCT" in the last 72 hours.  Urine analysis:    Component Value Date/Time   COLORURINE YELLOW 01/30/2022 1720   APPEARANCEUR CLEAR 01/30/2022 1720   LABSPEC 1.044 (H) 01/30/2022 1720   PHURINE 5.0 01/30/2022 1720   GLUCOSEU NEGATIVE 01/30/2022 1720   HGBUR SMALL (A) 01/30/2022 1720   BILIRUBINUR NEGATIVE 01/30/2022 1720   KETONESUR 20 (A) 01/30/2022 1720   PROTEINUR 30 (A) 01/30/2022 1720   NITRITE POSITIVE (A) 01/30/2022 1720   LEUKOCYTESUR SMALL (A) 01/30/2022 1720    Sepsis Labs: Lactic Acid,  Venous    Component Value Date/Time   LATICACIDVEN 1.7 02/03/2022 2101    MICROBIOLOGY: No results found for this or any previous visit (from the past 240 hour(s)).  RADIOLOGY STUDIES/RESULTS: VAS [place] LOWER EXTREMITY VENOUS (DVT)  Result Date: 03/04/2022  Lower Venous DVT Study Patient Name:  Jennifer Pacheco [facility]  Date of Exam:   03/04/2022 Medical Rec #: [id number]               Accession #:    [id number] Date of Birth: [birth year]                Patient Gender: F Patient Age:   22 years Exam Location:  [facility] Procedure:      VAS [place] LOWER EXTREMITY VENOUS (DVT) Referring Phys: Inda Merlin --------------------------------------------------------------------------------  Indications: Pain.  Risk Factors: IVC filter. Limitations: Poor ultrasound/tissue interface and patient positioning. Comparison Study: No prior studies. Performing Technologist: Oliver Hum RVT  Examination Guidelines: A complete evaluation includes B-mode imaging, spectral Doppler, color Doppler, and power Doppler as needed of all accessible portions of each vessel. Bilateral testing is considered an integral part of a complete examination. Limited examinations for reoccurring indications may be performed as noted. The reflux portion of the exam is performed with the patient in reverse Trendelenburg.  +---------+---------------+---------+-----------+----------+--------------+ RIGHT    CompressibilityPhasicitySpontaneityPropertiesThrombus Aging +---------+---------------+---------+-----------+----------+--------------+ CFV      Full           Yes      Yes                                 +---------+---------------+---------+-----------+----------+--------------+ SFJ      Full                                                        +---------+---------------+---------+-----------+----------+--------------+ FV Prox  Full                                                         +---------+---------------+---------+-----------+----------+--------------+ FV Mid  Full                                                        +---------+---------------+---------+-----------+----------+--------------+ FV DistalFull                                                        +---------+---------------+---------+-----------+----------+--------------+ PFV      Full                                                        +---------+---------------+---------+-----------+----------+--------------+ POP      Full           Yes      Yes                                 +---------+---------------+---------+-----------+----------+--------------+ PTV      Full                                                        +---------+---------------+---------+-----------+----------+--------------+ PERO     Full                                                        +---------+---------------+---------+-----------+----------+--------------+   +---------+---------------+---------+-----------+----------+--------------+ LEFT     CompressibilityPhasicitySpontaneityPropertiesThrombus Aging +---------+---------------+---------+-----------+----------+--------------+ CFV      Partial        No       No                   Acute          +---------+---------------+---------+-----------+----------+--------------+ SFJ      Full                                                        +---------+---------------+---------+-----------+----------+--------------+ FV Prox  None           No       No                   Acute          +---------+---------------+---------+-----------+----------+--------------+ FV Mid   None           No       No                   Acute          +---------+---------------+---------+-----------+----------+--------------+ FV DistalNone  No       No                   Acute           +---------+---------------+---------+-----------+----------+--------------+ PFV      None           No       No                   Acute          +---------+---------------+---------+-----------+----------+--------------+ POP      None           No       No                   Acute          +---------+---------------+---------+-----------+----------+--------------+ PTV      Partial                                      Acute          +---------+---------------+---------+-----------+----------+--------------+ PERO     Partial                                      Acute          +---------+---------------+---------+-----------+----------+--------------+ Gastroc  Partial                                      Acute          +---------+---------------+---------+-----------+----------+--------------+ EIV                     No       No                   Acute          +---------+---------------+---------+-----------+----------+--------------+ CIV      None           No       No                   Acute          +---------+---------------+---------+-----------+----------+--------------+ Unable to visualize the distal IVC.    Summary: RIGHT: - There is no evidence of deep vein thrombosis in the lower extremity.  - No cystic structure found in the popliteal fossa.  LEFT: - Findings consistent with acute deep vein thrombosis involving the left common iliac vein, left external iliac vein, left common femoral vein, left femoral vein, left proximal profunda vein, left popliteal vein, left posterior tibial veins, left peroneal veins, and left gastrocnemius veins. - No cystic structure found in the popliteal fossa.  *See table(s) above for measurements and observations. Electronically signed by Harold Barban MD on 03/04/2022 at 6:01:09 PM.    Final      LOS: 8 days   Oren Binet, MD  [facility]    To contact the attending provider between 7A-7P or the covering provider  during after hours 7P-7A, please log into the web site [email or web address] and access using universal [place] password for that web site. If you do not have the password, please call the hospital operator.  03/06/2022, 10:20 AM

## 2022-03-06 NOTE — Progress Notes (Addendum)
CSW was notified by Jed Limerick of [place] that the patient has not been served with necessary paperwork and the court hearing has been cancelled and will be rescheduled once the patient has been served.  CSW spoke with Corene Cornea of [place] regarding patient - Corene Cornea to notify CSW of new court date once that information becomes available.  CSW spoke with Denyse Amass [facility] Supervisor regarding patient and the delay this will cause for discharge planning.  CSW spoke with Dr. Sloan Leiter to inform him of discharge delay.  Madilyn Fireman, MSW, LCSW Transitions of Care  Clinical Social Worker II [phone]

## 2022-03-07 DIAGNOSIS — I2609 Other pulmonary embolism with acute cor pulmonale: Secondary | ICD-10-CM | POA: Diagnosis not present

## 2022-03-07 DIAGNOSIS — D62 Acute posthemorrhagic anemia: Secondary | ICD-10-CM | POA: Diagnosis not present

## 2022-03-07 LAB — CBC
HCT: 39.2 % (ref 36.0–46.0)
Hemoglobin: 12.9 g/dL (ref 12.0–15.0)
MCH: 29.3 pg (ref 26.0–34.0)
MCHC: 32.9 g/dL (ref 30.0–36.0)
MCV: 89.1 fL (ref 80.0–100.0)
Platelets: 257 10*3/uL (ref 150–400)
RBC: 4.4 MIL/uL (ref 3.87–5.11)
RDW: 19.7 % — ABNORMAL HIGH (ref 11.5–15.5)
WBC: 9.8 10*3/uL (ref 4.0–10.5)
nRBC: 0 % (ref 0.0–0.2)

## 2022-03-07 LAB — BASIC METABOLIC PANEL
Anion gap: 6 (ref 5–15)
BUN: 13 mg/dL (ref 8–23)
CO2: 30 mmol/L (ref 22–32)
Calcium: 8.7 mg/dL — ABNORMAL LOW (ref 8.9–10.3)
Chloride: 101 mmol/L (ref 98–111)
Creatinine, Ser: 0.61 mg/dL (ref 0.44–1.00)
GFR, Estimated: >60 mL/min (ref >60)
Glucose, Bld: 93 mg/dL (ref 70–99)
Potassium: 4.1 mmol/L (ref 3.5–5.1)
Sodium: 137 mmol/L (ref 135–145)

## 2022-03-07 LAB — HEPARIN LEVEL (UNFRACTIONATED): Heparin Unfractionated: 0.35 IU/mL (ref 0.30–0.70)

## 2022-03-07 NOTE — Progress Notes (Signed)
ANTICOAGULATION CONSULT NOTE - Follow Up Consult  Pharmacy Consult for heparin Indication: pulmonary embolus  Labs: Recent Labs    03/05/22 2028 03/06/22 0541 03/06/22 1353 03/06/22 1650 03/06/22 2329  HGB 13.4 13.1  --  12.5  --   HCT 41.1 40.4  --  39.1  --   PLT 247 238  --  247  --   HEPARINUNFRC 0.67 0.69 0.22*  --  0.34     Assessment/Plan:  66yo female therapeutic on heparin after rate adjustments. Will continue infusion at current rate of 750 units/hr and confirm stable with am labs.   Wynona Neat, PharmD, BCPS  03/07/2022,12:35 AM

## 2022-03-07 NOTE — Progress Notes (Signed)
PROGRESS NOTE        PATIENT DETAILS Name: Jennifer Pacheco Age: 66 y.o. Sex: female Date of Birth: 03-27-1956 Admit Date: 01/30/2022 Admitting Physician Collier Bullock, MD [id number], Brigitte Pulse, NP  Brief Summary: Patient is a 66 y.o.  female with history of DM-2, HTN who presented to [facility] ED on 6/2 after she was found confused/down in feces/urine-she was hypotensive/hypoxic-she was found to have a submassive PE-CT head showed right basal ganglia mass with mass effect-she was transferred to [place] she was monitored and started on anticoagulation.  Unfortunately further hospital course was complicated by development of a retroperitoneal bleed and acute blood loss anemia.  Heparin was discontinued-IVC filter was placed-and upon further stability-patient was transferred to [facility].   Significant studies: 6/2>> CTA chest: PE with CT evidence of RV strain. 6/2>> CT abdomen/pelvis: Cholelithiasis, CT findings of left pyelonephritis, esophageal/patulous/thickened distally 6/2>> CT head: Large hypodense/irregular mass in the right basal ganglia/right insula-with surrounding edema. 6/3>> RUQ ultrasound: Cholelithiasis/mild pericholecystic fluid/mild gallbladder wall thickening. 6/3>> MRI brain no contrast: 5.2 x 5.3 cm mass on the right-with mass effect and vasogenic edema 6/3>> Echo: EF 65-78%, RV systolic function normal 4/6>> CT angio GI bleed: No areas of active extravasation-large left-sided retroperitoneal hematoma. 6/14>> MRI brain with/without contrast: 5.9 x 5.4 x 5.9 enhancing mass in the right frontotemporal/periinsular region-favored extra-axial in origin and favored to reflect a meningioma. 6/23>> CT head: Large meningioma involving the right frontotemporal region with associated vasogenic edema/regional mass effect 7/5>> acute DVT involving left common iliac, external iliac, common femoral, left femoral, left proximal profunda, left popliteal, left  posterior tibial, left peroneal, left gastrocnemius   Procedures: 6/5>> IVC filter by IR  Consults: PCCM, neurosurgery, GI, IR, palliative care, vascular surgery  Subjective: Pleasantly confused-no major issues overnight-slight improvement in LLE swelling.  Objective: Vitals: Blood pressure (!) 113/53, pulse (!) 58, temperature 97.8 F (36.6 C), temperature source Oral, resp. rate 16, height '5\' 5"'$  (1.651 m), weight 70 kg, SpO2 100 %.   Exam: Gen Exam:Alert awake-not in any distress HEENT:atraumatic, normocephalic Chest: B/L clear to auscultation anteriorly CVS:S1S2 regular Abdomen:soft non tender, non distended Extremities: Slight improvement in LLE swelling. Neurology: Non focal Skin: no rash   Pertinent Labs/Radiology:    Latest Ref Rng & Units 03/07/2022    7:29 AM 03/06/2022    4:50 PM 03/06/2022    5:41 AM  CBC  WBC 4.0 - 10.5 K/uL 9.8  14.6  9.8   Hemoglobin 12.0 - 15.0 g/dL 12.9  12.5  13.1   Hematocrit 36.0 - 46.0 % 39.2  39.1  40.4   Platelets 150 - 400 K/uL 257  247  238     Lab Results  Component Value Date   NA 137 03/07/2022   K 4.1 03/07/2022   CL 101 03/07/2022   CO2 30 03/07/2022      Assessment/Plan: Submassive PE with acute cor pulmonale: Did not tolerate IV heparin on initial presentation and developed a retroperitoneal bleed-IVC filter placed.  Unfortunately over the past several days has developed a significant DVT in LLE-and has been started on IV heparin-see below.  LLE DVT: Significant clot burden in LLE on Doppler ultrasound on 7/5-difficult situation-has IVC filter in place-discussed with vascular surgery Dr. Stanford Breed on 7/6-not a candidate for thrombectomy-recommendations were to rechallenge with IV heparin-which she seems to be tolerating well.  If she continues to tolerate heparin well-suspect we can transition to Eliquis on 7/9.    Acute blood loss anemia due to retroperitoneal bleed: Hemoglobin now stable-no signs of recurrent bleeding while  on IV heparin.  Acute metabolic encephalopathy: Multifactorial-due to acute illness/brain mass-suspicion for some underlying cognitive issue (per prior note-estranged from family-does strange things-fails to recognize her brother at times).  Awake/alert this morning.  Apparently has some impaired hearing which could contribute.  Unfortunately-she remains confused-likely due to chronic cognitive dysfunction-family does not want to participate in her care-social worker following-guardianship process in progress.  Large right basal ganglia mass with surrounding edema/11 mm midline shift: Continues to have left hemiparesis-on steroids-Per prior notes-now thought to be a meningioma-neurosurgery recommending outpatient follow-up in 2-3 months for reevaluation/craniotomy.  Spoke with neurosurgeon-Dr. Dawley on 7/6-no contraindication for anticoagulation-suggest that we slowly taper off Decadron in 2 weeks.  Multifactorial shock-due to hypovolemia/submassive PE/septic shock (pyelonephritis): BP stable-hemoglobin stable-no longer on antibiotics for  Acute hypoxic respiratory failure: Resolved-due to PE-on room air.  Distal esophageal thickening: Noted on CT-evaluated by GI-recommendations are for PPI-EGD deferred for now-could be possibly done in the outpatient setting.  DM-2 (A1c 5.5 on 6/2): CBG stable-hold metformin for now.  No results for input(s): "GLUCAP" in the last 72 hours.   Deconditioning: Evaluated by therapy-recommendations are for SNF.  Other issues: APS involved-social work following-guardianship process underway.  Discussed with social work Arbie Cookey on 7/6-no family member wants to make any decisions-guardianship hearing scheduled for tomorrow.   Pressure Ulcer: Pressure Injury 01/30/22 Sacrum Right;Medial Stage 2 -  Partial thickness loss of dermis presenting as a shallow open injury with a red, pink wound bed without slough. (Active)  01/30/22 2053  Location: Sacrum  Location  Orientation: Right;Medial  Staging: Stage 2 -  Partial thickness loss of dermis presenting as a shallow open injury with a red, pink wound bed without slough.  Wound Description (Comments):   Present on Admission: Yes  Dressing Type Foam - Lift dressing to assess site every shift 03/06/22 2059     Pressure Injury 01/30/22 Coccyx Lower;Medial Stage 2 -  Partial thickness loss of dermis presenting as a shallow open injury with a red, pink wound bed without slough. (Active)  01/30/22 2053  Location: Coccyx  Location Orientation: Lower;Medial  Staging: Stage 2 -  Partial thickness loss of dermis presenting as a shallow open injury with a red, pink wound bed without slough.  Wound Description (Comments):   Present on Admission: Yes  Dressing Type Foam - Lift dressing to assess site every shift 03/06/22 2059    BMI: Estimated body mass index is 25.68 kg/m as calculated from the following:   Height as of this encounter: '5\' 5"'$  (1.651 m).   Weight as of this encounter: 70 kg.   Code status:   Code Status: Full Code   DVT Prophylaxis: Place and maintain sequential compression device Start: 02/02/22 1450   IV heparin.   Family Communication: None at [place] for patient's friend-[phone] on 7/6.  Per social work-no family member wants to make decisions.   Disposition Plan: Status is: Inpatient Remains inpatient appropriate because: Awaiting guardianship process-SNF placement.   Planned Discharge Destination:Skilled nursing facility   Diet: Diet Order             DIET DYS 3 Room service appropriate? Yes; Fluid consistency: Thin  Diet effective now  Antimicrobial agents: Anti-infectives (From admission, onward)    Start     Dose/Rate Route Frequency Ordered Stop   02/03/22 1715  levofloxacin (LEVAQUIN) tablet 500 mg  Status:  Discontinued        500 mg Oral Daily 02/03/22 1627 02/05/22 1355   02/02/22 2045  levofloxacin  (LEVAQUIN) IVPB 500 mg  Status:  Discontinued       Note to Pharmacy: Adjust as needed - switching to IV since unable to take PO   500 mg 100 mL/hr over 60 Minutes Intravenous Every 24 hours 02/02/22 1959 02/03/22 1627   02/01/22 1400  levofloxacin (LEVAQUIN) tablet 500 mg  Status:  Discontinued        500 mg Oral Daily 02/01/22 0911 02/02/22 1959   01/31/22 1000  vancomycin (VANCOREADY) IVPB 750 mg/150 mL  Status:  Discontinued        750 mg 150 mL/hr over 60 Minutes Intravenous Every 12 hours 01/31/22 0003 01/31/22 0848   01/31/22 0200  vancomycin (VANCOREADY) IVPB 1500 mg/300 mL  Status:  Discontinued        1,500 mg 150 mL/hr over 120 Minutes Intravenous Every 12 hours 01/30/22 1241 01/30/22 2348   01/30/22 2100  aztreonam (AZACTAM) 2 g in sodium chloride 0.9 % 100 mL IVPB  Status:  Discontinued        2 g 200 mL/hr over 30 Minutes Intravenous Every 8 hours 01/30/22 1240 02/01/22 0923   01/30/22 1245  aztreonam (AZACTAM) 2 g in sodium chloride 0.9 % 100 mL IVPB        2 g 200 mL/hr over 30 Minutes Intravenous  Once 01/30/22 1231 01/30/22 1408   01/30/22 1245  metroNIDAZOLE (FLAGYL) IVPB 500 mg        500 mg 100 mL/hr over 60 Minutes Intravenous  Once 01/30/22 1231 01/30/22 1408   01/30/22 1245  vancomycin (VANCOCIN) IVPB 1000 mg/200 mL premix  Status:  Discontinued        1,000 mg 200 mL/hr over 60 Minutes Intravenous  Once 01/30/22 1231 01/30/22 1239   01/30/22 1245  vancomycin (VANCOREADY) IVPB 2000 mg/400 mL        2,000 mg 200 mL/hr over 120 Minutes Intravenous  Once 01/30/22 1239 01/30/22 1607        MEDICATIONS: Scheduled Meds:  dexamethasone  3 mg Oral Daily   docusate sodium  100 mg Oral BID   gabapentin  300 mg Oral BID   pantoprazole  40 mg Oral BID   polyethylene glycol  17 g Oral BID   senna  1 tablet Oral QHS   Continuous Infusions:  sodium chloride Stopped (02/02/22 0929)   heparin 750 Units/hr (03/06/22 2051)   PRN Meds:.acetaminophen, calcium  carbonate, iohexol, melatonin, ondansetron (ZOFRAN) IV   I have personally reviewed following labs and imaging studies  LABORATORY DATA: CBC: Recent Labs  Lab 03/05/22 1401 03/05/22 2028 03/06/22 0541 03/06/22 1650 03/07/22 0729  WBC 11.6* 10.6* 9.8 14.6* 9.8  HGB 13.0 13.4 13.1 12.5 12.9  HCT 40.1 41.1 40.4 39.1 39.2  MCV 88.1 88.2 91.0 89.7 89.1  PLT 236 247 238 247 257     Basic Metabolic Panel: Recent Labs  Lab 03/02/22 0754 03/07/22 0729  NA  --  137  K  --  4.1  CL  --  101  CO2  --  30  GLUCOSE  --  93  BUN  --  13  CREATININE 0.75 0.61  CALCIUM  --  8.7*  GFR: Estimated Creatinine Clearance: 68.8 mL/min (by C-G formula based on SCr of 0.61 mg/dL).  Liver Function Tests: No results for input(s): "AST", "ALT", "ALKPHOS", "BILITOT", "PROT", "ALBUMIN" in the last 168 hours. No results for input(s): "LIPASE", "AMYLASE" in the last 168 hours. No results for input(s): "AMMONIA" in the last 168 hours.  Coagulation Profile: No results for input(s): "INR", "PROTIME" in the last 168 hours.  Cardiac Enzymes: No results for input(s): "CKTOTAL", "CKMB", "CKMBINDEX", "TROPONINI" in the last 168 hours.  BNP (last 3 results) No results for input(s): "PROBNP" in the last 8760 hours.  Lipid Profile: No results for input(s): "CHOL", "HDL", "LDLCALC", "TRIG", "CHOLHDL", "LDLDIRECT" in the last 72 hours.  Thyroid Function Tests: No results for input(s): "TSH", "T4TOTAL", "FREET4", "T3FREE", "THYROIDAB" in the last 72 hours.  Anemia Panel: No results for input(s): "VITAMINB12", "FOLATE", "FERRITIN", "TIBC", "IRON", "RETICCTPCT" in the last 72 hours.  Urine analysis:    Component Value Date/Time   COLORURINE YELLOW 01/30/2022 1720   APPEARANCEUR CLEAR 01/30/2022 1720   LABSPEC 1.044 (H) 01/30/2022 1720   PHURINE 5.0 01/30/2022 1720   GLUCOSEU NEGATIVE 01/30/2022 1720   HGBUR SMALL (A) 01/30/2022 1720   BILIRUBINUR NEGATIVE 01/30/2022 1720   KETONESUR 20  (A) 01/30/2022 1720   PROTEINUR 30 (A) 01/30/2022 1720   NITRITE POSITIVE (A) 01/30/2022 1720   LEUKOCYTESUR SMALL (A) 01/30/2022 1720    Sepsis Labs: Lactic Acid, Venous    Component Value Date/Time   LATICACIDVEN 1.7 02/03/2022 2101    MICROBIOLOGY: No results found for this or any previous visit (from the past 240 hour(s)).  RADIOLOGY STUDIES/RESULTS: No results found.   LOS: 36 days   Oren Binet, MD  [facility]    To contact the attending provider between 7A-7P or the covering provider during after hours 7P-7A, please log into the web site [email or web address] and access using universal [place] password for that web site. If you do not have the password, please call the hospital operator.  03/07/2022, 10:37 AM

## 2022-03-07 NOTE — Progress Notes (Signed)
CSW received notification from Jed Limerick of [place] who states the new interim hearing is scheduled for  03/26/22 @ 10am and the guardianship hearing is scheduled for 04/09/22 @ 10am.  Madilyn Fireman, MSW, LCSW Transitions of Care  Clinical Social Worker II [phone]

## 2022-03-07 NOTE — Progress Notes (Signed)
ANTICOAGULATION CONSULT NOTE - Follow-Up Consult  Pharmacy Consult for Heparin Indication: pulmonary embolus  Allergies  Allergen Reactions   Penicillins Anaphylaxis    Patient Measurements: Height: '5\' 5"'$  (165.1 cm) Weight: 70 kg (154 lb 5.2 oz) IBW/kg (Calculated) : 57 Heparin Dosing Weight: 70 kg  Vital Signs: Temp: 97.8 F (36.6 C) (07/08 0816) Temp Source: Oral (07/08 0816) BP: 113/53 (07/08 0816) Pulse Rate: 58 (07/08 0816)  Labs: Recent Labs    03/06/22 0541 03/06/22 1353 03/06/22 1650 03/06/22 2329 03/07/22 0729  HGB 13.1  --  12.5  --  12.9  HCT 40.4  --  39.1  --  39.2  PLT 238  --  247  --  257  HEPARINUNFRC 0.69 0.22*  --  0.34 0.35  CREATININE  --   --   --   --  0.61     Estimated Creatinine Clearance: 68.8 mL/min (by C-G formula based on SCr of 0.61 mg/dL).   Medical History: Past Medical History:  Diagnosis Date   Fall    Hard of hearing     Assessment: 66 yo female admitted over a month ago with submassive PE. Previously anticoagulated with heparin, but stopped due to retroperitoneal bleed. Has been on VTE prophylactic lovenox since 02/09/22. D/W Dr. Sloan Leiter who wants to re-challenge patient with therapeutic heparin gently and monitor for rebleed. No bolus and aim for low therapeutic end of range.   No s/sx of bleeding noted. CBC stable this morning.  Heparin level therapeutic at 0.35.   Goal of Therapy:  Heparin level 0.3-0.5 Monitor platelets by anticoagulation protocol: Yes   Plan:  Continue heparin infusion rate to 750 units/hr CBC q12h for now per MD Daily heparin level Monitor for bleeding  Jerime Arif A. Levada Dy, PharmD, BCPS, FNKF Clinical Pharmacist  Please utilize Amion for appropriate phone number to reach the unit pharmacist ([place])   03/07/2022 9:16 AM

## 2022-03-08 DIAGNOSIS — D62 Acute posthemorrhagic anemia: Secondary | ICD-10-CM | POA: Diagnosis not present

## 2022-03-08 DIAGNOSIS — I2609 Other pulmonary embolism with acute cor pulmonale: Secondary | ICD-10-CM | POA: Diagnosis not present

## 2022-03-08 LAB — CBC
HCT: 40.4 % (ref 36.0–46.0)
Hemoglobin: 13.2 g/dL (ref 12.0–15.0)
MCH: 29.1 pg (ref 26.0–34.0)
MCHC: 32.7 g/dL (ref 30.0–36.0)
MCV: 89 fL (ref 80.0–100.0)
Platelets: 266 10*3/uL (ref 150–400)
RBC: 4.54 MIL/uL (ref 3.87–5.11)
RDW: 19.6 % — ABNORMAL HIGH (ref 11.5–15.5)
WBC: 10.7 10*3/uL — ABNORMAL HIGH (ref 4.0–10.5)
nRBC: 0 % (ref 0.0–0.2)

## 2022-03-08 LAB — HEPARIN LEVEL (UNFRACTIONATED): Heparin Unfractionated: 0.33 IU/mL (ref 0.30–0.70)

## 2022-03-08 LAB — GLUCOSE, CAPILLARY: Glucose-Capillary: 103 mg/dL — ABNORMAL HIGH (ref 70–99)

## 2022-03-08 MED ORDER — APIXABAN 5 MG PO TABS
5.0000 mg | ORAL_TABLET | Freq: Two times a day (BID) | ORAL | Status: DC
  Administered 2022-03-08 – 2022-04-01 (×49): 5 mg via ORAL
  Filled 2022-03-08 (×49): qty 1

## 2022-03-08 NOTE — Discharge Instructions (Signed)
Information on my medicine - ELIQUIS (apixaban)  This medication education was reviewed with me or my healthcare representative as part of my discharge preparation.   Jennifer Pacheco, [place]  Why was Eliquis prescribed for you? Eliquis was prescribed to treat blood clots that may have been found in the veins of your legs (deep vein thrombosis) or in your lungs (pulmonary embolism) and to reduce the risk of them occurring again.  What do You need to know about Eliquis ? The starting dose is ONE 5 mg tablet taken TWICE daily.  Eliquis may be taken with or without food.   Try to take the dose about the same time in the morning and in the evening. If you have difficulty swallowing the tablet whole please discuss with your pharmacist how to take the medication safely.  Take Eliquis exactly as prescribed and DO NOT stop taking Eliquis without talking to the doctor who prescribed the medication.  Stopping may increase your risk of developing a new blood clot.  Refill your prescription before you run out.  After discharge, you should have regular check-up appointments with your healthcare provider that is prescribing your Eliquis.    What do you do if you miss a dose? If a dose of ELIQUIS is not taken at the scheduled time, take it as soon as possible on the same day and twice-daily administration should be resumed. The dose should not be doubled to make up for a missed dose.  Important Safety Information A possible side effect of Eliquis is bleeding. You should call your healthcare provider right away if you experience any of the following: Bleeding from an injury or your nose that does not stop. Unusual colored urine (red or dark brown) or unusual colored stools (red or black). Unusual bruising for unknown reasons. A serious fall or if you hit your head (even if there is no bleeding).  Some medicines may interact with Eliquis and might increase your risk of bleeding or clotting while on  Eliquis. To help avoid this, consult your healthcare provider or pharmacist prior to using any new prescription or non-prescription medications, including herbals, vitamins, non-steroidal anti-inflammatory drugs (NSAIDs) and supplements.  This website has more information on Eliquis (apixaban): http://www.eliquis.com/eliquis/home

## 2022-03-08 NOTE — Plan of Care (Signed)
  Problem: Tissue Perfusion: Goal: Adequacy of tissue perfusion will improve Outcome: Progressing   

## 2022-03-08 NOTE — Progress Notes (Signed)
PROGRESS NOTE        PATIENT DETAILS Name: Jennifer Pacheco Age: 66 y.o. Sex: female Date of Birth: 10-15-1955 Admit Date: 01/30/2022 Admitting Physician Collier Bullock, MD VHQ:IONGEX, Brigitte Pulse, NP  Brief Summary: Patient is a 66 y.o.  female with history of DM-2, HTN who presented to [facility] ED on 6/2 after she was found confused/down in feces/urine-she was hypotensive/hypoxic-she was found to have a submassive PE-CT head showed right basal ganglia mass with mass effect-she was transferred to [facility] she was monitored and started on anticoagulation.  Unfortunately further hospital course was complicated by development of a retroperitoneal bleed and acute blood loss anemia.  Heparin was discontinued-IVC filter was placed-and upon further stability-patient was transferred to [facility].   Significant studies: 6/2>> CTA chest: PE with CT evidence of RV strain. 6/2>> CT abdomen/pelvis: Cholelithiasis, CT findings of left pyelonephritis, esophageal/patulous/thickened distally 6/2>> CT head: Large hypodense/irregular mass in the right basal ganglia/right insula-with surrounding edema. 6/3>> RUQ ultrasound: Cholelithiasis/mild pericholecystic fluid/mild gallbladder wall thickening. 6/3>> MRI brain no contrast: 5.2 x 5.3 cm mass on the right-with mass effect and vasogenic edema 6/3>> Echo: EF 52-84%, RV systolic function normal 1/3>> CT angio GI bleed: No areas of active extravasation-large left-sided retroperitoneal hematoma. 6/14>> MRI brain with/without contrast: 5.9 x 5.4 x 5.9 enhancing mass in the right frontotemporal/periinsular region-favored extra-axial in origin and favored to reflect a meningioma. 6/23>> CT head: Large meningioma involving the right frontotemporal region with associated vasogenic edema/regional mass effect 7/5>> acute DVT involving left common iliac, external iliac, common femoral, left femoral, left proximal profunda, left popliteal, left  posterior tibial, left peroneal, left gastrocnemius   Procedures: 6/5>> IVC filter by IR  Consults: PCCM, neurosurgery, GI, IR, palliative care, vascular surgery  Subjective: Remains pleasantly confused-LLE swelling decreasing.  Objective: Vitals: Blood pressure 112/83, pulse 69, temperature 98.6 F (37 C), temperature source Oral, resp. rate 18, height '5\' 5"'$  (1.651 m), weight 71.2 kg, SpO2 98 %.   Exam: Gen Exam:Alert awake-not in any distress HEENT:atraumatic, normocephalic Chest: B/L clear to auscultation anteriorly CVS:S1S2 regular Abdomen:soft non tender, non distended Extremities:no edema-continues to have LLE swelling Neurology: Non focal Skin: no rash   Pertinent Labs/Radiology:    Latest Ref Rng & Units 03/08/2022    2:56 AM 03/07/2022    7:29 AM 03/06/2022    4:50 PM  CBC  WBC 4.0 - 10.5 K/uL 10.7  9.8  14.6   Hemoglobin 12.0 - 15.0 g/dL 13.2  12.9  12.5   Hematocrit 36.0 - 46.0 % 40.4  39.2  39.1   Platelets 150 - 400 K/uL 266  257  247     Lab Results  Component Value Date   NA 137 03/07/2022   K 4.1 03/07/2022   CL 101 03/07/2022   CO2 30 03/07/2022      Assessment/Plan: Submassive PE with acute cor pulmonale: Did not tolerate IV heparin on initial presentation and developed a retroperitoneal bleed-IVC filter placed.  Unfortunately over the past several days has developed a significant DVT in LLE-see below.  LLE DVT: Significant clot burden in LLE on Doppler ultrasound on 7/5-difficult situation-has IVC filter in place-discussed with vascular surgery Dr. Stanford Breed on 7/6-not a candidate for thrombectomy-recommendations were to rechallenge with IV heparin-which she seems to have tolerated well for 72 hours-hence will transition to Eliquis today.  Continue to follow CBC  periodically.  Acute blood loss anemia due to retroperitoneal bleed: Hemoglobin now stable-no signs of recurrent bleeding while on anticoagulation-continue to follow CBC periodically.  Acute  metabolic encephalopathy: Multifactorial-due to acute illness/brain mass-suspicion for some underlying cognitive issue (per prior note-estranged from family-does strange things-fails to recognize her brother at times).  Awake/alert this morning.  Apparently has some impaired hearing which could contribute.  Vitamin B12 level stable-check TSH/RPR with a.m. labs.  Unfortunately-she remains confused-likely due to chronic cognitive dysfunction-family does not want to participate in her care-social worker following-guardianship process in progress.  Large right basal ganglia mass with surrounding edema/11 mm midline shift: Continues to have left hemiparesis-on steroids-Per prior notes-now thought to be a meningioma-neurosurgery recommending outpatient follow-up in 2-3 months for reevaluation/craniotomy.  Spoke with neurosurgeon-Dr. Dawley on 7/6-no contraindication for anticoagulation-suggest that we slowly taper off Decadron in 2 weeks.  Multifactorial shock-due to hypovolemia/submassive PE/septic shock (pyelonephritis): BP stable-hemoglobin stable-no longer on antibiotics for  Acute hypoxic respiratory failure: Resolved-due to PE-on room air.  Distal esophageal thickening: Noted on CT-evaluated by GI-recommendations are for PPI-EGD deferred for now-could be possibly done in the outpatient setting.  DM-2 (A1c 5.5 on 6/2): CBG stable-hold metformin for now.  Recent Labs    03/08/22 0737  GLUCAP 103*     Deconditioning: Evaluated by therapy-recommendations are for SNF.  Other issues: APS involved-social work following-guardianship process underway.  Discussed with social work Arbie Cookey on 7/6-no family member wants to make any decisions-guardianship hearing scheduled for tomorrow.   Pressure Ulcer: Pressure Injury 01/30/22 Sacrum Right;Medial Stage 2 -  Partial thickness loss of dermis presenting as a shallow open injury with a red, pink wound bed without slough. (Active)  01/30/22 2053  Location:  Sacrum  Location Orientation: Right;Medial  Staging: Stage 2 -  Partial thickness loss of dermis presenting as a shallow open injury with a red, pink wound bed without slough.  Wound Description (Comments):   Present on Admission: Yes  Dressing Type Foam - Lift dressing to assess site every shift 03/08/22 0947     Pressure Injury 01/30/22 Coccyx Lower;Medial Stage 2 -  Partial thickness loss of dermis presenting as a shallow open injury with a red, pink wound bed without slough. (Active)  01/30/22 2053  Location: Coccyx  Location Orientation: Lower;Medial  Staging: Stage 2 -  Partial thickness loss of dermis presenting as a shallow open injury with a red, pink wound bed without slough.  Wound Description (Comments):   Present on Admission: Yes  Dressing Type Foam - Lift dressing to assess site every shift 03/08/22 0947    BMI: Estimated body mass index is 26.12 kg/m as calculated from the following:   Height as of this encounter: '5\' 5"'$  (1.651 m).   Weight as of this encounter: 71.2 kg.   Code status:   Code Status: Full Code   DVT Prophylaxis: Place and maintain sequential compression device Start: 02/02/22 1450 apixaban (ELIQUIS) tablet 5 mg   IV heparin.   Family Communication: None at [place] for patient's friend-[phone] on 7/6.  Per social work-no family member wants to make decisions.   Disposition Plan: Status is: Inpatient Remains inpatient appropriate because: Awaiting guardianship process-SNF placement.   Planned Discharge Destination:Skilled nursing facility   Diet: Diet Order             DIET DYS 3 Room service appropriate? Yes; Fluid consistency: Thin  Diet effective now  Antimicrobial agents: Anti-infectives (From admission, onward)    Start     Dose/Rate Route Frequency Ordered Stop   02/03/22 1715  levofloxacin (LEVAQUIN) tablet 500 mg  Status:  Discontinued        500 mg Oral Daily 02/03/22 1627  02/05/22 1355   02/02/22 2045  levofloxacin (LEVAQUIN) IVPB 500 mg  Status:  Discontinued       Note to Pharmacy: Adjust as needed - switching to IV since unable to take PO   500 mg 100 mL/hr over 60 Minutes Intravenous Every 24 hours 02/02/22 1959 02/03/22 1627   02/01/22 1400  levofloxacin (LEVAQUIN) tablet 500 mg  Status:  Discontinued        500 mg Oral Daily 02/01/22 0911 02/02/22 1959   01/31/22 1000  vancomycin (VANCOREADY) IVPB 750 mg/150 mL  Status:  Discontinued        750 mg 150 mL/hr over 60 Minutes Intravenous Every 12 hours 01/31/22 0003 01/31/22 0848   01/31/22 0200  vancomycin (VANCOREADY) IVPB 1500 mg/300 mL  Status:  Discontinued        1,500 mg 150 mL/hr over 120 Minutes Intravenous Every 12 hours 01/30/22 1241 01/30/22 2348   01/30/22 2100  aztreonam (AZACTAM) 2 g in sodium chloride 0.9 % 100 mL IVPB  Status:  Discontinued        2 g 200 mL/hr over 30 Minutes Intravenous Every 8 hours 01/30/22 1240 02/01/22 0923   01/30/22 1245  aztreonam (AZACTAM) 2 g in sodium chloride 0.9 % 100 mL IVPB        2 g 200 mL/hr over 30 Minutes Intravenous  Once 01/30/22 1231 01/30/22 1408   01/30/22 1245  metroNIDAZOLE (FLAGYL) IVPB 500 mg        500 mg 100 mL/hr over 60 Minutes Intravenous  Once 01/30/22 1231 01/30/22 1408   01/30/22 1245  vancomycin (VANCOCIN) IVPB 1000 mg/200 mL premix  Status:  Discontinued        1,000 mg 200 mL/hr over 60 Minutes Intravenous  Once 01/30/22 1231 01/30/22 1239   01/30/22 1245  vancomycin (VANCOREADY) IVPB 2000 mg/400 mL        2,000 mg 200 mL/hr over 120 Minutes Intravenous  Once 01/30/22 1239 01/30/22 1607        MEDICATIONS: Scheduled Meds:  apixaban  5 mg Oral BID   dexamethasone  3 mg Oral Daily   docusate sodium  100 mg Oral BID   gabapentin  300 mg Oral BID   pantoprazole  40 mg Oral BID   polyethylene glycol  17 g Oral BID   senna  1 tablet Oral QHS   Continuous Infusions:  sodium chloride Stopped (02/02/22 0929)   PRN  Meds:.acetaminophen, calcium carbonate, iohexol, melatonin, ondansetron (ZOFRAN) IV   I have personally reviewed following labs and imaging studies  LABORATORY DATA: CBC: Recent Labs  Lab 03/05/22 2028 03/06/22 0541 03/06/22 1650 03/07/22 0729 03/08/22 0256  WBC 10.6* 9.8 14.6* 9.8 10.7*  HGB 13.4 13.1 12.5 12.9 13.2  HCT 41.1 40.4 39.1 39.2 40.4  MCV 88.2 91.0 89.7 89.1 89.0  PLT 247 238 247 257 266     Basic Metabolic Panel: Recent Labs  Lab 03/02/22 0754 03/07/22 0729  NA  --  137  K  --  4.1  CL  --  101  CO2  --  30  GLUCOSE  --  93  BUN  --  13  CREATININE 0.75 0.61  CALCIUM  --  8.7*  GFR: Estimated Creatinine Clearance: 69.4 mL/min (by C-G formula based on SCr of 0.61 mg/dL).  Liver Function Tests: No results for input(s): "AST", "ALT", "ALKPHOS", "BILITOT", "PROT", "ALBUMIN" in the last 168 hours. No results for input(s): "LIPASE", "AMYLASE" in the last 168 hours. No results for input(s): "AMMONIA" in the last 168 hours.  Coagulation Profile: No results for input(s): "INR", "PROTIME" in the last 168 hours.  Cardiac Enzymes: No results for input(s): "CKTOTAL", "CKMB", "CKMBINDEX", "TROPONINI" in the last 168 hours.  BNP (last 3 results) No results for input(s): "PROBNP" in the last 8760 hours.  Lipid Profile: No results for input(s): "CHOL", "HDL", "LDLCALC", "TRIG", "CHOLHDL", "LDLDIRECT" in the last 72 hours.  Thyroid Function Tests: No results for input(s): "TSH", "T4TOTAL", "FREET4", "T3FREE", "THYROIDAB" in the last 72 hours.  Anemia Panel: No results for input(s): "VITAMINB12", "FOLATE", "FERRITIN", "TIBC", "IRON", "RETICCTPCT" in the last 72 hours.  Urine analysis:    Component Value Date/Time   COLORURINE YELLOW 01/30/2022 1720   APPEARANCEUR CLEAR 01/30/2022 1720   LABSPEC 1.044 (H) 01/30/2022 1720   PHURINE 5.0 01/30/2022 1720   GLUCOSEU NEGATIVE 01/30/2022 1720   HGBUR SMALL (A) 01/30/2022 1720   BILIRUBINUR NEGATIVE  01/30/2022 1720   KETONESUR 20 (A) 01/30/2022 1720   PROTEINUR 30 (A) 01/30/2022 1720   NITRITE POSITIVE (A) 01/30/2022 1720   LEUKOCYTESUR SMALL (A) 01/30/2022 1720    Sepsis Labs: Lactic Acid, Venous    Component Value Date/Time   LATICACIDVEN 1.7 02/03/2022 2101    MICROBIOLOGY: No results found for this or any previous visit (from the past 240 hour(s)).  RADIOLOGY STUDIES/RESULTS: No results found.   LOS: 30 days   Oren Binet, MD  [facility]    To contact the attending provider between 7A-7P or the covering provider during after hours 7P-7A, please log into the web site [email or web address] and access using universal [place] password for that web site. If you do not have the password, please call the hospital operator.  03/08/2022, 11:25 AM

## 2022-03-08 NOTE — Plan of Care (Signed)
  Problem: Tissue Perfusion: Goal: Adequacy of tissue perfusion will improve Outcome: Progressing   Problem: Nutrition: Goal: Adequate nutrition will be maintained Outcome: Progressing

## 2022-03-08 NOTE — Progress Notes (Signed)
ANTICOAGULATION CONSULT NOTE - Follow-Up Consult  Pharmacy Consult for Heparin Indication: pulmonary embolus  Allergies  Allergen Reactions   Penicillins Anaphylaxis    Patient Measurements: Height: '5\' 5"'$  (165.1 cm) Weight: 71.2 kg (156 lb 15.5 oz) IBW/kg (Calculated) : 57 Heparin Dosing Weight: 70 kg  Vital Signs: Temp: 98.3 F (36.8 C) (07/09 0404) BP: 141/86 (07/09 0404) Pulse Rate: 58 (07/09 0404)  Labs: Recent Labs    03/06/22 1650 03/06/22 2329 03/07/22 0729 03/08/22 0256  HGB 12.5  --  12.9 13.2  HCT 39.1  --  39.2 40.4  PLT 247  --  257 266  HEPARINUNFRC  --  0.34 0.35 0.33  CREATININE  --   --  0.61  --      Estimated Creatinine Clearance: 69.4 mL/min (by C-G formula based on SCr of 0.61 mg/dL).   Medical History: Past Medical History:  Diagnosis Date   Fall    Hard of hearing     Assessment: 66 yo female admitted over a month ago with submassive PE. Previously anticoagulated with heparin, but stopped due to retroperitoneal bleed. Has been on VTE prophylactic lovenox since 02/09/22. D/W Dr. Sloan Leiter who wants to re-challenge patient with therapeutic heparin gently and monitor for rebleed. No bolus and aim for low therapeutic end of range.   No s/sx of bleeding noted. CBC stable this morning.  Heparin level therapeutic at 0.33.   Goal of Therapy:  Heparin level 0.3-0.5 Monitor platelets by anticoagulation protocol: Yes   Plan:  Continue heparin infusion rate to 750 units/hr CBC q12h for now per MD Daily heparin level Monitor for bleeding  Francena Hanly, Pharm.D. [place] PGY1 Pharmacy Resident   03/08/2022 7:20 AM

## 2022-03-09 DIAGNOSIS — D62 Acute posthemorrhagic anemia: Secondary | ICD-10-CM | POA: Diagnosis not present

## 2022-03-09 DIAGNOSIS — I2609 Other pulmonary embolism with acute cor pulmonale: Secondary | ICD-10-CM | POA: Diagnosis not present

## 2022-03-09 LAB — TSH: TSH: 1.135 u[IU]/mL (ref 0.350–4.500)

## 2022-03-09 LAB — CBC
HCT: 40 % (ref 36.0–46.0)
Hemoglobin: 12.7 g/dL (ref 12.0–15.0)
MCH: 28.5 pg (ref 26.0–34.0)
MCHC: 31.8 g/dL (ref 30.0–36.0)
MCV: 89.7 fL (ref 80.0–100.0)
Platelets: 317 10*3/uL (ref 150–400)
RBC: 4.46 MIL/uL (ref 3.87–5.11)
RDW: 19.6 % — ABNORMAL HIGH (ref 11.5–15.5)
WBC: 10.9 10*3/uL — ABNORMAL HIGH (ref 4.0–10.5)
nRBC: 0 % (ref 0.0–0.2)

## 2022-03-09 LAB — RPR: RPR Ser Ql: NONREACTIVE

## 2022-03-09 NOTE — Progress Notes (Signed)
Occupational Therapy Treatment Patient Details Name: Jennifer Pacheco MRN: [id number] DOB: 02-09-1956 Today's Date: 03/09/2022   History of present illness Pt is 66 yo female who was found down in her home and taken to [facility]. 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 Pt with retroperitoneal bleed and IVC filter placed 6/5. PMH: DM2, HTN.   OT comments  Pt with lunch tray at bedside and asking to eat. Total assist to position pt prior to placing bed in chair position to self feed. Pt needing hand over hand assist to stabilize pudding cup with L hand, but demonstrated ability to feed finger food with L hand. Pt highly focused on needing to go to the pharmacy to pick up her blood thinner and strong dislike of the hospital food, declined further activity after eating.    Recommendations for follow up therapy are one component of a multi-disciplinary discharge planning process, led by the attending physician.  Recommendations may be updated based on patient status, additional functional criteria and insurance authorization.    Follow Up Recommendations  Skilled nursing-short term rehab (<3 hours/day)    Assistance Recommended at Discharge Frequent or constant Supervision/Assistance  Patient can return home with the following  A lot of help with bathing/dressing/bathroom;Assistance with cooking/housework;Direct supervision/assist for medications management;Direct supervision/assist for financial management;Assist for transportation;Help with stairs or ramp for entrance;Two people to help with walking and/or transfers   Equipment Recommendations  Other (comment) (defer to next venue)    Recommendations for Other Services      Precautions / Restrictions Precautions Precautions: Fall Precaution Comments: HOH       Mobility Bed Mobility Overal bed mobility: Needs Assistance             General bed mobility comments: total assist to position  pt in bed for safe eating    Transfers                         Balance                                           ADL either performed or assessed with clinical judgement   ADL Overall ADL's : Needs assistance/impaired Eating/Feeding: Minimal assistance;Bed level Eating/Feeding Details (indicate cue type and reason): assisted to open containers, worked on using L UE to stabilize pudding cup while scooping with L, self fed with L hand finger foods                                        Extremity/Trunk Assessment              Vision       Perception     Praxis      Cognition Arousal/Alertness: Awake/alert Behavior During Therapy: Flat affect Overall Cognitive Status: Impaired/Different from baseline Area of Impairment: Orientation, Attention, Memory, Following commands, Safety/judgement, Awareness, Problem solving                 Orientation Level: Disoriented to, Place, Time, Situation Current Attention Level: Sustained Memory: Decreased recall of precautions, Decreased short-term memory Following Commands: Follows one step commands with increased time, Follows one step commands inconsistently Safety/Judgement: Decreased awareness of safety, Decreased awareness of deficits Awareness: Intellectual  Problem Solving: Difficulty sequencing, Requires verbal cues, Requires tactile cues, Slow processing, Decreased initiation General Comments: pt focused on how bad the food tastes and need to go to the pharmacy        Exercises      Shoulder Instructions       General Comments      Pertinent Vitals/ Pain       Pain Assessment Pain Assessment: Faces Faces Pain Scale: Hurts even more Pain Location: L LE Pain Descriptors / Indicators: Discomfort, Grimacing, Guarding Pain Intervention(s): Monitored during session, Repositioned  Home Living                                          Prior  Functioning/Environment              Frequency  Min 2X/week        Progress Toward Goals  OT Goals(current goals can now be found in the care plan section)  Progress towards OT goals: Progressing toward goals  Acute Rehab OT Goals OT Goal Formulation: With patient Time For Goal Achievement: 03/17/22 Potential to Achieve Goals: [place] Discharge plan remains appropriate    Co-evaluation                 AM-PAC OT "6 Clicks" Daily Activity     Outcome Measure   Help from another person eating meals?: A Little Help from another person taking care of personal grooming?: A Little Help from another person toileting, which includes using toliet, bedpan, or urinal?: A Lot Help from another person bathing (including washing, rinsing, drying)?: A Lot Help from another person to put on and taking off regular upper body clothing?: A Little Help from another person to put on and taking off regular lower body clothing?: A Lot 6 Click Score: 15    End of Session    OT Visit Diagnosis: Unsteadiness on feet (R26.81);Other abnormalities of gait and mobility (R26.89);History of falling (Z91.81);Other symptoms and signs involving cognitive function   Activity Tolerance Patient tolerated treatment well   Patient Left in bed;with call bell/phone within reach;with bed alarm set   Nurse Communication          Time: 5929-2446 OT Time Calculation (min): 20 min  Charges: OT General Charges $OT Visit: 1 Visit OT Treatments $Self Care/Home Management : 8-22 mins  Cleta Alberts, OTR/L Acute Rehabilitation Services Office: [phone]  Malka So 03/09/2022, 2:51 PM

## 2022-03-09 NOTE — Plan of Care (Signed)
  Problem: Clinical Measurements: Goal: Respiratory complications will improve Outcome: Progressing Goal: Cardiovascular complication will be avoided Outcome: Progressing   Problem: Elimination: Goal: Will not experience complications related to bowel motility Outcome: Progressing Goal: Will not experience complications related to urinary retention Outcome: Progressing

## 2022-03-09 NOTE — Progress Notes (Signed)
PROGRESS NOTE        PATIENT DETAILS Name: Jennifer Pacheco Age: 66 y.o. Sex: female Date of Birth: Sep 13, 1955 Admit Date: 01/30/2022 Admitting Physician Collier Bullock, MD ANV:BTYOMA, Brigitte Pulse, NP  Brief Summary: Patient is a 66 y.o.  female with history of DM-2, HTN who presented to [facility] ED on 6/2 after she was found confused/down in feces/urine-she was hypotensive/hypoxic-she was found to have a submassive PE-CT head showed right basal ganglia mass with mass effect-she was transferred to [place] she was monitored and started on anticoagulation.  Unfortunately further hospital course was complicated by development of a retroperitoneal bleed and acute blood loss anemia.  Heparin was discontinued-IVC filter was placed-and upon further stability-patient was transferred to [facility].  Upon further neuroimaging studies-brain mass was consistent with a meningioma.  Further hospital course was complicated by persistent cognitive dysfunction (likely Proofreader working on guardianship) and development of significant LLE swelling with massive clot burden (after discussion with vascular-rechallenged with anticoagulation).  Currently remains medically stable-and awaiting SNF/guardianship process.  Significant studies: 6/2>> CTA chest: PE with CT evidence of RV strain. 6/2>> CT abdomen/pelvis: Cholelithiasis, CT findings of left pyelonephritis, esophageal/patulous/thickened distally 6/2>> CT head: Large hypodense/irregular mass in the right basal ganglia/right insula-with surrounding edema. 6/3>> RUQ ultrasound: Cholelithiasis/mild pericholecystic fluid/mild gallbladder wall thickening. 6/3>> MRI brain no contrast: 5.2 x 5.3 cm mass on the right-with mass effect and vasogenic edema 6/3>> Echo: EF 00-45%, RV systolic function normal 9/9>> CT angio GI bleed: No areas of active extravasation-large left-sided retroperitoneal hematoma. 6/14>> MRI brain with/without  contrast: 5.9 x 5.4 x 5.9 enhancing mass in the right frontotemporal/periinsular region-favored extra-axial in origin and favored to reflect a meningioma. 6/23>> CT head: Large meningioma involving the right frontotemporal region with associated vasogenic edema/regional mass effect 7/5>> acute DVT involving left common iliac, external iliac, common femoral, left femoral, left proximal profunda, left popliteal, left posterior tibial, left peroneal, left gastrocnemius   Procedures: 6/5>> IVC filter by IR  Consults: PCCM, neurosurgery, GI, IR, palliative care, vascular surgery  Subjective: Lying comfortably in bed-no major issues overnight-remains confused-thinking she can walk over to the pharmacy and get her medications.  Objective: Vitals: Blood pressure 93/83, pulse 95, temperature 97.9 F (36.6 C), temperature source Oral, resp. rate 16, height '5\' 5"'$  (1.651 m), weight 68.9 kg, SpO2 95 %.   Exam: Gen Exam:Alert awake-not in any distress HEENT:atraumatic, normocephalic Chest: B/L clear to auscultation anteriorly CVS:S1S2 regular Abdomen:soft non tender, non distended Extremities: LLE swelling persists. Neurology: Non focal Skin: no rash   Pertinent Labs/Radiology:    Latest Ref Rng & Units 03/09/2022   12:34 AM 03/08/2022    2:56 AM 03/07/2022    7:29 AM  CBC  WBC 4.0 - 10.5 K/uL 10.9  10.7  9.8   Hemoglobin 12.0 - 15.0 g/dL 12.7  13.2  12.9   Hematocrit 36.0 - 46.0 % 40.0  40.4  39.2   Platelets 150 - 400 K/uL 317  266  257     Lab Results  Component Value Date   NA 137 03/07/2022   K 4.1 03/07/2022   CL 101 03/07/2022   CO2 30 03/07/2022      Assessment/Plan: Submassive PE with acute cor pulmonale: Did not tolerate IV heparin on initial presentation and developed a retroperitoneal bleed-IVC filter placed.  Unfortunately over the past several days has  developed a significant DVT in LLE-see below.  LLE DVT: Significant clot burden in LLE on Doppler ultrasound on  7/5-difficult situation-has IVC filter in place-discussed with vascular surgery Dr. Stanford Breed on 7/6-not a candidate for thrombectomy-recommendations were to rechallenge with IV heparin-which tolerated well for 72 hours-and hence was transitioned to Eliquis on 7/9.  Repeat CBC on 7/10 remained stable.  We will plan on checking CBC periodically-at least once a week for the next few weeks.   Acute blood loss anemia due to retroperitoneal bleed: Required several units of PRBC transfusion-hemoglobin stable even after anticoagulation rechallenge.  Follow periodically.    Acute metabolic encephalopathy: Multifactorial-due to acute illness/brain mass-suspicion for some underlying cognitive issue (per prior note-estranged from family-does strange things-fails to recognize her brother at times). Apparently has some impaired hearing which could contribute.  Pleasantly confused-vitamin B12/TSH/RPR all stable.   Family not willing to participate in her care-guardianship process in progress.  Large right basal ganglia mass with surrounding edema/11 mm midline shift: Continues to have left hemiparesis-on steroids-Per prior notes-now thought to be a meningioma-neurosurgery recommending outpatient follow-up in 2-3 months for reevaluation/craniotomy.  Spoke with neurosurgeon-Dr. Dawley on 7/6-no contraindication for anticoagulation-suggest that we slowly taper off Decadron in 2 weeks.  Multifactorial shock-due to hypovolemia/submassive PE/septic shock (pyelonephritis): BP stable-hemoglobin stable-no longer on antibiotics for  Acute hypoxic respiratory failure: Resolved-due to PE-on room air.  Distal esophageal thickening: Noted on CT-evaluated by GI-recommendations are for PPI-EGD deferred for now-could be possibly done in the outpatient setting.  DM-2 (A1c 5.5 on 6/2): CBG stable-hold metformin for now.  Recent Labs    03/08/22 0737  GLUCAP 103*     Deconditioning: Evaluated by therapy-recommendations are for  SNF.  Other issues: APS involved-social work following-guardianship process underway.  Discussed with social work Arbie Cookey on 7/6-no family member wants to make any decisions-guardianship process underway.   Pressure Ulcer: Pressure Injury 01/30/22 Sacrum Right;Medial Stage 2 -  Partial thickness loss of dermis presenting as a shallow open injury with a red, pink wound bed without slough. (Active)  01/30/22 2053  Location: Sacrum  Location Orientation: Right;Medial  Staging: Stage 2 -  Partial thickness loss of dermis presenting as a shallow open injury with a red, pink wound bed without slough.  Wound Description (Comments):   Present on Admission: Yes  Dressing Type Foam - Lift dressing to assess site every shift 03/09/22 1030     Pressure Injury 01/30/22 Coccyx Lower;Medial Stage 2 -  Partial thickness loss of dermis presenting as a shallow open injury with a red, pink wound bed without slough. (Active)  01/30/22 2053  Location: Coccyx  Location Orientation: Lower;Medial  Staging: Stage 2 -  Partial thickness loss of dermis presenting as a shallow open injury with a red, pink wound bed without slough.  Wound Description (Comments):   Present on Admission: Yes  Dressing Type Foam - Lift dressing to assess site every shift 03/09/22 1030    BMI: Estimated body mass index is 25.28 kg/m as calculated from the following:   Height as of this encounter: '5\' 5"'$  (1.651 m).   Weight as of this encounter: 68.9 kg.   Code status:   Code Status: Full Code   DVT Prophylaxis: Place and maintain sequential compression device Start: 02/02/22 1450 apixaban (ELIQUIS) tablet 5 mg   IV heparin.   Family Communication: None at [place] for patient's friend-[phone] on 7/6.  Per social work-no family member wants to make decisions.   Disposition Plan: Status is: Inpatient  Remains inpatient appropriate because: Awaiting guardianship process-SNF placement.   Planned Discharge  Destination:Skilled nursing facility   Diet: Diet Order             DIET DYS 3 Room service appropriate? Yes; Fluid consistency: Thin  Diet effective now                     Antimicrobial agents: Anti-infectives (From admission, onward)    Start     Dose/Rate Route Frequency Ordered Stop   02/03/22 1715  levofloxacin (LEVAQUIN) tablet 500 mg  Status:  Discontinued        500 mg Oral Daily 02/03/22 1627 02/05/22 1355   02/02/22 2045  levofloxacin (LEVAQUIN) IVPB 500 mg  Status:  Discontinued       Note to Pharmacy: Adjust as needed - switching to IV since unable to take PO   500 mg 100 mL/hr over 60 Minutes Intravenous Every 24 hours 02/02/22 1959 02/03/22 1627   02/01/22 1400  levofloxacin (LEVAQUIN) tablet 500 mg  Status:  Discontinued        500 mg Oral Daily 02/01/22 0911 02/02/22 1959   01/31/22 1000  vancomycin (VANCOREADY) IVPB 750 mg/150 mL  Status:  Discontinued        750 mg 150 mL/hr over 60 Minutes Intravenous Every 12 hours 01/31/22 0003 01/31/22 0848   01/31/22 0200  vancomycin (VANCOREADY) IVPB 1500 mg/300 mL  Status:  Discontinued        1,500 mg 150 mL/hr over 120 Minutes Intravenous Every 12 hours 01/30/22 1241 01/30/22 2348   01/30/22 2100  aztreonam (AZACTAM) 2 g in sodium chloride 0.9 % 100 mL IVPB  Status:  Discontinued        2 g 200 mL/hr over 30 Minutes Intravenous Every 8 hours 01/30/22 1240 02/01/22 0923   01/30/22 1245  aztreonam (AZACTAM) 2 g in sodium chloride 0.9 % 100 mL IVPB        2 g 200 mL/hr over 30 Minutes Intravenous  Once 01/30/22 1231 01/30/22 1408   01/30/22 1245  metroNIDAZOLE (FLAGYL) IVPB 500 mg        500 mg 100 mL/hr over 60 Minutes Intravenous  Once 01/30/22 1231 01/30/22 1408   01/30/22 1245  vancomycin (VANCOCIN) IVPB 1000 mg/200 mL premix  Status:  Discontinued        1,000 mg 200 mL/hr over 60 Minutes Intravenous  Once 01/30/22 1231 01/30/22 1239   01/30/22 1245  vancomycin (VANCOREADY) IVPB 2000 mg/400 mL        2,000  mg 200 mL/hr over 120 Minutes Intravenous  Once 01/30/22 1239 01/30/22 1607        MEDICATIONS: Scheduled Meds:  apixaban  5 mg Oral BID   dexamethasone  3 mg Oral Daily   docusate sodium  100 mg Oral BID   gabapentin  300 mg Oral BID   pantoprazole  40 mg Oral BID   polyethylene glycol  17 g Oral BID   senna  1 tablet Oral QHS   Continuous Infusions:  sodium chloride Stopped (02/02/22 0929)   PRN Meds:.acetaminophen, calcium carbonate, iohexol, melatonin, ondansetron (ZOFRAN) IV   I have personally reviewed following labs and imaging studies  LABORATORY DATA: CBC: Recent Labs  Lab 03/06/22 0541 03/06/22 1650 03/07/22 0729 03/08/22 0256 03/09/22 0034  WBC 9.8 14.6* 9.8 10.7* 10.9*  HGB 13.1 12.5 12.9 13.2 12.7  HCT 40.4 39.1 39.2 40.4 40.0  MCV 91.0 89.7 89.1 89.0 89.7  PLT 238  247 257 266 317     Basic Metabolic Panel: Recent Labs  Lab 03/07/22 0729  NA 137  K 4.1  CL 101  CO2 30  GLUCOSE 93  BUN 13  CREATININE 0.61  CALCIUM 8.7*     GFR: Estimated Creatinine Clearance: 68.4 mL/min (by C-G formula based on SCr of 0.61 mg/dL).  Liver Function Tests: No results for input(s): "AST", "ALT", "ALKPHOS", "BILITOT", "PROT", "ALBUMIN" in the last 168 hours. No results for input(s): "LIPASE", "AMYLASE" in the last 168 hours. No results for input(s): "AMMONIA" in the last 168 hours.  Coagulation Profile: No results for input(s): "INR", "PROTIME" in the last 168 hours.  Cardiac Enzymes: No results for input(s): "CKTOTAL", "CKMB", "CKMBINDEX", "TROPONINI" in the last 168 hours.  BNP (last 3 results) No results for input(s): "PROBNP" in the last 8760 hours.  Lipid Profile: No results for input(s): "CHOL", "HDL", "LDLCALC", "TRIG", "CHOLHDL", "LDLDIRECT" in the last 72 hours.  Thyroid Function Tests: Recent Labs    03/09/22 0034  TSH 1.135    Anemia Panel: No results for input(s): "VITAMINB12", "FOLATE", "FERRITIN", "TIBC", "IRON", "RETICCTPCT"  in the last 72 hours.  Urine analysis:    Component Value Date/Time   COLORURINE YELLOW 01/30/2022 1720   APPEARANCEUR CLEAR 01/30/2022 1720   LABSPEC 1.044 (H) 01/30/2022 1720   PHURINE 5.0 01/30/2022 1720   GLUCOSEU NEGATIVE 01/30/2022 1720   HGBUR SMALL (A) 01/30/2022 1720   BILIRUBINUR NEGATIVE 01/30/2022 1720   KETONESUR 20 (A) 01/30/2022 1720   PROTEINUR 30 (A) 01/30/2022 1720   NITRITE POSITIVE (A) 01/30/2022 1720   LEUKOCYTESUR SMALL (A) 01/30/2022 1720    Sepsis Labs: Lactic Acid, Venous    Component Value Date/Time   LATICACIDVEN 1.7 02/03/2022 2101    MICROBIOLOGY: No results found for this or any previous visit (from the past 240 hour(s)).  RADIOLOGY STUDIES/RESULTS: No results found.   LOS: 30 days   Oren Binet, MD  [facility]    To contact the attending provider between 7A-7P or the covering provider during after hours 7P-7A, please log into the web site [email or web address] and access using universal [place] password for that web site. If you do not have the password, please call the hospital operator.  03/09/2022, 11:21 AM

## 2022-03-09 NOTE — TOC Progression Note (Signed)
Transition of Care [facility]) - Progression Note    Patient Details  Name: Jalayna Josten MRN: [id number] Date of Birth: July 08, 1956  [facility] CM/SW Contact  Curlene Labrum, RN Phone Number: 03/09/2022, 1:52 PM  Clinical Narrative:    CM and MSW with DTP Team will continue to follow the patient for SNF placement - post guardianship hearing scheduled for 03/26/2022 at 10 am per Shon Baton, MSW.   Expected Discharge Plan: (P) Skilled Nursing Facility Barriers to Discharge: (P) Continued Medical Work up, Unsafe home situation, Family Issues  Expected Discharge Plan and Services Expected Discharge Plan: (P) [place] In-house Referral: Clinical Social Work, Hospice / [place] Acute Care Choice: [place] Living arrangements for the past 2 months: Single Family Home                                       Social Determinants of Health (SDOH) Interventions    Readmission Risk Interventions    02/11/2022    2:16 PM  Readmission Risk Prevention Plan  Transportation Screening Complete  PCP or Specialist Appt within 5-7 Days Complete  Home Care Screening Complete  Medication Review (RN CM) Complete

## 2022-03-09 NOTE — Progress Notes (Signed)
Mobility Specialist Criteria Algorithm Info.   03/09/22 1125  Mobility  Activity Turned to right side;Moved into chair position in bed (Bed level AROM)  Range of Motion/Exercises Active;Passive  Level of Assistance Moderate assist, patient does 50-74% (Mod Cues)  Assistive Device None   Patient received in supine asleep but arousal to verbal stimulus. A&O to self only and unwilling to participate in mobility initially. Was able to convince pt to reposition and participate UE AROM. Required max A to turn and max physical cues for AROM.  Tolerated without incident. Was left in supine with all needs met, call bell in reach.   03/09/2022 11:25 AM  [place] [address], [place], [place]  LRJPV:[phone] Office: [phone]

## 2022-03-10 DIAGNOSIS — G9389 Other specified disorders of brain: Secondary | ICD-10-CM | POA: Diagnosis not present

## 2022-03-10 DIAGNOSIS — I2609 Other pulmonary embolism with acute cor pulmonale: Secondary | ICD-10-CM | POA: Diagnosis not present

## 2022-03-10 DIAGNOSIS — Z515 Encounter for palliative care: Secondary | ICD-10-CM | POA: Diagnosis not present

## 2022-03-10 DIAGNOSIS — R410 Disorientation, unspecified: Secondary | ICD-10-CM | POA: Diagnosis not present

## 2022-03-10 NOTE — Progress Notes (Signed)
PROGRESS NOTE        PATIENT DETAILS Name: Jennifer Pacheco Age: 66 y.o. Sex: female Date of Birth: [birth year] Admit Date: 01/30/2022 Admitting Physician Collier Bullock, MD YOV:ZCHYIF, Brigitte Pulse, NP  Brief Summary: Patient is a 66 y.o.  female with history of DM-2, HTN who presented to [facility] ED on 6/2 after she was found confused/down in feces/urine-she was hypotensive/hypoxic-she was found to have a submassive PE-CT head showed right basal ganglia mass with mass effect-she was transferred to [place] she was monitored and started on anticoagulation.  Unfortunately further hospital course was complicated by development of a retroperitoneal bleed and acute blood loss anemia.  Heparin was discontinued-IVC filter was placed-and upon further stability-patient was transferred to [facility].  Upon further neuroimaging studies-brain mass was consistent with a meningioma.  Further hospital course was complicated by persistent cognitive dysfunction (likely Proofreader working on guardianship) and development of significant LLE swelling with massive clot burden (after discussion with vascular-rechallenged with anticoagulation).  Currently remains medically stable-and awaiting SNF/guardianship process.  Significant studies: 6/2>> CTA chest: PE with CT evidence of RV strain. 6/2>> CT abdomen/pelvis: Cholelithiasis, CT findings of left pyelonephritis, esophageal/patulous/thickened distally 6/2>> CT head: Large hypodense/irregular mass in the right basal ganglia/right insula-with surrounding edema. 6/3>> RUQ ultrasound: Cholelithiasis/mild pericholecystic fluid/mild gallbladder wall thickening. 6/3>> MRI brain no contrast: 5.2 x 5.3 cm mass on the right-with mass effect and vasogenic edema 6/3>> Echo: EF 02-77%, RV systolic function normal 4/1>> CT angio GI bleed: No areas of active extravasation-large left-sided retroperitoneal hematoma. 6/14>> MRI brain with/without  contrast: 5.9 x 5.4 x 5.9 enhancing mass in the right frontotemporal/periinsular region-favored extra-axial in origin and favored to reflect a meningioma. 6/23>> CT head: Large meningioma involving the right frontotemporal region with associated vasogenic edema/regional mass effect 7/5>> acute DVT involving left common iliac, external iliac, common femoral, left femoral, left proximal profunda, left popliteal, left posterior tibial, left peroneal, left gastrocnemius   Procedures: 6/5>> IVC filter by IR  Consults: PCCM, neurosurgery, GI, IR, palliative care, vascular surgery  Subjective: Pleasantly confused-no major issues overnight.  Objective: Vitals: Blood pressure 102/68, pulse 77, temperature 97.6 F (36.4 C), resp. rate 20, height '5\' 5"'$  (1.651 m), weight 68.9 kg, SpO2 95 %.   Exam: Gen Exam:Alert awake-not in any distress HEENT:atraumatic, normocephalic Chest: B/L clear to auscultation anteriorly CVS:S1S2 regular Abdomen:soft non tender, non distended Extremities: LLE swelling persist-unchanged since yesterday. Neurology: Non focal Skin: no rash   Pertinent Labs/Radiology:    Latest Ref Rng & Units 03/09/2022   12:34 AM 03/08/2022    2:56 AM 03/07/2022    7:29 AM  CBC  WBC 4.0 - 10.5 K/uL 10.9  10.7  9.8   Hemoglobin 12.0 - 15.0 g/dL 12.7  13.2  12.9   Hematocrit 36.0 - 46.0 % 40.0  40.4  39.2   Platelets 150 - 400 K/uL 317  266  257     Lab Results  Component Value Date   NA 137 03/07/2022   K 4.1 03/07/2022   CL 101 03/07/2022   CO2 30 03/07/2022      Assessment/Plan: Submassive PE with acute cor pulmonale: Did not tolerate IV heparin on initial presentation and developed a retroperitoneal bleed-IVC filter placed.  Unfortunately over the past several days has developed a significant DVT in LLE-see below.  LLE DVT: Significant clot burden in LLE  on Doppler ultrasound on 7/5-difficult situation-has IVC filter in place-discussed with vascular surgery Dr. Stanford Breed on  7/6-not a candidate for thrombectomy-recommendations were to rechallenge with IV heparin-which she tolerated well for 72 hours-and hence was transitioned to Eliquis on 7/9.  Repeat CBC on 7/10 remained stable.  We will plan on checking CBC periodically-at least once a week for the next few weeks.   Acute blood loss anemia due to retroperitoneal bleed: Required several units of PRBC transfusion-hemoglobin stable even after anticoagulation rechallenge.  Follow periodically.    Acute metabolic encephalopathy: Multifactorial-due to acute illness/brain mass-suspicion for some underlying cognitive issue (per prior note-estranged from family-does strange things-fails to recognize her brother at times). Apparently has some impaired hearing which could contribute.  Pleasantly confused-vitamin B12/TSH/RPR all stable.   Family not willing to participate in her care-guardianship process in progress.  Large right basal ganglia mass with surrounding edema/11 mm midline shift: Continues to have left hemiparesis-on steroids-Per prior notes-now thought to be a meningioma-neurosurgery recommending outpatient follow-up in 2-3 months for reevaluation/craniotomy.  Spoke with neurosurgeon-Dr. Dawley on 7/6-no contraindication for anticoagulation-suggest that we slowly taper off Decadron in 2 weeks.  Multifactorial shock-due to hypovolemia/submassive PE/septic shock (pyelonephritis): BP stable-hemoglobin stable-no longer on antibiotics for  Acute hypoxic respiratory failure: Resolved-due to PE-on room air.  Distal esophageal thickening: Noted on CT-evaluated by GI-recommendations are for PPI-EGD deferred for now-could be possibly done in the outpatient setting.  DM-2 (A1c 5.5 on 6/2): CBG stable-hold metformin for now.  Recent Labs    03/08/22 0737  GLUCAP 103*     Deconditioning: Evaluated by therapy-recommendations are for SNF.  Other issues: APS involved-social work following-guardianship process underway.   Discussed with social work Arbie Cookey on 7/6-no family member wants to make any decisions-guardianship process underway.   Pressure Ulcer: Pressure Injury 01/30/22 Sacrum Right;Medial Stage 2 -  Partial thickness loss of dermis presenting as a shallow open injury with a red, pink wound bed without slough. (Active)  01/30/22 2053  Location: Sacrum  Location Orientation: Right;Medial  Staging: Stage 2 -  Partial thickness loss of dermis presenting as a shallow open injury with a red, pink wound bed without slough.  Wound Description (Comments):   Present on Admission: Yes  Dressing Type Foam - Lift dressing to assess site every shift 03/09/22 2000     Pressure Injury 01/30/22 Coccyx Lower;Medial Stage 2 -  Partial thickness loss of dermis presenting as a shallow open injury with a red, pink wound bed without slough. (Active)  01/30/22 2053  Location: Coccyx  Location Orientation: Lower;Medial  Staging: Stage 2 -  Partial thickness loss of dermis presenting as a shallow open injury with a red, pink wound bed without slough.  Wound Description (Comments):   Present on Admission: Yes  Dressing Type Foam - Lift dressing to assess site every shift 03/09/22 1030    BMI: Estimated body mass index is 25.28 kg/m as calculated from the following:   Height as of this encounter: '5\' 5"'$  (1.651 m).   Weight as of this encounter: 68.9 kg.   Code status:   Code Status: Full Code   DVT Prophylaxis: Place and maintain sequential compression device Start: 02/02/22 1450 apixaban (ELIQUIS) tablet 5 mg   IV heparin.   Family Communication: None at [place] for patient's friend-[phone] on 7/6.  Per social work-no family member wants to make decisions.   Disposition Plan: Status is: Inpatient Remains inpatient appropriate because: Awaiting guardianship process-SNF placement.   Planned Discharge Destination:Skilled nursing  facility   Diet: Diet Order             DIET DYS 3  Room service appropriate? Yes; Fluid consistency: Thin  Diet effective now                     Antimicrobial agents: Anti-infectives (From admission, onward)    Start     Dose/Rate Route Frequency Ordered Stop   02/03/22 1715  levofloxacin (LEVAQUIN) tablet 500 mg  Status:  Discontinued        500 mg Oral Daily 02/03/22 1627 02/05/22 1355   02/02/22 2045  levofloxacin (LEVAQUIN) IVPB 500 mg  Status:  Discontinued       Note to Pharmacy: Adjust as needed - switching to IV since unable to take PO   500 mg 100 mL/hr over 60 Minutes Intravenous Every 24 hours 02/02/22 1959 02/03/22 1627   02/01/22 1400  levofloxacin (LEVAQUIN) tablet 500 mg  Status:  Discontinued        500 mg Oral Daily 02/01/22 0911 02/02/22 1959   01/31/22 1000  vancomycin (VANCOREADY) IVPB 750 mg/150 mL  Status:  Discontinued        750 mg 150 mL/hr over 60 Minutes Intravenous Every 12 hours 01/31/22 0003 01/31/22 0848   01/31/22 0200  vancomycin (VANCOREADY) IVPB 1500 mg/300 mL  Status:  Discontinued        1,500 mg 150 mL/hr over 120 Minutes Intravenous Every 12 hours 01/30/22 1241 01/30/22 2348   01/30/22 2100  aztreonam (AZACTAM) 2 g in sodium chloride 0.9 % 100 mL IVPB  Status:  Discontinued        2 g 200 mL/hr over 30 Minutes Intravenous Every 8 hours 01/30/22 1240 02/01/22 0923   01/30/22 1245  aztreonam (AZACTAM) 2 g in sodium chloride 0.9 % 100 mL IVPB        2 g 200 mL/hr over 30 Minutes Intravenous  Once 01/30/22 1231 01/30/22 1408   01/30/22 1245  metroNIDAZOLE (FLAGYL) IVPB 500 mg        500 mg 100 mL/hr over 60 Minutes Intravenous  Once 01/30/22 1231 01/30/22 1408   01/30/22 1245  vancomycin (VANCOCIN) IVPB 1000 mg/200 mL premix  Status:  Discontinued        1,000 mg 200 mL/hr over 60 Minutes Intravenous  Once 01/30/22 1231 01/30/22 1239   01/30/22 1245  vancomycin (VANCOREADY) IVPB 2000 mg/400 mL        2,000 mg 200 mL/hr over 120 Minutes Intravenous  Once 01/30/22 1239 01/30/22 1607         MEDICATIONS: Scheduled Meds:  apixaban  5 mg Oral BID   dexamethasone  3 mg Oral Daily   docusate sodium  100 mg Oral BID   gabapentin  300 mg Oral BID   pantoprazole  40 mg Oral BID   polyethylene glycol  17 g Oral BID   senna  1 tablet Oral QHS   Continuous Infusions:  sodium chloride Stopped (02/02/22 0929)   PRN Meds:.acetaminophen, calcium carbonate, iohexol, melatonin, ondansetron (ZOFRAN) IV   I have personally reviewed following labs and imaging studies  LABORATORY DATA: CBC: Recent Labs  Lab 03/06/22 0541 03/06/22 1650 03/07/22 0729 03/08/22 0256 03/09/22 0034  WBC 9.8 14.6* 9.8 10.7* 10.9*  HGB 13.1 12.5 12.9 13.2 12.7  HCT 40.4 39.1 39.2 40.4 40.0  MCV 91.0 89.7 89.1 89.0 89.7  PLT 238 247 257 266 317     Basic Metabolic Panel: Recent Labs  Lab 03/07/22 0729  NA 137  K 4.1  CL 101  CO2 30  GLUCOSE 93  BUN 13  CREATININE 0.61  CALCIUM 8.7*     GFR: Estimated Creatinine Clearance: 68.4 mL/min (by C-G formula based on SCr of 0.61 mg/dL).  Liver Function Tests: No results for input(s): "AST", "ALT", "ALKPHOS", "BILITOT", "PROT", "ALBUMIN" in the last 168 hours. No results for input(s): "LIPASE", "AMYLASE" in the last 168 hours. No results for input(s): "AMMONIA" in the last 168 hours.  Coagulation Profile: No results for input(s): "INR", "PROTIME" in the last 168 hours.  Cardiac Enzymes: No results for input(s): "CKTOTAL", "CKMB", "CKMBINDEX", "TROPONINI" in the last 168 hours.  BNP (last 3 results) No results for input(s): "PROBNP" in the last 8760 hours.  Lipid Profile: No results for input(s): "CHOL", "HDL", "LDLCALC", "TRIG", "CHOLHDL", "LDLDIRECT" in the last 72 hours.  Thyroid Function Tests: Recent Labs    03/09/22 0034  TSH 1.135     Anemia Panel: No results for input(s): "VITAMINB12", "FOLATE", "FERRITIN", "TIBC", "IRON", "RETICCTPCT" in the last 72 hours.  Urine analysis:    Component Value Date/Time    COLORURINE YELLOW 01/30/2022 1720   APPEARANCEUR CLEAR 01/30/2022 1720   LABSPEC 1.044 (H) 01/30/2022 1720   PHURINE 5.0 01/30/2022 1720   GLUCOSEU NEGATIVE 01/30/2022 1720   HGBUR SMALL (A) 01/30/2022 1720   BILIRUBINUR NEGATIVE 01/30/2022 1720   KETONESUR 20 (A) 01/30/2022 1720   PROTEINUR 30 (A) 01/30/2022 1720   NITRITE POSITIVE (A) 01/30/2022 1720   LEUKOCYTESUR SMALL (A) 01/30/2022 1720    Sepsis Labs: Lactic Acid, Venous    Component Value Date/Time   LATICACIDVEN 1.7 02/03/2022 2101    MICROBIOLOGY: No results found for this or any previous visit (from the past 240 hour(s)).  RADIOLOGY STUDIES/RESULTS: No results found.   LOS: 58 days   Oren Binet, MD  [facility]    To contact the attending provider between 7A-7P or the covering provider during after hours 7P-7A, please log into the web site [email or web address] and access using universal [place] password for that web site. If you do not have the password, please call the hospital operator.  03/10/2022, 11:50 AM

## 2022-03-10 NOTE — Plan of Care (Signed)
  Problem: Education: Goal: Ability to describe self-care measures that may prevent or decrease complications (Diabetes Survival Skills Education) will improve Outcome: Progressing Goal: Individualized Educational Video(s) Outcome: Progressing   Problem: Coping: Goal: Ability to adjust to condition or change in health will improve Outcome: Progressing   Problem: Fluid Volume: Goal: Ability to maintain a balanced intake and output will improve Outcome: Progressing   Problem: Health Behavior/Discharge Planning: Goal: Ability to identify and utilize available resources and services will improve Outcome: Progressing Goal: Ability to manage health-related needs will improve Outcome: Progressing   Problem: Metabolic: Goal: Ability to maintain appropriate glucose levels will improve Outcome: Progressing   Problem: Nutritional: Goal: Maintenance of adequate nutrition will improve Outcome: Progressing Goal: Progress toward achieving an optimal weight will improve Outcome: Progressing   Problem: Skin Integrity: Goal: Risk for impaired skin integrity will decrease Outcome: Progressing   Problem: Tissue Perfusion: Goal: Adequacy of tissue perfusion will improve Outcome: Progressing   Problem: Education: Goal: Knowledge of General Education information will improve Description: Including pain rating scale, medication(s)/side effects and non-pharmacologic comfort measures Outcome: Progressing   Problem: Health Behavior/Discharge Planning: Goal: Ability to manage health-related needs will improve Outcome: Progressing   Problem: Clinical Measurements: Goal: Ability to maintain clinical measurements within normal limits will improve Outcome: Progressing Goal: Will remain free from infection Outcome: Progressing Goal: Diagnostic test results will improve Outcome: Progressing Goal: Respiratory complications will improve Outcome: Progressing Goal: Cardiovascular complication will  be avoided Outcome: Progressing   Problem: Activity: Goal: Risk for activity intolerance will decrease Outcome: Progressing   Problem: Nutrition: Goal: Adequate nutrition will be maintained Outcome: Progressing   Problem: Coping: Goal: Level of anxiety will decrease Outcome: Progressing   Problem: Elimination: Goal: Will not experience complications related to bowel motility Outcome: Progressing Goal: Will not experience complications related to urinary retention Outcome: Progressing   Problem: Pain Managment: Goal: General experience of comfort will improve Outcome: Progressing   Problem: Safety: Goal: Ability to remain free from injury will improve Outcome: Progressing   Problem: Skin Integrity: Goal: Risk for impaired skin integrity will decrease Outcome: Progressing   Problem: Education: Goal: Understanding of CV disease, CV risk reduction, and recovery process will improve Outcome: Progressing Goal: Individualized Educational Video(s) Outcome: Progressing   Problem: Activity: Goal: Ability to return to baseline activity level will improve Outcome: Progressing   Problem: Cardiovascular: Goal: Ability to achieve and maintain adequate cardiovascular perfusion will improve Outcome: Progressing Goal: Vascular access site(s) Level 0-1 will be maintained Outcome: Progressing   Problem: Health Behavior/Discharge Planning: Goal: Ability to safely manage health-related needs after discharge will improve Outcome: Progressing   Problem: Nutrition: Goal: Adequate nutrition will be maintained Outcome: Progressing   Problem: Coping: Goal: Level of anxiety will decrease Outcome: Progressing   Problem: Elimination: Goal: Will not experience complications related to bowel motility Outcome: Progressing Goal: Will not experience complications related to urinary retention Outcome: Progressing

## 2022-03-10 NOTE — Progress Notes (Signed)
Patient ID: [name], female   DOB: February 02, 1956, 66 y.o.   MRN: [id number]    Progress Note from the Palliative Medicine Team at [facility]   Patient Name: Jennifer Pacheco        Date: 03/10/2022 DOB: 10/22/1955  Age: 66 y.o. MRN#: [id number] Attending Physician: Jonetta Osgood, MD Primary Care Physician: [name], NP Admit Date: 01/30/2022   Medical records reviewed   67 y.o. female   admitted on 01/30/2022 with  PMH of HTN, DMT2 presents to [facility] ED on 6/2 with AMS.  Patient was found down on floor at home altered covered in feces and urine and someone checked on her today on 6/2.  EMS transported patient to [facility].   Unsure exactly how long she has been down.  Patient noted to be hypoxic and hypotensive.    CT chest showing submassive PE with RV/LV ratio 1.2-1.5. Started on heparin. Broad spectrum abx started. CT abd and UA concerning for UTI w/ possible pyelonephritis. CXR unremarkable.    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    Today is day 86 of this hospitalization.  Hospitalization complicated secondary to the fact the patient has no advance care planning documents or documented H POA.  She has no family or friend willing to step forward to speak on her behalf.  Patient remains without decision-making capacity.    Today [name]  is alert, appetite is good however she remains confused, although she is aware that she has a brain tumor she remains without insight or capacity for medical decisions at this time.  Conversation is nonsensical at times.  Transition of care team working with Adult Protective Services to secure guardianship.  I spoke with attending team/Dr. Sloan Leiter and Madilyn Fireman, LCSW.   PMT will sign off at this time please reconsult once guardianship is been secured.  PMT will assist in clarification of  goals of care at that time     Wadie Lessen NP  Palliative Medicine Team Team Phone # [phone] Pager [phone]

## 2022-03-11 DIAGNOSIS — I2609 Other pulmonary embolism with acute cor pulmonale: Secondary | ICD-10-CM | POA: Diagnosis not present

## 2022-03-11 MED ORDER — BOOST / RESOURCE BREEZE PO LIQD CUSTOM
1.0000 | Freq: Three times a day (TID) | ORAL | Status: DC
  Administered 2022-03-11 – 2022-04-01 (×42): 1 via ORAL

## 2022-03-11 NOTE — Progress Notes (Signed)
Physical Therapy Treatment Patient Details Name: Lilias Lorensen MRN: [id number] DOB: 03-Aug-1956 Today's Date: 03/11/2022   History of Present Illness Pt is 66 yo female who was found down in her home and taken to [facility]. 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 Pt with retroperitoneal bleed and IVC filter placed 6/5. 7/5>> acute DVT involving left common iliac, external iliac, common femoral, left femoral, left proximal profunda, left popliteal, left posterior tibial, left peroneal, left gastrocnemius PMH: DM2, HTN.    PT Comments    Treatment limited due to cognitive deficits. With heavy encouragement and frequent redirection patient participated in bed mobility and seated balance training on edge of bed. Still showing significant lean towards Rt side. More oriented today, recalling the fact that she is in [place] with a brain mass. Otherwise was very challenging to keep patient engaged in tasks. Patient will continue to benefit from skilled physical therapy services to further improve independence with functional mobility.   Recommendations for follow up therapy are one component of a multi-disciplinary discharge planning process, led by the attending physician.  Recommendations may be updated based on patient status, additional functional criteria and insurance authorization.  Follow Up Recommendations  Skilled nursing-short term rehab (<3 hours/day)     Assistance Recommended at Discharge Frequent or constant Supervision/Assistance  Patient can return home with the following Assistance with cooking/housework;Direct supervision/assist for medications management;Direct supervision/assist for financial management;Assist for transportation;Help with stairs or ramp for entrance;Two people to help with walking and/or transfers;Two people to help with bathing/dressing/bathroom   Equipment Recommendations  Rolling walker (2 wheels);BSC/3in1  (pending progress)    Recommendations for Other Services       Precautions / Restrictions Precautions Precautions: Fall Precaution Comments: HOH Restrictions Weight Bearing Restrictions: No     Mobility  Bed Mobility Overal bed mobility: Needs Assistance Bed Mobility: Supine to Sit, Sit to Supine, Rolling Rolling: Min assist   Supine to sit: Max assist Sit to supine: Max assist   General bed mobility comments: Practiced rolling, discussed importance of weight shifting, with min assist and max VC pt able to roll onto Rt side (pillow placed at end of session to unload.) Performed with max assist supine to sit and back to supine, max VC, facilitatory cues to use hands on rail as able, pt reaching for therapist hand at times, shows initiation of trunk and LEs but requires max A for LEs and trunk.    Transfers                        Ambulation/Gait                   Stairs             Wheelchair Mobility    Modified Rankin (Stroke Patients Only)       Balance Overall balance assessment: Needs assistance Sitting-balance support: Feet supported, Single extremity supported Sitting balance-Leahy Scale: Zero Sitting balance - Comments: Heavy rightward lean, at times LOB posteriorly. Very briefly sat unsupported while using rail on Lt to hold and stabilize but unable to remain focused long enough to hold rail. Postural control: Right lateral lean, Posterior lean                                  Cognition Arousal/Alertness: Awake/alert Behavior During Therapy: Flat affect Overall Cognitive  Status: Impaired/Different from baseline Area of Impairment: Orientation, Attention, Memory, Following commands, Safety/judgement, Awareness, Problem solving                 Orientation Level: Disoriented to, Time (Recalls she is at [facility] for a brain mass) Current Attention Level: Sustained Memory: Decreased recall of precautions, Decreased  short-term memory Following Commands: Follows one step commands with increased time, Follows one step commands inconsistently Safety/Judgement: Decreased awareness of safety, Decreased awareness of deficits Awareness: Intellectual Problem Solving: Difficulty sequencing, Requires verbal cues, Requires tactile cues, Slow processing, Decreased initiation General Comments: More oriented today.        Exercises General Exercises - Lower Extremity Ankle Circles/Pumps: AROM, Left, 10 reps, Supine Long Arc Quad: Strengthening, Both, 10 reps, Seated Other Exercises Other Exercises: Tol sitting EOB approx 5 min working on weight shift, finding midline reaching, and using rail to stabilize, attempt increased facilitation of LUE, awareness of deficits and compensatory techniques.    General Comments General comments (skin integrity, edema, etc.): Challenging to keep patient focused on task at hand. Perseverating on having her bra within reach. Participated with great enouragement and frequent redirection but still limited 2/2 cognitive deficits.      Pertinent Vitals/Pain Pain Assessment Pain Assessment: PAINAD Faces Pain Scale: Hurts even more Pain Location: Rt hip Pain Descriptors / Indicators: Constant Pain Intervention(s): Limited activity within patient's tolerance, Premedicated before session, Repositioned    Home Living                          Prior Function            PT Goals (current goals can now be found in the care plan section) Acute Rehab PT Goals Patient Stated Goal: Give me my bra. PT Goal Formulation: Patient unable to participate in goal setting Time For Goal Achievement: 03/16/22 Potential to Achieve Goals: Poor Progress towards PT goals: Not progressing toward goals - comment    Frequency    Min 3X/week      PT Plan Current plan remains appropriate    Co-evaluation              AM-PAC PT "6 Clicks" Mobility   Outcome Measure  Help  needed turning from your back to your side while in a flat bed without using bedrails?: Total Help needed moving from lying on your back to sitting on the side of a flat bed without using bedrails?: A Lot Help needed moving to and from a bed to a chair (including a wheelchair)?: Total Help needed standing up from a chair using your arms (e.g., wheelchair or bedside chair)?: Total Help needed to walk in hospital room?: Total Help needed climbing 3-5 steps with a railing? : Total 6 Click Score: 7    End of Session   Activity Tolerance: Patient tolerated treatment well (limited due to decreased cognition) Patient left: in bed;with bed alarm set;with call bell/phone within reach Nurse Communication: Mobility status PT Visit Diagnosis: Unsteadiness on feet (R26.81);Difficulty in walking, not elsewhere classified (R26.2)     Time: 2563-8937 PT Time Calculation (min) (ACUTE ONLY): 23 min  Charges:  $Therapeutic Activity: 23-37 mins                     Candie Mile, PT    Ellouise Newer 03/11/2022, 11:03 AM

## 2022-03-11 NOTE — Progress Notes (Signed)
PROGRESS NOTE        PATIENT DETAILS Name: Jennifer Pacheco Age: 66 y.o. Sex: female Date of Birth: [birth year] Admit Date: 01/30/2022 Admitting Physician Collier Bullock, MD EVO:[id number], Brigitte Pulse, NP  Brief Summary: Patient is a 66 y.o.  female with history of DM-2, HTN who presented to [facility] ED on 6/2 after she was found confused/down in feces/urine-she was hypotensive/hypoxic-she was found to have a submassive PE-CT head showed right basal ganglia mass with mass effect-she was transferred to [place] she was monitored and started on anticoagulation.  Unfortunately further hospital course was complicated by development of a retroperitoneal bleed and acute blood loss anemia.  Heparin was discontinued-IVC filter was placed-and upon further stability-patient was transferred to [facility].  Upon further neuroimaging studies-brain mass was consistent with a meningioma.  Further hospital course was complicated by persistent cognitive dysfunction (likely Proofreader working on guardianship) and development of significant LLE swelling with massive clot burden (after discussion with vascular-rechallenged with anticoagulation).  Currently remains medically stable-and awaiting SNF/guardianship process.  Significant studies: 6/2>> CTA chest: PE with CT evidence of RV strain. 6/2>> CT abdomen/pelvis: Cholelithiasis, CT findings of left pyelonephritis, esophageal/patulous/thickened distally 6/2>> CT head: Large hypodense/irregular mass in the right basal ganglia/right insula-with surrounding edema. 6/3>> RUQ ultrasound: Cholelithiasis/mild pericholecystic fluid/mild gallbladder wall thickening. 6/3>> MRI brain no contrast: 5.2 x 5.3 cm mass on the right-with mass effect and vasogenic edema 6/3>> Echo: EF 81-82%, RV systolic function normal 9/9>> CT angio GI bleed: No areas of active extravasation-large left-sided retroperitoneal hematoma. 6/14>> MRI brain with/without  contrast: 5.9 x 5.4 x 5.9 enhancing mass in the right frontotemporal/periinsular region-favored extra-axial in origin and favored to reflect a meningioma. 6/23>> CT head: Large meningioma involving the right frontotemporal region with associated vasogenic edema/regional mass effect 7/5>> acute DVT involving left common iliac, external iliac, common femoral, left femoral, left proximal profunda, left popliteal, left posterior tibial, left peroneal, left gastrocnemius   Procedures: 6/5>> IVC filter by IR  Consults: PCCM, neurosurgery, GI, IR, palliative care, vascular surgery  Subjective: Confused-less LLE swelling-no major events overnight.  Awaiting guardianship process/SNF placement.  Objective: Vitals: Blood pressure 108/67, pulse 85, temperature 97.7 F (36.5 C), temperature source Oral, resp. rate 16, height '5\' 5"'$  (1.651 m), weight 68.9 kg, SpO2 99 %.   Exam: Gen Exam:Alert awake-not in any distress HEENT:atraumatic, normocephalic Chest: B/L clear to auscultation anteriorly CVS:S1S2 regular Abdomen:soft non tender, non distended Extremities: LLE swelling persist-unchanged since yesterday. Neurology: Non focal Skin: no rash   Pertinent Labs/Radiology:    Latest Ref Rng & Units 03/09/2022   12:34 AM 03/08/2022    2:56 AM 03/07/2022    7:29 AM  CBC  WBC 4.0 - 10.5 K/uL 10.9  10.7  9.8   Hemoglobin 12.0 - 15.0 g/dL 12.7  13.2  12.9   Hematocrit 36.0 - 46.0 % 40.0  40.4  39.2   Platelets 150 - 400 K/uL 317  266  257     Lab Results  Component Value Date   NA 137 03/07/2022   K 4.1 03/07/2022   CL 101 03/07/2022   CO2 30 03/07/2022      Assessment/Plan: Submassive PE with acute cor pulmonale: Did not tolerate IV heparin on initial presentation and developed a retroperitoneal bleed-IVC filter placed.  Unfortunately over the past several days has developed a significant DVT in LLE-see  below.  LLE DVT: Significant clot burden in LLE on Doppler ultrasound on 7/5-difficult  situation-has IVC filter in place-discussed with vascular surgery Dr. Stanford Breed on 7/6-not a candidate for thrombectomy-recommendations were to rechallenge with IV heparin-which she tolerated well for 72 hours-and hence was transitioned to Eliquis on 7/9.  Repeat CBC on 7/10 remained stable.  We will plan on checking CBC periodically-at least once a week for the next few weeks.   Acute blood loss anemia due to retroperitoneal bleed: Required several units of PRBC transfusion-hemoglobin stable even after anticoagulation rechallenge.  Follow periodically.    Acute metabolic encephalopathy: Multifactorial-due to acute illness/brain mass-suspicion for some underlying cognitive issue (per prior note-estranged from family-does strange things-fails to recognize her brother at times). Apparently has some impaired hearing which could contribute.  Pleasantly confused-vitamin B12/TSH/RPR all stable.   Family not willing to participate in her care-guardianship process in progress.  Large right basal ganglia mass with surrounding edema/11 mm midline shift: Continues to have left hemiparesis-on steroids-Per prior notes-now thought to be a meningioma-neurosurgery recommending outpatient follow-up in 2-3 months for reevaluation/craniotomy.  Spoke with neurosurgeon-Dr. Dawley on 7/6-no contraindication for anticoagulation-suggest that we slowly taper off Decadron in 2 weeks.  Multifactorial shock-due to hypovolemia/submassive PE/septic shock (pyelonephritis): BP stable-hemoglobin stable-no longer on antibiotics for  Acute hypoxic respiratory failure: Resolved-due to PE-on room air.  Distal esophageal thickening: Noted on CT-evaluated by GI-recommendations are for PPI-EGD deferred for now-could be possibly done in the outpatient setting.  DM-2 (A1c 5.5 on 6/2): CBG stable-hold metformin for now.  No results for input(s): "GLUCAP" in the last 72 hours.   Deconditioning: Evaluated by therapy-recommendations are for  SNF.  Other issues: APS involved-social work following-guardianship process underway.  Discussed with social work Arbie Cookey on 7/6-no family member wants to make any decisions-guardianship process underway.   Pressure Ulcer: Pressure Injury 01/30/22 Sacrum Right;Medial Stage 2 -  Partial thickness loss of dermis presenting as a shallow open injury with a red, pink wound bed without slough. (Active)  01/30/22 2053  Location: Sacrum  Location Orientation: Right;Medial  Staging: Stage 2 -  Partial thickness loss of dermis presenting as a shallow open injury with a red, pink wound bed without slough.  Wound Description (Comments):   Present on Admission: Yes  Dressing Type Foam - Lift dressing to assess site every shift 03/11/22 0900     Pressure Injury 01/30/22 Coccyx Lower;Medial Stage 2 -  Partial thickness loss of dermis presenting as a shallow open injury with a red, pink wound bed without slough. (Active)  01/30/22 2053  Location: Coccyx  Location Orientation: Lower;Medial  Staging: Stage 2 -  Partial thickness loss of dermis presenting as a shallow open injury with a red, pink wound bed without slough.  Wound Description (Comments):   Present on Admission: Yes  Dressing Type Foam - Lift dressing to assess site every shift 03/09/22 1030    BMI: Estimated body mass index is 25.28 kg/m as calculated from the following:   Height as of this encounter: '5\' 5"'$  (1.651 m).   Weight as of this encounter: 68.9 kg.   Code status:   Code Status: Full Code   DVT Prophylaxis: Place and maintain sequential compression device Start: 02/02/22 1450 apixaban (ELIQUIS) tablet 5 mg   IV heparin.   Family Communication: None at [place] for patient's friend-[phone] on 7/6.  Per social work-no family member wants to make decisions.   Disposition Plan: Status is: Inpatient Remains inpatient appropriate because: Awaiting guardianship process-SNF  placement.   Planned Discharge  Destination:Skilled nursing facility   Diet: Diet Order             DIET DYS 3 Room service appropriate? Yes; Fluid consistency: Thin  Diet effective now                     Antimicrobial agents: Anti-infectives (From admission, onward)    Start     Dose/Rate Route Frequency Ordered Stop   02/03/22 1715  levofloxacin (LEVAQUIN) tablet 500 mg  Status:  Discontinued        500 mg Oral Daily 02/03/22 1627 02/05/22 1355   02/02/22 2045  levofloxacin (LEVAQUIN) IVPB 500 mg  Status:  Discontinued       Note to Pharmacy: Adjust as needed - switching to IV since unable to take PO   500 mg 100 mL/hr over 60 Minutes Intravenous Every 24 hours 02/02/22 1959 02/03/22 1627   02/01/22 1400  levofloxacin (LEVAQUIN) tablet 500 mg  Status:  Discontinued        500 mg Oral Daily 02/01/22 0911 02/02/22 1959   01/31/22 1000  vancomycin (VANCOREADY) IVPB 750 mg/150 mL  Status:  Discontinued        750 mg 150 mL/hr over 60 Minutes Intravenous Every 12 hours 01/31/22 0003 01/31/22 0848   01/31/22 0200  vancomycin (VANCOREADY) IVPB 1500 mg/300 mL  Status:  Discontinued        1,500 mg 150 mL/hr over 120 Minutes Intravenous Every 12 hours 01/30/22 1241 01/30/22 2348   01/30/22 2100  aztreonam (AZACTAM) 2 g in sodium chloride 0.9 % 100 mL IVPB  Status:  Discontinued        2 g 200 mL/hr over 30 Minutes Intravenous Every 8 hours 01/30/22 1240 02/01/22 0923   01/30/22 1245  aztreonam (AZACTAM) 2 g in sodium chloride 0.9 % 100 mL IVPB        2 g 200 mL/hr over 30 Minutes Intravenous  Once 01/30/22 1231 01/30/22 1408   01/30/22 1245  metroNIDAZOLE (FLAGYL) IVPB 500 mg        500 mg 100 mL/hr over 60 Minutes Intravenous  Once 01/30/22 1231 01/30/22 1408   01/30/22 1245  vancomycin (VANCOCIN) IVPB 1000 mg/200 mL premix  Status:  Discontinued        1,000 mg 200 mL/hr over 60 Minutes Intravenous  Once 01/30/22 1231 01/30/22 1239   01/30/22 1245  vancomycin (VANCOREADY) IVPB 2000 mg/400 mL        2,000  mg 200 mL/hr over 120 Minutes Intravenous  Once 01/30/22 1239 01/30/22 1607        MEDICATIONS: Scheduled Meds:  apixaban  5 mg Oral BID   dexamethasone  3 mg Oral Daily   docusate sodium  100 mg Oral BID   gabapentin  300 mg Oral BID   pantoprazole  40 mg Oral BID   polyethylene glycol  17 g Oral BID   senna  1 tablet Oral QHS   Continuous Infusions:  sodium chloride Stopped (02/02/22 0929)   PRN Meds:.acetaminophen, calcium carbonate, iohexol, melatonin, ondansetron (ZOFRAN) IV   I have personally reviewed following labs and imaging studies  LABORATORY DATA: CBC: Recent Labs  Lab 03/06/22 0541 03/06/22 1650 03/07/22 0729 03/08/22 0256 03/09/22 0034  WBC 9.8 14.6* 9.8 10.7* 10.9*  HGB 13.1 12.5 12.9 13.2 12.7  HCT 40.4 39.1 39.2 40.4 40.0  MCV 91.0 89.7 89.1 89.0 89.7  PLT 238 247 257 266 317  Basic Metabolic Panel: Recent Labs  Lab 03/07/22 0729  NA 137  K 4.1  CL 101  CO2 30  GLUCOSE 93  BUN 13  CREATININE 0.61  CALCIUM 8.7*     GFR: Estimated Creatinine Clearance: 68.4 mL/min (by C-G formula based on SCr of 0.61 mg/dL).  Liver Function Tests: No results for input(s): "AST", "ALT", "ALKPHOS", "BILITOT", "PROT", "ALBUMIN" in the last 168 hours. No results for input(s): "LIPASE", "AMYLASE" in the last 168 hours. No results for input(s): "AMMONIA" in the last 168 hours.  Coagulation Profile: No results for input(s): "INR", "PROTIME" in the last 168 hours.  Cardiac Enzymes: No results for input(s): "CKTOTAL", "CKMB", "CKMBINDEX", "TROPONINI" in the last 168 hours.  BNP (last 3 results) No results for input(s): "PROBNP" in the last 8760 hours.  Lipid Profile: No results for input(s): "CHOL", "HDL", "LDLCALC", "TRIG", "CHOLHDL", "LDLDIRECT" in the last 72 hours.  Thyroid Function Tests: Recent Labs    03/09/22 0034  TSH 1.135     Anemia Panel: No results for input(s): "VITAMINB12", "FOLATE", "FERRITIN", "TIBC", "IRON", "RETICCTPCT"  in the last 72 hours.  Urine analysis:    Component Value Date/Time   COLORURINE YELLOW 01/30/2022 1720   APPEARANCEUR CLEAR 01/30/2022 1720   LABSPEC 1.044 (H) 01/30/2022 1720   PHURINE 5.0 01/30/2022 1720   GLUCOSEU NEGATIVE 01/30/2022 1720   HGBUR SMALL (A) 01/30/2022 1720   BILIRUBINUR NEGATIVE 01/30/2022 1720   KETONESUR 20 (A) 01/30/2022 1720   PROTEINUR 30 (A) 01/30/2022 1720   NITRITE POSITIVE (A) 01/30/2022 1720   LEUKOCYTESUR SMALL (A) 01/30/2022 1720    Sepsis Labs: Lactic Acid, Venous    Component Value Date/Time   LATICACIDVEN 1.7 02/03/2022 2101    MICROBIOLOGY: No results found for this or any previous visit (from the past 240 hour(s)).  RADIOLOGY STUDIES/RESULTS: No results found.   LOS: 40 days   Oren Binet, MD  [facility]    To contact the attending provider between 7A-7P or the covering provider during after hours 7P-7A, please log into the web site [email or web address] and access using universal [place] password for that web site. If you do not have the password, please call the hospital operator.  03/11/2022, 12:40 PM

## 2022-03-11 NOTE — Progress Notes (Signed)
Nutrition Follow-up  DOCUMENTATION CODES:   Not applicable  INTERVENTION:   Trial Boost Breeze po TID, each supplement provides 250 kcal and 9 grams of protein  Continue Magic cup TID with meals, each supplement provides 290 kcal and 9 grams of protein  Continue pudding with meals  Continue MVI with Minerals daily   NUTRITION DIAGNOSIS:   Increased nutrient needs related to acute illness, wound healing as evidenced by estimated needs.  Being addressed via supplements  GOAL:   Patient will meet greater than or equal to 90% of their needs  Progressing  MONITOR:   PO intake, Supplement acceptance, Weight trends, Skin  REASON FOR ASSESSMENT:   LOS    ASSESSMENT:   66 yo female admitted with submassive PE with acute cor pulmonale, acute blood loss anemia due to retroperitoneal bleed, acute metabolic encephalopathy with large right basal ganglia mass with surrounding edema and 11 mm midline shift.  Pt remains confused. Awaiting guardianship process/SNF placement  Recorded po intake remains variable, 25-100% of meals.   Pt eating some of the pudding on meal trays, some of the Magic Cup but prefers regular ice cream.   Current wt 68.9 kg; weight appears relatively stable  Labs: reviewed Meds: decadron, colace, protonix, senna, miralax   Diet Order:   Diet Order             DIET DYS 3 Room service appropriate? Yes; Fluid consistency: Thin  Diet effective now                   EDUCATION NEEDS:   Not appropriate for education at this time  Skin:  Skin Assessment: Skin Integrity Issues: Skin Integrity Issues:: Stage II Stage II: sacrum, coccyx  Last BM:  7/10  Height:   Ht Readings from Last 1 Encounters:  01/31/22 '5\' 5"'$  (1.651 m)    Weight:   Wt Readings from Last 1 Encounters:  03/09/22 68.9 kg     BMI:  Body mass index is 25.28 kg/m.  Estimated Nutritional Needs:   Kcal:  1750-1950 kcals  Protein:  88-98 g  Fluid:  >/= 1.8  L   Kerman Passey MS, RDN, LDN, CNSC Registered Dietitian 3 Clinical Nutrition RD Pager and On-Call Pager Number Located in [place]

## 2022-03-12 DIAGNOSIS — I2609 Other pulmonary embolism with acute cor pulmonale: Secondary | ICD-10-CM | POA: Diagnosis not present

## 2022-03-12 LAB — MRSA NEXT GEN BY PCR, NASAL: MRSA by PCR Next Gen: NOT DETECTED

## 2022-03-12 NOTE — Progress Notes (Signed)
Physical Therapy Treatment Patient Details Name: Gay Moncivais MRN: [id number] DOB: 09/10/1955 Today's Date: 03/12/2022   History of Present Illness Pt is 66 yo female who was found down in her home and taken to [facility]. 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 Pt with retroperitoneal bleed and IVC filter placed 6/5. 7/5>> acute DVT involving left common iliac, external iliac, common femoral, left femoral, left proximal profunda, left popliteal, left posterior tibial, left peroneal, left gastrocnemius PMH: DM2, HTN.    PT Comments    Patient awake and alert on arrival. Throughout session is highly internally distracted and skips from topic to topic making it difficult to get her to focus on therapy tasks. Ultimately sat EOB at least 8 minutes with max assist with periods of min assist (but <15 seconds and would lose balance either posteriorly or to her right without righting reactions). Attempted standing x 3 reps with no assist from patient and unsuccessful. Pt later initiated standing while closely guarded and nearly lifted her hips clear of mattress. Patient with +BM and returned to supine and rolling for cleaning.     Recommendations for follow up therapy are one component of a multi-disciplinary discharge planning process, led by the attending physician.  Recommendations may be updated based on patient status, additional functional criteria and insurance authorization.  Follow Up Recommendations  Skilled nursing-short term rehab (<3 hours/day) Can patient physically be transported by private vehicle: No   Assistance Recommended at Discharge Frequent or constant Supervision/Assistance  Patient can return home with the following Assistance with cooking/housework;Direct supervision/assist for medications management;Direct supervision/assist for financial management;Assist for transportation;Help with stairs or ramp for entrance;Two people to help  with walking and/or transfers;Two people to help with bathing/dressing/bathroom   Equipment Recommendations  Rolling walker (2 wheels);BSC/3in1 (pending progress)    Recommendations for Other Services       Precautions / Restrictions Precautions Precautions: Fall Precaution Comments: HOH Restrictions Weight Bearing Restrictions: No     Mobility  Bed Mobility Overal bed mobility: Needs Assistance Bed Mobility: Rolling, Sit to Sidelying, Sidelying to Sit Rolling: Mod assist Sidelying to sit: Mod assist, +2 for physical assistance     Sit to sidelying: Mod assist General bed mobility comments: Patient requires hand over hand cuing for rolling; assist to take legs over EOB and raise torso to sit; able to lower her torso down to mattress and lifts RLE onto bed, initiates lifting LLE but needs assist to get onto bed    Transfers Overall transfer level: Needs assistance Equipment used: 2 person hand held assist Transfers: Sit to/from Stand Sit to Stand: Max assist, +2 physical assistance           General transfer comment: attempted to assist pt to come to stand x 2 reps with no initiation by pt; afterward pt stated she was going to get into the chair and attempted to stand by herself with partial clearing of buttocks off the mattress--pt repeated x 3    Ambulation/Gait               General Gait Details: unable to stand   Stairs             Wheelchair Mobility    Modified Rankin (Stroke Patients Only)       Balance Overall balance assessment: Needs assistance Sitting-balance support: Feet supported, Single extremity supported Sitting balance-Leahy Scale: Zero Sitting balance - Comments: Heavy rightward lean, at times LOB posteriorly without  righting reactions. Very briefly sat unsupported while using RUE to hold and stabilize but unable to remain focused long Postural control: Right lateral lean, Posterior lean                                   Cognition Arousal/Alertness: Awake/alert Behavior During Therapy: Flat affect Overall Cognitive Status: Impaired/Different from baseline Area of Impairment: Orientation, Attention, Memory, Following commands, Safety/judgement, Awareness, Problem solving                 Orientation Level: Disoriented to, Time, Place, Situation (would not attend to questions and answer) Current Attention Level: Sustained, Focused Memory: Decreased recall of precautions, Decreased short-term memory Following Commands: Follows one step commands with increased time, Follows one step commands inconsistently Safety/Judgement: Decreased awareness of safety, Decreased awareness of deficits Awareness:  (pre-intellectual) Problem Solving: Difficulty sequencing, Requires verbal cues, Requires tactile cues, Slow processing, Decreased initiation General Comments: jumps from topic to topic; not responding to orientation questions; poor awareness of left side        Exercises Other Exercises Other Exercises: Tol sitting EOB approx 8 min working on weight shift and finding midline; able to lower and push herself up from left forearm/elbow x 2 reps, but overshoots midline and needs assist to maintain balance    General Comments General comments (skin integrity, edema, etc.): Pt highly internally distracted with difficulty keeping her focused on therapeutic tasks.      Pertinent Vitals/Pain Pain Assessment Pain Assessment: PAINAD Breathing: normal Negative Vocalization: none Facial Expression: smiling or inexpressive Body Language: relaxed Consolability: no need to console PAINAD Score: 0    Home Living                          Prior Function            PT Goals (current goals can now be found in the care plan section) Acute Rehab PT Goals Patient Stated Goal: unable to state; highly distracted PT Goal Formulation: Patient unable to participate in goal setting Time For Goal  Achievement: 03/16/22 Potential to Achieve Goals: Poor Progress towards PT goals: Not progressing toward goals - comment (cognition and left inattention continue to make limited progress)    Frequency    Min 2X/week      PT Plan Current plan remains appropriate;Frequency needs to be updated    Co-evaluation              AM-PAC PT "6 Clicks" Mobility   Outcome Measure  Help needed turning from your back to your side while in a flat bed without using bedrails?: Total Help needed moving from lying on your back to sitting on the side of a flat bed without using bedrails?: Total Help needed moving to and from a bed to a chair (including a wheelchair)?: Total Help needed standing up from a chair using your arms (e.g., wheelchair or bedside chair)?: Total Help needed to walk in hospital room?: Total Help needed climbing 3-5 steps with a railing? : Total 6 Click Score: 6    End of Session Equipment Utilized During Treatment: Gait belt Activity Tolerance: Patient tolerated treatment well (limited due to decreased cognition) Patient left: in bed;with bed alarm set;with call bell/phone within reach Nurse Communication: Mobility status;Other (comment) (had BM and needs new purewick) PT Visit Diagnosis: Unsteadiness on feet (R26.81);Difficulty in walking, not elsewhere classified (R26.2)  Time: 4818-5631 PT Time Calculation (min) (ACUTE ONLY): 29 min  Charges:  $Neuromuscular Re-education: 8-22 mins                      [place]  Office [phone]    Rexanne Mano 03/12/2022, 9:56 AM

## 2022-03-12 NOTE — Plan of Care (Signed)
  Problem: Education: Goal: Ability to describe self-care measures that may prevent or decrease complications (Diabetes Survival Skills Education) will improve Outcome: Progressing Goal: Individualized Educational Video(s) Outcome: Progressing   Problem: Coping: Goal: Ability to adjust to condition or change in health will improve Outcome: Progressing   Problem: Fluid Volume: Goal: Ability to maintain a balanced intake and output will improve Outcome: Progressing   Problem: Health Behavior/Discharge Planning: Goal: Ability to identify and utilize available resources and services will improve Outcome: Progressing Goal: Ability to manage health-related needs will improve Outcome: Progressing   Problem: Metabolic: Goal: Ability to maintain appropriate glucose levels will improve Outcome: Progressing   Problem: Nutritional: Goal: Maintenance of adequate nutrition will improve Outcome: Progressing Goal: Progress toward achieving an optimal weight will improve Outcome: Progressing   Problem: Skin Integrity: Goal: Risk for impaired skin integrity will decrease Outcome: Progressing   Problem: Tissue Perfusion: Goal: Adequacy of tissue perfusion will improve Outcome: Progressing   Problem: Education: Goal: Knowledge of General Education information will improve Description: Including pain rating scale, medication(s)/side effects and non-pharmacologic comfort measures Outcome: Progressing   Problem: Health Behavior/Discharge Planning: Goal: Ability to manage health-related needs will improve Outcome: Progressing   Problem: Clinical Measurements: Goal: Ability to maintain clinical measurements within normal limits will improve Outcome: Progressing Goal: Will remain free from infection Outcome: Progressing Goal: Diagnostic test results will improve Outcome: Progressing Goal: Respiratory complications will improve Outcome: Progressing Goal: Cardiovascular complication will  be avoided Outcome: Progressing   Problem: Activity: Goal: Risk for activity intolerance will decrease Outcome: Progressing   Problem: Nutrition: Goal: Adequate nutrition will be maintained Outcome: Progressing   Problem: Coping: Goal: Level of anxiety will decrease Outcome: Progressing   Problem: Elimination: Goal: Will not experience complications related to bowel motility Outcome: Progressing Goal: Will not experience complications related to urinary retention Outcome: Progressing   Problem: Pain Managment: Goal: General experience of comfort will improve Outcome: Progressing   Problem: Safety: Goal: Ability to remain free from injury will improve Outcome: Progressing   Problem: Skin Integrity: Goal: Risk for impaired skin integrity will decrease Outcome: Progressing   Problem: Education: Goal: Understanding of CV disease, CV risk reduction, and recovery process will improve Outcome: Progressing Goal: Individualized Educational Video(s) Outcome: Progressing   Problem: Activity: Goal: Ability to return to baseline activity level will improve Outcome: Progressing   Problem: Cardiovascular: Goal: Ability to achieve and maintain adequate cardiovascular perfusion will improve Outcome: Progressing Goal: Vascular access site(s) Level 0-1 will be maintained Outcome: Progressing   Problem: Health Behavior/Discharge Planning: Goal: Ability to safely manage health-related needs after discharge will improve Outcome: Progressing   Problem: Nutrition: Goal: Adequate nutrition will be maintained Outcome: Progressing   Problem: Coping: Goal: Level of anxiety will decrease Outcome: Progressing   Problem: Elimination: Goal: Will not experience complications related to bowel motility Outcome: Progressing Goal: Will not experience complications related to urinary retention Outcome: Progressing

## 2022-03-12 NOTE — Progress Notes (Signed)
PROGRESS NOTE        PATIENT DETAILS Name: Jennifer Pacheco Age: 66 y.o. Sex: female Date of Birth: 05-14-1956 Admit Date: 01/30/2022 Admitting Physician Collier Bullock, MD [id number], Brigitte Pulse, NP  Brief Summary: Patient is a 66 y.o.  female with history of DM-2, HTN who presented to [facility] ED on 6/2 after she was found confused/down in feces/urine-she was hypotensive/hypoxic-she was found to have a submassive PE-CT head showed right basal ganglia mass with mass effect-she was transferred to [place] she was monitored and started on anticoagulation.  Unfortunately further hospital course was complicated by development of a retroperitoneal bleed and acute blood loss anemia.  Heparin was discontinued-IVC filter was placed-and upon further stability-patient was transferred to [facility].  Upon further neuroimaging studies-brain mass was consistent with a meningioma.  Further hospital course was complicated by persistent cognitive dysfunction (likely chronic-social worker working on guardianship) and development of significant LLE swelling with massive clot burden.  After discussion with vascular surgery-she was rechallenged with anticoagulation which she has tolerated without any signs of rebleeding.  Currently remains medically stable-and awaiting SNF/guardianship process.  Significant studies: 6/2>> CTA chest: PE with CT evidence of RV strain. 6/2>> CT abdomen/pelvis: Cholelithiasis, CT findings of left pyelonephritis, esophageal/patulous/thickened distally 6/2>> CT head: Large hypodense/irregular mass in the right basal ganglia/right insula-with surrounding edema. 6/3>> RUQ ultrasound: Cholelithiasis/mild pericholecystic fluid/mild gallbladder wall thickening. 6/3>> MRI brain no contrast: 5.2 x 5.3 cm mass on the right-with mass effect and vasogenic edema 6/3>> Echo: EF 30-86%, RV systolic function normal 5/7>> CT angio GI bleed: No areas of active extravasation-large  left-sided retroperitoneal hematoma. 6/14>> MRI brain with/without contrast: 5.9 x 5.4 x 5.9 enhancing mass in the right frontotemporal/periinsular region-favored extra-axial in origin and favored to reflect a meningioma. 6/23>> CT head: Large meningioma involving the right frontotemporal region with associated vasogenic edema/regional mass effect 7/5>> acute DVT involving left common iliac, external iliac, common femoral, left femoral, left proximal profunda, left popliteal, left posterior tibial, left peroneal, left gastrocnemius   Procedures: 6/5>> IVC filter by IR  Consults: PCCM, neurosurgery, GI, IR, palliative care, vascular surgery  Subjective: Pleasantly confused-no major events overnight.  Awaiting guardianship/SNF process.  Objective: Vitals: Blood pressure 120/64, pulse 73, temperature 98 F (36.7 C), resp. rate 18, height '5\' 5"'$  (1.651 m), weight 68.9 kg, SpO2 100 %.   Exam: LLE swelling persists. Pleasantly confused. Rest of the exam-remains unchanged.  Pertinent Labs/Radiology:    Latest Ref Rng & Units 03/09/2022   12:34 AM 03/08/2022    2:56 AM 03/07/2022    7:29 AM  CBC  WBC 4.0 - 10.5 K/uL 10.9  10.7  9.8   Hemoglobin 12.0 - 15.0 g/dL 12.7  13.2  12.9   Hematocrit 36.0 - 46.0 % 40.0  40.4  39.2   Platelets 150 - 400 K/uL 317  266  257     Lab Results  Component Value Date   NA 137 03/07/2022   K 4.1 03/07/2022   CL 101 03/07/2022   CO2 30 03/07/2022      Assessment/Plan: Submassive PE with acute cor pulmonale: Did not tolerate IV heparin on initial presentation and developed a retroperitoneal bleed-IVC filter placed.  Unfortunately over the past several days has developed a significant DVT in LLE-see below.  LLE DVT: Significant clot burden in LLE on Doppler ultrasound on 7/5-difficult situation-has IVC  filter in place-discussed with vascular surgery Dr. Stanford Breed on 7/6-not a candidate for thrombectomy-recommendations were to rechallenge with IV  heparin-which she tolerated well for 72 hours-and hence was transitioned to Eliquis on 7/9.  Repeat CBC on 7/10 remained stable.  We will plan on checking CBC periodically-at least once a week for the next few weeks.   Acute blood loss anemia due to retroperitoneal bleed: Required several units of PRBC transfusion-hemoglobin stable even after anticoagulation rechallenge.  Follow periodically.    Acute metabolic encephalopathy: Multifactorial-due to acute illness/brain mass-suspicion for some underlying cognitive issue (per prior note-estranged from family-does strange things-fails to recognize her brother at times). Apparently has some impaired hearing which could contribute.  Pleasantly confused-vitamin B12/TSH/RPR all stable.   Family not willing to participate in her care-guardianship process in progress.  Large right basal ganglia mass with surrounding edema/11 mm midline shift: Continues to have left hemiparesis-on steroids-Per prior notes-now thought to be a meningioma-neurosurgery recommending outpatient follow-up in 2-3 months for reevaluation/craniotomy.  Spoke with neurosurgeon-Dr. Dawley on 7/6-no contraindication for anticoagulation-suggest that we slowly taper off Decadron in 2 weeks.  Multifactorial shock-due to hypovolemia/submassive PE/septic shock (pyelonephritis): BP stable-hemoglobin stable-no longer on antibiotics for  Acute hypoxic respiratory failure: Resolved-due to PE-on room air.  Distal esophageal thickening: Noted on CT-evaluated by GI-recommendations are for PPI-EGD deferred for now-could be possibly done in the outpatient setting.  DM-2 (A1c 5.5 on 6/2): CBG stable-hold metformin for now.  No results for input(s): "GLUCAP" in the last 72 hours.   Deconditioning: Evaluated by therapy-recommendations are for SNF.  Other issues: APS involved-social work following-guardianship process underway.  Discussed with social work Arbie Cookey on 7/6-no family member wants to make any  decisions-guardianship process underway.   Pressure Ulcer: Pressure Injury 01/30/22 Sacrum Right;Medial Stage 2 -  Partial thickness loss of dermis presenting as a shallow open injury with a red, pink wound bed without slough. (Active)  01/30/22 2053  Location: Sacrum  Location Orientation: Right;Medial  Staging: Stage 2 -  Partial thickness loss of dermis presenting as a shallow open injury with a red, pink wound bed without slough.  Wound Description (Comments):   Present on Admission: Yes  Dressing Type Foam - Lift dressing to assess site every shift 03/11/22 2231     Pressure Injury 01/30/22 Coccyx Lower;Medial Stage 2 -  Partial thickness loss of dermis presenting as a shallow open injury with a red, pink wound bed without slough. (Active)  01/30/22 2053  Location: Coccyx  Location Orientation: Lower;Medial  Staging: Stage 2 -  Partial thickness loss of dermis presenting as a shallow open injury with a red, pink wound bed without slough.  Wound Description (Comments):   Present on Admission: Yes  Dressing Type Foam - Lift dressing to assess site every shift 03/09/22 1030    BMI: Estimated body mass index is 25.28 kg/m as calculated from the following:   Height as of this encounter: '5\' 5"'$  (1.651 m).   Weight as of this encounter: 68.9 kg.   Code status:   Code Status: Full Code   DVT Prophylaxis: Place and maintain sequential compression device Start: 02/02/22 1450 apixaban (ELIQUIS) tablet 5 mg   IV heparin.   Family Communication: None at [facility] for patient's friend-[phone] on 7/6.  Per social work-no family member wants to make decisions.   Disposition Plan: Status is: Inpatient Remains inpatient appropriate because: Awaiting guardianship process-SNF placement.   Planned Discharge Destination:Skilled nursing facility   Diet: Diet Order  DIET DYS 3 Room service appropriate? Yes; Fluid consistency: Thin  Diet effective now                      Antimicrobial agents: Anti-infectives (From admission, onward)    Start     Dose/Rate Route Frequency Ordered Stop   02/03/22 1715  levofloxacin (LEVAQUIN) tablet 500 mg  Status:  Discontinued        500 mg Oral Daily 02/03/22 1627 02/05/22 1355   02/02/22 2045  levofloxacin (LEVAQUIN) IVPB 500 mg  Status:  Discontinued       Note to Pharmacy: Adjust as needed - switching to IV since unable to take PO   500 mg 100 mL/hr over 60 Minutes Intravenous Every 24 hours 02/02/22 1959 02/03/22 1627   02/01/22 1400  levofloxacin (LEVAQUIN) tablet 500 mg  Status:  Discontinued        500 mg Oral Daily 02/01/22 0911 02/02/22 1959   01/31/22 1000  vancomycin (VANCOREADY) IVPB 750 mg/150 mL  Status:  Discontinued        750 mg 150 mL/hr over 60 Minutes Intravenous Every 12 hours 01/31/22 0003 01/31/22 0848   01/31/22 0200  vancomycin (VANCOREADY) IVPB 1500 mg/300 mL  Status:  Discontinued        1,500 mg 150 mL/hr over 120 Minutes Intravenous Every 12 hours 01/30/22 1241 01/30/22 2348   01/30/22 2100  aztreonam (AZACTAM) 2 g in sodium chloride 0.9 % 100 mL IVPB  Status:  Discontinued        2 g 200 mL/hr over 30 Minutes Intravenous Every 8 hours 01/30/22 1240 02/01/22 0923   01/30/22 1245  aztreonam (AZACTAM) 2 g in sodium chloride 0.9 % 100 mL IVPB        2 g 200 mL/hr over 30 Minutes Intravenous  Once 01/30/22 1231 01/30/22 1408   01/30/22 1245  metroNIDAZOLE (FLAGYL) IVPB 500 mg        500 mg 100 mL/hr over 60 Minutes Intravenous  Once 01/30/22 1231 01/30/22 1408   01/30/22 1245  vancomycin (VANCOCIN) IVPB 1000 mg/200 mL premix  Status:  Discontinued        1,000 mg 200 mL/hr over 60 Minutes Intravenous  Once 01/30/22 1231 01/30/22 1239   01/30/22 1245  vancomycin (VANCOREADY) IVPB 2000 mg/400 mL        2,000 mg 200 mL/hr over 120 Minutes Intravenous  Once 01/30/22 1239 01/30/22 1607        MEDICATIONS: Scheduled Meds:  apixaban  5 mg Oral BID   dexamethasone   3 mg Oral Daily   docusate sodium  100 mg Oral BID   feeding supplement  1 Container Oral TID BM   gabapentin  300 mg Oral BID   pantoprazole  40 mg Oral BID   polyethylene glycol  17 g Oral BID   senna  1 tablet Oral QHS   Continuous Infusions:  sodium chloride Stopped (02/02/22 0929)   PRN Meds:.acetaminophen, calcium carbonate, iohexol, melatonin, ondansetron (ZOFRAN) IV   I have personally reviewed following labs and imaging studies  LABORATORY DATA: CBC: Recent Labs  Lab 03/06/22 0541 03/06/22 1650 03/07/22 0729 03/08/22 0256 03/09/22 0034  WBC 9.8 14.6* 9.8 10.7* 10.9*  HGB 13.1 12.5 12.9 13.2 12.7  HCT 40.4 39.1 39.2 40.4 40.0  MCV 91.0 89.7 89.1 89.0 89.7  PLT 238 247 257 266 317     Basic Metabolic Panel: Recent Labs  Lab 03/07/22 0729  NA 137  K  4.1  CL 101  CO2 30  GLUCOSE 93  BUN 13  CREATININE 0.61  CALCIUM 8.7*     GFR: Estimated Creatinine Clearance: 68.4 mL/min (by C-G formula based on SCr of 0.61 mg/dL).  Liver Function Tests: No results for input(s): "AST", "ALT", "ALKPHOS", "BILITOT", "PROT", "ALBUMIN" in the last 168 hours. No results for input(s): "LIPASE", "AMYLASE" in the last 168 hours. No results for input(s): "AMMONIA" in the last 168 hours.  Coagulation Profile: No results for input(s): "INR", "PROTIME" in the last 168 hours.  Cardiac Enzymes: No results for input(s): "CKTOTAL", "CKMB", "CKMBINDEX", "TROPONINI" in the last 168 hours.  BNP (last 3 results) No results for input(s): "PROBNP" in the last 8760 hours.  Lipid Profile: No results for input(s): "CHOL", "HDL", "LDLCALC", "TRIG", "CHOLHDL", "LDLDIRECT" in the last 72 hours.  Thyroid Function Tests: No results for input(s): "TSH", "T4TOTAL", "FREET4", "T3FREE", "THYROIDAB" in the last 72 hours.   Anemia Panel: No results for input(s): "VITAMINB12", "FOLATE", "FERRITIN", "TIBC", "IRON", "RETICCTPCT" in the last 72 hours.  Urine analysis:    Component Value  Date/Time   COLORURINE YELLOW 01/30/2022 1720   APPEARANCEUR CLEAR 01/30/2022 1720   LABSPEC 1.044 (H) 01/30/2022 1720   PHURINE 5.0 01/30/2022 1720   GLUCOSEU NEGATIVE 01/30/2022 1720   HGBUR SMALL (A) 01/30/2022 1720   BILIRUBINUR NEGATIVE 01/30/2022 1720   KETONESUR 20 (A) 01/30/2022 1720   PROTEINUR 30 (A) 01/30/2022 1720   NITRITE POSITIVE (A) 01/30/2022 1720   LEUKOCYTESUR SMALL (A) 01/30/2022 1720    Sepsis Labs: Lactic Acid, Venous    Component Value Date/Time   LATICACIDVEN 1.7 02/03/2022 2101    MICROBIOLOGY: Recent Results (from the past 240 hour(s))  MRSA Next Gen by PCR, Nasal     Status: None   Collection Time: 03/12/22  7:03 AM   Specimen: Nasal Mucosa; Nasal Swab  Result Value Ref Range Status   MRSA by PCR Next Gen NOT DETECTED NOT DETECTED Final    Comment: (NOTE) The GeneXpert MRSA Assay (FDA approved for NASAL specimens only), is one component of a comprehensive MRSA colonization surveillance program. It is not intended to diagnose MRSA infection nor to guide or monitor treatment for MRSA infections. Test performance is not FDA approved in patients less than 38 years old. Performed at [facility], [place] [address], [place], [place] [address]     RADIOLOGY STUDIES/RESULTS: No results found.   LOS: 41 days   Oren Binet, MD  [facility]    To contact the attending provider between 7A-7P or the covering provider during after hours 7P-7A, please log into the web site [email or web address] and access using universal  password for that web site. If you do not have the password, please call the hospital operator.  03/12/2022, 1:01 PM

## 2022-03-12 NOTE — Progress Notes (Signed)
Occupational Therapy Treatment Patient Details Name: Jennifer Pacheco MRN: [id number] DOB: [birth year] Today's Date: 03/12/2022   History of present illness Pt is 66 yo female who was found down in her home and taken to [facility]. 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 Pt with retroperitoneal bleed and IVC filter placed 6/5. 7/5>> acute DVT involving left common iliac, external iliac, common femoral, left femoral, left proximal profunda, left popliteal, left posterior tibial, left peroneal, left gastrocnemius PMH: DM2, HTN.   OT comments  Pt's attention remains a large barrier towards her progress. Pt needing extensive assist for bed mobility, max assist for sitting balance with heavy R side lean and demonstrated inability to stand with +2 assist upon command, nearly cleared hips when attempting on her own.  Pt washed her face with set up and changed soiled gown with max assist and multimodal cues at EOB. Total assist for pericare after bowel incontinence at bed level.    Recommendations for follow up therapy are one component of a multi-disciplinary discharge planning process, led by the attending physician.  Recommendations may be updated based on patient status, additional functional criteria and insurance authorization.    Follow Up Recommendations  Skilled nursing-short term rehab (<3 hours/day)    Assistance Recommended at Discharge Frequent or constant Supervision/Assistance  Patient can return home with the following  Two people to help with walking and/or transfers;A lot of help with bathing/dressing/bathroom;Assistance with cooking/housework;Direct supervision/assist for medications management;Direct supervision/assist for financial management;Assist for transportation;Help with stairs or ramp for entrance   Equipment Recommendations  Other (comment) (defer to next venue)    Recommendations for Other Services      Precautions /  Restrictions Precautions Precautions: Fall Precaution Comments: HOH Restrictions Weight Bearing Restrictions: No       Mobility Bed Mobility Overal bed mobility: Needs Assistance Bed Mobility: Rolling, Sit to Sidelying, Sidelying to Sit Rolling: Mod assist Sidelying to sit: Mod assist, +2 for physical assistance     Sit to sidelying: Mod assist General bed mobility comments: Patient requires hand over hand cuing for rolling; assist to take legs over EOB and raise torso to sit; able to lower her torso down to mattress and lifts RLE onto bed, initiates lifting LLE but needs assist to get onto bed    Transfers Overall transfer level: Needs assistance Equipment used: 2 person hand held assist Transfers: Sit to/from Stand Sit to Stand: Max assist, +2 physical assistance           General transfer comment: attempted to assist pt to come to stand x 2 reps with no initiation by pt; afterward pt stated she was going to get into the chair and attempted to stand by herself with partial clearing of buttocks off the mattress--pt repeated x 3     Balance Overall balance assessment: Needs assistance Sitting-balance support: Feet supported, Single extremity supported Sitting balance-Leahy Scale: Zero Sitting balance - Comments: Heavy rightward lean, at times LOB posteriorly without righting reactions. Very briefly sat unsupported while using RUE to hold and stabilize but unable to remain focused long                                   ADL either performed or assessed with clinical judgement   ADL Overall ADL's : Needs assistance/impaired     Grooming: Wash/dry hands;Sitting;Set up (assist for sitting balance)  Upper Body Dressing : Maximal assistance;Sitting Upper Body Dressing Details (indicate cue type and reason): cues and physical assist to change soiled gown, assist for sitting balance at EOB         Toileting- Clothing Manipulation and Hygiene:  Total assistance;Bed level Toileting - Clothing Manipulation Details (indicate cue type and reason): to clean following a BM            Extremity/Trunk Assessment              Vision       Perception     Praxis      Cognition Arousal/Alertness: Awake/alert Behavior During Therapy: Flat affect Overall Cognitive Status: Impaired/Different from baseline Area of Impairment: Orientation, Attention, Memory, Following commands, Safety/judgement, Awareness, Problem solving                 Orientation Level: Disoriented to, Time, Place, Situation Current Attention Level: Sustained, Focused Memory: Decreased recall of precautions, Decreased short-term memory Following Commands: Follows one step commands with increased time, Follows one step commands inconsistently Safety/Judgement: Decreased awareness of safety, Decreased awareness of deficits Awareness: Intellectual Problem Solving: Difficulty sequencing, Requires verbal cues, Requires tactile cues, Slow processing, Decreased initiation General Comments: jumps from topic to topic; not responding to orientation questions; poor awareness of left side        Exercises      Shoulder Instructions       General Comments Pt highly internally distracted with difficulty keeping her focused on therapeutic tasks.    Pertinent Vitals/ Pain       Pain Assessment Pain Assessment: Faces Faces Pain Scale: No hurt  Home Living                                          Prior Functioning/Environment              Frequency  Min 2X/week        Progress Toward Goals  OT Goals(current goals can now be found in the care plan section)  Progress towards OT goals: Progressing toward goals  Acute Rehab OT Goals OT Goal Formulation: With patient Time For Goal Achievement: 03/17/22 Potential to Achieve Goals: [place] Discharge plan remains appropriate    Co-evaluation    PT/OT/SLP  Co-Evaluation/Treatment: Yes Reason for Co-Treatment: For patient/therapist safety;Necessary to address cognition/behavior during functional activity   OT goals addressed during session: ADL's and self-care      AM-PAC OT "6 Clicks" Daily Activity     Outcome Measure   Help from another person eating meals?: A Little Help from another person taking care of personal grooming?: A Little Help from another person toileting, which includes using toliet, bedpan, or urinal?: Total Help from another person bathing (including washing, rinsing, drying)?: A Lot Help from another person to put on and taking off regular upper body clothing?: A Lot Help from another person to put on and taking off regular lower body clothing?: Total 6 Click Score: 12    End of Session Equipment Utilized During Treatment: Gait belt  OT Visit Diagnosis: Unsteadiness on feet (R26.81);Other abnormalities of gait and mobility (R26.89);History of falling (Z91.81);Other symptoms and signs involving cognitive function   Activity Tolerance Patient tolerated treatment well   Patient Left in bed;with call bell/phone within reach;with bed alarm set   Nurse Communication Other (comment) (NT--needs new purewick)  Time: 7276-1848 OT Time Calculation (min): 31 min  Charges: OT General Charges $OT Visit: 1 Visit OT Treatments $Self Care/Home Management : 8-22 mins Cleta Alberts, OTR/L Acute Rehabilitation Services Office: [phone]   Malka So 03/12/2022, 11:05 AM

## 2022-03-13 DIAGNOSIS — I2609 Other pulmonary embolism with acute cor pulmonale: Secondary | ICD-10-CM | POA: Diagnosis not present
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
## 2022-03-14 DIAGNOSIS — I2609 Other pulmonary embolism with acute cor pulmonale: Secondary | ICD-10-CM | POA: Diagnosis not present

## 2022-03-14 LAB — GLUCOSE, CAPILLARY: Glucose-Capillary: 165 mg/dL — ABNORMAL HIGH (ref 70–99)

## 2022-03-14 NOTE — Progress Notes (Signed)
PROGRESS NOTE    Jennifer Pacheco  URK:[id number] DOB: 12-13-1955 DOA: 01/30/2022 PCP: [name], NP     Brief Narrative:  Patient is a 66 y.o.  female with history of DM-2, HTN who presented to [facility] ED on 6/2 after she was found confused/down in feces/urine-she was hypotensive/hypoxic-she was found to have a submassive PE-CT head showed right basal ganglia mass with mass effect-she was transferred to [place] she was monitored and started on anticoagulation.  Unfortunately further hospital course was complicated by development of a retroperitoneal bleed and acute blood loss anemia.  Heparin was discontinued-IVC filter was placed-and upon further stability-patient was transferred to [facility].  Upon further neuroimaging studies-brain mass was consistent with a meningioma.  Further hospital course was complicated by persistent cognitive dysfunction (likely chronic-social worker working on guardianship) and development of significant LLE swelling with massive clot burden.  After discussion with vascular surgery-she was rechallenged with anticoagulation which she has tolerated without any signs of rebleeding.  Currently remains medically stable-and awaiting SNF/guardianship process.   Significant studies: 6/2>> CTA chest: PE with CT evidence of RV strain. 6/2>> CT abdomen/pelvis: Cholelithiasis, CT findings of left pyelonephritis, esophageal/patulous/thickened distally 6/2>> CT head: Large hypodense/irregular mass in the right basal ganglia/right insula-with surrounding edema. 6/3>> RUQ ultrasound: Cholelithiasis/mild pericholecystic fluid/mild gallbladder wall thickening. 6/3>> MRI brain no contrast: 5.2 x 5.3 cm mass on the right-with mass effect and vasogenic edema 6/3>> Echo: EF 83-15%, RV systolic function normal 1/7>> CT angio GI bleed: No areas of active extravasation-large left-sided retroperitoneal hematoma. 6/14>> MRI brain with/without contrast: 5.9 x 5.4 x 5.9 enhancing mass in  the right frontotemporal/periinsular region-favored extra-axial in origin and favored to reflect a meningioma. 6/23>> CT head: Large meningioma involving the right frontotemporal region with associated vasogenic edema/regional mass effect 7/5>> acute DVT involving left common iliac, external iliac, common femoral, left femoral, left proximal profunda, left popliteal, left posterior tibial, left peroneal, left gastrocnemius  Procedures: 6/5>> IVC filter by IR   Consults: PCCM, neurosurgery, GI, IR, palliative care, vascular surgery  New events last 24 hours / Subjective: No acute events reported.   Assessment & Plan:   Principal Problem:   Acute pulmonary embolism with acute cor pulmonale (HCC) Active Problems:   ABLA likely due to retroperitoneal bleed   Acute metabolic encephalopathy   Brain mass with vasogenic edema and mass effect   Esophageal dysphagia   Hypovolemic shock (HCC)   Acute respiratory failure with hypoxia (HCC)   AKI (acute kidney injury) (HCC)   Controlled NIDDM-2   Physical deconditioning   Pressure injury of skin   Submassive PE with acute cor pulmonale: Did not tolerate IV heparin on initial presentation and developed a retroperitoneal bleed. IVC filter placed.  Unfortunately over the past several days has developed a significant DVT in LLE.  Now stable on eliquis.    LLE DVT: Significant clot burden in LLE on Doppler ultrasound on 7/5. Difficult situation, has IVC filter in place, previous MD discussed with vascular surgery Dr. Stanford Breed on 7/6. Deemed not a candidate for thrombectomy, recommendations were to rechallenge with IV heparin which she tolerated well for 72 hours. Now transitioned to Eliquis on 7/9.  Check CBC periodically    Acute blood loss anemia due to retroperitoneal bleed: Required several units of PRBC transfusion.  Now stable.    Acute metabolic encephalopathy: Multifactorial due to acute illness/brain mass, suspicion for some underlying  cognitive issue (per prior note-estranged from family-does strange things-fails to recognize her brother at  times). Apparently has some impaired hearing which could contribute. Vitamin B12/TSH/RPR all stable.    Large right basal ganglia mass with surrounding edema/11 mm midline shift: Continues to have left hemiparesis. Continue steroids. Per prior notes, now thought to be a meningioma. Neurosurgery recommending outpatient follow-up in 2-3 months for reevaluation/craniotomy.  Previously MD spoke with neurosurgeon Dr. Reatha Armour on 7/6. No contraindication for anticoagulation. Suggest that we slowly taper off Decadron in 2 weeks.   Multifactorial shock-due to hypovolemia/submassive PE/septic shock (pyelonephritis): Completed antibiotics. BP stable.    Acute hypoxic respiratory failure: Resolved.   Distal esophageal thickening: Noted on CT. Evaluated by GI, recommendations are for PPI. EGD deferred for now, could be possibly done in the outpatient setting.   DM-2 (A1c 5.5 on 6/2): CBG stable   Deconditioning: Evaluated by therapy. Recommendations are for SNF.   Other issues: APS involved-social work following-guardianship process underway-as patient does not have capacity.  Discussed with social work Arbie Cookey on 7/6-no family member wants to make any decisions   DVT prophylaxis:  Place and maintain sequential compression device Start: 02/02/22 1450 apixaban (ELIQUIS) tablet 5 mg  Code Status: Full Family Communication: None  Disposition Plan:  Status is: Inpatient Remains inpatient appropriate because: guardianship and placement pending.    Antimicrobials:  Anti-infectives (From admission, onward)    Start     Dose/Rate Route Frequency Ordered Stop   02/03/22 1715  levofloxacin (LEVAQUIN) tablet 500 mg  Status:  Discontinued        500 mg Oral Daily 02/03/22 1627 02/05/22 1355   02/02/22 2045  levofloxacin (LEVAQUIN) IVPB 500 mg  Status:  Discontinued       Note to Pharmacy: Adjust as  needed - switching to IV since unable to take PO   500 mg 100 mL/hr over 60 Minutes Intravenous Every 24 hours 02/02/22 1959 02/03/22 1627   02/01/22 1400  levofloxacin (LEVAQUIN) tablet 500 mg  Status:  Discontinued        500 mg Oral Daily 02/01/22 0911 02/02/22 1959   01/31/22 1000  vancomycin (VANCOREADY) IVPB 750 mg/150 mL  Status:  Discontinued        750 mg 150 mL/hr over 60 Minutes Intravenous Every 12 hours 01/31/22 0003 01/31/22 0848   01/31/22 0200  vancomycin (VANCOREADY) IVPB 1500 mg/300 mL  Status:  Discontinued        1,500 mg 150 mL/hr over 120 Minutes Intravenous Every 12 hours 01/30/22 1241 01/30/22 2348   01/30/22 2100  aztreonam (AZACTAM) 2 g in sodium chloride 0.9 % 100 mL IVPB  Status:  Discontinued        2 g 200 mL/hr over 30 Minutes Intravenous Every 8 hours 01/30/22 1240 02/01/22 0923   01/30/22 1245  aztreonam (AZACTAM) 2 g in sodium chloride 0.9 % 100 mL IVPB        2 g 200 mL/hr over 30 Minutes Intravenous  Once 01/30/22 1231 01/30/22 1408   01/30/22 1245  metroNIDAZOLE (FLAGYL) IVPB 500 mg        500 mg 100 mL/hr over 60 Minutes Intravenous  Once 01/30/22 1231 01/30/22 1408   01/30/22 1245  vancomycin (VANCOCIN) IVPB 1000 mg/200 mL premix  Status:  Discontinued        1,000 mg 200 mL/hr over 60 Minutes Intravenous  Once 01/30/22 1231 01/30/22 1239   01/30/22 1245  vancomycin (VANCOREADY) IVPB 2000 mg/400 mL        2,000 mg 200 mL/hr over 120 Minutes Intravenous  Once 01/30/22 1239  01/30/22 1607        Objective: Vitals:   03/13/22 1728 03/13/22 2039 03/14/22 0454 03/14/22 0937  BP: 112/69 119/81 113/85 117/72  Pulse: 82 84 72 70  Resp: '18 16 16 19  '$ Temp: 97.9 F (36.6 C) 98.7 F (37.1 C) 97.8 F (36.6 C) 98.1 F (36.7 C)  TempSrc: Oral Oral Oral   SpO2: 95% 95% 95% 99%  Weight:      Height:        Intake/Output Summary (Last 24 hours) at 03/14/2022 1236 Last data filed at 03/14/2022 0800 Gross per 24 hour  Intake 894 ml  Output 650 ml   Net 244 ml   Filed Weights   03/09/22 0533 03/12/22 0733 03/13/22 0510  Weight: 68.9 kg 68.9 kg 70 kg    Examination:  General exam: Appears calm and comfortable  Respiratory system: Clear to auscultation. Respiratory effort normal. No respiratory distress.  Cardiovascular system: S1 & S2 heard, RRR.  Gastrointestinal system: Abdomen is nondistended, soft and nontender.  Central nervous system: Alert  Extremities: Symmetric in appearance   Data Reviewed: I have personally reviewed following labs and imaging studies  CBC: Recent Labs  Lab 03/08/22 0256 03/09/22 0034  WBC 10.7* 10.9*  HGB 13.2 12.7  HCT 40.4 40.0  MCV 89.0 89.7  PLT 266 409   Basic Metabolic Panel: No results for input(s): "NA", "K", "CL", "CO2", "GLUCOSE", "BUN", "CREATININE", "CALCIUM", "MG", "PHOS" in the last 168 hours. GFR: Estimated Creatinine Clearance: 68.8 mL/min (by C-G formula based on SCr of 0.61 mg/dL). Liver Function Tests: No results for input(s): "AST", "ALT", "ALKPHOS", "BILITOT", "PROT", "ALBUMIN" in the last 168 hours. No results for input(s): "LIPASE", "AMYLASE" in the last 168 hours. No results for input(s): "AMMONIA" in the last 168 hours. Coagulation Profile: No results for input(s): "INR", "PROTIME" in the last 168 hours. Cardiac Enzymes: No results for input(s): "CKTOTAL", "CKMB", "CKMBINDEX", "TROPONINI" in the last 168 hours. BNP (last 3 results) No results for input(s): "PROBNP" in the last 8760 hours. HbA1C: No results for input(s): "HGBA1C" in the last 72 hours. CBG: Recent Labs  Lab 03/08/22 0737  GLUCAP 103*   Lipid Profile: No results for input(s): "CHOL", "HDL", "LDLCALC", "TRIG", "CHOLHDL", "LDLDIRECT" in the last 72 hours. Thyroid Function Tests: No results for input(s): "TSH", "T4TOTAL", "FREET4", "T3FREE", "THYROIDAB" in the last 72 hours. Anemia Panel: No results for input(s): "VITAMINB12", "FOLATE", "FERRITIN", "TIBC", "IRON", "RETICCTPCT" in the last 72  hours. Sepsis Labs: No results for input(s): "PROCALCITON", "LATICACIDVEN" in the last 168 hours.  Recent Results (from the past 240 hour(s))  MRSA Next Gen by PCR, Nasal     Status: None   Collection Time: 03/12/22  7:03 AM   Specimen: Nasal Mucosa; Nasal Swab  Result Value Ref Range Status   MRSA by PCR Next Gen NOT DETECTED NOT DETECTED Final    Comment: (NOTE) The GeneXpert MRSA Assay (FDA approved for NASAL specimens only), is one component of a comprehensive MRSA colonization surveillance program. It is not intended to diagnose MRSA infection nor to guide or monitor treatment for MRSA infections. Test performance is not FDA approved in patients less than 53 years old. Performed at [facility], [place] [address], [place], [place] [address]       Radiology Studies: No results found.    Scheduled Meds:  apixaban  5 mg Oral BID   dexamethasone  3 mg Oral Daily   docusate sodium  100 mg Oral BID  feeding supplement  1 Container Oral TID BM   gabapentin  300 mg Oral BID   pantoprazole  40 mg Oral BID   polyethylene glycol  17 g Oral BID   senna  1 tablet Oral QHS   Continuous Infusions:  sodium chloride Stopped (02/02/22 0929)     LOS: 43 days     Dessa Phi, DO [facility] 03/14/2022, 12:36 PM   Available via Epic secure chat 7am-7pm After these hours, please refer to coverage provider listed on [email or web address]

## 2022-03-15 DIAGNOSIS — I2609 Other pulmonary embolism with acute cor pulmonale: Secondary | ICD-10-CM | POA: Diagnosis not present

## 2022-03-15 NOTE — Progress Notes (Signed)
PROGRESS NOTE    Kisa Fujii  HGD:[id number] DOB: 08/26/1956 DOA: 01/30/2022 PCP: [name], NP     Brief Narrative:  Patient is a 66 y.o.  female with history of DM-2, HTN who presented to [facility] ED on 6/2 after she was found confused/down in feces/urine-she was hypotensive/hypoxic-she was found to have a submassive PE-CT head showed right basal ganglia mass with mass effect-she was transferred to [place] she was monitored and started on anticoagulation.  Unfortunately further hospital course was complicated by development of a retroperitoneal bleed and acute blood loss anemia.  Heparin was discontinued-IVC filter was placed-and upon further stability-patient was transferred to [facility].  Upon further neuroimaging studies-brain mass was consistent with a meningioma.  Further hospital course was complicated by persistent cognitive dysfunction (likely chronic-social worker working on guardianship) and development of significant LLE swelling with massive clot burden.  After discussion with vascular surgery-she was rechallenged with anticoagulation which she has tolerated without any signs of rebleeding.  Currently remains medically stable-and awaiting SNF/guardianship process.   Significant studies: 6/2>> CTA chest: PE with CT evidence of RV strain. 6/2>> CT abdomen/pelvis: Cholelithiasis, CT findings of left pyelonephritis, esophageal/patulous/thickened distally 6/2>> CT head: Large hypodense/irregular mass in the right basal ganglia/right insula-with surrounding edema. 6/3>> RUQ ultrasound: Cholelithiasis/mild pericholecystic fluid/mild gallbladder wall thickening. 6/3>> MRI brain no contrast: 5.2 x 5.3 cm mass on the right-with mass effect and vasogenic edema 6/3>> Echo: EF 96-22%, RV systolic function normal 2/9>> CT angio GI bleed: No areas of active extravasation-large left-sided retroperitoneal hematoma. 6/14>> MRI brain with/without contrast: 5.9 x 5.4 x 5.9 enhancing mass in  the right frontotemporal/periinsular region-favored extra-axial in origin and favored to reflect a meningioma. 6/23>> CT head: Large meningioma involving the right frontotemporal region with associated vasogenic edema/regional mass effect 7/5>> acute DVT involving left common iliac, external iliac, common femoral, left femoral, left proximal profunda, left popliteal, left posterior tibial, left peroneal, left gastrocnemius  Procedures: 6/5>> IVC filter by IR   Consults: PCCM, neurosurgery, GI, IR, palliative care, vascular surgery  New events last 24 hours / Subjective: No acute event reported overnight. Pt sleeping soundly this morning.   Assessment & Plan:   Principal Problem:   Acute pulmonary embolism with acute cor pulmonale (HCC) Active Problems:   ABLA likely due to retroperitoneal bleed   Acute metabolic encephalopathy   Brain mass with vasogenic edema and mass effect   Esophageal dysphagia   Hypovolemic shock (HCC)   Acute respiratory failure with hypoxia (HCC)   AKI (acute kidney injury) (HCC)   Controlled NIDDM-2   Physical deconditioning   Pressure injury of skin   Submassive PE with acute cor pulmonale: Did not tolerate IV heparin on initial presentation and developed a retroperitoneal bleed. IVC filter placed.  Unfortunately over the past several days has developed a significant DVT in LLE.  Now stable on eliquis.    LLE DVT: Significant clot burden in LLE on Doppler ultrasound on 7/5. Difficult situation, has IVC filter in place, previous MD discussed with vascular surgery Dr. Stanford Breed on 7/6. Deemed not a candidate for thrombectomy, recommendations were to rechallenge with IV heparin which she tolerated well for 72 hours. Now transitioned to Eliquis on 7/9.  Check CBC periodically    Acute blood loss anemia due to retroperitoneal bleed: Required several units of PRBC transfusion.  Now stable.    Acute metabolic encephalopathy: Multifactorial due to acute  illness/brain mass, suspicion for some underlying cognitive issue (per prior note-estranged from family-does strange  things-fails to recognize her brother at times). Apparently has some impaired hearing which could contribute. Vitamin B12/TSH/RPR all stable.    Large right basal ganglia mass with surrounding edema/11 mm midline shift: Continues to have left hemiparesis. Continue steroids. Per prior notes, now thought to be a meningioma. Neurosurgery recommending outpatient follow-up in 2-3 months for reevaluation/craniotomy.  Previously MD spoke with neurosurgeon Dr. Reatha Armour on 7/6. No contraindication for anticoagulation. Suggest that we slowly taper off Decadron in 2 weeks.   Multifactorial shock-due to hypovolemia/submassive PE/septic shock (pyelonephritis): Completed antibiotics. BP stable.    Acute hypoxic respiratory failure: Resolved.   Distal esophageal thickening: Noted on CT. Evaluated by GI, recommendations are for PPI. EGD deferred for now, could be possibly done in the outpatient setting.   DM-2 (A1c 5.5 on 6/2): CBG stable   Deconditioning: Evaluated by therapy. Recommendations are for SNF.   Other issues: APS involved-social work following-guardianship process underway-as patient does not have capacity.  Discussed with social work Arbie Cookey on 7/6-no family member wants to make any decisions   DVT prophylaxis:  Place and maintain sequential compression device Start: 02/02/22 1450 apixaban (ELIQUIS) tablet 5 mg  Code Status: Full Family Communication: None  Disposition Plan:  Status is: Inpatient Remains inpatient appropriate because: guardianship and placement pending.    Antimicrobials:  Anti-infectives (From admission, onward)    Start     Dose/Rate Route Frequency Ordered Stop   02/03/22 1715  levofloxacin (LEVAQUIN) tablet 500 mg  Status:  Discontinued        500 mg Oral Daily 02/03/22 1627 02/05/22 1355   02/02/22 2045  levofloxacin (LEVAQUIN) IVPB 500 mg  Status:   Discontinued       Note to Pharmacy: Adjust as needed - switching to IV since unable to take PO   500 mg 100 mL/hr over 60 Minutes Intravenous Every 24 hours 02/02/22 1959 02/03/22 1627   02/01/22 1400  levofloxacin (LEVAQUIN) tablet 500 mg  Status:  Discontinued        500 mg Oral Daily 02/01/22 0911 02/02/22 1959   01/31/22 1000  vancomycin (VANCOREADY) IVPB 750 mg/150 mL  Status:  Discontinued        750 mg 150 mL/hr over 60 Minutes Intravenous Every 12 hours 01/31/22 0003 01/31/22 0848   01/31/22 0200  vancomycin (VANCOREADY) IVPB 1500 mg/300 mL  Status:  Discontinued        1,500 mg 150 mL/hr over 120 Minutes Intravenous Every 12 hours 01/30/22 1241 01/30/22 2348   01/30/22 2100  aztreonam (AZACTAM) 2 g in sodium chloride 0.9 % 100 mL IVPB  Status:  Discontinued        2 g 200 mL/hr over 30 Minutes Intravenous Every 8 hours 01/30/22 1240 02/01/22 0923   01/30/22 1245  aztreonam (AZACTAM) 2 g in sodium chloride 0.9 % 100 mL IVPB        2 g 200 mL/hr over 30 Minutes Intravenous  Once 01/30/22 1231 01/30/22 1408   01/30/22 1245  metroNIDAZOLE (FLAGYL) IVPB 500 mg        500 mg 100 mL/hr over 60 Minutes Intravenous  Once 01/30/22 1231 01/30/22 1408   01/30/22 1245  vancomycin (VANCOCIN) IVPB 1000 mg/200 mL premix  Status:  Discontinued        1,000 mg 200 mL/hr over 60 Minutes Intravenous  Once 01/30/22 1231 01/30/22 1239   01/30/22 1245  vancomycin (VANCOREADY) IVPB 2000 mg/400 mL        2,000 mg 200 mL/hr over 120  Minutes Intravenous  Once 01/30/22 1239 01/30/22 1607        Objective: Vitals:   03/14/22 1701 03/14/22 2102 03/15/22 0516 03/15/22 0931  BP: 93/61 107/77 120/89 101/65  Pulse: 98 80 (!) 58 67  Resp: '19 18 18 16  '$ Temp: 98.3 F (36.8 C) 98 F (36.7 C) 98.3 F (36.8 C) 98.3 F (36.8 C)  TempSrc:  Oral Oral Oral  SpO2: 95% 96% 100% 96%  Weight:      Height:        Intake/Output Summary (Last 24 hours) at 03/15/2022 1234 Last data filed at 03/15/2022  0654 Gross per 24 hour  Intake 620 ml  Output 1250 ml  Net -630 ml    Filed Weights   03/09/22 0533 03/12/22 0733 03/13/22 0510  Weight: 68.9 kg 68.9 kg 70 kg    Examination:  General exam: Appears calm and comfortable   Data Reviewed: I have personally reviewed following labs and imaging studies  CBC: Recent Labs  Lab 03/09/22 0034  WBC 10.9*  HGB 12.7  HCT 40.0  MCV 89.7  PLT 833    Basic Metabolic Panel: No results for input(s): "NA", "K", "CL", "CO2", "GLUCOSE", "BUN", "CREATININE", "CALCIUM", "MG", "PHOS" in the last 168 hours. GFR: Estimated Creatinine Clearance: 68.8 mL/min (by C-G formula based on SCr of 0.61 mg/dL). Liver Function Tests: No results for input(s): "AST", "ALT", "ALKPHOS", "BILITOT", "PROT", "ALBUMIN" in the last 168 hours. No results for input(s): "LIPASE", "AMYLASE" in the last 168 hours. No results for input(s): "AMMONIA" in the last 168 hours. Coagulation Profile: No results for input(s): "INR", "PROTIME" in the last 168 hours. Cardiac Enzymes: No results for input(s): "CKTOTAL", "CKMB", "CKMBINDEX", "TROPONINI" in the last 168 hours. BNP (last 3 results) No results for input(s): "PROBNP" in the last 8760 hours. HbA1C: No results for input(s): "HGBA1C" in the last 72 hours. CBG: Recent Labs  Lab 03/14/22 2059  GLUCAP 165*    Lipid Profile: No results for input(s): "CHOL", "HDL", "LDLCALC", "TRIG", "CHOLHDL", "LDLDIRECT" in the last 72 hours. Thyroid Function Tests: No results for input(s): "TSH", "T4TOTAL", "FREET4", "T3FREE", "THYROIDAB" in the last 72 hours. Anemia Panel: No results for input(s): "VITAMINB12", "FOLATE", "FERRITIN", "TIBC", "IRON", "RETICCTPCT" in the last 72 hours. Sepsis Labs: No results for input(s): "PROCALCITON", "LATICACIDVEN" in the last 168 hours.  Recent Results (from the past 240 hour(s))  MRSA Next Gen by PCR, Nasal     Status: None   Collection Time: 03/12/22  7:03 AM   Specimen: Nasal Mucosa;  Nasal Swab  Result Value Ref Range Status   MRSA by PCR Next Gen NOT DETECTED NOT DETECTED Final    Comment: (NOTE) The GeneXpert MRSA Assay (FDA approved for NASAL specimens only), is one component of a comprehensive MRSA colonization surveillance program. It is not intended to diagnose MRSA infection nor to guide or monitor treatment for MRSA infections. Test performance is not FDA approved in patients less than 65 years old. Performed at [facility], [place] [address] [place],  [address]       Radiology Studies: No results found.    Scheduled Meds:  apixaban  5 mg Oral BID   dexamethasone  3 mg Oral Daily   docusate sodium  100 mg Oral BID   feeding supplement  1 Container Oral TID BM   gabapentin  300 mg Oral BID   pantoprazole  40 mg Oral BID   polyethylene glycol  17 g Oral BID   senna  1 tablet Oral QHS   Continuous Infusions:  sodium chloride Stopped (02/02/22 0929)     LOS: 44 days     Dessa Phi, DO [facility] 03/15/2022, 12:34 PM   Available via Epic secure chat 7am-7pm After these hours, please refer to coverage provider listed on [email or web address]

## 2022-03-15 NOTE — Plan of Care (Signed)
  Problem: Coping: Goal: Ability to adjust to condition or change in health will improve Outcome: Completed/Met   Problem: Fluid Volume: Goal: Ability to maintain a balanced intake and output will improve Outcome: Completed/Met   Problem: Health Behavior/Discharge Planning: Goal: Ability to identify and utilize available resources and services will improve Outcome: Completed/Met   Problem: Metabolic: Goal: Ability to maintain appropriate glucose levels will improve Outcome: Completed/Met   Problem: Nutritional: Goal: Maintenance of adequate nutrition will improve Outcome: Completed/Met Goal: Progress toward achieving an optimal weight will improve Outcome: Completed/Met

## 2022-03-16 DIAGNOSIS — I2609 Other pulmonary embolism with acute cor pulmonale: Secondary | ICD-10-CM | POA: Diagnosis not present

## 2022-03-16 LAB — CBC
HCT: 39.5 % (ref 36.0–46.0)
Hemoglobin: 13 g/dL (ref 12.0–15.0)
MCH: 29.4 pg (ref 26.0–34.0)
MCHC: 32.9 g/dL (ref 30.0–36.0)
MCV: 89.4 fL (ref 80.0–100.0)
Platelets: 385 10*3/uL (ref 150–400)
RBC: 4.42 MIL/uL (ref 3.87–5.11)
RDW: 18.2 % — ABNORMAL HIGH (ref 11.5–15.5)
WBC: 10 10*3/uL (ref 4.0–10.5)
nRBC: 0 % (ref 0.0–0.2)

## 2022-03-16 LAB — BASIC METABOLIC PANEL
Anion gap: 6 (ref 5–15)
BUN: 21 mg/dL (ref 8–23)
CO2: 27 mmol/L (ref 22–32)
Calcium: 8.7 mg/dL — ABNORMAL LOW (ref 8.9–10.3)
Chloride: 103 mmol/L (ref 98–111)
Creatinine, Ser: 0.64 mg/dL (ref 0.44–1.00)
GFR, Estimated: >60 mL/min (ref >60)
Glucose, Bld: 106 mg/dL — ABNORMAL HIGH (ref 70–99)
Potassium: 3.7 mmol/L (ref 3.5–5.1)
Sodium: 136 mmol/L (ref 135–145)

## 2022-03-16 NOTE — Progress Notes (Signed)
Physical Therapy Treatment and Discharge Patient Details Name: Jennifer Pacheco MRN: [id number] DOB: 1956/04/28 Today's Date: 03/16/2022   History of Present Illness Pt is 66 yo female who was found down in her home and taken to [facility]. 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 Pt with retroperitoneal bleed and IVC filter placed 6/5. 7/5>> acute DVT involving left common iliac, external iliac, common femoral, left femoral, left proximal profunda, left popliteal, left posterior tibial, left peroneal, left gastrocnemius PMH: DM2, HTN.    PT Comments    Pt supine in bed, incontinent of stool, waving soiled Purewick around. Despite maximal multimodal cuing, pt keeps trying to put hand in stool. Attempted to have pt stand for pericare prior to transfer to recliner. Pt requires  maxAx2 to come to EoB, unable to maintain to attempt to stand requires total A for return to bed and rolling to clean up. Pt did not follow a single command throughout session and has not the past 2 sessions. As such pt can not participate in therapy and PT discharging from service. D/c plan has been changed to long term care.     Recommendations for follow up therapy are one component of a multi-disciplinary discharge planning process, led by the attending physician.  Recommendations may be updated based on patient status, additional functional criteria and insurance authorization.  Follow Up Recommendations  Long-term institutional care without follow-up therapy Can patient physically be transported by private vehicle: No   Assistance Recommended at Discharge Frequent or constant Supervision/Assistance  Patient can return home with the following Assistance with cooking/housework;Direct supervision/assist for medications management;Direct supervision/assist for financial management;Assist for transportation;Help with stairs or ramp for entrance;Two people to help with walking and/or  transfers;Two people to help with bathing/dressing/bathroom   Equipment Recommendations  Rolling walker (2 wheels);BSC/3in1 (pending progress)       Precautions / Restrictions Precautions Precautions: Fall Precaution Comments: HOH Restrictions Weight Bearing Restrictions: No     Mobility  Bed Mobility Overal bed mobility: Needs Assistance Bed Mobility: Rolling, Sit to Sidelying, Sidelying to Sit Rolling: Max assist   Supine to sit: Max assist Sit to supine: Total assist   General bed mobility comments: to minimize mess, attempted to have pt stand for pericare using bed pad and maxA pt brought to EoB, unable to follow any commands to stand for pericare, requires total A for return to bed and maxA for turning for pericare    Transfers                   General transfer comment: unable    Ambulation/Gait               General Gait Details: unable to stand       Balance Overall balance assessment: Needs assistance Sitting-balance support: Feet supported, Single extremity supported Sitting balance-Leahy Scale: Zero Sitting balance - Comments: needs outside support to maintain upright due to R posterior lean Postural control: Right lateral lean, Posterior lean                                  Cognition Arousal/Alertness: Awake/alert Behavior During Therapy: Flat affect Overall Cognitive Status: Impaired/Different from baseline Area of Impairment: Orientation, Attention, Memory, Following commands, Safety/judgement, Awareness, Problem solving                 Orientation Level: Disoriented to, Time, Place, Situation (  would not attend to questions and answer) Current Attention Level: Sustained, Focused Memory: Decreased recall of precautions, Decreased short-term memory Following Commands: Follows one step commands with increased time, Follows one step commands inconsistently Safety/Judgement: Decreased awareness of safety, Decreased  awareness of deficits Awareness:  (pre-intellectual) Problem Solving: Difficulty sequencing, Requires verbal cues, Requires tactile cues, Slow processing, Decreased initiation General Comments: pt found incontinent of stool,requires physical assist to keep from putting hands in stool,  would not follow any commands during session           General Comments General comments (skin integrity, edema, etc.): Pt with no command follow, internally distracted      Pertinent Vitals/Pain Pain Assessment Pain Assessment: Faces Faces Pain Scale: Hurts little more Breathing: normal Negative Vocalization: none Facial Expression: smiling or inexpressive Body Language: relaxed Consolability: no need to console PAINAD Score: 0 Pain Location: generalized with movement Pain Descriptors / Indicators: Grimacing, Guarding Pain Intervention(s): Limited activity within patient's tolerance, Monitored during session, Repositioned     PT Goals (current goals can now be found in the care plan section) Acute Rehab PT Goals Patient Stated Goal: unable to state; highly distracted PT Goal Formulation: Patient unable to participate in goal setting Time For Goal Achievement: 03/16/22 Potential to Achieve Goals: Poor Progress towards PT goals: Not progressing toward goals - comment (does not follow commands)    Frequency    Min 2X/week      PT Plan Current plan remains appropriate;Frequency needs to be updated       AM-PAC PT "6 Clicks" Mobility   Outcome Measure  Help needed turning from your back to your side while in a flat bed without using bedrails?: Total Help needed moving from lying on your back to sitting on the side of a flat bed without using bedrails?: Total Help needed moving to and from a bed to a chair (including a wheelchair)?: Total Help needed standing up from a chair using your arms (e.g., wheelchair or bedside chair)?: Total Help needed to walk in hospital room?: Total Help  needed climbing 3-5 steps with a railing? : Total 6 Click Score: 6    End of Session Equipment Utilized During Treatment: Gait belt Activity Tolerance: Treatment limited secondary to medical complications (Comment) (limited due to decreased cognition) Patient left: in bed;with bed alarm set;with call bell/phone within reach Nurse Communication: Mobility status PT Visit Diagnosis: Unsteadiness on feet (R26.81);Difficulty in walking, not elsewhere classified (R26.2)     Time: 3474-2595 PT Time Calculation (min) (ACUTE ONLY): 19 min  Charges:  $Therapeutic Activity: 8-22 mins                     Kyrillos Adams B. Migdalia Dk PT, DPT Acute Rehabilitation Services Please use secure chat or  Call Office [phone]    [place] 03/16/2022, 3:07 PM

## 2022-03-16 NOTE — Plan of Care (Signed)
  Problem: Skin Integrity: Goal: Risk for impaired skin integrity will decrease Outcome: Progressing   

## 2022-03-16 NOTE — TOC Progression Note (Addendum)
Transition of Care [facility]) - Progression Note    Patient Details  Name: Jennifer Pacheco MRN: [id number] Date of Birth: 11-16-1955  Transition of Care [facility]) CM/SW [place], RN Phone Number: 03/16/2022, 2:04 PM  Clinical Narrative:    CM called and left a voicemail message with Doreen Beam, DSS SW with [facility].  Interim guardianship court date has been scheduled for 03/26/2022 at this time.  Updated clinicals including recent medical progress notes, FL2 sent to Amalia Hailey, SW at [email or web address].  03/16/2022 1515 - I spoke with Eustaquio Maize, PT and the patient is not able to follow simple commands to participate in PT sessions and patient will likely need LTC placement after guardianship hearing scheduled for 03/26/2022 at 10 am.  CM and MSW will continue to follow the patient for SNF placement.   Expected Discharge Plan: (P) Skilled Nursing Facility Barriers to Discharge: (P) Continued Medical Work up, Unsafe home situation, Family Issues  Expected Discharge Plan and Services Expected Discharge Plan: (P) [place] In-house Referral: Clinical Social Work, Hospice / [place] Acute Care Choice: [place] Living arrangements for the past 2 months: Single Family Home                                       Social Determinants of Health (SDOH) Interventions    Readmission Risk Interventions    02/11/2022    2:16 PM  Readmission Risk Prevention Plan  Transportation Screening Complete  PCP or Specialist Appt within 5-7 Days Complete  Home Care Screening Complete  Medication Review (RN CM) Complete

## 2022-03-16 NOTE — Progress Notes (Signed)
PROGRESS NOTE    Jennifer Pacheco  HYI:[id number] DOB: 07/18/1956 DOA: 01/30/2022 PCP: [name], NP     Brief Narrative:  Patient is a 66 y.o.  female with history of DM-2, HTN who presented to [facility] ED on 6/2 after she was found confused/down in feces/urine-she was hypotensive/hypoxic-she was found to have a submassive PE-CT head showed right basal ganglia mass with mass effect-she was transferred to [place] she was monitored and started on anticoagulation.  Unfortunately further hospital course was complicated by development of a retroperitoneal bleed and acute blood loss anemia.  Heparin was discontinued-IVC filter was placed-and upon further stability-patient was transferred to [facility].  Upon further neuroimaging studies-brain mass was consistent with a meningioma.  Further hospital course was complicated by persistent cognitive dysfunction (likely chronic-social worker working on guardianship) and development of significant LLE swelling with massive clot burden.  After discussion with vascular surgery-she was rechallenged with anticoagulation which she has tolerated without any signs of rebleeding.  Currently remains medically stable-and awaiting SNF/guardianship process.   Significant studies: 6/2>> CTA chest: PE with CT evidence of RV strain. 6/2>> CT abdomen/pelvis: Cholelithiasis, CT findings of left pyelonephritis, esophageal/patulous/thickened distally 6/2>> CT head: Large hypodense/irregular mass in the right basal ganglia/right insula-with surrounding edema. 6/3>> RUQ ultrasound: Cholelithiasis/mild pericholecystic fluid/mild gallbladder wall thickening. 6/3>> MRI brain no contrast: 5.2 x 5.3 cm mass on the right-with mass effect and vasogenic edema 6/3>> Echo: EF 78-67%, RV systolic function normal 6/7>> CT angio GI bleed: No areas of active extravasation-large left-sided retroperitoneal hematoma. 6/14>> MRI brain with/without contrast: 5.9 x 5.4 x 5.9 enhancing mass in  the right frontotemporal/periinsular region-favored extra-axial in origin and favored to reflect a meningioma. 6/23>> CT head: Large meningioma involving the right frontotemporal region with associated vasogenic edema/regional mass effect 7/5>> acute DVT involving left common iliac, external iliac, common femoral, left femoral, left proximal profunda, left popliteal, left posterior tibial, left peroneal, left gastrocnemius  Procedures: 6/5>> IVC filter by IR   Consults: PCCM, neurosurgery, GI, IR, palliative care, vascular surgery  New events last 24 hours / Subjective: No new events or complaints today.  Blood count stable  Assessment & Plan:   Principal Problem:   Acute pulmonary embolism with acute cor pulmonale (HCC) Active Problems:   ABLA likely due to retroperitoneal bleed   Acute metabolic encephalopathy   Brain mass with vasogenic edema and mass effect   Esophageal dysphagia   Hypovolemic shock (HCC)   Acute respiratory failure with hypoxia (HCC)   AKI (acute kidney injury) (HCC)   Controlled NIDDM-2   Physical deconditioning   Pressure injury of skin   Submassive PE with acute cor pulmonale: Did not tolerate IV heparin on initial presentation and developed a retroperitoneal bleed. IVC filter placed.  Unfortunately over the past several days has developed a significant DVT in LLE.  Now stable on eliquis.    LLE DVT: Significant clot burden in LLE on Doppler ultrasound on 7/5. Difficult situation, has IVC filter in place, previous MD discussed with vascular surgery Dr. Stanford Breed on 7/6. Deemed not a candidate for thrombectomy, recommendations were to rechallenge with IV heparin which she tolerated well for 72 hours. Now transitioned to Eliquis on 7/9.  Check CBC periodically    Acute blood loss anemia due to retroperitoneal bleed: Required several units of PRBC transfusion.  Now stable.    Acute metabolic encephalopathy: Multifactorial due to acute illness/brain mass,  suspicion for some underlying cognitive issue (per prior note-estranged from family-does strange things-fails  to recognize her brother at times). Apparently has some impaired hearing which could contribute. Vitamin B12/TSH/RPR all stable.    Large right basal ganglia mass with surrounding edema/11 mm midline shift: Continues to have left hemiparesis. Continue steroids. Per prior notes, now thought to be a meningioma. Neurosurgery recommending outpatient follow-up in 2-3 months for reevaluation/craniotomy.  Previously MD spoke with neurosurgeon Dr. Reatha Armour on 7/6. No contraindication for anticoagulation. Suggest that we slowly taper off Decadron in 2 weeks.   Multifactorial shock-due to hypovolemia/submassive PE/septic shock (pyelonephritis): Completed antibiotics. BP stable.    Acute hypoxic respiratory failure: Resolved.   Distal esophageal thickening: Noted on CT. Evaluated by GI, recommendations are for PPI. EGD deferred for now, could be possibly done in the outpatient setting.   DM-2 (A1c 5.5 on 6/2): CBG stable   Deconditioning: Evaluated by therapy. Recommendations are for SNF.   Other issues: APS involved-social work following-guardianship process underway-as patient does not have capacity.  Discussed with social work Arbie Cookey on 7/6-no family member wants to make any decisions   DVT prophylaxis:  Place and maintain sequential compression device Start: 02/02/22 1450 apixaban (ELIQUIS) tablet 5 mg  Code Status: Full Family Communication: None  Disposition Plan:  Status is: Inpatient Remains inpatient appropriate because: guardianship and placement pending.    Antimicrobials:  Anti-infectives (From admission, onward)    Start     Dose/Rate Route Frequency Ordered Stop   02/03/22 1715  levofloxacin (LEVAQUIN) tablet 500 mg  Status:  Discontinued        500 mg Oral Daily 02/03/22 1627 02/05/22 1355   02/02/22 2045  levofloxacin (LEVAQUIN) IVPB 500 mg  Status:  Discontinued        Note to Pharmacy: Adjust as needed - switching to IV since unable to take PO   500 mg 100 mL/hr over 60 Minutes Intravenous Every 24 hours 02/02/22 1959 02/03/22 1627   02/01/22 1400  levofloxacin (LEVAQUIN) tablet 500 mg  Status:  Discontinued        500 mg Oral Daily 02/01/22 0911 02/02/22 1959   01/31/22 1000  vancomycin (VANCOREADY) IVPB 750 mg/150 mL  Status:  Discontinued        750 mg 150 mL/hr over 60 Minutes Intravenous Every 12 hours 01/31/22 0003 01/31/22 0848   01/31/22 0200  vancomycin (VANCOREADY) IVPB 1500 mg/300 mL  Status:  Discontinued        1,500 mg 150 mL/hr over 120 Minutes Intravenous Every 12 hours 01/30/22 1241 01/30/22 2348   01/30/22 2100  aztreonam (AZACTAM) 2 g in sodium chloride 0.9 % 100 mL IVPB  Status:  Discontinued        2 g 200 mL/hr over 30 Minutes Intravenous Every 8 hours 01/30/22 1240 02/01/22 0923   01/30/22 1245  aztreonam (AZACTAM) 2 g in sodium chloride 0.9 % 100 mL IVPB        2 g 200 mL/hr over 30 Minutes Intravenous  Once 01/30/22 1231 01/30/22 1408   01/30/22 1245  metroNIDAZOLE (FLAGYL) IVPB 500 mg        500 mg 100 mL/hr over 60 Minutes Intravenous  Once 01/30/22 1231 01/30/22 1408   01/30/22 1245  vancomycin (VANCOCIN) IVPB 1000 mg/200 mL premix  Status:  Discontinued        1,000 mg 200 mL/hr over 60 Minutes Intravenous  Once 01/30/22 1231 01/30/22 1239   01/30/22 1245  vancomycin (VANCOREADY) IVPB 2000 mg/400 mL        2,000 mg 200 mL/hr over 120 Minutes  Intravenous  Once 01/30/22 1239 01/30/22 1607        Objective: Vitals:   03/15/22 2118 03/16/22 0508 03/16/22 0509 03/16/22 0859  BP: (!) 110/96  119/70 99/62  Pulse: 88  60 66  Resp: '18  18 17  '$ Temp: 98.4 F (36.9 C) 98.1 F (36.7 C)  97.7 F (36.5 C)  TempSrc: Oral Oral  Oral  SpO2: 90%  93% 95%  Weight:      Height:        Intake/Output Summary (Last 24 hours) at 03/16/2022 1228 Last data filed at 03/16/2022 0900 Gross per 24 hour  Intake 240 ml  Output 1400  ml  Net -1160 ml    Filed Weights   03/09/22 0533 03/12/22 0733 03/13/22 0510  Weight: 68.9 kg 68.9 kg 70 kg    Examination:  General exam: Appears calm and comfortable   Data Reviewed: I have personally reviewed following labs and imaging studies  CBC: Recent Labs  Lab 03/16/22 0623  WBC 10.0  HGB 13.0  HCT 39.5  MCV 89.4  PLT 938    Basic Metabolic Panel: Recent Labs  Lab 03/16/22 0623  NA 136  K 3.7  CL 103  CO2 27  GLUCOSE 106*  BUN 21  CREATININE 0.64  CALCIUM 8.7*   GFR: Estimated Creatinine Clearance: 68.8 mL/min (by C-G formula based on SCr of 0.64 mg/dL). Liver Function Tests: No results for input(s): "AST", "ALT", "ALKPHOS", "BILITOT", "PROT", "ALBUMIN" in the last 168 hours. No results for input(s): "LIPASE", "AMYLASE" in the last 168 hours. No results for input(s): "AMMONIA" in the last 168 hours. Coagulation Profile: No results for input(s): "INR", "PROTIME" in the last 168 hours. Cardiac Enzymes: No results for input(s): "CKTOTAL", "CKMB", "CKMBINDEX", "TROPONINI" in the last 168 hours. BNP (last 3 results) No results for input(s): "PROBNP" in the last 8760 hours. HbA1C: No results for input(s): "HGBA1C" in the last 72 hours. CBG: Recent Labs  Lab 03/14/22 2059  GLUCAP 165*    Lipid Profile: No results for input(s): "CHOL", "HDL", "LDLCALC", "TRIG", "CHOLHDL", "LDLDIRECT" in the last 72 hours. Thyroid Function Tests: No results for input(s): "TSH", "T4TOTAL", "FREET4", "T3FREE", "THYROIDAB" in the last 72 hours. Anemia Panel: No results for input(s): "VITAMINB12", "FOLATE", "FERRITIN", "TIBC", "IRON", "RETICCTPCT" in the last 72 hours. Sepsis Labs: No results for input(s): "PROCALCITON", "LATICACIDVEN" in the last 168 hours.  Recent Results (from the past 240 hour(s))  MRSA Next Gen by PCR, Nasal     Status: None   Collection Time: 03/12/22  7:03 AM   Specimen: Nasal Mucosa; Nasal Swab  Result Value Ref Range Status   MRSA by PCR  Next Gen NOT DETECTED NOT DETECTED Final    Comment: (NOTE) The GeneXpert MRSA Assay (FDA approved for NASAL specimens only), is one component of a comprehensive MRSA colonization surveillance program. It is not intended to diagnose MRSA infection nor to guide or monitor treatment for MRSA infections. Test performance is not FDA approved in patients less than 45 years old. Performed at [facility], [place] [address] [place], [place] [address]       Radiology Studies: No results found.    Scheduled Meds:  apixaban  5 mg Oral BID   dexamethasone  3 mg Oral Daily   docusate sodium  100 mg Oral BID   feeding supplement  1 Container Oral TID BM   gabapentin  300 mg Oral BID   pantoprazole  40 mg Oral BID   polyethylene  glycol  17 g Oral BID   senna  1 tablet Oral QHS   Continuous Infusions:  sodium chloride Stopped (02/02/22 0929)     LOS: 45 days     Dessa Phi, DO [facility] 03/16/2022, 12:28 PM   Available via Epic secure chat 7am-7pm After these hours, please refer to coverage provider listed on [email or web address]

## 2022-03-17 DIAGNOSIS — I2609 Other pulmonary embolism with acute cor pulmonale: Secondary | ICD-10-CM | POA: Diagnosis not present

## 2022-03-17 MED ORDER — ORAL CARE MOUTH RINSE
15.0000 mL | OROMUCOSAL | Status: DC
  Administered 2022-03-17 – 2022-04-01 (×39): 15 mL via OROMUCOSAL

## 2022-03-17 MED ORDER — ORAL CARE MOUTH RINSE: 15.0000 mL | OROMUCOSAL | Status: DC | PRN

## 2022-03-17 NOTE — Plan of Care (Signed)
  Problem: Skin Integrity: Goal: Risk for impaired skin integrity will decrease Outcome: Progressing   

## 2022-03-17 NOTE — Progress Notes (Signed)
Occupational Therapy Treatment Patient Details Name: Jennifer Pacheco MRN: [id number] DOB: 04-26-1956 Today's Date: 03/17/2022   History of present illness Pt is 66 yo female who was found down in her home and taken to [facility]. 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 Pt with retroperitoneal bleed and IVC filter placed 6/5. 7/5>> acute DVT involving left common iliac, external iliac, common femoral, left femoral, left proximal profunda, left popliteal, left posterior tibial, left peroneal, left gastrocnemius PMH: DM2, HTN.   OT comments  Pt seen for OT session, not progressing towards goals. Pt with decreased attention and command following limiting participation. Pt total A for LB ADL, max A for rolling, total A to sit EOB, with heavy posterior lean, does not follow commands to hold self up using bedrail, further mobility deferred. Pt following <25% of commands this session, although has increased attention when friend is present in room/interactive during session. Pt able to complete LUE there ex at bed level with mod cuing. Pt presenting with impairments listed below, will follow acutely. Continue to recommend SNF at d/c.   Recommendations for follow up therapy are one component of a multi-disciplinary discharge planning process, led by the attending physician.  Recommendations may be updated based on patient status, additional functional criteria and insurance authorization.    Follow Up Recommendations  Skilled nursing-short term rehab (<3 hours/day)    Assistance Recommended at Discharge Frequent or constant Supervision/Assistance  Patient can return home with the following  Two people to help with walking and/or transfers;A lot of help with bathing/dressing/bathroom;Assistance with cooking/housework;Direct supervision/assist for medications management;Direct supervision/assist for financial management;Assist for transportation;Help with stairs or  ramp for entrance   Equipment Recommendations  None recommended by OT;Other (comment) (defer to next venue of care)    Recommendations for Other Services PT consult    Precautions / Restrictions Precautions Precautions: Fall Precaution Comments: HOH Restrictions Weight Bearing Restrictions: No       Mobility Bed Mobility Overal bed mobility: Needs Assistance Bed Mobility: Rolling, Supine to Sit, Sit to Supine Rolling: Max assist   Supine to sit: Total assist Sit to supine: Total assist   General bed mobility comments: pt with posterior lean with attempt to sit EOB, max cues for pt to hold self up with bedrail/hand on mattress, pt states "why, you're helping hold me up"    Transfers                   General transfer comment: deferred     Balance Overall balance assessment: Needs assistance Sitting-balance support: Feet supported, Single extremity supported Sitting balance-Leahy Scale: Zero Sitting balance - Comments: needs outside support to maintain upright due to posterior lean                                   ADL either performed or assessed with clinical judgement   ADL Overall ADL's : Needs assistance/impaired                     Lower Body Dressing: Total assistance;Bed level                      Extremity/Trunk Assessment Upper Extremity Assessment Upper Extremity Assessment: Generalized weakness (L>R)   Lower Extremity Assessment Lower Extremity Assessment: Defer to PT evaluation        Vision   Vision  Assessment?: Vision impaired- to be further tested in functional context   Perception Perception Perception: Not tested   Praxis Praxis Praxis: Not tested    Cognition Arousal/Alertness: Awake/alert Behavior During Therapy: Flat affect Overall Cognitive Status: Impaired/Different from baseline Area of Impairment: Orientation, Attention, Memory, Following commands, Safety/judgement, Awareness, Problem  solving                   Current Attention Level: Focused, Sustained Memory: Decreased short-term memory Following Commands: Follows one step commands with increased time, Follows one step commands inconsistently Safety/Judgement: Decreased awareness of safety, Decreased awareness of deficits Awareness:  (pre-intellectual) Problem Solving: Difficulty sequencing, Requires verbal cues, Requires tactile cues, Slow processing, Decreased initiation General Comments: pt found incontinent of stool,requires physical assist to keep from putting hands in stool,  would not follow any commands during session        Exercises Exercises: Other exercises Other Exercises Other Exercises: L shoulder flexion x10 Other Exercises: L hand squeezes x10    Shoulder Instructions       General Comments better command following when friend "ken" is in room    Pertinent Vitals/ Pain       Pain Assessment Pain Assessment: Faces Pain Score: 4  Faces Pain Scale: Hurts little more Pain Location: generalized with movement Pain Descriptors / Indicators: Grimacing, Guarding Pain Intervention(s): Limited activity within patient's tolerance, Repositioned, Monitored during session  Home Living                                          Prior Functioning/Environment              Frequency  Min 2X/week        Progress Toward Goals  OT Goals(current goals can now be found in the care plan section)  Progress towards OT goals: Not progressing toward goals - comment (difficulty with command following/attention decreasing ability to participate)  Acute Rehab OT Goals Patient Stated Goal: to get out of here OT Goal Formulation: With patient Time For Goal Achievement: 03/31/22 Potential to Achieve Goals: Fair ADL Goals Pt/caregiver will Perform Home Exercise Program: Increased ROM;Increased strength;Both right and left upper extremity;With Supervision Additional ADL Goal #1:  Pt will initate 1/4 ADL tasks when presented during session in prep for independence with ADLs/mobility Additional ADL Goal #2: Pt will tolerate OOB routine to chair in prep for ADLs/mobility Additional ADL Goal #3: Pt will follow 50% of commands given in prep for ADLs  Plan Discharge plan remains appropriate    Co-evaluation                 AM-PAC OT "6 Clicks" Daily Activity     Outcome Measure   Help from another person eating meals?: A Little Help from another person taking care of personal grooming?: A Lot Help from another person toileting, which includes using toliet, bedpan, or urinal?: Total Help from another person bathing (including washing, rinsing, drying)?: A Lot Help from another person to put on and taking off regular upper body clothing?: A Lot Help from another person to put on and taking off regular lower body clothing?: Total 6 Click Score: 11    End of Session    OT Visit Diagnosis: Unsteadiness on feet (R26.81);Other abnormalities of gait and mobility (R26.89);History of falling (Z91.81);Other symptoms and signs involving cognitive function   Activity Tolerance Patient tolerated treatment well  Patient Left in bed;with call bell/phone within reach;with bed alarm set;with family/visitor present   Nurse Communication Mobility status        Time: 8295-6213 OT Time Calculation (min): 20 min  Charges: OT General Charges $OT Visit: 1 Visit OT Treatments $Therapeutic Activity: 8-22 mins  Lynnda Child, OTD, OTR/L Acute Rehab [phone] - [place] 03/17/2022, 4:08 PM

## 2022-03-17 NOTE — Progress Notes (Signed)
PROGRESS NOTE    Jennifer Pacheco  XHB:[id number] DOB: 1956-01-21 DOA: 01/30/2022 PCP: [name], NP     Brief Narrative:  Patient is a 66 y.o.  female with history of DM-2, HTN who presented to [facility] ED on 6/2 after she was found confused/down in feces/urine-she was hypotensive/hypoxic-she was found to have a submassive PE-CT head showed right basal ganglia mass with mass effect-she was transferred to [place] she was monitored and started on anticoagulation.  Unfortunately further hospital course was complicated by development of a retroperitoneal bleed and acute blood loss anemia.  Heparin was discontinued-IVC filter was placed-and upon further stability-patient was transferred to [facility].  Upon further neuroimaging studies-brain mass was consistent with a meningioma.  Further hospital course was complicated by persistent cognitive dysfunction (likely chronic-social worker working on guardianship) and development of significant LLE swelling with massive clot burden.  After discussion with vascular surgery-she was rechallenged with anticoagulation which she has tolerated without any signs of rebleeding.  Currently remains medically stable-and awaiting SNF/guardianship process.   Significant studies: 6/2>> CTA chest: PE with CT evidence of RV strain. 6/2>> CT abdomen/pelvis: Cholelithiasis, CT findings of left pyelonephritis, esophageal/patulous/thickened distally 6/2>> CT head: Large hypodense/irregular mass in the right basal ganglia/right insula-with surrounding edema. 6/3>> RUQ ultrasound: Cholelithiasis/mild pericholecystic fluid/mild gallbladder wall thickening. 6/3>> MRI brain no contrast: 5.2 x 5.3 cm mass on the right-with mass effect and vasogenic edema 6/3>> Echo: EF 81-01%, RV systolic function normal 7/5>> CT angio GI bleed: No areas of active extravasation-large left-sided retroperitoneal hematoma. 6/14>> MRI brain with/without contrast: 5.9 x 5.4 x 5.9 enhancing mass in  the right frontotemporal/periinsular region-favored extra-axial in origin and favored to reflect a meningioma. 6/23>> CT head: Large meningioma involving the right frontotemporal region with associated vasogenic edema/regional mass effect 7/5>> acute DVT involving left common iliac, external iliac, common femoral, left femoral, left proximal profunda, left popliteal, left posterior tibial, left peroneal, left gastrocnemius  Procedures: 6/5>> IVC filter by IR   Consults: PCCM, neurosurgery, GI, IR, palliative care, vascular surgery  New events last 24 hours / Subjective: No change   Assessment & Plan:   Principal Problem:   Acute pulmonary embolism with acute cor pulmonale (HCC) Active Problems:   ABLA likely due to retroperitoneal bleed   Acute metabolic encephalopathy   Brain mass with vasogenic edema and mass effect   Esophageal dysphagia   Hypovolemic shock (HCC)   Acute respiratory failure with hypoxia (HCC)   AKI (acute kidney injury) (HCC)   Controlled NIDDM-2   Physical deconditioning   Pressure injury of skin   Submassive PE with acute cor pulmonale: Did not tolerate IV heparin on initial presentation and developed a retroperitoneal bleed. IVC filter placed.  Unfortunately over the past several days has developed a significant DVT in LLE.  Now stable on eliquis.    LLE DVT: Significant clot burden in LLE on Doppler ultrasound on 7/5. Difficult situation, has IVC filter in place, previous MD discussed with vascular surgery Dr. Stanford Breed on 7/6. Deemed not a candidate for thrombectomy, recommendations were to rechallenge with IV heparin which she tolerated well for 72 hours. Now transitioned to Eliquis on 7/9.  Check CBC periodically    Acute blood loss anemia due to retroperitoneal bleed: Required several units of PRBC transfusion.  Now stable.    Acute metabolic encephalopathy: Multifactorial due to acute illness/brain mass, suspicion for some underlying cognitive issue (per  prior note-estranged from family-does strange things-fails to recognize her brother at times). Apparently  has some impaired hearing which could contribute. Vitamin B12/TSH/RPR all stable.    Large right basal ganglia mass with surrounding edema/11 mm midline shift: Continues to have left hemiparesis. Continue steroids. Per prior notes, now thought to be a meningioma. Neurosurgery recommending outpatient follow-up in 2-3 months for reevaluation/craniotomy.  Previously MD spoke with neurosurgeon Dr. Reatha Armour on 7/6. No contraindication for anticoagulation. Suggest that we slowly taper off Decadron in 2 weeks.   Multifactorial shock-due to hypovolemia/submassive PE/septic shock (pyelonephritis): Completed antibiotics. BP stable.    Acute hypoxic respiratory failure: Resolved.   Distal esophageal thickening: Noted on CT. Evaluated by GI, recommendations are for PPI. EGD deferred for now, could be possibly done in the outpatient setting.   DM-2 (A1c 5.5 on 6/2): CBG stable   Deconditioning: Evaluated by therapy. Recommendations are for SNF.   Other issues: APS involved-social work following-guardianship process underway-as patient does not have capacity.  Discussed with social work Arbie Cookey on 7/6-no family member wants to make any decisions   DVT prophylaxis:  Place and maintain sequential compression device Start: 02/02/22 1450 apixaban (ELIQUIS) tablet 5 mg  Code Status: Full Family Communication: None  Disposition Plan:  Status is: Inpatient Remains inpatient appropriate because: guardianship and placement pending.    Antimicrobials:  Anti-infectives (From admission, onward)    Start     Dose/Rate Route Frequency Ordered Stop   02/03/22 1715  levofloxacin (LEVAQUIN) tablet 500 mg  Status:  Discontinued        500 mg Oral Daily 02/03/22 1627 02/05/22 1355   02/02/22 2045  levofloxacin (LEVAQUIN) IVPB 500 mg  Status:  Discontinued       Note to Pharmacy: Adjust as needed - switching to IV  since unable to take PO   500 mg 100 mL/hr over 60 Minutes Intravenous Every 24 hours 02/02/22 1959 02/03/22 1627   02/01/22 1400  levofloxacin (LEVAQUIN) tablet 500 mg  Status:  Discontinued        500 mg Oral Daily 02/01/22 0911 02/02/22 1959   01/31/22 1000  vancomycin (VANCOREADY) IVPB 750 mg/150 mL  Status:  Discontinued        750 mg 150 mL/hr over 60 Minutes Intravenous Every 12 hours 01/31/22 0003 01/31/22 0848   01/31/22 0200  vancomycin (VANCOREADY) IVPB 1500 mg/300 mL  Status:  Discontinued        1,500 mg 150 mL/hr over 120 Minutes Intravenous Every 12 hours 01/30/22 1241 01/30/22 2348   01/30/22 2100  aztreonam (AZACTAM) 2 g in sodium chloride 0.9 % 100 mL IVPB  Status:  Discontinued        2 g 200 mL/hr over 30 Minutes Intravenous Every 8 hours 01/30/22 1240 02/01/22 0923   01/30/22 1245  aztreonam (AZACTAM) 2 g in sodium chloride 0.9 % 100 mL IVPB        2 g 200 mL/hr over 30 Minutes Intravenous  Once 01/30/22 1231 01/30/22 1408   01/30/22 1245  metroNIDAZOLE (FLAGYL) IVPB 500 mg        500 mg 100 mL/hr over 60 Minutes Intravenous  Once 01/30/22 1231 01/30/22 1408   01/30/22 1245  vancomycin (VANCOCIN) IVPB 1000 mg/200 mL premix  Status:  Discontinued        1,000 mg 200 mL/hr over 60 Minutes Intravenous  Once 01/30/22 1231 01/30/22 1239   01/30/22 1245  vancomycin (VANCOREADY) IVPB 2000 mg/400 mL        2,000 mg 200 mL/hr over 120 Minutes Intravenous  Once 01/30/22 1239 01/30/22 1607  Objective: Vitals:   03/16/22 1740 03/16/22 2100 03/17/22 0541 03/17/22 0915  BP: 101/72 108/73 113/73 100/74  Pulse: 65 (!) 57 (!) 58 69  Resp: '16 16 18 16  '$ Temp: 98.3 F (36.8 C) 98 F (36.7 C) 98.4 F (36.9 C) 97.9 F (36.6 C)  TempSrc:   Oral Oral  SpO2: 96% 95% 98% 100%  Weight:      Height:        Intake/Output Summary (Last 24 hours) at 03/17/2022 1057 Last data filed at 03/17/2022 0737 Gross per 24 hour  Intake 1097 ml  Output 800 ml  Net 297 ml     Filed Weights   03/09/22 0533 03/12/22 0733 03/13/22 0510  Weight: 68.9 kg 68.9 kg 70 kg    Examination:  General exam: Appears calm and comfortable   Data Reviewed: I have personally reviewed following labs and imaging studies  CBC: Recent Labs  Lab 03/16/22 0623  WBC 10.0  HGB 13.0  HCT 39.5  MCV 89.4  PLT 812    Basic Metabolic Panel: Recent Labs  Lab 03/16/22 0623  NA 136  K 3.7  CL 103  CO2 27  GLUCOSE 106*  BUN 21  CREATININE 0.64  CALCIUM 8.7*    GFR: Estimated Creatinine Clearance: 68.8 mL/min (by C-G formula based on SCr of 0.64 mg/dL). Liver Function Tests: No results for input(s): "AST", "ALT", "ALKPHOS", "BILITOT", "PROT", "ALBUMIN" in the last 168 hours. No results for input(s): "LIPASE", "AMYLASE" in the last 168 hours. No results for input(s): "AMMONIA" in the last 168 hours. Coagulation Profile: No results for input(s): "INR", "PROTIME" in the last 168 hours. Cardiac Enzymes: No results for input(s): "CKTOTAL", "CKMB", "CKMBINDEX", "TROPONINI" in the last 168 hours. BNP (last 3 results) No results for input(s): "PROBNP" in the last 8760 hours. HbA1C: No results for input(s): "HGBA1C" in the last 72 hours. CBG: Recent Labs  Lab 03/14/22 2059  GLUCAP 165*    Lipid Profile: No results for input(s): "CHOL", "HDL", "LDLCALC", "TRIG", "CHOLHDL", "LDLDIRECT" in the last 72 hours. Thyroid Function Tests: No results for input(s): "TSH", "T4TOTAL", "FREET4", "T3FREE", "THYROIDAB" in the last 72 hours. Anemia Panel: No results for input(s): "VITAMINB12", "FOLATE", "FERRITIN", "TIBC", "IRON", "RETICCTPCT" in the last 72 hours. Sepsis Labs: No results for input(s): "PROCALCITON", "LATICACIDVEN" in the last 168 hours.  Recent Results (from the past 240 hour(s))  MRSA Next Gen by PCR, Nasal     Status: None   Collection Time: 03/12/22  7:03 AM   Specimen: Nasal Mucosa; Nasal Swab  Result Value Ref Range Status   MRSA by PCR Next Gen NOT  DETECTED NOT DETECTED Final    Comment: (NOTE) The GeneXpert MRSA Assay (FDA approved for NASAL specimens only), is one component of a comprehensive MRSA colonization surveillance program. It is not intended to diagnose MRSA infection nor to guide or monitor treatment for MRSA infections. Test performance is not FDA approved in patients less than 30 years old. Performed at [facility], [place] [address], [place], [place] [address]       Radiology Studies: No results found.    Scheduled Meds:  apixaban  5 mg Oral BID   dexamethasone  3 mg Oral Daily   docusate sodium  100 mg Oral BID   feeding supplement  1 Container Oral TID BM   gabapentin  300 mg Oral BID   mouth rinse  15 mL Mouth Rinse 4 times per day   pantoprazole  40 mg Oral  BID   polyethylene glycol  17 g Oral BID   senna  1 tablet Oral QHS   Continuous Infusions:  sodium chloride Stopped (02/02/22 0929)     LOS: 46 days     Dessa Phi, DO [facility] 03/17/2022, 10:57 AM   Available via Epic secure chat 7am-7pm After these hours, please refer to coverage provider listed on [email or web address]

## 2022-03-18 DIAGNOSIS — I2609 Other pulmonary embolism with acute cor pulmonale: Secondary | ICD-10-CM | POA: Diagnosis not present

## 2022-03-18 NOTE — Plan of Care (Signed)
  Problem: Skin Integrity: Goal: Risk for impaired skin integrity will decrease Outcome: Completed/Met

## 2022-03-18 NOTE — Progress Notes (Signed)
PROGRESS NOTE    Jennifer Pacheco  BTD:[id number] DOB: 01-01-1956 DOA: 01/30/2022 PCP: [name], NP     Brief Narrative:  Patient is a 66 y.o.  female with history of DM-2, HTN who presented to [facility] ED on 6/2 after she was found confused/down in feces/urine-she was hypotensive/hypoxic-she was found to have a submassive PE-CT head showed right basal ganglia mass with mass effect-she was transferred to [place] she was monitored and started on anticoagulation.  Unfortunately further hospital course was complicated by development of a retroperitoneal bleed and acute blood loss anemia.  Heparin was discontinued-IVC filter was placed-and upon further stability-patient was transferred to [facility].  Upon further neuroimaging studies-brain mass was consistent with a meningioma.  Further hospital course was complicated by persistent cognitive dysfunction (likely chronic-social worker working on guardianship) and development of significant LLE swelling with massive clot burden.  After discussion with vascular surgery-she was rechallenged with anticoagulation which she has tolerated without any signs of rebleeding.  Currently remains medically stable-and awaiting SNF/guardianship process.   Significant studies: 6/2>> CTA chest: PE with CT evidence of RV strain. 6/2>> CT abdomen/pelvis: Cholelithiasis, CT findings of left pyelonephritis, esophageal/patulous/thickened distally 6/2>> CT head: Large hypodense/irregular mass in the right basal ganglia/right insula-with surrounding edema. 6/3>> RUQ ultrasound: Cholelithiasis/mild pericholecystic fluid/mild gallbladder wall thickening. 6/3>> MRI brain no contrast: 5.2 x 5.3 cm mass on the right-with mass effect and vasogenic edema 6/3>> Echo: EF 36-46%, RV systolic function normal 8/0>> CT angio GI bleed: No areas of active extravasation-large left-sided retroperitoneal hematoma. 6/14>> MRI brain with/without contrast: 5.9 x 5.4 x 5.9 enhancing mass in  the right frontotemporal/periinsular region-favored extra-axial in origin and favored to reflect a meningioma. 6/23>> CT head: Large meningioma involving the right frontotemporal region with associated vasogenic edema/regional mass effect 7/5>> acute DVT involving left common iliac, external iliac, common femoral, left femoral, left proximal profunda, left popliteal, left posterior tibial, left peroneal, left gastrocnemius  Procedures: 6/5>> IVC filter by IR   Consults: PCCM, neurosurgery, GI, IR, palliative care, vascular surgery  New events last 24 hours / Subjective:  Quite confused-laughs inappropriately at times-trying to open her ice cream But only using her right hand-she is able to move her other extremities but seems to have a little bit of neglect on the left side  Assessment & Plan:   Principal Problem:   Acute pulmonary embolism with acute cor pulmonale (HCC) Active Problems:   ABLA likely due to retroperitoneal bleed   Acute metabolic encephalopathy   Brain mass with vasogenic edema and mass effect   Esophageal dysphagia   Hypovolemic shock (HCC)   Acute respiratory failure with hypoxia (HCC)   AKI (acute kidney injury) ([place])   Controlled NIDDM-2   Physical deconditioning   Pressure injury of skin   Submassive PE with acute cor pulmonale: Did not tolerate IV heparin on initial presentation and developed a retroperitoneal bleed. IVC filter placed.  7/5 developed a significant DVT in LLE.  Now stable on eliquis.-Periodic lab draws   LLE DVT: Significant clot burden in LLE on Doppler ultrasound on 7/5. Difficult situation, has IVC filter in place, previous MD discussed with vascular surgery Dr. Stanford Breed on 7/6. Deemed not a candidate for thrombectomy, recommendations were to rechallenge with IV heparin which she tolerated well for 72 hours. Now transitioned to Eliquis on 7/9.  Check CBC periodically    Acute blood loss anemia due to retroperitoneal bleed: Required several  units of PRBC transfusion.  Now stable.  Acute metabolic encephalopathy: Multifactorial due to acute illness/brain mass, suspicion for some underlying cognitive issue (per prior note-estranged from family-does strange things-fails to recognize her brother at times). Apparently has some impaired hearing which could contribute. Vitamin B12/TSH/RPR all stable.    Large right basal ganglia mass with surrounding edema/11 mm midline shift: Continues to have left hemiparesis. Continue steroids. Per prior notes, now thought to be a meningioma. Neurosurgery recommending outpatient follow-up in 2-3 months for reevaluation/craniotomy.  Previously MD spoke with neurosurgeon Dr. Reatha Armour on 7/6. No contraindication for anticoagulation. Suggest that we slowly taper off Decadron in 2 weeks.   Multifactorial shock-due to hypovolemia/submassive PE/septic shock (pyelonephritis): Completed antibiotics. BP stable.    Acute hypoxic respiratory failure: Resolved.   Distal esophageal thickening: Noted on CT. Evaluated by GI, recommendations are for PPI. EGD deferred for now, could be possibly done in the outpatient setting.   DM-2 (A1c 5.5 on 6/2): CBG stable   Deconditioning: Evaluated by therapy. Recommendations are for SNF.   Other issues: APS involved-social work following-guardianship process underway-as patient does not have capacity.  Discussed with social work Arbie Cookey on 7/6-no family member wants to make any decisions   DVT prophylaxis:  Place and maintain sequential compression device Start: 02/02/22 1450 apixaban (ELIQUIS) tablet 5 mg  Code Status: Full Family Communication: None  Disposition Plan:  Status is: Inpatient Remains inpatient appropriate because: guardianship and placement pending.    Antimicrobials:  Anti-infectives (From admission, onward)    Start     Dose/Rate Route Frequency Ordered Stop   02/03/22 1715  levofloxacin (LEVAQUIN) tablet 500 mg  Status:  Discontinued        500 mg  Oral Daily 02/03/22 1627 02/05/22 1355   02/02/22 2045  levofloxacin (LEVAQUIN) IVPB 500 mg  Status:  Discontinued       Note to Pharmacy: Adjust as needed - switching to IV since unable to take PO   500 mg 100 mL/hr over 60 Minutes Intravenous Every 24 hours 02/02/22 1959 02/03/22 1627   02/01/22 1400  levofloxacin (LEVAQUIN) tablet 500 mg  Status:  Discontinued        500 mg Oral Daily 02/01/22 0911 02/02/22 1959   01/31/22 1000  vancomycin (VANCOREADY) IVPB 750 mg/150 mL  Status:  Discontinued        750 mg 150 mL/hr over 60 Minutes Intravenous Every 12 hours 01/31/22 0003 01/31/22 0848   01/31/22 0200  vancomycin (VANCOREADY) IVPB 1500 mg/300 mL  Status:  Discontinued        1,500 mg 150 mL/hr over 120 Minutes Intravenous Every 12 hours 01/30/22 1241 01/30/22 2348   01/30/22 2100  aztreonam (AZACTAM) 2 g in sodium chloride 0.9 % 100 mL IVPB  Status:  Discontinued        2 g 200 mL/hr over 30 Minutes Intravenous Every 8 hours 01/30/22 1240 02/01/22 0923   01/30/22 1245  aztreonam (AZACTAM) 2 g in sodium chloride 0.9 % 100 mL IVPB        2 g 200 mL/hr over 30 Minutes Intravenous  Once 01/30/22 1231 01/30/22 1408   01/30/22 1245  metroNIDAZOLE (FLAGYL) IVPB 500 mg        500 mg 100 mL/hr over 60 Minutes Intravenous  Once 01/30/22 1231 01/30/22 1408   01/30/22 1245  vancomycin (VANCOCIN) IVPB 1000 mg/200 mL premix  Status:  Discontinued        1,000 mg 200 mL/hr over 60 Minutes Intravenous  Once 01/30/22 1231 01/30/22 1239   01/30/22  1245  vancomycin (VANCOREADY) IVPB 2000 mg/400 mL        2,000 mg 200 mL/hr over 120 Minutes Intravenous  Once 01/30/22 1239 01/30/22 1607        Objective: Vitals:   03/17/22 2111 03/18/22 0503 03/18/22 0922 03/18/22 0922  BP: 116/66 132/79  126/82  Pulse: 71 61  81  Resp: '17 18  17  '$ Temp: 98.2 F (36.8 C) 98.2 F (36.8 C) 98.3 F (36.8 C)   TempSrc:   Axillary   SpO2: 95% 96%  98%  Weight:      Height:        Intake/Output Summary (Last  24 hours) at 03/18/2022 1231 Last data filed at 03/18/2022 0923 Gross per 24 hour  Intake 680 ml  Output 50 ml  Net 630 ml    Filed Weights   03/09/22 0533 03/12/22 0733 03/13/22 0510  Weight: 68.9 kg 68.9 kg 70 kg    Examination:   Awake coherent disheveled food all over the front of her dress No twisting of mouth Chest clear Abdomen soft Left leg swollen >right side  Data Reviewed: I have personally reviewed following labs and imaging studies  CBC: Recent Labs  Lab 03/16/22 0623  WBC 10.0  HGB 13.0  HCT 39.5  MCV 89.4  PLT 235    Basic Metabolic Panel: Recent Labs  Lab 03/16/22 0623  NA 136  K 3.7  CL 103  CO2 27  GLUCOSE 106*  BUN 21  CREATININE 0.64  CALCIUM 8.7*    GFR: Estimated Creatinine Clearance: 68.8 mL/min (by C-G formula based on SCr of 0.64 mg/dL). Liver Function Tests: No results for input(s): "AST", "ALT", "ALKPHOS", "BILITOT", "PROT", "ALBUMIN" in the last 168 hours. No results for input(s): "LIPASE", "AMYLASE" in the last 168 hours. No results for input(s): "AMMONIA" in the last 168 hours. Coagulation Profile: No results for input(s): "INR", "PROTIME" in the last 168 hours. Cardiac Enzymes: No results for input(s): "CKTOTAL", "CKMB", "CKMBINDEX", "TROPONINI" in the last 168 hours. BNP (last 3 results) No results for input(s): "PROBNP" in the last 8760 hours. HbA1C: No results for input(s): "HGBA1C" in the last 72 hours. CBG: Recent Labs  Lab 03/14/22 2059  GLUCAP 165*    Lipid Profile: No results for input(s): "CHOL", "HDL", "LDLCALC", "TRIG", "CHOLHDL", "LDLDIRECT" in the last 72 hours. Thyroid Function Tests: No results for input(s): "TSH", "T4TOTAL", "FREET4", "T3FREE", "THYROIDAB" in the last 72 hours. Anemia Panel: No results for input(s): "VITAMINB12", "FOLATE", "FERRITIN", "TIBC", "IRON", "RETICCTPCT" in the last 72 hours. Sepsis Labs: No results for input(s): "PROCALCITON", "LATICACIDVEN" in the last 168  hours.  Recent Results (from the past 240 hour(s))  MRSA Next Gen by PCR, Nasal     Status: None   Collection Time: 03/12/22  7:03 AM   Specimen: Nasal Mucosa; Nasal Swab  Result Value Ref Range Status   MRSA by PCR Next Gen NOT DETECTED NOT DETECTED Final    Comment: (NOTE) The GeneXpert MRSA Assay (FDA approved for NASAL specimens only), is one component of a comprehensive MRSA colonization surveillance program. It is not intended to diagnose MRSA infection nor to guide or monitor treatment for MRSA infections. Test performance is not FDA approved in patients less than 36 years old. Performed at [facility], [place] [address], [place], [place] [address]       Radiology Studies: No results found.    Scheduled Meds:  apixaban  5 mg Oral BID   dexamethasone  3 mg  Oral Daily   docusate sodium  100 mg Oral BID   feeding supplement  1 Container Oral TID BM   gabapentin  300 mg Oral BID   mouth rinse  15 mL Mouth Rinse 4 times per day   pantoprazole  40 mg Oral BID   polyethylene glycol  17 g Oral BID   senna  1 tablet Oral QHS   Continuous Infusions:  sodium chloride Stopped (02/02/22 0929)     LOS: 47 days   15 minutes  Verneita Griffes, MD [facility] 12:39 PM   Available via Epic secure chat 7am-7pm After these hours, please refer to coverage provider listed on [email or web address]

## 2022-03-18 NOTE — TOC Progression Note (Signed)
Transition of Care [facility]) - Progression Note    Patient Details  Name: Jennifer Pacheco MRN: [id number] Date of Birth: 03-Apr-1956  Transition of Care [facility]) CM/SW [place], RN Phone Number: 03/18/2022, 9:35 AM  Clinical Narrative:    CM called and spoke with Doreen Beam, SW at [place] and made him aware that PT/OT have signed out on the patient for continued services for therapy at the hospital she the patient is not progressing with therapy due to her inability to participate effectively relating to her short term memory deficits.  I spoke with Amalia Hailey, SW and explained that I will forward him clinicals from Wadie Lessen, NP with Palliative care that she is requesting that palliative care follow the patient to the SNF/ LTC facility once guardianship is obtained and DSS is assigned as the guardian.  I asked that [place], SW be aware that the patient would likely need LTC Medicaid and disability looked in to once DSS becomes the guardian and has access to the patient's financials.  Patient will need SNF placement with Palliative care following at the facility.  CM and MSW with DTP Team will continue to follow the patient for pending guardianship scheduled for 03/26/22 through [facility] court system and DSS.   Expected Discharge Plan: (P) Skilled Nursing Facility Barriers to Discharge: (P) Continued Medical Work up, Unsafe home situation, Family Issues  Expected Discharge Plan and Services Expected Discharge Plan: (P) [place] In-house Referral: Clinical Social Work, Hospice / [place] Acute Care Choice: [place] Living arrangements for the past 2 months: Single Family Home                                       Social Determinants of Health (SDOH) Interventions    Readmission Risk Interventions    02/11/2022    2:16 PM  Readmission Risk Prevention Plan  Transportation Screening Complete   PCP or Specialist Appt within 5-7 Days Complete  Home Care Screening Complete  Medication Review (RN CM) Complete

## 2022-03-19 DIAGNOSIS — I2609 Other pulmonary embolism with acute cor pulmonale: Secondary | ICD-10-CM | POA: Diagnosis not present

## 2022-03-19 LAB — CBC
HCT: 39.9 % (ref 36.0–46.0)
Hemoglobin: 13 g/dL (ref 12.0–15.0)
MCH: 29.3 pg (ref 26.0–34.0)
MCHC: 32.6 g/dL (ref 30.0–36.0)
MCV: 90.1 fL (ref 80.0–100.0)
Platelets: 376 10*3/uL (ref 150–400)
RBC: 4.43 MIL/uL (ref 3.87–5.11)
RDW: 17.8 % — ABNORMAL HIGH (ref 11.5–15.5)
WBC: 13.3 10*3/uL — ABNORMAL HIGH (ref 4.0–10.5)
nRBC: 0 % (ref 0.0–0.2)

## 2022-03-19 LAB — BASIC METABOLIC PANEL
Anion gap: 6 (ref 5–15)
BUN: 25 mg/dL — ABNORMAL HIGH (ref 8–23)
CO2: 29 mmol/L (ref 22–32)
Calcium: 8.7 mg/dL — ABNORMAL LOW (ref 8.9–10.3)
Chloride: 101 mmol/L (ref 98–111)
Creatinine, Ser: 0.7 mg/dL (ref 0.44–1.00)
GFR, Estimated: >60 mL/min (ref >60)
Glucose, Bld: 158 mg/dL — ABNORMAL HIGH (ref 70–99)
Potassium: 3.8 mmol/L (ref 3.5–5.1)
Sodium: 136 mmol/L (ref 135–145)

## 2022-03-19 NOTE — Progress Notes (Signed)
PROGRESS NOTE    Jennifer Pacheco  JJK:[id number] DOB: [birth year] DOA: 01/30/2022 PCP: [name], NP     Brief Narrative:  66 y.o.  female with history of DM-2, HTN who presented to [facility] ED on 6/2 after she was found confused/down in feces/urine-she was hypotensive/hypoxic-she was found to have a submassive PE-CT head showed right basal ganglia mass with mass effect-she was transferred to [place] she was monitored and started on anticoagulation.  Unfortunately further hospital course was complicated by development of a retroperitoneal bleed and acute blood loss anemia.  Heparin was discontinued-IVC filter was placed-and upon further stability-patient was transferred to [facility].  Upon further neuroimaging studies-brain mass was consistent with a meningioma.  Further hospital course was complicated by persistent cognitive dysfunction (likely chronic-social worker working on guardianship) and development of significant LLE swelling with massive clot burden.  After discussion with vascular surgery-she was rechallenged with anticoagulation which she has tolerated without any signs of rebleeding.  Currently remains medically stable-and awaiting SNF/guardianship process.   Significant studies: 6/2>> CTA chest: PE with CT evidence of RV strain. 6/2>> CT abdomen/pelvis: Cholelithiasis, CT findings of left pyelonephritis, esophageal/patulous/thickened distally 6/2>> CT head: Large hypodense/irregular mass in the right basal ganglia/right insula-with surrounding edema. 6/3>> RUQ ultrasound: Cholelithiasis/mild pericholecystic fluid/mild gallbladder wall thickening. 6/3>> MRI brain no contrast: 5.2 x 5.3 cm mass on the right-with mass effect and vasogenic edema 6/3>> Echo: EF 37-16%, RV systolic function normal 9/6>> CT angio GI bleed: No areas of active extravasation-large left-sided retroperitoneal hematoma. 6/14>> MRI brain with/without contrast: 5.9 x 5.4 x 5.9 enhancing mass in the right  frontotemporal/periinsular region-favored extra-axial in origin and favored to reflect a meningioma. 6/23>> CT head: Large meningioma involving the right frontotemporal region with associated vasogenic edema/regional mass effect 7/5>> acute DVT involving left common iliac, external iliac, common femoral, left femoral, left proximal profunda, left popliteal, left posterior tibial, left peroneal, left gastrocnemius  Procedures: 6/5>> IVC filter by IR   Consults: PCCM, neurosurgery, GI, IR, palliative care, vascular surgery  New events last 24 hours / Subjective:  No distress Seems confused No overt pain  Assessment & Plan:   Principal Problem:   Acute pulmonary embolism with acute cor pulmonale (HCC) Active Problems:   ABLA likely due to retroperitoneal bleed   Acute metabolic encephalopathy   Brain mass with vasogenic edema and mass effect   Esophageal dysphagia   Hypovolemic shock (HCC)   Acute respiratory failure with hypoxia (HCC)   AKI (acute kidney injury) (HCC)   Controlled NIDDM-2   Physical deconditioning   Pressure injury of skin   Submassive PE with acute cor pulmonale: Did not tolerate IV heparin on initial presentation and developed a retroperitoneal bleed. IVC filter placed.  7/5 developed a significant DVT in LLE.  Now stable on eliquis.-Periodic lab draws   LLE DVT: Significant clot burden in LLE on Doppler ultrasound on 7/5. Difficult situation, has IVC filter in place, previous MD discussed with vascular surgery Dr. Stanford Breed on 7/6. Deemed not a candidate for thrombectomy, recommendations were to rechallenge with IV heparin- tolerated well for 72 hour-- transitioned to Eliquis on 7/9.  Check CBC periodically    Acute blood loss anemia due to retroperitoneal bleed: Required several units of PRBC transfusion.  Now stable.    Acute metabolic encephalopathy: Multifactorial due to acute illness/brain mass, suspicion for some underlying cognitive issue (per prior  note-estranged from family-does strange things-fails to recognize her brother at times). Apparently has some impaired hearing which could  contribute. Vitamin B12/TSH/RPR all stable.    Large right basal ganglia mass with surrounding edema/11 mm midline shift: Continues to have left hemiparesis. Continue steroids. Per prior notes, now thought to be a meningioma. Neurosurgery recommending outpatient follow-up in 2-3 months for reevaluation/craniotomy.  Previously MD spoke with neurosurgeon Dr. Reatha Armour on 7/6. No contraindication for anticoagulation. Suggest that we slowly taper off Decadron in 2 weeks.   Multifactorial shock-due to hypovolemia/submassive PE/septic shock (pyelonephritis): Completed antibiotics. BP stable.    Acute hypoxic respiratory failure: Resolved.   Distal esophageal thickening: Noted on CT. Evaluated by GI, recommendations are for PPI. EGD deferred for now, could be possibly done in the outpatient setting.   DM-2 (A1c 5.5 on 6/2): CBG stable   Deconditioning: Evaluated by therapy. Recommendations are for SNF.   Other issues: APS involved-social work following-guardianship process underway-as patient does not have capacity.  Discussed with social work Arbie Cookey on 7/6-no family member wants to make any decisions   DVT prophylaxis:  Place and maintain sequential compression device Start: 02/02/22 1450 apixaban (ELIQUIS) tablet 5 mg  Code Status: Full Family Communication: None  Disposition Plan:  Status is: Inpatient Remains inpatient appropriate because: guardianship and placement pending.    Antimicrobials:    Objective: Vitals:   03/18/22 1642 03/18/22 2043 03/19/22 0449 03/19/22 0918  BP: 117/80 105/72 127/84 112/74  Pulse: 81 78 (!) 57 60  Resp: '19 19 18 18  '$ Temp: 98.9 F (37.2 C) 98 F (36.7 C) 98.3 F (36.8 C) 98 F (36.7 C)  TempSrc:   Oral Oral  SpO2: 96% 96% 100% 100%  Weight:      Height:        Intake/Output Summary (Last 24 hours) at 03/19/2022  1408 Last data filed at 03/19/2022 1300 Gross per 24 hour  Intake 830 ml  Output 1300 ml  Net -470 ml    Filed Weights   03/09/22 0533 03/12/22 0733 03/13/22 0510  Weight: 68.9 kg 68.9 kg 70 kg    Examination:  No huge changes from prior   Awake disheveled  No twisting of mouth Chest clear Abdomen soft Left leg swollen >right side  Data Reviewed: I have personally reviewed following labs and imaging studies  CBC: Recent Labs  Lab 03/16/22 0623 03/19/22 0542  WBC 10.0 13.3*  HGB 13.0 13.0  HCT 39.5 39.9  MCV 89.4 90.1  PLT 385 829    Basic Metabolic Panel: Recent Labs  Lab 03/16/22 0623 03/19/22 0542  NA 136 136  K 3.7 3.8  CL 103 101  CO2 27 29  GLUCOSE 106* 158*  BUN 21 25*  CREATININE 0.64 0.70  CALCIUM 8.7* 8.7*    GFR: Estimated Creatinine Clearance: 68.8 mL/min (by C-G formula based on SCr of 0.7 mg/dL). Liver Function Tests: No results for input(s): "AST", "ALT", "ALKPHOS", "BILITOT", "PROT", "ALBUMIN" in the last 168 hours. No results for input(s): "LIPASE", "AMYLASE" in the last 168 hours. No results for input(s): "AMMONIA" in the last 168 hours. Coagulation Profile: No results for input(s): "INR", "PROTIME" in the last 168 hours. Cardiac Enzymes: No results for input(s): "CKTOTAL", "CKMB", "CKMBINDEX", "TROPONINI" in the last 168 hours. BNP (last 3 results) No results for input(s): "PROBNP" in the last 8760 hours. HbA1C: No results for input(s): "HGBA1C" in the last 72 hours. CBG: Recent Labs  Lab 03/14/22 2059  GLUCAP 165*    Lipid Profile: No results for input(s): "CHOL", "HDL", "LDLCALC", "TRIG", "CHOLHDL", "LDLDIRECT" in the last 72 hours. Thyroid Function Tests:  No results for input(s): "TSH", "T4TOTAL", "FREET4", "T3FREE", "THYROIDAB" in the last 72 hours. Anemia Panel: No results for input(s): "VITAMINB12", "FOLATE", "FERRITIN", "TIBC", "IRON", "RETICCTPCT" in the last 72 hours. Sepsis Labs: No results for input(s):  "PROCALCITON", "LATICACIDVEN" in the last 168 hours.  Recent Results (from the past 240 hour(s))  MRSA Next Gen by PCR, Nasal     Status: None   Collection Time: 03/12/22  7:03 AM   Specimen: Nasal Mucosa; Nasal Swab  Result Value Ref Range Status   MRSA by PCR Next Gen NOT DETECTED NOT DETECTED Final    Comment: (NOTE) The GeneXpert MRSA Assay (FDA approved for NASAL specimens only), is one component of a comprehensive MRSA colonization surveillance program. It is not intended to diagnose MRSA infection nor to guide or monitor treatment for MRSA infections. Test performance is not FDA approved in patients less than 76 years old. Performed at [facility], [place] [address] [place], [place] [address]       Radiology Studies: No results found.    Scheduled Meds:  apixaban  5 mg Oral BID   dexamethasone  3 mg Oral Daily   docusate sodium  100 mg Oral BID   feeding supplement  1 Container Oral TID BM   gabapentin  300 mg Oral BID   mouth rinse  15 mL Mouth Rinse 4 times per day   pantoprazole  40 mg Oral BID   polyethylene glycol  17 g Oral BID   senna  1 tablet Oral QHS   Continuous Infusions:  sodium chloride Stopped (02/02/22 0929)     LOS: 48 days   25 minutes  Verneita Griffes, MD [facility] 2:08 PM   Available via Epic secure chat 7am-7pm After these hours, please refer to coverage provider listed on [email or web address]

## 2022-03-20 NOTE — Progress Notes (Signed)
Seen and examined no new changes related to overall plan of care awaiting guardianship hearing  She is quite hemiparetic in the left lower extremity  Verneita Griffes, MD [facility] 4:40 PM

## 2022-03-21 DIAGNOSIS — I2609 Other pulmonary embolism with acute cor pulmonale: Secondary | ICD-10-CM | POA: Diagnosis not present
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
## 2022-03-22 NOTE — Plan of Care (Signed)
  Problem: Health Behavior/Discharge Planning: Goal: Ability to manage health-related needs will improve Outcome: Progressing   

## 2022-03-22 NOTE — Progress Notes (Signed)
Patient seen and examined at the bedside and continues to remain pretty stable-GERD currently with awaiting disposition planning as per difficult to place team Patient has a court hearing She is a little bit more coherent today she continues to left extremity weakness  Verneita Griffes, MD [facility] 1:57 PM

## 2022-03-23 NOTE — Progress Notes (Signed)
OT Cancellation Note  Patient Details Name: Jennifer Pacheco MRN: [id number] DOB: 06-18-1956   Cancelled Treatment:    Reason Eval/Treat Not Completed: Patient declined, no reason specified;Other (comment) pt greeted upright in bed eating breakfast. Pt asking " am I weird if I eat this applesauce with my fingers?" Pt reports she doesn't want any help and that "we ( staff) are crawling all over her." Will continue efforts to see pt for skilled OT intervention.   Harley Alto., COTA/L Acute Rehabilitation Services [phone]   Precious Haws 03/23/2022, 9:56 AM

## 2022-03-23 NOTE — Progress Notes (Signed)
No change to POC  Await hearing  Verneita Griffes, MD [facility] 3:26 PM

## 2022-03-23 NOTE — Progress Notes (Signed)
CSW spoke with Doreen Beam of [place] to confirm the upcoming court hearing of 7/27 at [place] and Lissa Merlin of [facility]'s legal team will be present via Web[employer] for the hearing.  Madilyn Fireman, MSW, LCSW Transitions of Care  Clinical Social Worker II [phone]

## 2022-03-24 DIAGNOSIS — I2609 Other pulmonary embolism with acute cor pulmonale: Secondary | ICD-10-CM | POA: Diagnosis not present

## 2022-03-24 MED ORDER — DEXAMETHASONE 0.5 MG PO TABS
1.0000 mg | ORAL_TABLET | Freq: Every day | ORAL | Status: DC
  Administered 2022-03-25 – 2022-04-01 (×8): 1 mg via ORAL
  Filled 2022-03-24 (×8): qty 2
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
## 2022-03-25 DIAGNOSIS — G9341 Metabolic encephalopathy: Secondary | ICD-10-CM | POA: Diagnosis not present

## 2022-03-25 DIAGNOSIS — J9601 Acute respiratory failure with hypoxia: Secondary | ICD-10-CM | POA: Diagnosis not present

## 2022-03-25 DIAGNOSIS — I2609 Other pulmonary embolism with acute cor pulmonale: Secondary | ICD-10-CM | POA: Diagnosis not present

## 2022-03-25 LAB — GLUCOSE, CAPILLARY: Glucose-Capillary: 227 mg/dL — ABNORMAL HIGH (ref 70–99)

## 2022-03-25 NOTE — Progress Notes (Signed)
Progress Note   Patient: Jennifer Pacheco SHF:[id number] DOB: July 25, 1956 DOA: 01/30/2022     54 DOS: the patient was seen and examined on 03/25/2022   Brief hospital course: 66 year old F with PMH of DM-2 and HTN presented to [facility] ED on 6/2 with AMS after she was found down altered in feces and urine by someone who went to check on her.  She reports difficulty getting up after fall on her left buttock.  Unclear how long she was down.  She was found to be hypoxic and hypotensive.  She was started on IV fluids, vasopressors, supplemental oxygen and broad-spectrum antibiotics.  CT chest showed submassive PE with RV/LV ratio 1.2-1.5.  She was started on heparin. CT abd and UA concerning for UTI w/ possible pyelonephritis. CXR unremarkable. 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 NSG consulted recommend MRI and decadron, and transfer to [facility].    Patient was in ICU since arrival at [facility].  She had ABLA likely due to retroperitoneal bleed.  Heparin was discontinued.  She had IVC filter placed.  She came off vasopressor on 6/5.  She was transferred [facility] service on 6/8.  She is currently stable on room air.    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  Neurosurgery recommends outpatient follow-up in 2 to 3 months for reevaluation and possible surgical planning.  Therapy evaluation recommending SNF.  APS involved in the case.  Palliative care medicine following.  She is medically stable for discharge at this time. She has a court date set for July 7 at 10 AM, moving forward with guardianship. No new events overnight.   Currently pending placement and safe dispo.     Assessment and Plan: Submassive PE with acute cor pulmonale: Did not  tolerate IV heparin on initial presentation and developed a retroperitoneal bleed. IVC filter placed.  7/5 developed a significant DVT in LLE.  Now stable on eliquis.-Periodic lab draws   LLE DVT: Significant clot burden in LLE on Doppler ultrasound on 7/5.  has IVC filter in place, previous MD discussed with vascular surgery Dr. Stanford Breed on 7/6. not a candidate for thrombectomy, recommendations were to rechallenge with IV heparin- tolerated well for 72 hour-- transitioned to Eliquis on 7/9.  Check CBC periodically    Acute blood loss anemia due to retroperitoneal bleed: Required several units of PRBC transfusion.  Now stable.    Acute metabolic encephalopathy: Multifactorial due to acute illness/brain mass, suspicion for some underlying cognitive issue (per prior note-estranged from family-does strange things-fails to recognize her brother at times). Apparently has some impaired hearing contributing. Vitamin B12/TSH/RPR all stable.    Large right basal ganglia mass with surrounding edema/11 mm midline shift: Continues to have left hemiparesis. Continue steroids. Per prior notes, now thought to be a meningioma. Neurosurgery recommending outpatient follow-up in 2-3 months for reevaluation/craniotomy, probably in early September 2023, will need to re-engage with Dr. Saintclair Halsted prior to d/c to SNF. Previously MD spoke with neurosurgeon Dr. Reatha Armour on 7/6. No contraindication for anticoagulation. Tapering decadron from 3 to 1 mg daily on 7/25 Continues to have left lower extremity hemiparesis   Multifactorial shock-due to hypovolemia/submassive PE/septic shock (pyelonephritis): Completed antibiotics. BP stable.    Acute hypoxic respiratory failure: Resolved.   Distal  esophageal thickening: Noted on CT. Evaluated by GI, recommendations are for PPI. EGD deferred for now, could be possibly done in the outpatient setting.   DM-2 (A1c 5.5 on 6/2): CBG stable   Deconditioning: Evaluated by therapy. Recommendations  are for SNF.   Other issues: APS involved-social work following-guardianship process underway-as patient does not have capacity.  TOC following       Subjective: Pleasantly confused this AM  Physical Exam: Vitals:   03/24/22 1718 03/24/22 2047 03/25/22 0456 03/25/22 0947  BP: 123/84 125/79 140/88 128/73  Pulse: 68 64 60 60  Resp: '18 17 18 16  '$ Temp: 98 F (36.7 C) 98.5 F (36.9 C) 98.8 F (37.1 C) 98.4 F (36.9 C)  TempSrc:  Oral Axillary Oral  SpO2: 97% 97% 96% 98%  Weight:      Height:       General exam: Awake, laying in bed, in nad Respiratory system: Normal respiratory effort, no wheezing Cardiovascular system: regular rate, s1, s2 Gastrointestinal system: Soft, nondistended, positive BS Central nervous system: CN2-12 grossly intact, strength intact Extremities: Perfused, no clubbing Skin: Normal skin turgor, no notable skin lesions seen Psychiatry: Mood normal // no visual hallucinations   Data Reviewed:  There are no new results to review at this time.  Family Communication: Pt in room, family not at bedside  Disposition: Status is: Inpatient Remains inpatient appropriate because: Severity of illness, d/c planning  Planned Discharge Destination: Skilled nursing facility    Author: Marylu Lund, MD 03/25/2022 12:33 PM  For on call review [email or web address].

## 2022-03-25 NOTE — Progress Notes (Signed)
Brief Nutrition Follow-up:  Pt remains medically stable for discharge per Attending MD. [facility] team following; pt with upcoming court hearing on 7/27 at 10am regarding guardianship.   Recorded po intake 50-75% of meals; pt remains on Dysphagia 3 diet with Thin Liquids. Pt also receiving Magic Cup, Pudding on meal trays. Pt also has Boost Breeze ordered.   Weight has been stable. No new skin breakdown; stage II wounds with granulation  Last BM 7/25  RD will sign off. No further nutrition interventions planned at this time.  Please re-consult as needed.   Kerman Passey MS, RDN, LDN, CNSC Registered Dietitian 3 Clinical Nutrition RD Pager and On-Call Pager Number Located in [place]

## 2022-03-26 DIAGNOSIS — I2609 Other pulmonary embolism with acute cor pulmonale: Secondary | ICD-10-CM | POA: Diagnosis not present

## 2022-03-26 DIAGNOSIS — G9341 Metabolic encephalopathy: Secondary | ICD-10-CM | POA: Diagnosis not present

## 2022-03-26 MED ORDER — METHOCARBAMOL 500 MG PO TABS
500.0000 mg | ORAL_TABLET | Freq: Three times a day (TID) | ORAL | Status: DC | PRN
Start: 2022-03-26 — End: 2022-04-01
  Administered 2022-03-28 – 2022-03-30 (×2): 500 mg via ORAL
  Filled 2022-03-26 (×2): qty 1

## 2022-03-26 NOTE — Progress Notes (Signed)
Progress Note   Patient: Jennifer Pacheco YBW:[id number] DOB: [birth year] DOA: 01/30/2022     65 DOS: the patient was seen and examined on 03/26/2022   Brief hospital course: 66 year old F with PMH of DM-2 and HTN presented to [facility] ED on 6/2 with AMS after she was found down altered in feces and urine by someone who went to check on her.  She reports difficulty getting up after fall on her left buttock.  Unclear how long she was down.  She was found to be hypoxic and hypotensive.  She was started on IV fluids, vasopressors, supplemental oxygen and broad-spectrum antibiotics.  CT chest showed submassive PE with RV/LV ratio 1.2-1.5.  She was started on heparin. CT abd and UA concerning for UTI w/ possible pyelonephritis. CXR unremarkable. 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 NSG consulted recommend MRI and decadron, and transfer to [facility].    Patient was in ICU since arrival at [facility].  She had ABLA likely due to retroperitoneal bleed.  Heparin was discontinued.  She had IVC filter placed.  She came off vasopressor on 6/5.  She was transferred to [facility] hospital[facility] service on 6/8.  She is currently stable on room air.    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  Neurosurgery recommends outpatient follow-up in 2 to 3 months for reevaluation and possible surgical planning.  Therapy evaluation recommending SNF.  APS involved in the case.  Palliative care medicine following.  She is medically stable for discharge at this time. She has a court date set for July 7 at 10 AM, moving forward with guardianship. No new events overnight.   Currently pending placement and safe dispo.    Assessment and Plan: Submassive PE with acute cor pulmonale: Did not  tolerate IV heparin on initial presentation and developed a retroperitoneal bleed. IVC filter placed.  7/5 developed a significant DVT in LLE.  Now stable on eliquis.-Will check periodic lab draws   LLE DVT: Significant clot burden in LLE on Doppler ultrasound on 7/5.  has IVC filter in place, previous MD discussed with vascular surgery Dr. Stanford Breed on 7/6. not a candidate for thrombectomy, recommendations were to rechallenge with IV heparin- tolerated well for 72 hour-- transitioned to Eliquis on 7/9.  Check CBC periodically    Acute blood loss anemia due to retroperitoneal bleed: Required several units of PRBC transfusion.  Now stable.    Acute metabolic encephalopathy: Multifactorial due to acute illness/brain mass, suspicion for some underlying cognitive issue (per prior note-estranged from family-does strange things-fails to recognize her brother at times). Apparently has some impaired hearing contributing. Vitamin B12/TSH/RPR all stable.    Large right basal ganglia mass with surrounding edema/11 mm midline shift: Continues to have left hemiparesis. Continue steroids. Per prior notes, now thought to be a meningioma. Neurosurgery recommending outpatient follow-up in 2-3 months for reevaluation/craniotomy, probably in early September 2023, will need to re-engage with Dr. Saintclair Halsted prior to d/c to SNF. Previously MD spoke with neurosurgeon Dr. Reatha Armour on 7/6. No contraindication for anticoagulation. Tapering decadron from 3 to 1 mg daily on 7/25 Continues to have left lower extremity hemiparesis   Multifactorial shock-due to hypovolemia/submassive PE/septic shock (pyelonephritis): Completed antibiotics. BP stable.    Acute hypoxic respiratory failure: Resolved.  Distal esophageal thickening: Noted on CT. Evaluated by GI, recommendations are for PPI. EGD deferred for now, could be possibly done in the outpatient setting.   DM-2 (A1c 5.5 on 6/2): CBG stable   Deconditioning: Evaluated by therapy.  Recommendations are for SNF.   Other issues: APS involved-social work following-guardianship process underway-as patient does not have capacity.  TOC continues to follow. On 7/27, pt was s/p court hearing where pt was deemed to be incompetent and [place] has been appointed interim guardian       Subjective: Remains pleasantly confused  Physical Exam: Vitals:   03/25/22 1731 03/25/22 2106 03/26/22 0355 03/26/22 1002  BP: 110/71 108/70 119/75 120/79  Pulse: 77 67 (!) 59 61  Resp: '16 18 17 18  '$ Temp: 98.4 F (36.9 C) 98.6 F (37 C) 98.9 F (37.2 C) 98.8 F (37.1 C)  TempSrc: Oral Oral Axillary   SpO2: 95% 97% 97% 96%  Weight:      Height:       General exam: Conversant, in no acute distress Respiratory system: normal chest rise, clear, no audible wheezing Cardiovascular system: regular rhythm, s1-s2 Gastrointestinal system: Nondistended, nontender, pos BS Central nervous system: No seizures, no tremors Extremities: No cyanosis, no joint deformities Skin: No rashes, no pallor Psychiatry: Affect normal // no auditory hallucinations   Data Reviewed:  There are no new results to review at this time.  Family Communication: Pt in room, family not at bedside  Disposition: Status is: Inpatient Remains inpatient appropriate because: Severity of illness, d/c planning  Planned Discharge Destination: Skilled nursing facility    Author: Marylu Lund, MD 03/26/2022 2:44 PM  For on call review [email or web address].

## 2022-03-26 NOTE — Progress Notes (Signed)
CSW participated in the interim court hearing on the patient's behalf. Patient was deemed to be incompetent and [place] has been appointed interim guardian.  Madilyn Fireman, MSW, LCSW Transitions of Care  Clinical Social Worker II [phone]

## 2022-03-26 NOTE — Plan of Care (Signed)
  Problem: Education: Goal: Knowledge of General Education information will improve Description: Including pain rating scale, medication(s)/side effects and non-pharmacologic comfort measures Outcome: Progressing   Problem: Health Behavior/Discharge Planning: Goal: Ability to manage health-related needs will improve Outcome: Progressing   Problem: Clinical Measurements: Goal: Ability to maintain clinical measurements within normal limits will improve Outcome: Progressing Goal: Will remain free from infection Outcome: Progressing Goal: Diagnostic test results will improve Outcome: Progressing Goal: Respiratory complications will improve Outcome: Progressing Goal: Cardiovascular complication will be avoided Outcome: Progressing   Problem: Activity: Goal: Risk for activity intolerance will decrease Outcome: Progressing   Problem: Nutrition: Goal: Adequate nutrition will be maintained Outcome: Progressing   Problem: Coping: Goal: Level of anxiety will decrease Outcome: Progressing   Problem: Elimination: Goal: Will not experience complications related to bowel motility Outcome: Progressing Goal: Will not experience complications related to urinary retention Outcome: Progressing   Problem: Pain Managment: Goal: General experience of comfort will improve Outcome: Progressing   Problem: Safety: Goal: Ability to remain free from injury will improve Outcome: Progressing   Problem: Skin Integrity: Goal: Risk for impaired skin integrity will decrease Outcome: Progressing   Problem: Education: Goal: Understanding of CV disease, CV risk reduction, and recovery process will improve Outcome: Progressing Goal: Individualized Educational Video(s) Outcome: Progressing   Problem: Activity: Goal: Ability to return to baseline activity level will improve Outcome: Progressing   Problem: Cardiovascular: Goal: Ability to achieve and maintain adequate cardiovascular perfusion  will improve Outcome: Progressing Goal: Vascular access site(s) Level 0-1 will be maintained Outcome: Progressing   Problem: Health Behavior/Discharge Planning: Goal: Ability to safely manage health-related needs after discharge will improve Outcome: Progressing   Problem: Nutrition: Goal: Adequate nutrition will be maintained Outcome: Progressing   Problem: Coping: Goal: Level of anxiety will decrease Outcome: Progressing   Problem: Elimination: Goal: Will not experience complications related to bowel motility Outcome: Progressing Goal: Will not experience complications related to urinary retention Outcome: Progressing

## 2022-03-27 DIAGNOSIS — I2609 Other pulmonary embolism with acute cor pulmonale: Secondary | ICD-10-CM | POA: Diagnosis not present

## 2022-03-27 DIAGNOSIS — D62 Acute posthemorrhagic anemia: Secondary | ICD-10-CM | POA: Diagnosis not present

## 2022-03-27 NOTE — Progress Notes (Addendum)
12:45pm: CSW sent secure e-mail to [place] [employer] [place] requesting information on assigned guardianship Education officer, museum.   9am: CSW created new FL2 and faxed patient's information out to facilities in attempt to obtain bed offers.  Madilyn Fireman, MSW, LCSW Transitions of Care  Clinical Social Worker II [phone]

## 2022-03-27 NOTE — NC FL2 (Signed)
[place] LEVEL OF CARE SCREENING TOOL     IDENTIFICATION  Patient Name: Jennifer Pacheco Birthdate: 10/26/1955 Sex: female Admission Date (Current Location): 01/30/2022  [facility] and [place] Number:  Herbalist and Address:  [facility] [place], [place] [address] [place], [place] [address]      Provider Number: [id number]  Attending Physician Name and Address:  Donne Hazel, MD  Relative Name and Phone Number:       Current Level of Care: Hospital Recommended Level of Care: [place] Prior Approval Number:    Date Approved/Denied:   PASRR Number: [id number]  Discharge Plan: SNF    Current Diagnoses: Patient Active Problem List   Diagnosis Date Noted   AKI (acute kidney injury) ([place]) 02/06/2022   ABLA likely due to retroperitoneal bleed 51/88/4166   Acute metabolic encephalopathy 02/28/1600   Controlled NIDDM-2 02/05/2022   Physical deconditioning 02/05/2022   Esophageal dysphagia    Pressure injury of skin 01/31/2022   Acute pulmonary embolism with acute cor pulmonale (HCC) 01/30/2022   Acute respiratory failure with hypoxia (HCC)    Hypovolemic shock (HCC)    Brain mass with vasogenic edema and mass effect     Orientation RESPIRATION BLADDER Height & Weight     Self, Situation  Normal External catheter Weight: 154 lb 5.2 oz (70 kg) Height:  '5\' 5"'$  (165.1 cm)  BEHAVIORAL SYMPTOMS/MOOD NEUROLOGICAL BOWEL NUTRITION STATUS      Continent Diet  AMBULATORY STATUS COMMUNICATION OF NEEDS Skin   Total Care Verbally PU Stage and Appropriate Care   PU Stage 2 Dressing: BID                   Personal Care Assistance Level of Assistance  Bathing, Feeding, Dressing Bathing Assistance: Limited assistance Feeding assistance: Independent Dressing Assistance: Limited assistance     Functional Limitations Info  Sight, Hearing, Speech Sight Info: Impaired Hearing Info: Impaired (Hard of hearing) Speech  Info: Adequate    SPECIAL CARE FACTORS FREQUENCY  PT (By licensed PT), OT (By licensed OT)     PT Frequency: 3x weekly OT Frequency: 3x weekly            Contractures Contractures Info: Not present    Additional Factors Info  Code Status, Allergies, Psychotropic, Insulin Sliding Scale Code Status Info: Full Code Allergies Info: Penicillin Psychotropic Info: Haldol, Ativan Insulin Sliding Scale Info: Novolog sensitive sliding scale       Current Medications (03/27/2022):  This is the current hospital active medication list Current Facility-Administered Medications  Medication Dose Route Frequency Provider Last Rate Last Admin   0.9 %  sodium chloride infusion  250 mL Intravenous Continuous Mercy Riding, MD   Stopped at 02/02/22 0929   acetaminophen (TYLENOL) tablet 650 mg  650 mg Oral Q4H PRN Wendee Beavers T, MD   650 mg at 03/26/22 1205   apixaban (ELIQUIS) tablet 5 mg  5 mg Oral BID Joselyn Glassman A, RPH   5 mg at 03/26/22 2124   calcium carbonate (TUMS - dosed in mg elemental calcium) chewable tablet 200 mg of elemental calcium  1 tablet Oral TID PRN Wendee Beavers T, MD   200 mg of elemental calcium at 03/03/22 1248   dexamethasone (DECADRON) tablet 1 mg  1 mg Oral Daily Nita Sells, MD   1 mg at 03/26/22 1121   docusate sodium (COLACE) capsule 100 mg  100 mg Oral BID Mercy Riding, MD  100 mg at 03/25/22 2216   feeding supplement (BOOST / RESOURCE BREEZE) liquid 1 Container  1 Container Oral TID BM Ghimire, Henreitta Leber, MD   1 Container at 03/26/22 1500   gabapentin (NEURONTIN) tablet 300 mg  300 mg Oral BID Hosie Poisson, MD   300 mg at 03/26/22 2123   melatonin tablet 5 mg  5 mg Oral QHS PRN Shela Leff, MD   5 mg at 03/26/22 2131   methocarbamol (ROBAXIN) tablet 500 mg  500 mg Oral Q8H PRN Donne Hazel, MD       ondansetron [facility]) injection 4 mg  4 mg Intravenous Q6H PRN Wendee Beavers T, MD   4 mg at 02/12/22 0418   Oral care mouth rinse  15 mL Mouth Rinse 4  times per day Dessa Phi, DO   15 mL at 03/26/22 2125   Oral care mouth rinse  15 mL Mouth Rinse PRN Dessa Phi, DO       pantoprazole (PROTONIX) EC tablet 40 mg  40 mg Oral BID Wendee Beavers T, MD   40 mg at 03/26/22 2123   polyethylene glycol (MIRALAX / GLYCOLAX) packet 17 g  17 g Oral BID Wendee Beavers T, MD   17 g at 03/26/22 1123   senna (SENOKOT) tablet 8.6 mg  1 tablet Oral QHS Wendee Beavers T, MD   8.6 mg at 03/25/22 2217     Discharge Medications: Please see discharge summary for a list of discharge medications.  Relevant Imaging Results:  Relevant Lab Results:   Additional Information SSN: [id number]  Archie Endo, LCSW

## 2022-03-27 NOTE — Progress Notes (Signed)
Progress Note   Patient: Jennifer Pacheco [id number] DOB: 11-04-1955 DOA: 01/30/2022     56 DOS: the patient was seen and examined on 03/27/2022   Brief hospital course: 67 year old F with PMH of DM-2 and HTN presented to [facility] ED on 6/2 with AMS after she was found down altered in feces and urine by someone who went to check on her.  She reports difficulty getting up after fall on her left buttock.  Unclear how long she was down.  She was found to be hypoxic and hypotensive.  She was started on IV fluids, vasopressors, supplemental oxygen and broad-spectrum antibiotics.  CT chest showed submassive PE with RV/LV ratio 1.2-1.5.  She was started on heparin. CT abd and UA concerning for UTI w/ possible pyelonephritis. CXR unremarkable. 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 NSG consulted recommend MRI and decadron, and transfer to [facility].    Patient was in ICU since arrival at [facility].  She had ABLA likely due to retroperitoneal bleed.  Heparin was discontinued.  She had IVC filter placed.  She came off vasopressor on 6/5.  [facility] on [facility].  She is currently stable on room air.    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  Neurosurgery recommends outpatient follow-up in 2 to 3 months for reevaluation and possible surgical planning.  Therapy evaluation recommending SNF.  APS involved in the case.  Palliative care medicine following.  She is medically stable for discharge at this time. She has a court date set for July 7 at 10 AM, moving forward with guardianship. No new events overnight.   Currently pending placement and safe dispo.    Assessment and Plan: Submassive PE with acute cor pulmonale: Did not  tolerate IV heparin on initial presentation and developed a retroperitoneal bleed. IVC filter placed.  7/5 developed a significant DVT in LLE.  Now stable on eliquis.-Will recheck cbc in AM   LLE DVT: Significant clot burden in LLE on Doppler ultrasound on 7/5.  has IVC filter in place, previous MD discussed with vascular surgery Dr. Stanford Breed on 7/6. not a candidate for thrombectomy, recommendations were to rechallenge with IV heparin- tolerated well for 72 hour-- transitioned to Eliquis on 7/9.  Check CBC periodically    Acute blood loss anemia due to retroperitoneal bleed: Required several units of PRBC transfusion.  Now stable.    Acute metabolic encephalopathy: Multifactorial due to acute illness/brain mass, suspicion for some underlying cognitive issue (per prior note-estranged from family-does strange things-fails to recognize her brother at times). Apparently has some impaired hearing contributing. Vitamin B12/TSH/RPR all stable.    Large right basal ganglia mass with surrounding edema/11 mm midline shift: Continues to have left hemiparesis. Continue steroids. Per prior notes, now thought to be a meningioma. Neurosurgery recommending outpatient follow-up in 2-3 months for reevaluation/craniotomy, probably in early September 2023, will need to re-engage with Dr. Saintclair Halsted prior to d/c to SNF. Previously MD spoke with neurosurgeon Dr. Reatha Armour on 7/6. No contraindication for anticoagulation. Tapering decadron from 3 to 1 mg daily on 7/25 Continues to have left lower extremity hemiparesis   Multifactorial shock-due to hypovolemia/submassive PE/septic shock (pyelonephritis): Completed antibiotics. BP remains stable    Acute hypoxic respiratory failure: Resolved.  Distal esophageal thickening: Noted on CT. Evaluated by GI, recommendations are for PPI. EGD deferred for now, could be possibly done in the outpatient setting.   DM-2 (A1c 5.5 on 6/2): CBG stable   Deconditioning: Evaluated by therapy.  Recommendations are for SNF.   Other issues: APS involved-social work following-guardianship process underway-as patient does not have capacity.  TOC continues to follow. On 7/27, pt was s/p court hearing where pt was deemed to be incompetent and [place] has been appointed interim guardian       Subjective: Pleasantly confused this AM  Physical Exam: Vitals:   03/26/22 1002 03/26/22 1630 03/26/22 2057 03/27/22 0507  BP: 120/79 112/69 131/78 126/75  Pulse: 61 66 72 62  Resp: '18 19 19 18  '$ Temp: 98.8 F (37.1 C) 97.7 F (36.5 C) 98.8 F (37.1 C) 98.3 F (36.8 C)  TempSrc:  Oral Oral   SpO2: 96% 99% 97% 95%  Weight:      Height:       General exam: Awake, laying in bed, in nad Respiratory system: Normal respiratory effort, no wheezing Cardiovascular system: regular rate, s1, s2 Gastrointestinal system: Soft, nondistended, positive BS Central nervous system: CN2-12 grossly intact, strength intact Extremities: Perfused, no clubbing Skin: Normal skin turgor, no notable skin lesions seen Psychiatry: Mood normal // no visual hallucinations   Data Reviewed:  There are no new results to review at this time.  Family Communication: Pt in room, family not at bedside  Disposition: Status is: Inpatient Remains inpatient appropriate because: Severity of illness, d/c planning  Planned Discharge Destination: Skilled nursing facility    Author: Marylu Lund, MD 03/27/2022 1:51 PM  For on call review [email or web address].

## 2022-03-28 DIAGNOSIS — I2609 Other pulmonary embolism with acute cor pulmonale: Secondary | ICD-10-CM | POA: Diagnosis not present

## 2022-03-28 DIAGNOSIS — J9601 Acute respiratory failure with hypoxia: Secondary | ICD-10-CM | POA: Diagnosis not present

## 2022-03-28 DIAGNOSIS — D62 Acute posthemorrhagic anemia: Secondary | ICD-10-CM | POA: Diagnosis not present

## 2022-03-28 LAB — CBC
HCT: 43.5 % (ref 36.0–46.0)
Hemoglobin: 14.2 g/dL (ref 12.0–15.0)
MCH: 29 pg (ref 26.0–34.0)
MCHC: 32.6 g/dL (ref 30.0–36.0)
MCV: 88.8 fL (ref 80.0–100.0)
Platelets: 305 10*3/uL (ref 150–400)
RBC: 4.9 MIL/uL (ref 3.87–5.11)
RDW: 16.6 % — ABNORMAL HIGH (ref 11.5–15.5)
WBC: 10.7 10*3/uL — ABNORMAL HIGH (ref 4.0–10.5)
nRBC: 0 % (ref 0.0–0.2)

## 2022-03-28 LAB — COMPREHENSIVE METABOLIC PANEL
ALT: 25 U/L (ref 0–44)
AST: 16 U/L (ref 15–41)
Albumin: 2.7 g/dL — ABNORMAL LOW (ref 3.5–5.0)
Alkaline Phosphatase: 52 U/L (ref 38–126)
Anion gap: 6 (ref 5–15)
BUN: 15 mg/dL (ref 8–23)
CO2: 30 mmol/L (ref 22–32)
Calcium: 8.8 mg/dL — ABNORMAL LOW (ref 8.9–10.3)
Chloride: 103 mmol/L (ref 98–111)
Creatinine, Ser: 0.63 mg/dL (ref 0.44–1.00)
GFR, Estimated: >60 mL/min (ref >60)
Glucose, Bld: 117 mg/dL — ABNORMAL HIGH (ref 70–99)
Potassium: 3.1 mmol/L — ABNORMAL LOW (ref 3.5–5.1)
Sodium: 139 mmol/L (ref 135–145)
Total Bilirubin: 0.6 mg/dL (ref 0.3–1.2)
Total Protein: 5.5 g/dL — ABNORMAL LOW (ref 6.5–8.1)

## 2022-03-28 MED ORDER — POTASSIUM CHLORIDE CRYS ER 20 MEQ PO TBCR
40.0000 meq | EXTENDED_RELEASE_TABLET | ORAL | Status: AC
  Administered 2022-03-28 (×2): 40 meq via ORAL
  Filled 2022-03-28 (×2): qty 2

## 2022-03-28 NOTE — Progress Notes (Signed)
Progress Note   Patient: Jennifer Pacheco [id number]:[id number] DOB: 12-21-1955 DOA: 01/30/2022     57 DOS: the patient was seen and examined on 03/28/2022   Brief hospital course: 66 year old F with PMH of DM-2 and HTN presented to [facility] ED on 6/2 with AMS after she was found down altered in feces and urine by someone who went to check on her.  She reports difficulty getting up after fall on her left buttock.  Unclear how long she was down.  She was found to be hypoxic and hypotensive.  She was started on IV fluids, vasopressors, supplemental oxygen and broad-spectrum antibiotics.  CT chest showed submassive PE with RV/LV ratio 1.2-1.5.  She was started on heparin. CT abd and UA concerning for UTI w/ possible pyelonephritis. CXR unremarkable. 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 NSG consulted recommend MRI and decadron, and transfer to [facility].    Patient was in ICU since arrival at [facility].  She had ABLA likely due to retroperitoneal bleed.  Heparin was discontinued.  She had IVC filter placed.  She came off vasopressor on 6/5.  She was transferred to [facility] on 6/8.  She is currently stable on room air.    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  Neurosurgery recommends outpatient follow-up in 2 to 3 months for reevaluation and possible surgical planning.  Therapy evaluation recommending SNF.  APS involved in the case.  Palliative care medicine following.  She is medically stable for discharge at this time. She has a court date set for July 7 at 10 AM, moving forward with guardianship. No new events overnight.   Currently pending placement and safe dispo.    Assessment and Plan: Submassive PE with acute cor pulmonale: Did not  tolerate IV heparin on initial presentation and developed a retroperitoneal bleed. IVC filter placed.  7/5 developed a significant DVT in LLE.  Now stable on eliquis.-CBC remains stable   LLE DVT: Significant clot burden in LLE on Doppler ultrasound on 7/5.  has IVC filter in place, previous MD discussed with vascular surgery Dr. Stanford Breed on 7/6. not a candidate for thrombectomy, recommendations were to rechallenge with IV heparin- tolerated well for 72 hour-- transitioned to Eliquis on 7/9.  Cont to check CBC periodically    Acute blood loss anemia due to retroperitoneal bleed: Required several units of PRBC transfusion.  Hgb remains normal and stable    Acute metabolic encephalopathy: Multifactorial due to acute illness/brain mass, suspicion for some underlying cognitive issue (per prior note-estranged from family-does strange things-fails to recognize her brother at times). Apparently has some impaired hearing contributing. Vitamin B12/TSH/RPR all stable.    Large right basal ganglia mass with surrounding edema/11 mm midline shift: Continues to have left hemiparesis. Continue steroids. Per prior notes, now thought to be a meningioma. Neurosurgery recommending outpatient follow-up in 2-3 months for reevaluation/craniotomy, probably in early September 2023, will need to re-engage with Dr. Saintclair Halsted prior to d/c to SNF. Previously MD spoke with neurosurgeon Dr. Reatha Armour on 7/6. No contraindication for anticoagulation. Tapering decadron from 3 to 1 mg daily on 7/25 Continues to have left lower extremity hemiparesis   Multifactorial shock-due to hypovolemia/submassive PE/septic shock (pyelonephritis): Completed antibiotics. BP remains stable    Acute hypoxic respiratory  failure: Resolved.   Distal esophageal thickening: Noted on CT. Evaluated by GI, recommendations are for PPI. EGD deferred for now, could be possibly done in the outpatient setting.   DM-2 (A1c 5.5 on 6/2): CBG stable   Deconditioning:  Evaluated by therapy. Recommendations are for SNF.   Other issues: APS involved-social work following-guardianship process underway-as patient does not have capacity.  TOC continues to follow. On 7/27, pt was s/p court hearing where pt was deemed to be incompetent and [place] has been appointed interim guardian       Subjective: Confused this AM  Physical Exam: Vitals:   03/27/22 1700 03/27/22 2041 03/28/22 0531 03/28/22 0944  BP: 128/78 128/84 124/75 122/73  Pulse: 65 (!) 57 (!) 58 60  Resp: '19 18 18 17  '$ Temp: 98.2 F (36.8 C) 99 F (37.2 C) 97.9 F (36.6 C) 98 F (36.7 C)  TempSrc:  Oral Oral Oral  SpO2: 95% 96% 97% 97%  Weight:      Height:       General exam: Awake, laying in bed, in nad Respiratory system: Normal respiratory effort, no wheezing Cardiovascular system: regular rate, s1, s2 Gastrointestinal system: Soft, nondistended, positive BS Central nervous system: CN2-12 grossly intact, strength intact Extremities: Perfused, no clubbing Skin: Normal skin turgor, no notable skin lesions seen Psychiatry: Mood normal // no visual hallucinations   Data Reviewed:  Labs reviewed: K 3.1, Cr 0.63, Hgb 14.2  Family Communication: Pt in room, family not at bedside  Disposition: Status is: Inpatient Remains inpatient appropriate because: Severity of illness, d/c planning  Planned Discharge Destination: Skilled nursing facility    Author: Marylu Lund, MD 03/28/2022 3:10 PM  For on call review [email or web address].

## 2022-03-29 DIAGNOSIS — D62 Acute posthemorrhagic anemia: Secondary | ICD-10-CM | POA: Diagnosis not present

## 2022-03-29 DIAGNOSIS — I2609 Other pulmonary embolism with acute cor pulmonale: Secondary | ICD-10-CM | POA: Diagnosis not present

## 2022-03-29 NOTE — Progress Notes (Signed)
Progress Note   Patient: Amna Welker [id number]:[id number] DOB: 13-Jan-1956 DOA: 01/30/2022     58 DOS: the patient was seen and examined on 03/29/2022   Brief hospital course: 66 year old F with PMH of DM-2 and HTN presented to [facility] ED on 6/2 with AMS after she was found down altered in feces and urine by someone who went to check on her.  She reports difficulty getting up after fall on her left buttock.  Unclear how long she was down.  She was found to be hypoxic and hypotensive.  She was started on IV fluids, vasopressors, supplemental oxygen and broad-spectrum antibiotics.  CT chest showed submassive PE with RV/LV ratio 1.2-1.5.  She was started on heparin. CT abd and UA concerning for UTI w/ possible pyelonephritis. CXR unremarkable. 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 NSG consulted recommend MRI and decadron, and transfer to [facility].    Patient was in ICU since arrival at [facility].  She had ABLA likely due to retroperitoneal bleed.  Heparin was discontinued.  She had IVC filter placed.  She came off vasopressor on 6/5.  She was transferred to [facility] service on 6/8.  She is currently stable on room air.    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  Neurosurgery recommends outpatient follow-up in 2 to 3 months for reevaluation and possible surgical planning.  Therapy evaluation recommending SNF.  APS involved in the case.  Palliative care medicine following.  She is medically stable for discharge at this time. She has a court date set for July 7 at 10 AM, moving forward with guardianship. No new events overnight.   Currently pending placement and safe dispo.    Assessment and Plan: Submassive PE with acute cor pulmonale: Did not  tolerate IV heparin on initial presentation and developed a retroperitoneal bleed. IVC filter placed.  7/5 developed a significant DVT in LLE.  Now stable on eliquis.-CBC has remained stable    LLE DVT: Significant clot burden in LLE on Doppler ultrasound on 7/5.  has IVC filter in place, previous MD discussed with vascular surgery Dr. Stanford Breed on 7/6. not a candidate for thrombectomy, recommendations were to rechallenge with IV heparin- tolerated well for 72 hour-- transitioned to Eliquis on 7/9.  Cont to check CBC periodically    Acute blood loss anemia due to retroperitoneal bleed: Required several units of PRBC transfusion.  Hgb remains normal and stable    Acute metabolic encephalopathy: Multifactorial due to acute illness/brain mass, suspicion for some underlying cognitive issue (per prior note-estranged from family-does strange things-fails to recognize her brother at times). Apparently has some impaired hearing contributing. Vitamin B12/TSH/RPR all stable.    Large right basal ganglia mass with surrounding edema/11 mm midline shift: Continues to have left hemiparesis. Continue steroids. Per prior notes, now thought to be a meningioma. Neurosurgery recommending outpatient follow-up in 2-3 months for reevaluation/craniotomy, probably in early September 2023, will need to re-engage with Dr. Saintclair Halsted prior to d/c to SNF. Previously MD spoke with neurosurgeon Dr. Reatha Armour on 7/6. No contraindication for anticoagulation. Tapering decadron from 3 to 1 mg daily on 7/25 Continues to have left lower extremity hemiparesis   Multifactorial shock-due to hypovolemia/submassive PE/septic shock (pyelonephritis): Completed antibiotics. BP remains stable    Acute  hypoxic respiratory failure: Resolved.   Distal esophageal thickening: Noted on CT. Evaluated by GI, recommendations are for PPI. EGD deferred for now, could be possibly done in the outpatient setting.   DM-2 (A1c 5.5 on 6/2): CBG stable   Deconditioning:  Evaluated by therapy. Recommendations are for SNF.   Other issues: APS involved-social work following-guardianship process underway-as patient does not have capacity.  TOC continues to follow. On 7/27, pt was s/p court hearing where pt was deemed to be incompetent and [place] has been appointed interim guardian       Subjective: Pleasantly confused  Physical Exam: Vitals:   03/28/22 0944 03/28/22 1755 03/29/22 0415 03/29/22 0939  BP: 122/73 130/75 111/72 111/65  Pulse: 60 65 (!) 55 (!) 59  Resp: '17 17 17 16  '$ Temp: 98 F (36.7 C) 98.3 F (36.8 C) 97.9 F (36.6 C) 98.8 F (37.1 C)  TempSrc: Oral Oral Oral   SpO2: 97% 98% 99% 96%  Weight:      Height:       General exam: Conversant, in no acute distress Respiratory system: normal chest rise, clear, no audible wheezing Cardiovascular system: regular rhythm, s1-s2 Gastrointestinal system: Nondistended, nontender, pos BS Central nervous system: No seizures, no tremors Extremities: No cyanosis, no joint deformities Skin: No rashes, no pallor Psychiatry: Affect normal // no auditory hallucinations   Data Reviewed:  There are no new results to review at this time.  Family Communication: Pt in room, family not at bedside  Disposition: Status is: Inpatient Remains inpatient appropriate because: Severity of illness, d/c planning  Planned Discharge Destination: Skilled nursing facility    Author: Marylu Lund, MD 03/29/2022 3:14 PM  For on call review [email or web address].

## 2022-03-30 LAB — COMPREHENSIVE METABOLIC PANEL
ALT: 22 U/L (ref 0–44)
AST: 14 U/L — ABNORMAL LOW (ref 15–41)
Albumin: 2.7 g/dL — ABNORMAL LOW (ref 3.5–5.0)
Alkaline Phosphatase: 55 U/L (ref 38–126)
Anion gap: 6 (ref 5–15)
BUN: 17 mg/dL (ref 8–23)
CO2: 28 mmol/L (ref 22–32)
Calcium: 9 mg/dL (ref 8.9–10.3)
Chloride: 103 mmol/L (ref 98–111)
Creatinine, Ser: 0.69 mg/dL (ref 0.44–1.00)
GFR, Estimated: >60 mL/min (ref >60)
Glucose, Bld: 112 mg/dL — ABNORMAL HIGH (ref 70–99)
Potassium: 4.1 mmol/L (ref 3.5–5.1)
Sodium: 137 mmol/L (ref 135–145)
Total Bilirubin: 0.6 mg/dL (ref 0.3–1.2)
Total Protein: 5.5 g/dL — ABNORMAL LOW (ref 6.5–8.1)

## 2022-03-30 NOTE — Progress Notes (Signed)
Progress Note   Patient: Jennifer Pacheco [id number]:[id number] DOB: [birth year] DOA: 01/30/2022     59 DOS: the patient was seen and examined on 03/30/2022   Brief hospital course: 66 year old F with PMH of DM-2 and HTN presented to [facility] ED on 6/2 with AMS after she was found down altered in feces and urine by someone who went to check on her.  She reports difficulty getting up after fall on her left buttock.  Unclear how long she was down.  She was found to be hypoxic and hypotensive.  She was started on IV fluids, vasopressors, supplemental oxygen and broad-spectrum antibiotics.  CT chest showed submassive PE with RV/LV ratio 1.2-1.5.  She was started on heparin. CT abd and UA concerning for UTI w/ possible pyelonephritis. CXR unremarkable. 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 NSG consulted recommend MRI and decadron, and transfer to [facility].    Patient was in ICU since arrival at [facility].  She had ABLA likely due to retroperitoneal bleed.  Heparin was discontinued.  She had IVC filter placed.  She came off vasopressor on 6/5.  She was transferred to [facility] on 6/8.  She is currently stable on room air.    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  Neurosurgery recommends outpatient follow-up in 2 to 3 months for reevaluation and possible surgical planning.  Therapy evaluation recommending SNF.  APS involved in the case.  Palliative care medicine following.  She is medically stable for discharge at this time. She has a court date set for July 7 at 10 AM, moving forward with guardianship. No new events overnight.   Currently pending placement and safe dispo.    Assessment and Plan: Submassive PE with acute cor pulmonale: Did not  tolerate IV heparin on initial presentation and developed a retroperitoneal bleed. IVC filter placed.  7/5 developed a significant DVT in LLE.  Now stable on eliquis.-CBC has remained stable    LLE DVT: Significant clot burden in LLE on Doppler ultrasound on 7/5.  has IVC filter in place, previous MD discussed with vascular surgery Dr. Stanford Breed on 7/6. not a candidate for thrombectomy, recommendations were to rechallenge with IV heparin- tolerated well for 72 hour-- transitioned to Eliquis on 7/9.  Cont to check CBC periodically    Acute blood loss anemia due to retroperitoneal bleed: Required several units of PRBC transfusion.  Hgb remains normal and stable    Acute metabolic encephalopathy: Multifactorial due to acute illness/brain mass, suspicion for some underlying cognitive issue (per prior note-estranged from family-does strange things-fails to recognize her brother at times). Apparently has some impaired hearing contributing. Vitamin B12/TSH/RPR all stable.    Large right basal ganglia mass with surrounding edema/11 mm midline shift: Continues to have left hemiparesis. Continue steroids. Per prior notes, now thought to be a meningioma. Neurosurgery recommending outpatient follow-up in 2-3 months for reevaluation/craniotomy, probably in early September 2023, will need to re-engage with Dr. Saintclair Halsted prior to d/c to SNF. Previously MD spoke with neurosurgeon Dr. Reatha Armour on 7/6. No contraindication for anticoagulation. Tapering decadron from 3 to 1 mg daily on 7/25 Continues to have left lower extremity hemiparesis   Multifactorial shock-due to hypovolemia/submassive PE/septic shock (pyelonephritis): Completed antibiotics. BP remains stable    Acute  hypoxic respiratory failure: Resolved.   Distal esophageal thickening: Noted on CT. Evaluated by GI, recommendations are for PPI. EGD deferred for now, could be possibly done in the outpatient setting.   DM-2 (A1c 5.5 on 6/2): CBG stable   Deconditioning:  Evaluated by therapy. Recommendations are for SNF.   Other issues: APS involved-social work following-guardianship process underway-as patient does not have capacity.  TOC continues to follow. On 7/27, pt was s/p court hearing where pt was deemed to be incompetent and [place] has been appointed interim guardian. Palliative Care following as well       Subjective: without complaints. Pleasantly confused this AM  Physical Exam: Vitals:   03/29/22 0939 03/29/22 2046 03/30/22 0516 03/30/22 0954  BP: 111/65 133/85 113/65 (!) 91/49  Pulse: (!) 59 66 69 68  Resp: '16 19 17 17  '$ Temp: 98.8 F (37.1 C) 98.4 F (36.9 C) 98.4 F (36.9 C) 97.9 F (36.6 C)  TempSrc:  Oral    SpO2: 96% 99% 98% 99%  Weight:      Height:       General exam: Awake, laying in bed, in nad Respiratory system: Normal respiratory effort, no wheezing Cardiovascular system: regular rate, s1, s2 Gastrointestinal system: Soft, nondistended, positive BS Central nervous system: CN2-12 grossly intact, strength intact Extremities: Perfused, no clubbing Skin: Normal skin turgor, no notable skin lesions seen Psychiatry: Mood normal // no visual hallucinations   Data Reviewed:  Labs reviewed: Na 137, K 4.1, Cr 0.69  Family Communication: Pt in room, family not at bedside  Disposition: Status is: Inpatient Remains inpatient appropriate because: Severity of illness, d/c planning  Planned Discharge Destination: Skilled nursing facility    Author: Marylu Lund, MD 03/30/2022 4:16 PM  For on call review [email or web address].

## 2022-03-30 NOTE — Progress Notes (Signed)
CSW spoke with Jennifer Pacheco at [facility] who states she can accept the patient for long term care but needs to determine bed availability and will return call to CSW.  Madilyn Fireman, MSW, LCSW Transitions of Care  Clinical Social Worker II [phone]

## 2022-03-31 NOTE — Progress Notes (Addendum)
CSW spoke with Altha Harm at [facility] who states the patient can be accepted into the facility tomorrow, 04/01/2022.   CSW initiated insurance authorization through [facility].  CSW notified patient's legal guardian of information.  Madilyn Fireman, MSW, LCSW Transitions of Care  Clinical Social Worker II [phone]

## 2022-03-31 NOTE — Progress Notes (Signed)
Pt. Alright to not have an IV at this time per Marylu Lund, MD.  No IV meds ordered

## 2022-03-31 NOTE — Progress Notes (Signed)
Progress Note   Patient: Jennifer Pacheco WUJ:[id number] DOB: August 22, 1956 DOA: 01/30/2022     60 DOS: the patient was seen and examined on 03/31/2022   Brief hospital course: 66 year old F with PMH of DM-2 and HTN presented to [facility] ED on 6/2 with AMS after she was found down altered in feces and urine by someone who went to check on her.  She reports difficulty getting up after fall on her left buttock.  Unclear how long she was down.  She was found to be hypoxic and hypotensive.  She was started on IV fluids, vasopressors, supplemental oxygen and broad-spectrum antibiotics.  CT chest showed submassive PE with RV/LV ratio 1.2-1.5.  She was started on heparin. CT abd and UA concerning for UTI w/ possible pyelonephritis. CXR unremarkable. 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 NSG consulted recommend MRI and decadron, and transfer to [facility].    Patient was in ICU since arrival at [facility].  She had ABLA likely due to retroperitoneal bleed.  Heparin was discontinued.  She had IVC filter placed.  She came off vasopressor on 6/5.  She was transferred to [facility] hospitalist service on 6/8.  She is currently stable on room air.    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  Neurosurgery recommends outpatient follow-up in 2 to 3 months for reevaluation and possible surgical planning.  Therapy evaluation recommending SNF.  APS involved in the case.  Palliative care medicine following.  She is medically stable for discharge at this time. She has a court date set for July 7 at 10 AM, moving forward with guardianship. No new events overnight.   Currently pending placement and safe dispo.    Assessment and Plan: Submassive PE with acute cor pulmonale: Did not  tolerate IV heparin on initial presentation and developed a retroperitoneal bleed. IVC filter placed.  7/5 developed a significant DVT in LLE.  Now stable on eliquis.-CBC has remained stable    LLE DVT: Significant clot burden in LLE on Doppler ultrasound on 7/5.  has IVC filter in place, previous MD discussed with vascular surgery Dr. Stanford Breed on 7/6. not a candidate for thrombectomy, recommendations were to rechallenge with IV heparin- tolerated well for 72 hour-- now transitioned to Eliquis on 7/9. No evidence of acute blood loss   Acute blood loss anemia due to retroperitoneal bleed: Required several units of PRBC transfusion.  Hgb remains normal and stable    Acute metabolic encephalopathy: Multifactorial due to acute illness/brain mass, suspicion for some underlying cognitive issue (per prior note-estranged from family-does strange things-fails to recognize her brother at times). Apparently has some impaired hearing contributing. Vitamin B12/TSH/RPR all stable.    Large right basal ganglia mass with surrounding edema/11 mm midline shift: Continues to have left hemiparesis. Continue steroids. Per prior notes, now thought to be a meningioma.  Discussed with Neurosurgery on 8/1 who will plan and arrange outpatient follow up in 1 month to re-eval for possible craniotomy once determined stable from [place] standpoint Neurosurgery to reach out to [place] guardian to discuss possible surgery  Multifactorial shock-due to hypovolemia/submassive PE/septic shock (pyelonephritis): Completed antibiotics. BP remains stable    Acute hypoxic respiratory failure: Resolved.   Distal esophageal thickening: Noted on CT. Evaluated by GI, recommendations are for  PPI. EGD deferred for now, could be possibly done in the outpatient setting. Tolerating PO while here   DM-2 (A1c 5.5 on 6/2): CBG stable   Deconditioning: Evaluated by therapy. Recommendations are for SNF.   Other issues: APS involved-social work  following-guardianship process underway-as patient does not have capacity.  TOC continues to follow. On 7/27, pt was s/p court hearing where pt was deemed to be incompetent and [place] has been appointed interim guardian. Palliative Care following as well       Subjective: Pleasantly confused this AM  Physical Exam: Vitals:   03/30/22 2104 03/31/22 0545 03/31/22 0836 03/31/22 1143  BP: (!) 139/98 105/77 106/83 107/79  Pulse: 79 63 81 72  Resp: '16 18 18 18  '$ Temp: 98.8 F (37.1 C) 98.7 F (37.1 C) 98.7 F (37.1 C) 98.4 F (36.9 C)  TempSrc: Oral Oral Oral   SpO2: 96% 97% 97% 99%  Weight:      Height:       General exam: Conversant, in no acute distress Respiratory system: normal chest rise, clear, no audible wheezing Cardiovascular system: regular rhythm, s1-s2 Gastrointestinal system: Nondistended, nontender, pos BS Central nervous system: No seizures, no tremors Extremities: No cyanosis, no joint deformities Skin: No rashes, no pallor Psychiatry: Affect normal // no auditory hallucinations   Data Reviewed:  There are no new results to review at this time.  Family Communication: Pt in room, family not at bedside  Disposition: Status is: Inpatient Remains inpatient appropriate because: Severity of illness, d/c planning  Planned Discharge Destination: Skilled nursing facility    Author: Marylu Lund, MD 03/31/2022 3:22 PM  For on call review [email or web address].

## 2022-04-01 MED ORDER — APIXABAN 5 MG PO TABS: 5.0000 mg | ORAL_TABLET | Freq: Two times a day (BID) | ORAL | 0 refills | Status: DC

## 2022-04-01 MED ORDER — PANTOPRAZOLE SODIUM 40 MG PO TBEC: 40.0000 mg | DELAYED_RELEASE_TABLET | Freq: Two times a day (BID) | ORAL | 0 refills | Status: DC

## 2022-04-01 MED ORDER — CALCIUM CARBONATE ANTACID 500 MG PO CHEW: 1.0000 | CHEWABLE_TABLET | Freq: Three times a day (TID) | ORAL | 0 refills | Status: DC | PRN

## 2022-04-01 MED ORDER — DEXAMETHASONE 1 MG PO TABS: 1.0000 mg | ORAL_TABLET | Freq: Every day | ORAL | 0 refills | Status: AC

## 2022-04-01 MED ORDER — GABAPENTIN 300 MG PO CAPS: 300.0000 mg | ORAL_CAPSULE | Freq: Two times a day (BID) | ORAL | 0 refills | Status: DC

## 2022-04-01 NOTE — Progress Notes (Signed)
Report given to [place] at [facility]

## 2022-04-01 NOTE — Progress Notes (Signed)
Patient will be discharged to [facility]. CSW scheduled transportation for pick up at 1pm via [facility]. The number to call report is [phone].  CSW informed Doreen Beam of [facility] DSS of discharge plan.  Madilyn Fireman, MSW, LCSW Transitions of Care  Clinical Social Worker II [phone]

## 2022-04-01 NOTE — Progress Notes (Signed)
Occupational Therapy Treatment Patient Details Name: Jennifer Pacheco MRN: [id number] DOB: June 23, 1956 Today's Date: 04/01/2022   History of present illness Pt is 66 yo female who was found down in her home and taken to [facility]. 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 Pt with retroperitoneal bleed and IVC filter placed 6/5. 7/5>> acute DVT involving left common iliac, external iliac, common femoral, left femoral, left proximal profunda, left popliteal, left posterior tibial, left peroneal, left gastrocnemius PMH: DM2, HTN.   OT comments  Pt continues to be limited by impaired attention, several seconds at best this visit. Assisted onto bedpan with max assist at her request, but pt without BM. Total assist for handwashing at bed level. Per chart, pt to discharge to SNF later today.    Recommendations for follow up therapy are one component of a multi-disciplinary discharge planning process, led by the attending physician.  Recommendations may be updated based on patient status, additional functional criteria and insurance authorization.    Follow Up Recommendations  Skilled nursing-short term rehab (<3 hours/day)    Assistance Recommended at Discharge Frequent or constant Supervision/Assistance  Patient can return home with the following  Two people to help with walking and/or transfers;A lot of help with bathing/dressing/bathroom;Assistance with cooking/housework;Direct supervision/assist for medications management;Direct supervision/assist for financial management;Assist for transportation;Help with stairs or ramp for entrance   Equipment Recommendations  Other (comment);Hospital bed (hoyer lift)    Recommendations for Other Services      Precautions / Restrictions Precautions Precautions: Fall Precaution Comments: HOH Restrictions Weight Bearing Restrictions: No       Mobility Bed Mobility Overal bed mobility: Needs Assistance Bed  Mobility: Rolling Rolling: Max assist         General bed mobility comments: max cues and assist for rolling for bedpan    Transfers                         Balance                                           ADL either performed or assessed with clinical judgement   ADL Overall ADL's : Needs assistance/impaired     Grooming: Maximal assistance;Bed level;Wash/dry hands                   Toilet Transfer: Total assistance Toilet Transfer Details (indicate cue type and reason): to place bed pan                Extremity/Trunk Assessment              Vision       Perception     Praxis      Cognition Arousal/Alertness: Awake/alert Behavior During Therapy: Flat affect Overall Cognitive Status: Impaired/Different from baseline Area of Impairment: Orientation, Attention, Memory, Following commands, Safety/judgement, Awareness, Problem solving                 Orientation Level: Disoriented to, Time, Place, Situation Current Attention Level: Focused, Sustained Memory: Decreased short-term memory Following Commands: Follows one step commands with increased time, Follows one step commands inconsistently Safety/Judgement: Decreased awareness of safety, Decreased awareness of deficits Awareness: Intellectual Problem Solving: Difficulty sequencing, Requires verbal cues, Requires tactile cues, Slow processing, Decreased initiation General Comments: pt stating she needed to have a  BM, put on bed pan, but did not go        Exercises      Shoulder Instructions       General Comments      Pertinent Vitals/ Pain       Pain Assessment Pain Assessment: Faces Faces Pain Scale: Hurts a little bit Pain Location: generalized with movement Pain Descriptors / Indicators: Grimacing, Guarding Pain Intervention(s): Monitored during session, Repositioned  Home Living                                           Prior Functioning/Environment              Frequency  Min 1X/week        Progress Toward Goals  OT Goals(current goals can now be found in the care plan section)  Progress towards OT goals: Not progressing toward goals - comment  Acute Rehab OT Goals OT Goal Formulation: With patient Time For Goal Achievement: 03/31/22 Potential to Achieve Goals: [place] Discharge plan remains appropriate    Co-evaluation                 AM-PAC OT "6 Clicks" Daily Activity     Outcome Measure   Help from another person eating meals?: A Little Help from another person taking care of personal grooming?: Total Help from another person toileting, which includes using toliet, bedpan, or urinal?: Total Help from another person bathing (including washing, rinsing, drying)?: A Lot Help from another person to put on and taking off regular upper body clothing?: A Lot Help from another person to put on and taking off regular lower body clothing?: Total 6 Click Score: 10    End of Session    OT Visit Diagnosis: Muscle weakness (generalized) (M62.81);Other symptoms and signs involving cognitive function   Activity Tolerance     Patient Left in bed;with call bell/phone within reach;with bed alarm set   Nurse Communication          Time: 6333-5456 OT Time Calculation (min): 21 min  Charges: OT General Charges $OT Visit: 1 Visit OT Treatments $Self Care/Home Management : 8-22 mins  .Minerva Fester 04/01/2022, 11:04 AM
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
## 2022-04-26 ENCOUNTER — Emergency Department (HOSPITAL_COMMUNITY): Payer: Medicaid Other

## 2022-04-26 ENCOUNTER — Inpatient Hospital Stay (HOSPITAL_COMMUNITY)
Admission: EM | Admit: 2022-04-26 | Discharge: 2022-04-29 | DRG: 871 | Disposition: A | Discharge status: Hospice | Payer: Medicaid Other | Source: Skilled Nursing Facility | Attending: Critical Care Medicine | Admitting: Critical Care Medicine

## 2022-04-26 DIAGNOSIS — Z86011 Personal history of benign neoplasm of the brain: Secondary | ICD-10-CM

## 2022-04-26 DIAGNOSIS — Z86711 Personal history of pulmonary embolism: Secondary | ICD-10-CM

## 2022-04-26 DIAGNOSIS — A419 Sepsis, unspecified organism: Principal | ICD-10-CM

## 2022-04-26 DIAGNOSIS — B957 Other staphylococcus as the cause of diseases classified elsewhere: Secondary | ICD-10-CM | POA: Diagnosis present

## 2022-04-26 DIAGNOSIS — I10 Essential (primary) hypertension: Secondary | ICD-10-CM | POA: Diagnosis present

## 2022-04-26 DIAGNOSIS — R6520 Severe sepsis without septic shock: Secondary | ICD-10-CM | POA: Diagnosis present

## 2022-04-26 DIAGNOSIS — Z515 Encounter for palliative care: Secondary | ICD-10-CM | POA: Diagnosis not present

## 2022-04-26 DIAGNOSIS — Z66 Do not resuscitate: Secondary | ICD-10-CM | POA: Diagnosis not present

## 2022-04-26 DIAGNOSIS — G935 Compression of brain: Secondary | ICD-10-CM | POA: Diagnosis present

## 2022-04-26 DIAGNOSIS — Z9911 Dependence on respirator [ventilator] status: Secondary | ICD-10-CM

## 2022-04-26 DIAGNOSIS — Z880 Allergy status to penicillin: Secondary | ICD-10-CM

## 2022-04-26 DIAGNOSIS — Z7901 Long term (current) use of anticoagulants: Secondary | ICD-10-CM

## 2022-04-26 DIAGNOSIS — E870 Hyperosmolality and hypernatremia: Secondary | ICD-10-CM | POA: Diagnosis present

## 2022-04-26 DIAGNOSIS — Z86718 Personal history of other venous thrombosis and embolism: Secondary | ICD-10-CM

## 2022-04-26 DIAGNOSIS — R1319 Other dysphagia: Secondary | ICD-10-CM | POA: Diagnosis present

## 2022-04-26 DIAGNOSIS — N3000 Acute cystitis without hematuria: Secondary | ICD-10-CM

## 2022-04-26 DIAGNOSIS — E861 Hypovolemia: Secondary | ICD-10-CM | POA: Diagnosis present

## 2022-04-26 DIAGNOSIS — G8194 Hemiplegia, unspecified affecting left nondominant side: Secondary | ICD-10-CM | POA: Diagnosis present

## 2022-04-26 DIAGNOSIS — G9341 Metabolic encephalopathy: Secondary | ICD-10-CM | POA: Diagnosis present

## 2022-04-26 DIAGNOSIS — Z6822 Body mass index (BMI) 22.0-22.9, adult: Secondary | ICD-10-CM

## 2022-04-26 DIAGNOSIS — J189 Pneumonia, unspecified organism: Secondary | ICD-10-CM | POA: Diagnosis present

## 2022-04-26 DIAGNOSIS — J690 Pneumonitis due to inhalation of food and vomit: Secondary | ICD-10-CM

## 2022-04-26 DIAGNOSIS — J9601 Acute respiratory failure with hypoxia: Secondary | ICD-10-CM | POA: Diagnosis present

## 2022-04-26 DIAGNOSIS — R900 Intracranial space-occupying lesion found on diagnostic imaging of central nervous system: Secondary | ICD-10-CM

## 2022-04-26 DIAGNOSIS — D320 Benign neoplasm of cerebral meninges: Secondary | ICD-10-CM | POA: Diagnosis present

## 2022-04-26 DIAGNOSIS — E440 Moderate protein-calorie malnutrition: Secondary | ICD-10-CM | POA: Diagnosis present

## 2022-04-26 DIAGNOSIS — Z95828 Presence of other vascular implants and grafts: Secondary | ICD-10-CM

## 2022-04-26 DIAGNOSIS — D332 Benign neoplasm of brain, unspecified: Secondary | ICD-10-CM

## 2022-04-26 DIAGNOSIS — E876 Hypokalemia: Secondary | ICD-10-CM | POA: Diagnosis not present

## 2022-04-26 DIAGNOSIS — L8990 Pressure ulcer of unspecified site, unspecified stage: Secondary | ICD-10-CM | POA: Diagnosis present

## 2022-04-26 DIAGNOSIS — D62 Acute posthemorrhagic anemia: Secondary | ICD-10-CM | POA: Diagnosis present

## 2022-04-26 DIAGNOSIS — L89151 Pressure ulcer of sacral region, stage 1: Secondary | ICD-10-CM | POA: Diagnosis present

## 2022-04-26 DIAGNOSIS — Z20822 Contact with and (suspected) exposure to covid-19: Secondary | ICD-10-CM | POA: Diagnosis present

## 2022-04-26 DIAGNOSIS — R509 Fever, unspecified: Secondary | ICD-10-CM

## 2022-04-26 DIAGNOSIS — D329 Benign neoplasm of meninges, unspecified: Secondary | ICD-10-CM | POA: Diagnosis not present

## 2022-04-26 DIAGNOSIS — D688 Other specified coagulation defects: Secondary | ICD-10-CM | POA: Diagnosis present

## 2022-04-26 DIAGNOSIS — G936 Cerebral edema: Secondary | ICD-10-CM | POA: Diagnosis present

## 2022-04-26 DIAGNOSIS — R4182 Altered mental status, unspecified: Secondary | ICD-10-CM | POA: Diagnosis present

## 2022-04-26 DIAGNOSIS — N390 Urinary tract infection, site not specified: Secondary | ICD-10-CM

## 2022-04-26 DIAGNOSIS — A499 Bacterial infection, unspecified: Secondary | ICD-10-CM

## 2022-04-26 DIAGNOSIS — Z79899 Other long term (current) drug therapy: Secondary | ICD-10-CM

## 2022-04-26 DIAGNOSIS — Z803 Family history of malignant neoplasm of breast: Secondary | ICD-10-CM

## 2022-04-26 DIAGNOSIS — E1165 Type 2 diabetes mellitus with hyperglycemia: Secondary | ICD-10-CM | POA: Diagnosis present

## 2022-04-26 DIAGNOSIS — R58 Hemorrhage, not elsewhere classified: Secondary | ICD-10-CM | POA: Diagnosis present

## 2022-04-26 LAB — MAGNESIUM: Magnesium: 1.7 mg/dL (ref 1.7–2.4)

## 2022-04-26 LAB — COMPREHENSIVE METABOLIC PANEL
ALT: 22 U/L (ref 0–44)
AST: 14 U/L — ABNORMAL LOW (ref 15–41)
Albumin: 2.6 g/dL — ABNORMAL LOW (ref 3.5–5.0)
Alkaline Phosphatase: 69 U/L (ref 38–126)
Anion gap: 15 (ref 5–15)
BUN: 24 mg/dL — ABNORMAL HIGH (ref 8–23)
CO2: 19 mmol/L — ABNORMAL LOW (ref 22–32)
Calcium: 9 mg/dL (ref 8.9–10.3)
Chloride: 115 mmol/L — ABNORMAL HIGH (ref 98–111)
Creatinine, Ser: 1 mg/dL (ref 0.44–1.00)
GFR, Estimated: >60 mL/min (ref >60)
Glucose, Bld: 211 mg/dL — ABNORMAL HIGH (ref 70–99)
Potassium: 4 mmol/L (ref 3.5–5.1)
Sodium: 149 mmol/L — ABNORMAL HIGH (ref 135–145)
Total Bilirubin: 1.5 mg/dL — ABNORMAL HIGH (ref 0.3–1.2)
Total Protein: 6.4 g/dL — ABNORMAL LOW (ref 6.5–8.1)

## 2022-04-26 LAB — URINALYSIS, ROUTINE W REFLEX MICROSCOPIC
Glucose, UA: NEGATIVE mg/dL
Ketones, ur: 80 mg/dL — AB
Nitrite: POSITIVE — AB
Protein, ur: 100 mg/dL — AB
RBC / HPF: >50 RBC/hpf — ABNORMAL HIGH (ref 0–5)
Specific Gravity, Urine: 1.029 (ref 1.005–1.030)
pH: 6 (ref 5.0–8.0)

## 2022-04-26 LAB — I-STAT ARTERIAL BLOOD GAS, ED
Acid-base deficit: 2 mmol/L (ref 0.0–2.0)
Bicarbonate: 21.8 mmol/L (ref 20.0–28.0)
Calcium, Ion: 1.23 mmol/L (ref 1.15–1.40)
HCT: 40 % (ref 36.0–46.0)
Hemoglobin: 13.6 g/dL (ref 12.0–15.0)
O2 Saturation: 100 %
Patient temperature: 98.5
Potassium: 2.7 mmol/L — CL (ref 3.5–5.1)
Sodium: 147 mmol/L — ABNORMAL HIGH (ref 135–145)
TCO2: 23 mmol/L (ref 22–32)
pCO2 arterial: 32.6 mmHg (ref 32–48)
pH, Arterial: 7.432 (ref 7.35–7.45)
pO2, Arterial: 290 mmHg — ABNORMAL HIGH (ref 83–108)

## 2022-04-26 LAB — RESP PANEL BY RT-PCR (FLU A&B, COVID) ARPGX2
Influenza A by PCR: NEGATIVE
Influenza B by PCR: NEGATIVE
SARS Coronavirus 2 by RT PCR: NEGATIVE

## 2022-04-26 LAB — I-STAT VENOUS BLOOD GAS, ED
Acid-Base Excess: 0 mmol/L (ref 0.0–2.0)
Bicarbonate: 22.2 mmol/L (ref 20.0–28.0)
Calcium, Ion: 1.03 mmol/L — ABNORMAL LOW (ref 1.15–1.40)
HCT: 49 % — ABNORMAL HIGH (ref 36.0–46.0)
Hemoglobin: 16.7 g/dL — ABNORMAL HIGH (ref 12.0–15.0)
O2 Saturation: 91 %
Potassium: 4 mmol/L (ref 3.5–5.1)
Sodium: 146 mmol/L — ABNORMAL HIGH (ref 135–145)
TCO2: 23 mmol/L (ref 22–32)
pCO2, Ven: 28.5 mmHg — ABNORMAL LOW (ref 44–60)
pH, Ven: 7.498 — ABNORMAL HIGH (ref 7.25–7.43)
pO2, Ven: 54 mmHg — ABNORMAL HIGH (ref 32–45)

## 2022-04-26 LAB — PROTIME-INR
INR: 2.3 — ABNORMAL HIGH (ref 0.8–1.2)
Prothrombin Time: 25.3 seconds — ABNORMAL HIGH (ref 11.4–15.2)

## 2022-04-26 LAB — LACTIC ACID, PLASMA
Lactic Acid, Venous: 2.9 mmol/L (ref 0.5–1.9)
Lactic Acid, Venous: 3 mmol/L (ref 0.5–1.9)

## 2022-04-26 LAB — CBC WITH DIFFERENTIAL/PLATELET
Abs Immature Granulocytes: 0.1 10*3/uL — ABNORMAL HIGH (ref 0.00–0.07)
Basophils Absolute: 0.1 10*3/uL (ref 0.0–0.1)
Basophils Relative: 0 %
Eosinophils Absolute: 0.1 10*3/uL (ref 0.0–0.5)
Eosinophils Relative: 1 %
HCT: 52.5 % — ABNORMAL HIGH (ref 36.0–46.0)
Hemoglobin: 16.8 g/dL — ABNORMAL HIGH (ref 12.0–15.0)
Immature Granulocytes: 0 %
Lymphocytes Relative: 3 %
Lymphs Abs: 0.6 10*3/uL — ABNORMAL LOW (ref 0.7–4.0)
MCH: 29 pg (ref 26.0–34.0)
MCHC: 32 g/dL (ref 30.0–36.0)
MCV: 90.7 fL (ref 80.0–100.0)
Monocytes Absolute: 0.6 10*3/uL (ref 0.1–1.0)
Monocytes Relative: 2 %
Neutro Abs: 21.6 10*3/uL — ABNORMAL HIGH (ref 1.7–7.7)
Neutrophils Relative %: 94 %
Platelets: 430 10*3/uL — ABNORMAL HIGH (ref 150–400)
RBC: 5.79 MIL/uL — ABNORMAL HIGH (ref 3.87–5.11)
RDW: 15.5 % (ref 11.5–15.5)
WBC: 23 10*3/uL — ABNORMAL HIGH (ref 4.0–10.5)
nRBC: 0 % (ref 0.0–0.2)

## 2022-04-26 LAB — GLUCOSE, CAPILLARY: Glucose-Capillary: 256 mg/dL — ABNORMAL HIGH (ref 70–99)

## 2022-04-26 LAB — I-STAT CHEM 8, ED
BUN: 31 mg/dL — ABNORMAL HIGH (ref 8–23)
Calcium, Ion: 1 mmol/L — ABNORMAL LOW (ref 1.15–1.40)
Chloride: 114 mmol/L — ABNORMAL HIGH (ref 98–111)
Creatinine, Ser: 0.5 mg/dL (ref 0.44–1.00)
Glucose, Bld: 211 mg/dL — ABNORMAL HIGH (ref 70–99)
HCT: 50 % — ABNORMAL HIGH (ref 36.0–46.0)
Hemoglobin: 17 g/dL — ABNORMAL HIGH (ref 12.0–15.0)
Potassium: 4 mmol/L (ref 3.5–5.1)
Sodium: 147 mmol/L — ABNORMAL HIGH (ref 135–145)
TCO2: 21 mmol/L — ABNORMAL LOW (ref 22–32)

## 2022-04-26 LAB — BRAIN NATRIURETIC PEPTIDE: B Natriuretic Peptide: 159.2 pg/mL — ABNORMAL HIGH (ref 0.0–100.0)

## 2022-04-26 LAB — CBG MONITORING, ED: Glucose-Capillary: 192 mg/dL — ABNORMAL HIGH (ref 70–99)

## 2022-04-26 LAB — TROPONIN I (HIGH SENSITIVITY)
Troponin I (High Sensitivity): 66 ng/L — ABNORMAL HIGH (ref <18)
Troponin I (High Sensitivity): 86 ng/L — ABNORMAL HIGH (ref <18)

## 2022-04-26 MED ORDER — DOCUSATE SODIUM 50 MG/5ML PO LIQD
100.0000 mg | Freq: Two times a day (BID) | ORAL | Status: DC
  Administered 2022-04-27 – 2022-04-28 (×4): 100 mg
  Filled 2022-04-26 (×5): qty 10

## 2022-04-26 MED ORDER — ACETAMINOPHEN 650 MG RE SUPP
975.0000 mg | Freq: Once | RECTAL | Status: AC
Start: 2022-04-26 — End: 2022-04-26
  Administered 2022-04-26: 975 mg via RECTAL
  Filled 2022-04-26: qty 2

## 2022-04-26 MED ORDER — LACTATED RINGERS IV BOLUS
500.0000 mL | Freq: Once | INTRAVENOUS | Status: AC
  Administered 2022-04-26: 500 mL via INTRAVENOUS

## 2022-04-26 MED ORDER — PANTOPRAZOLE 2 MG/ML SUSPENSION: 40.0000 mg | Freq: Every day | ORAL | Status: DC

## 2022-04-26 MED ORDER — AZTREONAM 2 G IJ SOLR: 2.0000 g | Freq: Once | INTRAMUSCULAR | Status: DC

## 2022-04-26 MED ORDER — LEVOFLOXACIN IN D5W 750 MG/150ML IV SOLN
750.0000 mg | Freq: Once | INTRAVENOUS | Status: AC
  Administered 2022-04-26: 750 mg via INTRAVENOUS
  Filled 2022-04-26: qty 150

## 2022-04-26 MED ORDER — LACTATED RINGERS IV BOLUS
1000.0000 mL | Freq: Once | INTRAVENOUS | Status: AC
  Administered 2022-04-26: 1000 mL via INTRAVENOUS

## 2022-04-26 MED ORDER — METRONIDAZOLE 500 MG/100ML IV SOLN
500.0000 mg | Freq: Two times a day (BID) | INTRAVENOUS | Status: DC
  Administered 2022-04-26 – 2022-04-29 (×6): 500 mg via INTRAVENOUS
  Filled 2022-04-26 (×6): qty 100

## 2022-04-26 MED ORDER — FENTANYL 2500MCG IN NS 250ML (10MCG/ML) PREMIX INFUSION
25.0000 ug/h | INTRAVENOUS | Status: DC
  Administered 2022-04-26: 25 ug/h via INTRAVENOUS
  Filled 2022-04-26: qty 250

## 2022-04-26 MED ORDER — POLYETHYLENE GLYCOL 3350 17 G PO PACK
17.0000 g | PACK | Freq: Every day | ORAL | Status: DC
  Administered 2022-04-27: 17 g
  Filled 2022-04-26 (×2): qty 1

## 2022-04-26 MED ORDER — FENTANYL BOLUS VIA INFUSION: 25.0000 ug | INTRAVENOUS | Status: DC | PRN

## 2022-04-26 MED ORDER — ETOMIDATE 2 MG/ML IV SOLN
INTRAVENOUS | Status: AC | PRN
  Administered 2022-04-26: 20 mg via INTRAVENOUS

## 2022-04-26 MED ORDER — IOHEXOL 300 MG/ML  SOLN
100.0000 mL | Freq: Once | INTRAMUSCULAR | Status: AC | PRN
  Administered 2022-04-26: 100 mL via INTRAVENOUS

## 2022-04-26 MED ORDER — PROPOFOL 1000 MG/100ML IV EMUL
0.0000 ug/kg/min | INTRAVENOUS | Status: DC
  Administered 2022-04-26: 5 ug/kg/min via INTRAVENOUS
  Filled 2022-04-26: qty 100

## 2022-04-26 MED ORDER — FENTANYL CITRATE PF 50 MCG/ML IJ SOSY
25.0000 ug | PREFILLED_SYRINGE | INTRAMUSCULAR | Status: DC | PRN
  Administered 2022-04-27: 50 ug via INTRAVENOUS

## 2022-04-26 MED ORDER — ORAL CARE MOUTH RINSE
15.0000 mL | OROMUCOSAL | Status: DC
  Administered 2022-04-26 – 2022-04-29 (×30): 15 mL via OROMUCOSAL

## 2022-04-26 MED ORDER — ROCURONIUM BROMIDE 50 MG/5ML IV SOLN
INTRAVENOUS | Status: AC | PRN
  Administered 2022-04-26: 70 mg via INTRAVENOUS

## 2022-04-26 MED ORDER — CHLORHEXIDINE GLUCONATE CLOTH 2 % EX PADS
6.0000 | MEDICATED_PAD | Freq: Every day | CUTANEOUS | Status: DC
  Administered 2022-04-26 – 2022-04-29 (×4): 6 via TOPICAL

## 2022-04-26 MED ORDER — FENTANYL CITRATE PF 50 MCG/ML IJ SOSY: 25.0000 ug | PREFILLED_SYRINGE | INTRAMUSCULAR | Status: DC | PRN

## 2022-04-26 MED ORDER — FENTANYL CITRATE PF 50 MCG/ML IJ SOSY
25.0000 ug | PREFILLED_SYRINGE | Freq: Once | INTRAMUSCULAR | Status: AC
  Administered 2022-04-26: 25 ug via INTRAVENOUS

## 2022-04-26 MED ORDER — INSULIN ASPART 100 UNIT/ML IJ SOLN
0.0000 [IU] | INTRAMUSCULAR | Status: DC
  Administered 2022-04-26: 5 [IU] via SUBCUTANEOUS
  Administered 2022-04-27: 2 [IU] via SUBCUTANEOUS
  Administered 2022-04-27: 3 [IU] via SUBCUTANEOUS
  Administered 2022-04-27 – 2022-04-28 (×5): 2 [IU] via SUBCUTANEOUS
  Administered 2022-04-28 – 2022-04-29 (×6): 3 [IU] via SUBCUTANEOUS
  Administered 2022-04-29: 5 [IU] via SUBCUTANEOUS

## 2022-04-26 MED ORDER — VANCOMYCIN HCL 1500 MG/300ML IV SOLN
1500.0000 mg | Freq: Once | INTRAVENOUS | Status: AC
  Administered 2022-04-26: 1500 mg via INTRAVENOUS
  Filled 2022-04-26: qty 300

## 2022-04-26 MED ORDER — ACETAMINOPHEN 325 MG PO TABS: 650.0000 mg | ORAL_TABLET | ORAL | Status: DC | PRN

## 2022-04-26 MED ORDER — METRONIDAZOLE 500 MG/100ML IV SOLN: 500.0000 mg | Freq: Two times a day (BID) | INTRAVENOUS | Status: DC

## 2022-04-26 MED ORDER — VANCOMYCIN HCL IN DEXTROSE 1-5 GM/200ML-% IV SOLN: 1000.0000 mg | INTRAVENOUS | Status: DC

## 2022-04-26 MED ORDER — ORAL CARE MOUTH RINSE: 15.0000 mL | OROMUCOSAL | Status: DC | PRN

## 2022-04-26 NOTE — Code Documentation (Signed)
Pt intubated at 2053 by [facility] w/ 7.5 ET tube.

## 2022-04-26 NOTE — ED Provider Notes (Addendum)
[facility] EMERGENCY DEPARTMENT Provider Note  CSN: [id number] Arrival date & time: 04/26/22 2002  Chief Complaint(s) Altered Mental Status  HPI Jennifer Pacheco is a 66 y.o. female with history of recently diagnosed meningioma with mass effect, pulmonary embolism status post IVC filter, on anticoagulation, conserved due to being found incompetent, recently discharged presenting with altered mental status.  Patient was found at her nursing home to be in respiratory distress, warm to the touch, not reacting.  Patient is reportedly oriented x3 at baseline per EMS.  Patient was hypoxic on EMS arrival placed on nonrebreather.  Reportedly, patient was found to be eating solid food when she is not supposed to be so there was some concern for aspiration from the nursing home.  History otherwise limited by acuity of condition, altered mental status   Past Medical History Past Medical History:  Diagnosis Date   Fall    Hard of hearing    Patient Active Problem List   Diagnosis Date Noted   AKI (acute kidney injury) ([facility]) 02/06/2022   ABLA likely due to retroperitoneal bleed 74/16/3845   Acute metabolic encephalopathy 36/46/8032   Controlled NIDDM-2 02/05/2022   Physical deconditioning 02/05/2022   Esophageal dysphagia    Pressure injury of skin 01/31/2022   Acute pulmonary embolism with acute cor pulmonale ([place]) 01/30/2022   Acute respiratory failure with hypoxia (HCC)    Hypovolemic shock (HCC)    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 Authorizing Provider  apixaban (ELIQUIS) 5 MG TABS tablet Take 1 tablet (5 mg total) by mouth 2 (two) times daily. 04/01/22 05/01/22 Yes Shah, Pratik D, DO  calcium carbonate (TUMS - DOSED IN MG ELEMENTAL CALCIUM) 500 MG chewable tablet Chew 1 tablet (200 mg of elemental calcium total) by mouth 3 (three) times daily as needed for  indigestion or heartburn. 04/01/22  Yes Shah, Pratik D, DO  gabapentin (NEURONTIN) 300 MG capsule Take 1 capsule (300 mg total) by mouth 2 (two) times daily. 04/01/22 05/01/22 Yes Shah, Pratik D, DO  omeprazole (PRILOSEC) 20 MG capsule Take 20 mg by mouth 2 (two) times daily before a meal.   Yes [provider]  potassium chloride SA (KLOR-CON M) 20 MEQ tablet Take 20 mEq by mouth daily.   Yes [provider]  pantoprazole (PROTONIX) 40 MG tablet Take 1 tablet (40 mg total) by mouth 2 (two) times daily. Patient not taking: Reported on 04/26/2022 04/01/22 05/01/22  Heath Lark D, DO                                                                                                                                    Past Surgical History Past Surgical History:  Procedure Laterality Date   BREAST BIOPSY Left 2020   beg   IR IVC FILTER PLMT /  S&I /IMG GUID/MOD SED  02/02/2022   Family History Family History  Problem Relation Age of Onset   Breast cancer Maternal Aunt     Social History Social History   Tobacco Use   Smoking status: Never   Smokeless tobacco: Never  Substance Use Topics   Alcohol use: Not Currently   Drug use: Yes    Types: Marijuana   Allergies Penicillins  Review of Systems Review of Systems  Unable to perform ROS: Acuity of condition    Physical Exam Vital Signs  I have reviewed the triage vital signs BP 118/74   Pulse 100   Temp 99.8 F (37.7 C)   Resp (!) 21   Ht _0  (1.651 m)   Wt 65 kg   SpO2 98%   BMI 23.85 kg/m  Physical Exam Constitutional:      General: She is in acute distress.     Appearance: She is ill-appearing.     Comments: Obtunded  HENT:     Head: Normocephalic and atraumatic.     Right Ear: External ear normal.     Left Ear: External ear normal.     Nose: Nose normal.     Mouth/Throat:     Mouth: Mucous membranes are dry.  Eyes:     Pupils: Pupils are equal, round, and reactive to light.  Cardiovascular:     Rate  and Rhythm: Regular rhythm. Tachycardia present.  Pulmonary:     Comments: Tachypnea, increased work of breathing, diffuse rhonchi Abdominal:     General: Abdomen is flat.     Palpations: Abdomen is soft.     Tenderness: There is no abdominal tenderness.  Musculoskeletal:     Right lower leg: No edema.     Left lower leg: No edema.  Skin:    General: Skin is warm and dry.     Capillary Refill: Capillary refill takes less than 2 seconds.  Neurological:     Comments: Obtunded, does not open eyes spontaneously or to voice, localizes pain on the right side, no spontaneous movement of the left upper or lower extremities  Psychiatric:     Comments: Unable to assess     ED Results and Treatments Labs (all labs ordered are listed, but only abnormal results are displayed) Labs Reviewed  COMPREHENSIVE METABOLIC PANEL - Abnormal; Notable for the following components:      Result Value   Sodium 149 (*)    Chloride 115 (*)    CO2 19 (*)    Glucose, Bld 211 (*)    BUN 24 (*)    Total Protein 6.4 (*)    Albumin 2.6 (*)    AST 14 (*)    Total Bilirubin 1.5 (*)    All other components within normal limits  CBC WITH DIFFERENTIAL/PLATELET - Abnormal; Notable for the following components:   WBC 23.0 (*)    RBC 5.79 (*)    Hemoglobin 16.8 (*)    HCT 52.5 (*)    Platelets 430 (*)    Neutro Abs 21.6 (*)    Lymphs Abs 0.6 (*)    Abs Immature Granulocytes 0.10 (*)    All other components within normal limits  PROTIME-INR - Abnormal; Notable for the following components:   Prothrombin Time 25.3 (*)    INR 2.3 (*)    All other components within normal limits  LACTIC ACID, PLASMA - Abnormal; Notable for the following components:   Lactic Acid, Venous 2.9 (*)  All other components within normal limits  BRAIN NATRIURETIC PEPTIDE - Abnormal; Notable for the following components:   B Natriuretic Peptide 159.2 (*)    All other components within normal limits  URINALYSIS, ROUTINE W REFLEX  MICROSCOPIC - Abnormal; Notable for the following components:   Color, Urine AMBER (*)    APPearance CLOUDY (*)    Hgb urine dipstick MODERATE (*)    Bilirubin Urine SMALL (*)    Ketones, ur 80 (*)    Protein, ur 100 (*)    Nitrite POSITIVE (*)    Leukocytes,Ua TRACE (*)    RBC / HPF >50 (*)    Bacteria, UA MANY (*)    All other components within normal limits  CBG MONITORING, ED - Abnormal; Notable for the following components:   Glucose-Capillary 192 (*)    All other components within normal limits  I-STAT CHEM 8, ED - Abnormal; Notable for the following components:   Sodium 147 (*)    Chloride 114 (*)    BUN 31 (*)    Glucose, Bld 211 (*)    Calcium, Ion 1.00 (*)    TCO2 21 (*)    Hemoglobin 17.0 (*)    HCT 50.0 (*)    All other components within normal limits  I-STAT VENOUS BLOOD GAS, ED - Abnormal; Notable for the following components:   pH, Ven 7.498 (*)    pCO2, Ven 28.5 (*)    pO2, Ven 54 (*)    Sodium 146 (*)    Calcium, Ion 1.03 (*)    HCT 49.0 (*)    Hemoglobin 16.7 (*)    All other components within normal limits  I-STAT ARTERIAL BLOOD GAS, ED - Abnormal; Notable for the following components:   pO2, Arterial 290 (*)    Sodium 147 (*)    Potassium 2.7 (*)    All other components within normal limits  TROPONIN I (HIGH SENSITIVITY) - Abnormal; Notable for the following components:   Troponin I (High Sensitivity) 66 (*)    All other components within normal limits  RESP PANEL BY RT-PCR (FLU A&B, COVID) ARPGX2  CULTURE, BLOOD (ROUTINE X 2)  CULTURE, BLOOD (ROUTINE X 2)  MRSA NEXT GEN BY PCR, NASAL  URINE CULTURE  CULTURE, RESPIRATORY W GRAM STAIN  TRIGLYCERIDES  RAPID URINE DRUG SCREEN, HOSP PERFORMED  MAGNESIUM  LACTIC ACID, PLASMA  LACTIC ACID, PLASMA  PROCALCITONIN  CBC  BASIC METABOLIC PANEL  AMMONIA  ETHANOL  TSH  TROPONIN I (HIGH SENSITIVITY)                                                                                                                           Radiology CT Angio Chest PE W and/or Wo Contrast  Result Date: 04/26/2022 CLINICAL DATA:  Altered mental status, tachycardia, fever EXAM: CT ANGIOGRAPHY CHEST CT ABDOMEN AND PELVIS WITH CONTRAST TECHNIQUE: Multidetector CT imaging of the chest was performed using the standard protocol during bolus administration of  intravenous contrast. Multiplanar CT image reconstructions and MIPs were obtained to evaluate the vascular anatomy. Multidetector CT imaging of the abdomen and pelvis was performed using the standard protocol during bolus administration of intravenous contrast. RADIATION DOSE REDUCTION: This exam was performed according to the departmental dose-optimization program which includes automated exposure control, adjustment of the mA and/or kV according to patient size and/or use of iterative reconstruction technique. CONTRAST:  164m OMNIPAQUE IOHEXOL 300 MG/ML  SOLN COMPARISON:  CTA abdomen/pelvis dated 02/03/2022. CTA chest dated 01/30/2022. FINDINGS: CTA CHEST FINDINGS Cardiovascular: Satisfactory opacification of the bilateral pulmonary arteries to the segmental level. No evidence of pulmonary embolism. Ascending thoracic aorta measures 3.9 cm, at the upper limits of normal. No evidence of thoracic aortic aneurysm or dissection. The heart is normal in size.  No pericardial effusion. Mediastinum/Nodes: No suspicious mediastinal lymphadenopathy. Visualized thyroid is unremarkable. Lungs/Pleura: Endotracheal tube terminates 4 cm above the carina. Lobar left lower lobe opacity. Patchy medial right lower lobe opacity. Additional minimal opacity in the posterior right upper and central right middle lobes. This appearance suggests multifocal pneumonia, possibly on the basis of aspiration. No pleural effusion or pneumothorax. Musculoskeletal: Mild degenerative changes of the mid thoracic spine. Review of the MIP images confirms the above findings. CT ABDOMEN and PELVIS FINDINGS Hepatobiliary:  Liver is within normal limits. Gallbladder is notable for a subcentimeter layering gallstone (series 4/image 29), without associated inflammatory changes. No intrahepatic or extrahepatic duct dilatation. Pancreas: Within normal limits. Spleen: Within normal limits. Adrenals/Urinary Tract: Adrenal glands are within normal limits. Right kidney is within normal limits. Two nonobstructing left upper pole renal calculi measuring up to 6 mm. Additional 3 mm calculus in the left renal collecting system. No hydronephrosis. Multiple layering bladder calculi.  Indwelling Foley catheter. Stomach/Bowel: Enteric tube terminates in the proximal gastric body. No evidence of bowel obstruction. Normal appendix (series 4/image 85). Sigmoid diverticulosis, without evidence of diverticulitis. Vascular/Lymphatic: No evidence of abdominal aortic aneurysm. Atherosclerotic calcifications of the abdominal aorta and branch vessels. IVC filter. No suspicious abdominopelvic lymphadenopathy. Reproductive: Uterus and bilateral ovaries are within normal limits. Other: Large left retroperitoneal fluid collection with well-defined rim now measures 10.7 x 9.9 x 14.6 cm, corresponding to the prior larger left retroperitoneal hematoma, improved. No abdominopelvic ascites. Musculoskeletal: Mild degenerative changes of the visualized thoracolumbar spine. Review of the MIP images confirms the above findings. IMPRESSION: No evidence of pulmonary embolism. Multifocal pneumonia, left lower lobe predominant, possibly on the basis of aspiration. Large left retroperitoneal hematoma, improved. Additional ancillary findings as above. Electronically Signed   By: SJulian HyM.D.   On: 04/26/2022 23:19   CT ABDOMEN PELVIS W CONTRAST  Result Date: 04/26/2022 CLINICAL DATA:  Altered mental status, tachycardia, fever EXAM: CT ANGIOGRAPHY CHEST CT ABDOMEN AND PELVIS WITH CONTRAST TECHNIQUE: Multidetector CT imaging of the chest was performed using the  standard protocol during bolus administration of intravenous contrast. Multiplanar CT image reconstructions and MIPs were obtained to evaluate the vascular anatomy. Multidetector CT imaging of the abdomen and pelvis was performed using the standard protocol during bolus administration of intravenous contrast. RADIATION DOSE REDUCTION: This exam was performed according to the departmental dose-optimization program which includes automated exposure control, adjustment of the mA and/or kV according to patient size and/or use of iterative reconstruction technique. CONTRAST:  1022mOMNIPAQUE IOHEXOL 300 MG/ML  SOLN COMPARISON:  CTA abdomen/pelvis dated 02/03/2022. CTA chest dated 01/30/2022. FINDINGS: CTA CHEST FINDINGS Cardiovascular: Satisfactory opacification of the bilateral pulmonary arteries to the segmental level. No evidence  of pulmonary embolism. Ascending thoracic aorta measures 3.9 cm, at the upper limits of normal. No evidence of thoracic aortic aneurysm or dissection. The heart is normal in size.  No pericardial effusion. Mediastinum/Nodes: No suspicious mediastinal lymphadenopathy. Visualized thyroid is unremarkable. Lungs/Pleura: Endotracheal tube terminates 4 cm above the carina. Lobar left lower lobe opacity. Patchy medial right lower lobe opacity. Additional minimal opacity in the posterior right upper and central right middle lobes. This appearance suggests multifocal pneumonia, possibly on the basis of aspiration. No pleural effusion or pneumothorax. Musculoskeletal: Mild degenerative changes of the mid thoracic spine. Review of the MIP images confirms the above findings. CT ABDOMEN and PELVIS FINDINGS Hepatobiliary: Liver is within normal limits. Gallbladder is notable for a subcentimeter layering gallstone (series 4/image 29), without associated inflammatory changes. No intrahepatic or extrahepatic duct dilatation. Pancreas: Within normal limits. Spleen: Within normal limits. Adrenals/Urinary Tract:  Adrenal glands are within normal limits. Right kidney is within normal limits. Two nonobstructing left upper pole renal calculi measuring up to 6 mm. Additional 3 mm calculus in the left renal collecting system. No hydronephrosis. Multiple layering bladder calculi.  Indwelling Foley catheter. Stomach/Bowel: Enteric tube terminates in the proximal gastric body. No evidence of bowel obstruction. Normal appendix (series 4/image 85). Sigmoid diverticulosis, without evidence of diverticulitis. Vascular/Lymphatic: No evidence of abdominal aortic aneurysm. Atherosclerotic calcifications of the abdominal aorta and branch vessels. IVC filter. No suspicious abdominopelvic lymphadenopathy. Reproductive: Uterus and bilateral ovaries are within normal limits. Other: Large left retroperitoneal fluid collection with well-defined rim now measures 10.7 x 9.9 x 14.6 cm, corresponding to the prior larger left retroperitoneal hematoma, improved. No abdominopelvic ascites. Musculoskeletal: Mild degenerative changes of the visualized thoracolumbar spine. Review of the MIP images confirms the above findings. IMPRESSION: No evidence of pulmonary embolism. Multifocal pneumonia, left lower lobe predominant, possibly on the basis of aspiration. Large left retroperitoneal hematoma, improved. Additional ancillary findings as above. Electronically Signed   By: Julian Hy M.D.   On: 04/26/2022 23:19   CT Head Wo Contrast  Result Date: 04/26/2022 CLINICAL DATA:  Altered mental status, history of large right-sided meningioma EXAM: CT HEAD WITHOUT CONTRAST TECHNIQUE: Contiguous axial images were obtained from the base of the skull through the vertex without intravenous contrast. RADIATION DOSE REDUCTION: This exam was performed according to the departmental dose-optimization program which includes automated exposure control, adjustment of the mA and/or kV according to patient size and/or use of iterative reconstruction technique.  COMPARISON:  02/20/2022 FINDINGS: Brain: Bilobed hyperdense mass lesion is noted on the right consistent with the known history of meningioma. This measures approximately 5.3 x 5.3 cm similar to that noted on prior CT and MRI. Surrounding vasogenic edema is again identified and relatively stable when compare with the prior exam. Midline shift is seen of approximately 11 mm stable in appearance from the prior exam. Chronic white matter ischemic changes are noted and stable. No acute hemorrhage or acute infarct is seen. Vascular: No hyperdense vessel or unexpected calcification. Skull: Normal. Negative for fracture or focal lesion. Sinuses/Orbits: No acute finding. Other: None. IMPRESSION: Stable appearing large right meningioma involving the right frontotemporal region with significant midline shift. No new focal abnormality is noted. Electronically Signed   By: Inez Catalina M.D.   On: 04/26/2022 23:00   DG Chest Portable 1 View  Result Date: 04/26/2022 CLINICAL DATA:  Endotracheal tube and OG tube placement. EXAM: PORTABLE CHEST 1 VIEW COMPARISON:  Radiograph earlier today FINDINGS: Nine an endotracheal tube with tip 5 cm above  the carina and OG tube with tip overlying the proximal to mid stomach noted. Bilateral LOWER lung consolidation/atelectasis, LEFT greater than RIGHT, again noted. There is no evidence of pneumothorax. No other changes identified. IMPRESSION: 1. Endotracheal tube and OG tube placement as described. 2. Unchanged bilateral LOWER lung consolidation/atelectasis, LEFT greater than RIGHT. Electronically Signed   By: Margarette Canada M.D.   On: 04/26/2022 21:35   DG Chest 1 View  Result Date: 04/26/2022 CLINICAL DATA:  Altered mental status and hypoxia. EXAM: CHEST  1 VIEW COMPARISON:  01/30/2022 FINDINGS: The cardiomediastinal silhouette is unremarkable. Mild bibasilar opacities are noted and may represent atelectasis versus airspace disease/pneumonia. The lungs are otherwise clear. There is no  evidence of pneumothorax or large pleural effusion. No acute bony abnormalities are present. IMPRESSION: Mild bibasilar opacities - question atelectasis versus airspace disease/pneumonia. Electronically Signed   By: Margarette Canada M.D.   On: 04/26/2022 20:32    Pertinent labs & imaging results that were available during my care of the patient were reviewed by me and considered in my medical decision making (see MDM for details).  Medications Ordered in ED Medications  vancomycin (VANCOREADY) IVPB 1500 mg/300 mL (1,500 mg Intravenous New Bag/Given 04/26/22 2254)  metroNIDAZOLE (FLAGYL) IVPB 500 mg (500 mg Intravenous New Bag/Given 04/26/22 2252)  vancomycin (VANCOCIN) IVPB 1000 mg/200 mL premix (has no administration in time range)  pantoprazole (PROTONIX) 2 mg/mL oral suspension 40 mg (has no administration in time range)  acetaminophen (TYLENOL) tablet 650 mg (has no administration in time range)  docusate (COLACE) 50 MG/5ML liquid 100 mg (has no administration in time range)  polyethylene glycol (MIRALAX / GLYCOLAX) packet 17 g (has no administration in time range)  insulin aspart (novoLOG) injection 0-9 Units (has no administration in time range)  fentaNYL (SUBLIMAZE) injection 25 mcg (has no administration in time range)  fentaNYL (SUBLIMAZE) injection 25-100 mcg (has no administration in time range)  Chlorhexidine Gluconate Cloth 2 % PADS 6 each (has no administration in time range)  Oral care mouth rinse (has no administration in time range)  Oral care mouth rinse (has no administration in time range)  acetaminophen (TYLENOL) suppository 975 mg (975 mg Rectal Given 04/26/22 2046)  lactated ringers bolus 1,000 mL (0 mLs Intravenous Stopped 04/26/22 2149)  levofloxacin (LEVAQUIN) IVPB 750 mg (0 mg Intravenous Stopped 04/26/22 2247)  etomidate (AMIDATE) injection (20 mg Intravenous Given 04/26/22 2051)  rocuronium (ZEMURON) injection (70 mg Intravenous Given 04/26/22 2052)  fentaNYL (SUBLIMAZE)  injection 25 mcg (25 mcg Intravenous Given 04/26/22 2123)  lactated ringers bolus 500 mL ( Intravenous Stopped 04/26/22 2225)  iohexol (OMNIPAQUE) 300 MG/ML solution 100 mL (100 mLs Intravenous Contrast Given 04/26/22 2245)                                                                                                                                     Procedures .Critical Care  Performed by:  Cristie Hem, MD Authorized by: Cristie Hem, MD   Critical care provider statement:    Critical care time (minutes):  30   Critical care time was exclusive of:  Separately billable procedures and treating other patients   Critical care was necessary to treat or prevent imminent or life-threatening deterioration of the following conditions:  Respiratory failure, sepsis, shock, cardiac failure, circulatory failure, dehydration and CNS failure or compromise   Critical care was time spent personally by me on the following activities:  Development of treatment plan with patient or surrogate, discussions with consultants, evaluation of patient's response to treatment, examination of patient, ordering and review of laboratory studies, ordering and review of radiographic studies, ordering and performing treatments and interventions, pulse oximetry, re-evaluation of patient's condition and review of old charts   Care discussed with: admitting provider   Procedure Name: Intubation Date/Time: 04/26/2022 10:20 PM  Performed by: Cristie Hem, MDPre-anesthesia Checklist: Patient identified, Patient being monitored, Timeout performed, Emergency Drugs available and Suction available Oxygen Delivery Method: Non-rebreather mask Preoxygenation: Pre-oxygenation with 100% oxygen Induction Type: Rapid sequence Laryngoscope Size: Mac and 3 Grade View: Grade I Tube size: 7.5 mm Number of attempts: 1 Airway Equipment and Method: Rigid stylet and Video-laryngoscopy Placement Confirmation: ETT inserted  through vocal cords under direct vision, Positive ETCO2, CO2 detector and Breath sounds checked- equal and bilateral Secured at: 23 cm Tube secured with: ETT holder Dental Injury: Teeth and Oropharynx as per pre-operative assessment       (including critical care time)  Medical Decision Making / ED Course   MDM:  66 year old female presenting to the emergency department with altered mental status.  Vital signs notable for tachycardia, tachypnea, fever.  Patient with poor mental status, not protecting airway.  Patient intubated for airway protection  Differential includes toxic or metabolic encephalopathy in the setting of sepsis, intracranial process such as worsening of tumor or intracranial hemorrhage, electrolyte abnormality.  Urinalysis grossly positive suggestive of urinary infection.  Antibiotics started after blood cultures have been obtained.  Chest x-ray interpreted by me, some bibasilar atelectasis versus early pneumonia.  Given history, will obtain CT scan of the head, chest, and significant vital sign abnormalities will obtain CT abdomen.  Clinical Course as of 04/26/22 2329  Nancy Fetter Apr 26, 2022  2138 Discussed with intensivist, who will see patient and admit. Pending CT scans.  [WS]    Clinical Course User Index [WS] Cristie Hem, MD     Additional history obtained: -Additional history obtained from ems -External records from outside source obtained and reviewed including: Chart review including previous notes, labs, imaging, consultation notes   Lab Tests: -I ordered, reviewed, and interpreted labs.   The pertinent results include:   Labs Reviewed  COMPREHENSIVE METABOLIC PANEL - Abnormal; Notable for the following components:      Result Value   Sodium 149 (*)    Chloride 115 (*)    CO2 19 (*)    Glucose, Bld 211 (*)    BUN 24 (*)    Total Protein 6.4 (*)    Albumin 2.6 (*)    AST 14 (*)    Total Bilirubin 1.5 (*)    All other components within  normal limits  CBC WITH DIFFERENTIAL/PLATELET - Abnormal; Notable for the following components:   WBC 23.0 (*)    RBC 5.79 (*)    Hemoglobin 16.8 (*)    HCT 52.5 (*)    Platelets 430 (*)    Neutro  Abs 21.6 (*)    Lymphs Abs 0.6 (*)    Abs Immature Granulocytes 0.10 (*)    All other components within normal limits  PROTIME-INR - Abnormal; Notable for the following components:   Prothrombin Time 25.3 (*)    INR 2.3 (*)    All other components within normal limits  LACTIC ACID, PLASMA - Abnormal; Notable for the following components:   Lactic Acid, Venous 2.9 (*)    All other components within normal limits  BRAIN NATRIURETIC PEPTIDE - Abnormal; Notable for the following components:   B Natriuretic Peptide 159.2 (*)    All other components within normal limits  URINALYSIS, ROUTINE W REFLEX MICROSCOPIC - Abnormal; Notable for the following components:   Color, Urine AMBER (*)    APPearance CLOUDY (*)    Hgb urine dipstick MODERATE (*)    Bilirubin Urine SMALL (*)    Ketones, ur 80 (*)    Protein, ur 100 (*)    Nitrite POSITIVE (*)    Leukocytes,Ua TRACE (*)    RBC / HPF >50 (*)    Bacteria, UA MANY (*)    All other components within normal limits  CBG MONITORING, ED - Abnormal; Notable for the following components:   Glucose-Capillary 192 (*)    All other components within normal limits  I-STAT CHEM 8, ED - Abnormal; Notable for the following components:   Sodium 147 (*)    Chloride 114 (*)    BUN 31 (*)    Glucose, Bld 211 (*)    Calcium, Ion 1.00 (*)    TCO2 21 (*)    Hemoglobin 17.0 (*)    HCT 50.0 (*)    All other components within normal limits  I-STAT VENOUS BLOOD GAS, ED - Abnormal; Notable for the following components:   pH, Ven 7.498 (*)    pCO2, Ven 28.5 (*)    pO2, Ven 54 (*)    Sodium 146 (*)    Calcium, Ion 1.03 (*)    HCT 49.0 (*)    Hemoglobin 16.7 (*)    All other components within normal limits  I-STAT ARTERIAL BLOOD GAS, ED - Abnormal; Notable for  the following components:   pO2, Arterial 290 (*)    Sodium 147 (*)    Potassium 2.7 (*)    All other components within normal limits  TROPONIN I (HIGH SENSITIVITY) - Abnormal; Notable for the following components:   Troponin I (High Sensitivity) 66 (*)    All other components within normal limits  RESP PANEL BY RT-PCR (FLU A&B, COVID) ARPGX2  CULTURE, BLOOD (ROUTINE X 2)  CULTURE, BLOOD (ROUTINE X 2)  MRSA NEXT GEN BY PCR, NASAL  URINE CULTURE  CULTURE, RESPIRATORY W GRAM STAIN  TRIGLYCERIDES  RAPID URINE DRUG SCREEN, HOSP PERFORMED  MAGNESIUM  LACTIC ACID, PLASMA  LACTIC ACID, PLASMA  PROCALCITONIN  CBC  BASIC METABOLIC PANEL  AMMONIA  ETHANOL  TSH  TROPONIN I (HIGH SENSITIVITY)      EKG   EKG Interpretation  Date/Time:  Sunday April 26 2022 20:08:43 EDT Ventricular Rate:  126 PR Interval:  140 QRS Duration: 67 QT Interval:  285 QTC Calculation: 413 R Axis:   -21 Text Interpretation: Sinus tachycardia Ventricular premature complex Consider right atrial enlargement Borderline left axis deviation Low voltage, precordial leads Consider right ventricular hypertrophy Nonspecific T abnormalities, lateral leads Confirmed by Garnette Gunner [phone]) on 04/26/2022 8:19:15 PM         Imaging Studies ordered: I ordered  imaging studies including CT head, CT chest abdomen and pelvis, chest x-ray On my interpretation imaging demonstrates multifocal pneumonia, meningioma tumor appears stable from prior I independently visualized and interpreted imaging. I agree with the radiologist interpretation   Medicines ordered and prescription drug management: Meds ordered this encounter  Medications   acetaminophen (TYLENOL) suppository 975 mg   lactated ringers bolus 1,000 mL   levofloxacin (LEVAQUIN) IVPB 750 mg    Order Specific Question:   Antibiotic Indication:    Answer:   CAP   vancomycin (VANCOREADY) IVPB 1500 mg/300 mL    Order Specific Question:   Indication:     Answer:   Sepsis   DISCONTD: aztreonam (AZACTAM) 2 g in sodium chloride 0.9 % 100 mL IVPB    Order Specific Question:   Antibiotic Indication:    Answer:   Sepsis   DISCONTD: metroNIDAZOLE (FLAGYL) IVPB 500 mg    Order Specific Question:   Antibiotic Indication:    Answer:   Intra-abdominal Infection   metroNIDAZOLE (FLAGYL) IVPB 500 mg    Order Specific Question:   Antibiotic Indication:    Answer:   Intra-abdominal Infection   etomidate (AMIDATE) injection   rocuronium (ZEMURON) injection   fentaNYL (SUBLIMAZE) injection 25 mcg   DISCONTD: fentaNYL 2519mg in NS 256m(1028mml) infusion-PREMIX   DISCONTD: fentaNYL (SUBLIMAZE) bolus via infusion 25-100 mcg   DISCONTD: propofol (DIPRIVAN) 1000 MG/100ML infusion   vancomycin (VANCOCIN) IVPB 1000 mg/200 mL premix    Order Specific Question:   Indication:    Answer:   Pneumonia   lactated ringers bolus 500 mL   iohexol (OMNIPAQUE) 300 MG/ML solution 100 mL   pantoprazole (PROTONIX) 2 mg/mL oral suspension 40 mg   acetaminophen (TYLENOL) tablet 650 mg   docusate (COLACE) 50 MG/5ML liquid 100 mg   polyethylene glycol (MIRALAX / GLYCOLAX) packet 17 g   insulin aspart (novoLOG) injection 0-9 Units    Order Specific Question:   Correction coverage:    Answer:   Sensitive (thin, NPO, renal)    Order Specific Question:   CBG < 70:    Answer:   Implement Hypoglycemia Standing Orders and refer to Hypoglycemia Standing Orders sidebar report    Order Specific Question:   CBG 70 - 120:    Answer:   0 units    Order Specific Question:   CBG 121 - 150:    Answer:   1 unit    Order Specific Question:   CBG 151 - 200:    Answer:   2 units    Order Specific Question:   CBG 201 - 250:    Answer:   3 units    Order Specific Question:   CBG 251 - 300:    Answer:   5 units    Order Specific Question:   CBG 301 - 350:    Answer:   7 units    Order Specific Question:   CBG 351 - 400    Answer:   9 units    Order Specific Question:   CBG > 400     Answer:   call MD and obtain STAT lab verification   fentaNYL (SUBLIMAZE) injection 25 mcg   fentaNYL (SUBLIMAZE) injection 25-100 mcg   Chlorhexidine Gluconate Cloth 2 % PADS 6 each   Oral care mouth rinse   Oral care mouth rinse    -I have reviewed the patients home medicines and have made adjustments as needed   Consultations Obtained: I requested consultation  with the intensivist,  and discussed lab and imaging findings as well as pertinent plan - they recommend: Admission   Cardiac Monitoring: The patient was maintained on a cardiac monitor.  I personally viewed and interpreted the cardiac monitored which showed an underlying rhythm of: Sinus tachycardia  Social Determinants of Health:  Factors impacting patients care include: conserved patient   Reevaluation: After the interventions noted above, I reevaluated the patient and found that they have improved  Co morbidities that complicate the patient evaluation  Past Medical History:  Diagnosis Date   Fall    Hard of hearing       Dispostion: Admit    Final Clinical Impression(s) / ED Diagnoses Final diagnoses:  Altered mental status, unspecified altered mental status type  Sepsis, due to unspecified organism, unspecified whether acute organ dysfunction present ([place])  Fever, unspecified fever cause  Intracranial mass  Bacterial urinary infection     This chart was dictated using voice recognition software.  Despite best efforts to proofread,  errors can occur which can change the documentation meaning.    Cristie Hem, MD 04/26/22 2329    Cristie Hem, MD 04/26/22 2330

## 2022-04-26 NOTE — Progress Notes (Signed)
Placed pt. On HFNC per MD. 

## 2022-04-26 NOTE — ED Notes (Signed)
RT called to intubate pt

## 2022-04-26 NOTE — Progress Notes (Signed)
Pharmacy Antibiotic Note  Jennifer Pacheco is a 66 y.o. female for which pharmacy has been consulted for vancomycin dosing for pneumonia.  SCr 1 - up from 0.69 end of July WBC 23; LA 2.9; T 101.8; HR 110; RR 18  Plan: Levaquin per MD Metronidazole per MD Vancomycin 1500 mg once then 1000 mg q24hr (eAUC 461.6) unless change in renal function Trend WBC, Fever, Renal function, & Clinical course F/u cultures, clinical course, WBC, fever De-escalate when able Levels at steady state F/u MRSA PCR  Height: '5\' 5"'$  (165.1 cm) Weight: 65 kg (143 lb 4.8 oz) IBW/kg (Calculated) : 57  Temp (24hrs), Avg:101.9 F (38.8 C), Min:101.9 F (38.8 C), Max:101.9 F (38.8 C)  Recent Labs  Lab 04/26/22 2019  CREATININE 0.50    Estimated Creatinine Clearance: 63.1 mL/min (by C-G formula based on SCr of 0.5 mg/dL).    Allergies  Allergen Reactions   Penicillins Anaphylaxis    Antimicrobials this admission: levaquin 8/27 >>  flagyl 8/27 >>  vancomycin 8/27 >>   Microbiology results: Pending  Thank you for allowing pharmacy to be a part of this patient's care.  Lorelei Pont, PharmD, BCPS 04/26/2022 8:27 PM ED Clinical Pharmacist -  [phone]

## 2022-04-26 NOTE — H&P (Signed)
NAME:  Jennifer Pacheco, MRN:  [id number], DOB:  1955/11/01, LOS: 0 ADMISSION DATE:  04/26/2022, CONSULTATION DATE:  8/27 REFERRING MD:  Dr. Truett Mainland, CHIEF COMPLAINT:  acute resp failure   History of Present Illness:  Patient is a 66 year old female with pertinent PMH of HTN, DMT2, brain tumor (meningioma) with mass effect and vasogenic edema, distal esophageal thickening, recent PE with IVC filter placement on Eliquis presents to [facility] ED on 8/27 with AMS.  Patient was recently admitted to [facility] ED 01/30/2022 and transferred to [facility] with AMS.  Patient was hypoxic/hypotensive.  Patient intubated and started on pressors.  CT chest showing submassive PE and was started on heparin.  Broad-spectrum antibiotics started.  CT abdomen and UA concerning for UTI with possible pyelonephritis.  CT head showing large mass in R basal ganglia with surrounding edema; mass effect with near complete effacement of R lateral ventricle and an 11 mm leftward shift.  NSG was consulted and started patient on Decadron.  Throughout stay patient experienced ABLA due to retroperitoneal bleed.  Heparin was stopped and IVC filter placed.  Patient then developed DVT in LLE on 7/5.  Vascular rechallenge patient with IV heparin and transition patient to Eliquis without blood loss. APS involved for guardianship and [place] was appointed interim guardian.  Palliative care was following. Patient was discharged on 04/01/2022 to [facility] [place].  Patient was to follow-up with NSG in 1 month and follow-up with GI for possible endoscopy Per NSG patient was weaned off dexamethasone 1 week after discharge.  On 8/27 staff at [facility] state patient was having respite tori distress and AMS.  Concern patient was eating and should not be with dysphagia. Patient transported to [facility] ED for further eval.  Upon arrival to [facility] ED on 8/27, patient hypoxic with improvement on NRB.  Vital stable.  GCS 6.  ED physician intubated due to  hypoxia and low GCS.  Tmax 102 F and WBC 23.  UA indicative of UTI and CXR showing small lower lobe consolidation L>R. Started on Levaquin, Flagyl, bank (anaphylaxis to penicillin).  Glucose 211.  ABG WNL postintubation.  LA 2.9.  NA 149.  Troponin 66 and BNP 159.  CT head, ABD/pelvis, PE chest pending.  Other labs pending.  PCCM consulted for ICU admission.  Pertinent  Medical History   HTN, DMT2, brain tumor (meningioma) with mass effect and vasogenic edema, distal esophageal thickening, recent PE with IVC filter placement on Eliquis  Significant Hospital Events: Including procedures, antibiotic start and stop dates in addition to other pertinent events   8/27 admitted to [facility]; intubated  Interim History / Subjective:  Intubated on vent On prop/fent no response to painful stimuli; PERRL; weak cough/gag Vital stable  Objective   Blood pressure 138/82, pulse (!) 110, temperature (!) 101.8 F (38.8 C), resp. rate 18, height '5\' 5"'$  (1.651 m), weight 65 kg, SpO2 100 %.    Vent Mode: PRVC FiO2 (%):  [100 %] 100 % Set Rate:  [18 bmp] 18 bmp Vt Set:  [450 mL] 450 mL PEEP:  [5 cmH20] 5 cmH20 Plateau Pressure:  [15 cmH20] 15 cmH20  No intake or output data in the 24 hours ending 04/26/22 2143 Filed Weights   04/26/22 2018  Weight: 65 kg    Examination: General:  critically ill appearing on mech vent HEENT: MM pink/moist; ETT in place Neuro: On prop/fent no response to painful stimuli; PERRL; weak cough/gag CV: s1s2, no m/r/g PULM:  dim rhonchi BS  bilaterally; on mech vent PRVC GI: soft, bsx4 active  Extremities: warm/dry, trace BLE edema  Skin: no rashes or lesions appreciated  Resolved Hospital Problem list     Assessment & Plan:  Acute respiratory failure with hypoxia Possible aspiration pneumonia P: -LTVV strategy with tidal volumes of 6-8 cc/kg ideal body weight -Wean PEEP/FiO2 for SpO2 >92% -VAP bundle in place -Daily SAT and SBT -PAD protocol in place -wean sedation  for RASS goal 0 to -1 -broad spectrum abx -send tracheal aspirate  Acute encephalopathy: UA positive; CT head pending Hx of meningioma with mass effect and vasogenic edema: weaned off decadron recently -8/27 CT head stable appearing Large R meningioma in frontotemproal region w/ significant midline shift; no new focal abnormality P: -continue broad-spectrum antibiotics -Check PCT -Check ammonia, ethanol, UDS, TSH -Limit sedating meds -As needed fentanyl for RASS 0 to -1 -f/u outpt with NSG  Sepsis: recently admitted 01/2022 with UTI w/ pyelonephritis UTI Possible aspiration pneumonia P: -Check UC, BC x2, trach aspirate -Continue levaquin, flagyl, vanc (pcn allergy) -continue IV fluid -Trend LA and troponin -Trend WBC/fever curve  Hyperglycemia DMT2 P: -SSI and CBG monitoring  Hypernatremia P: -likely hypovolemia -continue iv fluids -trend bmp  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 -will consult palliative  Best Practice (right click and "Reselect all SmartList Selections" daily)   Diet/type: NPO w/ meds via tube DVT prophylaxis: systemic heparin  GI prophylaxis: PPI Lines: N/A Foley:  Yes, and it is still needed Code Status:  full code Last date of multidisciplinary goals of care discussion [attempted to reach sister Bethena Roys over phone but no answer]  Labs   CBC: Recent Labs  Lab 04/26/22 2019 04/26/22 2025  WBC  --  23.0*  NEUTROABS  --  21.6*  HGB 16.7*  17.0* 16.8*  HCT 49.0*  50.0* 52.5*  MCV  --   90.7  PLT  --  430*    Basic Metabolic Panel: Recent Labs  Lab 04/26/22 2019 04/26/22 2025  NA 146*  147* 149*  K 4.0  4.0 4.0  CL 114* 115*  CO2  --  19*  GLUCOSE 211* 211*  BUN 31* 24*  CREATININE 0.50 1.00  CALCIUM  --  9.0   GFR: Estimated Creatinine Clearance: 50.5 mL/min (by C-G formula based on SCr of 1 mg/dL). Recent Labs  Lab 04/26/22 2024 04/26/22 2025  WBC  --  23.0*  LATICACIDVEN 2.9*  --     Liver Function Tests: Recent Labs  Lab 04/26/22 2025  AST 14*  ALT 22  ALKPHOS 69  BILITOT 1.5*  PROT 6.4*  ALBUMIN 2.6*   No results for input(s): "LIPASE", "AMYLASE" in the last 168 hours. No results for input(s): "AMMONIA" in the last 168 hours.  ABG    Component Value Date/Time   PHART 7.376 02/02/2022 2010   PCO2ART 25.9 (L) 02/02/2022 2010   PO2ART 74 (L) 02/02/2022 2010   HCO3 22.2 04/26/2022 2019   TCO2 21 (L) 04/26/2022 2019   TCO2 23 04/26/2022 2019   ACIDBASEDEF 9.0 (H) 02/02/2022 2010  O2SAT 91 04/26/2022 2019     Coagulation Profile: Recent Labs  Lab 04/26/22 2025  INR 2.3*    Cardiac Enzymes: No results for input(s): "CKTOTAL", "CKMB", "CKMBINDEX", "TROPONINI" in the last 168 hours.  HbA1C: Hgb A1c MFr Bld  Date/Time Value Ref Range Status  01/30/2022 11:15 PM 5.5 4.8 - 5.6 % Final    Comment:    (NOTE) Pre diabetes:          5.7%-6.4%  Diabetes:              >6.4%  Glycemic control for   <7.0% adults with diabetes     CBG: Recent Labs  Lab 04/26/22 2011  GLUCAP 192*    Review of Systems:   Patient is encephalopathic and/or intubated. Therefore history has been obtained from chart review.    Past Medical History:  She,  has a past medical history of Fall and Hard of hearing.   Surgical History:   Past Surgical History:  Procedure Laterality Date   BREAST BIOPSY Left 2020   beg   IR IVC FILTER PLMT / S&I /IMG GUID/MOD SED  02/02/2022     Social History:   reports that she has never smoked. She has  never used smokeless tobacco. She reports that she does not currently use alcohol. She reports current drug use. Drug: Marijuana.   Family History:  Her family history includes Breast cancer in her maternal aunt.   Allergies Allergies  Allergen Reactions   Penicillins Anaphylaxis     Home Medications  Prior to Admission medications   Medication Sig Start Date End Date Taking? Authorizing Provider  apixaban (ELIQUIS) 5 MG TABS tablet Take 1 tablet (5 mg total) by mouth 2 (two) times daily. 04/01/22 05/01/22  Manuella Ghazi, Pratik D, DO  calcium carbonate (TUMS - DOSED IN MG ELEMENTAL CALCIUM) 500 MG chewable tablet Chew 1 tablet (200 mg of elemental calcium total) by mouth 3 (three) times daily as needed for indigestion or heartburn. 04/01/22   Manuella Ghazi, Pratik D, DO  gabapentin (NEURONTIN) 300 MG capsule Take 1 capsule (300 mg total) by mouth 2 (two) times daily. 04/01/22 05/01/22  Manuella Ghazi, Pratik D, DO  pantoprazole (PROTONIX) 40 MG tablet Take 1 tablet (40 mg total) by mouth 2 (two) times daily. 04/01/22 05/01/22  Heath Lark D, DO     Critical care time: 45 minutes    JD Rexene Agent [facility] 04/26/2022, 9:43 PM  Please see [email or web address] for pager details.  From 7A-7P if no response, please call [phone]. After hours, please call ELink [phone].

## 2022-04-26 NOTE — ED Triage Notes (Addendum)
Pt arrives via [facility] from [facility] for [place]. Pt was found eating earlier today eating - pt has hx of dysphagia and is not supposed to be eating. Staff checked on her at 1830 and pt was found altered, somnolent. Per staff pt had a temp of 101 and was tachycardic at 120s. Upon EMS arrival pt was 85% on 4L O2, ems placed pt on NRB at 15L and SpO2 increased to 92%. ETCO2 15, 112/70, 40RR. GCS 6, pt has minimal responsive to pain, no eye opening. Ems gave total 563m NS.

## 2022-04-27 DIAGNOSIS — J9601 Acute respiratory failure with hypoxia: Secondary | ICD-10-CM

## 2022-04-27 DIAGNOSIS — Z9911 Dependence on respirator [ventilator] status: Secondary | ICD-10-CM | POA: Diagnosis not present

## 2022-04-27 LAB — CBC
HCT: 47.8 % — ABNORMAL HIGH (ref 36.0–46.0)
Hemoglobin: 14.7 g/dL (ref 12.0–15.0)
MCH: 28.5 pg (ref 26.0–34.0)
MCHC: 30.8 g/dL (ref 30.0–36.0)
MCV: 92.8 fL (ref 80.0–100.0)
Platelets: 318 10*3/uL (ref 150–400)
RBC: 5.15 MIL/uL — ABNORMAL HIGH (ref 3.87–5.11)
RDW: 15.3 % (ref 11.5–15.5)
WBC: 27.2 10*3/uL — ABNORMAL HIGH (ref 4.0–10.5)
nRBC: 0 % (ref 0.0–0.2)

## 2022-04-27 LAB — GLUCOSE, CAPILLARY
Glucose-Capillary: 214 mg/dL — ABNORMAL HIGH (ref 70–99)
Glucose-Capillary: 179 mg/dL — ABNORMAL HIGH (ref 70–99)
Glucose-Capillary: 165 mg/dL — ABNORMAL HIGH (ref 70–99)
Glucose-Capillary: 195 mg/dL — ABNORMAL HIGH (ref 70–99)
Glucose-Capillary: 171 mg/dL — ABNORMAL HIGH (ref 70–99)
Glucose-Capillary: 195 mg/dL — ABNORMAL HIGH (ref 70–99)

## 2022-04-27 LAB — BASIC METABOLIC PANEL
Anion gap: 11 (ref 5–15)
BUN: 21 mg/dL (ref 8–23)
CO2: 19 mmol/L — ABNORMAL LOW (ref 22–32)
Calcium: 8.5 mg/dL — ABNORMAL LOW (ref 8.9–10.3)
Chloride: 114 mmol/L — ABNORMAL HIGH (ref 98–111)
Creatinine, Ser: 0.66 mg/dL (ref 0.44–1.00)
GFR, Estimated: >60 mL/min (ref >60)
Glucose, Bld: 229 mg/dL — ABNORMAL HIGH (ref 70–99)
Potassium: 2.8 mmol/L — ABNORMAL LOW (ref 3.5–5.1)
Sodium: 144 mmol/L (ref 135–145)

## 2022-04-27 LAB — MRSA NEXT GEN BY PCR, NASAL: MRSA by PCR Next Gen: NOT DETECTED

## 2022-04-27 LAB — PROCALCITONIN: Procalcitonin: 0.26 ng/mL

## 2022-04-27 LAB — PROTIME-INR
INR: 2.7 — ABNORMAL HIGH (ref 0.8–1.2)
Prothrombin Time: 28.4 seconds — ABNORMAL HIGH (ref 11.4–15.2)

## 2022-04-27 LAB — TRIGLYCERIDES: Triglycerides: 91 mg/dL (ref <150)

## 2022-04-27 LAB — ETHANOL: Alcohol, Ethyl (B): <10 mg/dL (ref <10)

## 2022-04-27 LAB — AMMONIA: Ammonia: 23 umol/L (ref 9–35)

## 2022-04-27 LAB — TSH: TSH: 1.12 u[IU]/mL (ref 0.350–4.500)

## 2022-04-27 LAB — TROPONIN I (HIGH SENSITIVITY): Troponin I (High Sensitivity): 50 ng/L — ABNORMAL HIGH (ref <18)

## 2022-04-27 LAB — LACTIC ACID, PLASMA: Lactic Acid, Venous: 2.6 mmol/L (ref 0.5–1.9)

## 2022-04-27 MED ORDER — PROPOFOL 1000 MG/100ML IV EMUL: 5.0000 ug/kg/min | INTRAVENOUS | Status: DC

## 2022-04-27 MED ORDER — VITAL HIGH PROTEIN PO LIQD
1000.0000 mL | ORAL | Status: DC
  Administered 2022-04-27: 1000 mL

## 2022-04-27 MED ORDER — POTASSIUM CHLORIDE 20 MEQ PO PACK
40.0000 meq | PACK | Freq: Once | ORAL | Status: AC
  Administered 2022-04-27: 40 meq
  Filled 2022-04-27: qty 2

## 2022-04-27 MED ORDER — SODIUM CHLORIDE 0.9 % IV SOLN: INTRAVENOUS | Status: DC | PRN

## 2022-04-27 MED ORDER — VITAL HIGH PROTEIN PO LIQD: 1000.0000 mL | ORAL | Status: DC

## 2022-04-27 MED ORDER — FENTANYL 2500MCG IN NS 250ML (10MCG/ML) PREMIX INFUSION
25.0000 ug/h | INTRAVENOUS | Status: DC
  Administered 2022-04-29: 50 ug/h via INTRAVENOUS
  Filled 2022-04-27: qty 250

## 2022-04-27 MED ORDER — LEVOFLOXACIN IN D5W 750 MG/150ML IV SOLN: 750.0000 mg | INTRAVENOUS | Status: DC

## 2022-04-27 MED ORDER — SODIUM CHLORIDE 0.9 % IV SOLN
2.0000 g | Freq: Three times a day (TID) | INTRAVENOUS | Status: DC
  Administered 2022-04-27 – 2022-04-29 (×6): 2 g via INTRAVENOUS
  Filled 2022-04-27 (×8): qty 10

## 2022-04-27 MED ORDER — VITAL HIGH PROTEIN PO LIQD: 1000.0000 mL | ORAL | Status: AC

## 2022-04-27 MED ORDER — POTASSIUM CHLORIDE 10 MEQ/100ML IV SOLN
10.0000 meq | INTRAVENOUS | Status: AC
  Administered 2022-04-27 (×4): 10 meq via INTRAVENOUS
  Filled 2022-04-27 (×4): qty 100

## 2022-04-27 MED ORDER — PROSOURCE TF20 ENFIT COMPATIBL EN LIQD
60.0000 mL | Freq: Every day | ENTERAL | Status: DC
  Administered 2022-04-27 – 2022-04-29 (×3): 60 mL
  Filled 2022-04-27 (×3): qty 60

## 2022-04-27 MED ORDER — SODIUM CHLORIDE 3 % IN NEBU
4.0000 mL | INHALATION_SOLUTION | RESPIRATORY_TRACT | Status: DC
  Administered 2022-04-27 – 2022-04-29 (×11): 4 mL via RESPIRATORY_TRACT
  Filled 2022-04-27 (×11): qty 4

## 2022-04-27 MED ORDER — PHYTONADIONE 5 MG PO TABS
10.0000 mg | ORAL_TABLET | Freq: Once | ORAL | Status: AC
Start: 2022-04-27 — End: 2022-04-27
  Administered 2022-04-27: 10 mg
  Filled 2022-04-27: qty 2

## 2022-04-27 MED ORDER — OSMOLITE 1.5 CAL PO LIQD
1000.0000 mL | ORAL | Status: DC
  Administered 2022-04-28: 1000 mL

## 2022-04-27 MED ORDER — INSULIN GLARGINE-YFGN 100 UNIT/ML ~~LOC~~ SOLN
15.0000 [IU] | Freq: Two times a day (BID) | SUBCUTANEOUS | Status: DC
  Filled 2022-04-27 (×2): qty 0.15

## 2022-04-27 MED ORDER — FREE WATER
50.0000 mL | Status: DC
  Administered 2022-04-27 – 2022-04-28 (×6): 50 mL

## 2022-04-27 MED ORDER — PANTOPRAZOLE 2 MG/ML SUSPENSION
40.0000 mg | Freq: Two times a day (BID) | ORAL | Status: DC
  Administered 2022-04-27 – 2022-04-29 (×5): 40 mg
  Filled 2022-04-27 (×5): qty 20

## 2022-04-27 NOTE — Progress Notes (Signed)
[facility] ADULT ICU REPLACEMENT PROTOCOL   The patient does apply for the [facility] Adult ICU Electrolyte Replacment Protocol based on the criteria listed below:   1.Exclusion criteria: TCTS patients, ECMO patients, and Dialysis patients 2. Is GFR >/= 30 ml/min? Yes.    Patient's GFR today is >60 3. Is SCr </= 2? Yes.   Patient's SCr is 0.66 mg/dL 4. Did SCr increase >/= 0.5 in 24 hours? No. 5.Pt's weight >40kg  Yes.   6. Abnormal electrolyte(s): K+2.8  7. Electrolytes replaced per protocol 8.  Call MD STAT for K+ </= 2.5, Phos </= 1, or Mag </= 1 Physician:  Imagene Riches 04/27/2022 2:44 AM

## 2022-04-27 NOTE — Progress Notes (Signed)
ANTICOAGULATION CONSULT NOTE - Initial Consult  Pharmacy Consult for Heparin Indication: pulmonary embolus  Allergies  Allergen Reactions   Penicillins Anaphylaxis    Patient Measurements: Height: '5\' 5"'$  (165.1 cm) Weight: 62.4 kg (137 lb 9.1 oz) IBW/kg (Calculated) : 57  Vital Signs: Temp: 99.3 F (37.4 C) (08/27 2345) Temp Source: Rectal (08/27 2039) BP: 115/74 (08/27 2345) Pulse Rate: 100 (08/27 2345)  Labs: Recent Labs    04/26/22 2019 04/26/22 2025 04/26/22 2144 04/26/22 2250 04/27/22 0103  HGB 16.7*  17.0* 16.8* 13.6  --  14.7  HCT 49.0*  50.0* 52.5* 40.0  --  47.8*  PLT  --  430*  --   --  318  LABPROT  --  25.3*  --   --   --   INR  --  2.3*  --   --   --   CREATININE 0.50 1.00  --   --   --   TROPONINIHS  --  66*  --  86*  --     Estimated Creatinine Clearance: 50.5 mL/min (by C-G formula based on SCr of 1 mg/dL).   Medical History: Past Medical History:  Diagnosis Date   Fall    Hard of hearing     Medications:  No current facility-administered medications on file prior to encounter.   Current Outpatient Medications on File Prior to Encounter  Medication Sig Dispense Refill   apixaban (ELIQUIS) 5 MG TABS tablet Take 1 tablet (5 mg total) by mouth 2 (two) times daily. 60 tablet 0   calcium carbonate (TUMS - DOSED IN MG ELEMENTAL CALCIUM) 500 MG chewable tablet Chew 1 tablet (200 mg of elemental calcium total) by mouth 3 (three) times daily as needed for indigestion or heartburn. 30 tablet 0   gabapentin (NEURONTIN) 300 MG capsule Take 1 capsule (300 mg total) by mouth 2 (two) times daily. 60 capsule 0   omeprazole (PRILOSEC) 20 MG capsule Take 20 mg by mouth 2 (two) times daily before a meal.     potassium chloride SA (KLOR-CON M) 20 MEQ tablet Take 20 mEq by mouth daily.     pantoprazole (PROTONIX) 40 MG tablet Take 1 tablet (40 mg total) by mouth 2 (two) times daily. (Patient not taking: Reported on 04/26/2022) 60 tablet 0     Assessment: 66  y.o. female with h/o recent PE, Eliquis on hold, for heparin.  Last dose 8/27 0900 Goal of Therapy:  aPTT 66-102 sec Heparin level 0.3-0.7 units/ml Monitor platelets by anticoagulation protocol: Yes   Plan:  Discussed with MD.  Given CT results and IVC in place, will hold on starting heparin until INR < 2  Jarmarcus Wambold, Bronson Curb 04/27/2022,1:46 AM

## 2022-04-27 NOTE — Progress Notes (Signed)
[place] [facility] DSS to notify them that Jennifer Pacheco has been readmitted to the hospital. L/M for [facility] requesting her to call back.  Julian Hy, DO 04/27/22 8:13 AM [place] [facility]

## 2022-04-27 NOTE — Progress Notes (Signed)
NAME:  Jennifer Pacheco, MRN:  [id number], DOB:  1956/02/25, LOS: 1 ADMISSION DATE:  04/26/2022, CONSULTATION DATE:  8/27 REFERRING MD:  Dr. Truett Mainland, CHIEF COMPLAINT:  acute resp failure   History of Present Illness:  Patient is a 66 year old female with pertinent PMH of HTN, DMT2, brain tumor (meningioma) with mass effect and vasogenic edema, distal esophageal thickening, recent PE with IVC filter placement on Eliquis presents to [facility] ED on 8/27 with AMS.  Patient was recently admitted to [facility] ED 01/30/2022 and transferred to [facility] with AMS.  Patient was hypoxic/hypotensive.  Patient intubated and started on pressors.  CT chest showing submassive PE and was started on heparin.  Broad-spectrum antibiotics started.  CT abdomen and UA concerning for UTI with possible pyelonephritis.  CT head showing large mass in R basal ganglia with surrounding edema; mass effect with near complete effacement of R lateral ventricle and an 11 mm leftward shift.  NSG was consulted and started patient on Decadron.  Throughout stay patient experienced ABLA due to retroperitoneal bleed.  Heparin was stopped and IVC filter placed.  Patient then developed DVT in LLE on 7/5.  Vascular rechallenge patient with IV heparin and transition patient to Eliquis without blood loss. APS involved for guardianship and [place] was appointed interim guardian.  Palliative care was following. Patient was discharged on 04/01/2022 to [facility], [place].  Patient was to follow-up with NSG in 1 month and follow-up with GI for possible endoscopy Per NSG patient was weaned off dexamethasone 1 week after discharge.  On 8/27 staff at [facility] state patient was having respite tori distress and AMS.  Concern patient was eating and should not be with dysphagia. Patient transported to [facility] ED for further eval.  Upon arrival to [facility] ED on 8/27, patient hypoxic with improvement on NRB.  Vital stable.  GCS 6.  ED physician intubated due to  hypoxia and low GCS.  Tmax 102 F and WBC 23.  UA indicative of UTI and CXR showing small lower lobe consolidation L>R. Started on Levaquin, Flagyl, bank (anaphylaxis to penicillin).  Glucose 211.  ABG WNL postintubation.  LA 2.9.  NA 149.  Troponin 66 and BNP 159.  CT head, ABD/pelvis, PE chest pending.  Other labs pending.  PCCM consulted for ICU admission.  Pertinent  Medical History   HTN, DMT2, brain tumor (meningioma) with mass effect and vasogenic edema, distal esophageal thickening, recent PE with IVC filter placement on Eliquis  Significant Hospital Events: Including procedures, antibiotic start and stop dates in addition to other pertinent events   8/27 admitted to [facility]; intubated  Interim History / Subjective:  Intubated on vent On prop/fent  Vital stable, afebrile overnight  Objective   Blood pressure 127/83, pulse (!) 105, temperature 99.7 F (37.6 C), resp. rate 17, height '5\' 5"'$  (1.651 m), weight 62.4 kg, SpO2 100 %.    Vent Mode: PRVC FiO2 (%):  [40 %-100 %] 40 % Set Rate:  [18 bmp] 18 bmp Vt Set:  [450 mL] 450 mL PEEP:  [5 cmH20] 5 cmH20 Plateau Pressure:  [15 cmH20-17 cmH20] 16 cmH20   Intake/Output Summary (Last 24 hours) at 04/27/2022 0941 Last data filed at 04/27/2022 0800 Gross per 24 hour  Intake 2546.75 ml  Output 410 ml  Net 2136.75 ml   Filed Weights   04/26/22 2018 04/26/22 2339 04/27/22 0500  Weight: 65 kg 62.4 kg 62.4 kg    Examination: General:  critically ill appearing on mech vent HEENT:  ETT in place, PERRL, clear  Neuro: Response to painful stimuli; PERRL; forcibly keeping eyes closed, not moving left extremity CV: s1s2, no m/r/g PULM:  CTABL; on mech vent PRVC GI: soft, but firm mass in lower right quadrant, NTTP, non-distended  Extremities: warm/dry, trace BLE edema  Skin: no rashes or lesions appreciated  Resolved Hospital Problem list     Assessment & Plan:  Acute respiratory failure with hypoxia Possible aspiration pneumonia WBC  up 27.2 (23) today, afebrile overnight. Lactic acid trending down.  P: -LTVV strategy with tidal volumes of 6-8 cc/kg ideal body weight -Wean PEEP/FiO2 for SpO2 >92% -VAP bundle in place -Daily SAT and SBT -PAD protocol in place -wean sedation for RASS goal 0 to -1 -broad spectrum abx (vanc, levaquin, flagyl) -send tracheal aspirate  Acute encephalopathy:   Hx of meningioma with mass effect and vasogenic edema:  -8/27 CT head stable appearing Large R meningioma in frontotemproal region w/ significant midline shift; no new focal abnormality. TSH, Amonia, and Ethanol level WNL, UDS pending. UA concerning for UTI. AMS likely 2/2 acute illness. P: -continue broad-spectrum antibiotics -Limit sedating meds -As needed fentanyl for RASS 0 to -1 -f/u outpt with NSG  Sepsis: recently admitted 01/2022 with UTI w/ pyelonephritis UTI Possible aspiration pneumonia Multifocal PNA on CT Chest. UA positive for leukocytes and nitrite, w/ bili, ket, prot and rbc, WBC 21-50. PCT elevated to .26, encouraging abx.  Afebrile overnight, on broad spectrum abx. Bcx NGTD, trach aspirate and urine culture pending. WBC tended up.  P:  -F/u UC, BC x2, trach aspirate -Stop levaquin, vanc -start aztreonam, continue flagyl -continue IV fluid -Trend LA and troponin -Trend WBC/fever curve  Hyperglycemia DMT2 CBG range 192-256, 8 units short acting given. Will continue to monitor. P: -SSI and CBG monitoring -Consider tube feed coverage  Hypernatremia  resolved Hypokalemia Resolved, Na 144 today, was likely in setting of hypovolemia. K 2.8 this am. Will start TF today w/ free H2O. 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 BNP 159.2, trop flat. LA trending down. No PE on CT chest. INR trending up, could be Vitamin K deficiency from hx of abx. Patient will need 3 months AC tx. Will  hold for now given elevated INR.  P: -trend LA -Vitamin K 10 mg -AM INR -SCD for DVT ppx   GOC Patient deamed incompetent; APS was involved for guardianship and [place] was appointed interim guardian. P: -GOC discussion with guardian DSS -consult palliative   Hx of esophageal thickening P: -further f/u outpt GI; will need endoscopy -continue PPI   Best Practice (right click and "Reselect all SmartList Selections" daily)   Diet/type: tubefeeds DVT prophylaxis: SCD  GI prophylaxis: PPI Lines: N/A Foley:  Yes, and it is still needed Code Status:  full code Last date of multidisciplinary goals of care discussion  Labs   CBC: Recent Labs  Lab 04/26/22 2019 04/26/22 2025 04/26/22 2144 04/27/22 0103  WBC  --  23.0*  --  27.2*  NEUTROABS  --  21.6*  --   --   HGB 16.7*  17.0* 16.8* 13.6 14.7  HCT 49.0*  50.0* 52.5* 40.0 47.8*  MCV  --  90.7  --  92.8  PLT  --  430*  --  756    Basic Metabolic Panel: Recent Labs  Lab 04/26/22 2019 04/26/22 2025 04/26/22 2144  04/26/22 2250 04/27/22 0103  NA 146*  147* 149* 147*  --  144  K 4.0  4.0 4.0 2.7*  --  2.8*  CL 114* 115*  --   --  114*  CO2  --  19*  --   --  19*  GLUCOSE 211* 211*  --   --  229*  BUN 31* 24*  --   --  21  CREATININE 0.50 1.00  --   --  0.66  CALCIUM  --  9.0  --   --  8.5*  MG  --   --   --  1.7  --    GFR: Estimated Creatinine Clearance: 63.1 mL/min (by C-G formula based on SCr of 0.66 mg/dL). Recent Labs  Lab 04/26/22 2024 04/26/22 2025 04/26/22 2250 04/27/22 0103  PROCALCITON  --   --  0.26  --   WBC  --  23.0*  --  27.2*  LATICACIDVEN 2.9*  --  3.0* 2.6*    Liver Function Tests: Recent Labs  Lab 04/26/22 2025  AST 14*  ALT 22  ALKPHOS 69  BILITOT 1.5*  PROT 6.4*  ALBUMIN 2.6*   No results for input(s): "LIPASE", "AMYLASE" in the last 168 hours. Recent Labs  Lab 04/27/22 0103  AMMONIA 23    ABG    Component Value Date/Time   PHART 7.432 04/26/2022  2144   PCO2ART 32.6 04/26/2022 2144   PO2ART 290 (H) 04/26/2022 2144   HCO3 21.8 04/26/2022 2144   TCO2 23 04/26/2022 2144   ACIDBASEDEF 2.0 04/26/2022 2144   O2SAT 100 04/26/2022 2144     Coagulation Profile: Recent Labs  Lab 04/26/22 2025 04/27/22 0713  INR 2.3* 2.7*    Cardiac Enzymes: No results for input(s): "CKTOTAL", "CKMB", "CKMBINDEX", "TROPONINI" in the last 168 hours.  HbA1C: Hgb A1c MFr Bld  Date/Time Value Ref Range Status  01/30/2022 11:15 PM 5.5 4.8 - 5.6 % Final    Comment:    (NOTE) Pre diabetes:          5.7%-6.4%  Diabetes:              >6.4%  Glycemic control for   <7.0% adults with diabetes     CBG: Recent Labs  Lab 04/26/22 2011 04/26/22 2338 04/27/22 0308 04/27/22 0822  GLUCAP 192* 256* 214* 179*    Review of Systems:   Patient is encephalopathic and/or intubated. Therefore history has been obtained from chart review.    Past Medical History:  She,  has a past medical history of Fall and Hard of hearing.   Surgical History:   Past Surgical History:  Procedure Laterality Date   BREAST BIOPSY Left 2020   beg   IR IVC FILTER PLMT / S&I /IMG GUID/MOD SED  02/02/2022     Social History:   reports that she has never smoked. She has never used smokeless tobacco. She reports that she does not currently use alcohol. She reports current drug use. Drug: Marijuana.   Family History:  Her family history includes Breast cancer in her maternal aunt.   Allergies Allergies  Allergen Reactions   Penicillins Anaphylaxis     Home Medications  Prior to Admission medications   Medication Sig Start Date End Date Taking? Authorizing Provider  apixaban (ELIQUIS) 5 MG TABS tablet Take 1 tablet (5 mg total) by mouth 2 (two) times daily. 04/01/22 05/01/22  Manuella Ghazi, Pratik D, DO  calcium carbonate (TUMS - DOSED IN MG ELEMENTAL CALCIUM) 500  MG chewable tablet Chew 1 tablet (200 mg of elemental calcium total) by mouth 3 (three) times daily as needed for  indigestion or heartburn. 04/01/22   Manuella Ghazi, Pratik D, DO  gabapentin (NEURONTIN) 300 MG capsule Take 1 capsule (300 mg total) by mouth 2 (two) times daily. 04/01/22 05/01/22  Manuella Ghazi, Pratik D, DO  pantoprazole (PROTONIX) 40 MG tablet Take 1 tablet (40 mg total) by mouth 2 (two) times daily. 04/01/22 05/01/22  Heath Lark D, DO     Critical care time: 45 minutes    Holley Bouche, MD PGY-2 [facility] 04/27/2022, 9:41 AM  Please see [email or web address] for pager details.  From 7A-7P if no response, please call [phone]. After hours, please call ELink [phone].

## 2022-04-27 NOTE — Progress Notes (Signed)
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 Patient sent to the ED by patient's Facility due to elevated temp, low 02 sats, and increased congestion. Jennifer Pacheco was admitted to [facility] on 08.27.23 with Acute hypoxic respiratory failure, suspected aspiration PNA. Per Dr.Feldmann, ACC MD, this is a related hospital admission.    Visited Jennifer Pacheco at bedside. Patient not responsive to voice, spontaneous movement of right arm noted, left eye open, but does not appear to focus or track. Orally intubated, and oral NG tube present. Tube feeds currently stopped. Placed a phone call to patient's legal guardian, Doreen Beam, [phone],IAXK a voicemail. Awaiting call back.    Patient is inpatient appropriate due to hypoxic respiratory failure requiring mechanical ventilatory support and treatment of PNA with IV antibotics.    V/S:  139/107 bp,  99.1 Temp, 105 bpm, 18 RR, 95% on Ventilator Fi02 '@40'$ %  I&O: 2442.3/375  Labs:  ARTERIAL pO2, Arterial: 290 (H) Sodium: 147 (H) Potassium: 2.7 (LL) Troponin I (High Sensitivity): 86 (H) Lactic Acid, Venous: 3.0 (HH) Prothrombin Time: 28.4 (H) INR: 2.7 (H)  Diagnostics:  CT ANGIOGRAPHY CHEST  CT ABDOMEN AND PELVIS WITH CONTRAST IMPRESSION: No evidence of pulmonary embolism. Multifocal pneumonia, left lower lobe predominant, possibly on the basis of aspiration. Large left retroperitoneal hematoma, improved.  CT HEAD WITHOUT CONTRAST IMPRESSION: Stable appearing large right meningioma involving the right frontotemporal region with significant midline shift. No new focal abnormality is noted.  IV/PRN:  rocuronium (ZEMURON) injection - PRN, IV x1 fentaNYL (SUBLIMAZE) injection 25-100 mcg - q9mn, PRN, IV x1 etomidate (AMIDATE) injection - PRN, IV x1 vancomycin (VANCOREADY) IVPB 1500 mg/300 mL -  '1500mg'$ , once, IV, x4 propofol (DIPRIVAN) 1000 MG/100ML infusion - 0-19,584mhr, 0-50 mcg/kg/min, IV x4 propofol (DIPRIVAN) 1000 MG/100ML infusion - 1.87-29.95ml/hr, 5-80 mcg/kg/min, IV x6 potassium chloride 10 mEq in 100 mL IVPB - q1hr, IV, x8 metroNIDAZOLE (FLAGYL) IVPB 500 mg - q12hrs, IV x3 levofloxacin (LEVAQUIN) IVPB 750 mg - once, IV x2 fentaNYL 250041min NS 250m74m0mc35m) infusion-PREMIX - 2.5-20ml/hr, 25-200mcg6m continuous, IV x20 aztreonam (AZACTAM) 2 g in sodium chloride 0.9 % 100 mL IVPB -  q8hrs, IV, x1 lactated ringers bolus 1,000 mL - once, IV x1 lactated ringers bolus 500 mL -once, IV x2   Problem list: Assessment/plan: Acute hypoxic resp failure Suspected aspiration pna -titrate vent -sat/sbt when able.  -empiric abx -resp cx pending Dysphagia:  -pt was recommended for dysphagia 3 diet and thin liquids during previous hospitalization -per snf, reportedly noted to have more solid food intake despite recs   Sepsis:  -2/2 suspected aspiration +/- uti -bp stable at this time without shock -empiric abx -awaiting cx data -wbc elevated   Acute toxic encephalopathy:  -cth negative for acute process but stable meningioma with significant shift -consider eeg in am.  -check ammonia, tsh -uds pending as well  GOC: Patient is currently a Full Code. Trying to get in touch with patient's guardian to discuss goals of care and DNR.      D/C planning: Ongoing  Family: Placed a phone call to patient's legal guardian, Jason Doreen [phone] a voicemail. Awaiting call back.   IDT: Updated   Medication list and Transfer Summary placed on Shadow Chart.   Should patient need ambulance transfer - Please use [facility] for our active hospice patients.  Zigmund Gottron, RN [facility] Liaison  [phone]

## 2022-04-27 NOTE — Progress Notes (Signed)
Pt placed back on full ventilator support for night rest.

## 2022-04-27 NOTE — Progress Notes (Signed)
Initial Nutrition Assessment  DOCUMENTATION CODES:   Non-severe (moderate) malnutrition in context of chronic illness  INTERVENTION:   Tube feeding via OG tube:  D/C Vital High Protein  Osmolite 1.5 at 30 ml/h and increase by 10 ml every 8 hours to goal rate of 50 ml/hr (1200 ml per day) Prosource TF20 60 ml daily   Provides 1880 kcal, 95 gm protein, 912 ml free water daily   50 ml free water every 4 hours  Total free water: 1212 ml   NUTRITION DIAGNOSIS:   Moderate Malnutrition related to chronic illness as evidenced by moderate fat depletion, severe muscle depletion.  GOAL:   Patient will meet greater than or equal to 90% of their needs  MONITOR:   TF tolerance  REASON FOR ASSESSMENT:   Consult Enteral/tube feeding initiation and management  ASSESSMENT:   Pt who is a ward of the state with PMH of R meningioma, distal esophageal thickening, HTN, DM, recent admission for PE under hospice care admitted from her SNF with aspiration PNA.   Pt currently on vent support. Spoke with RN at bedside.  Palliative care consulted to assist with GOC as pt is a ward of the state.   Medications reviewed and include: colace, SSI, protonix, miralax  Fentanyl   Labs reviewed: K 2.8   16 F OG tube: tip in proximal to mid stomach   NUTRITION - FOCUSED PHYSICAL EXAM:  Flowsheet Row Most Recent Value  Orbital Region Severe depletion  Upper Arm Region Mild depletion  Thoracic and Lumbar Region No depletion  Buccal Region Moderate depletion  Temple Region Severe depletion  Clavicle Bone Region Moderate depletion  Clavicle and Acromion Bone Region Severe depletion  Scapular Bone Region Severe depletion  Dorsal Hand Unable to assess  [edematous]  Patellar Region Severe depletion  Anterior Thigh Region Severe depletion  Posterior Calf Region Moderate depletion  Edema (RD Assessment) Mild  Hair Reviewed  Eyes Reviewed  Mouth Unable to assess  Skin Reviewed  Nails Reviewed        Diet Order:   Diet Order             Diet NPO time specified Except for: Other (See Comments)  Diet effective now                   EDUCATION NEEDS:   No education needs have been identified at this time  Skin:  Skin Assessment: Skin Integrity Issues: Skin Integrity Issues:: Stage I Stage I: sacrum  Last BM:  unknown  Height:   Ht Readings from Last 1 Encounters:  04/26/22 '5\' 5"'$  (1.651 m)    Weight:   Wt Readings from Last 1 Encounters:  04/27/22 62.4 kg    BMI:  Body mass index is 22.89 kg/m.  Estimated Nutritional Needs:   Kcal:  1700-1900  Protein:  85-100 grams  Fluid:  >1.7 L/day  Lockie Pares., RD, LDN, CNSC See AMiON for contact information

## 2022-04-27 NOTE — Progress Notes (Addendum)
eLink Physician-Brief Progress Note Patient Name: Jennifer Pacheco DOB: [birth year] MRN: [id number]   Date of Service  04/27/2022  HPI/Events of Note  27 F recently diagnosed meningioma with mass effect, submassive PE developed retroperitoneal bleed while on heparin drip s/P IVC filter insertion. Discharged 8/2. She presents from NH for respiratory distress and altered sensorium now intubated. Concern for aspiration.  eICU Interventions  CT with multifocal pneumonia on vancomycin and metronidazole. Received Levofoloxacin in the ED. No PE, refusing evening dose of enoxaparin Retroperitoneal hematoma improved, meningioma and midline shift stable On propofol and fentanyl drip from the ED. Will continue for now. Bedside rounding to assess for appropriateness of a sedation holiday     Intervention Category Evaluation Type: New Patient Evaluation  Judd Lien 04/27/2022, 12:00 AM

## 2022-04-27 NOTE — TOC Progression Note (Signed)
Transition of Care [facility]) - Initial/Assessment Note    Patient Details  Name: Jennifer Pacheco MRN: [id number] Date of Birth: 1955/11/01  [facility] CM/SW Contact:    Milinda Antis, [place] Phone Number: 04/27/2022, 2:13 PM  Clinical Narrative:                 CSW contacted [facility] to inquire about whether the patient is LT or ST at the facility and is awaiting a response.    TOC will continue to follow.          Patient Goals and CMS Choice        Expected Discharge Plan and Services                                                Prior Living Arrangements/Services                       Activities of Daily Living      Permission Sought/Granted                  Emotional Assessment              Admission diagnosis:  Intracranial mass [R90.0] Bacterial urinary infection [N39.0, A49.9] Fever, unspecified fever cause [R50.9] Altered mental status, unspecified altered mental status type [M19.62] Acute metabolic encephalopathy [I29.79] Sepsis, due to unspecified organism, unspecified whether acute organ dysfunction present [facility]) [A41.9] Patient Active Problem List   Diagnosis Date Noted   AKI (acute kidney injury) ([place]) 02/06/2022   ABLA likely due to retroperitoneal bleed 89/21/1941   Acute metabolic encephalopathy 74/04/1447   Controlled NIDDM-2 02/05/2022   Physical deconditioning 02/05/2022   Esophageal dysphagia    Pressure injury of skin 01/31/2022   Acute pulmonary embolism with acute cor pulmonale ([place]) 01/30/2022   Acute respiratory failure with hypoxia ([place])    Hypovolemic shock ([place])    Brain mass with vasogenic edema and mass effect    PCP:  [name], NP Pharmacy:   [place], [place] [address] [place] [address] Phone: [phone] Fax: [phone]     Social Determinants of Health (SDOH) Interventions    Readmission  Risk Interventions    02/11/2022    2:16 PM  Readmission Risk Prevention Plan  Transportation Screening Complete  PCP or Specialist Appt within 5-7 Days Complete  Home Care Screening Complete  Medication Review (RN CM) Complete

## 2022-04-27 NOTE — Progress Notes (Signed)
Below is a copy of the letter sent to [place] with [facility] to address her care.      To Jaynee Eagles and the [facility] [place],   Jennifer Pacheco is a 66 y/o woman with multiple chronic medical issues who was readmitted to [facility] last night and required intubation, mechanical ventilation, and ICU admission for severe bilateral pneumonia. She was recently admitted from 01/30/22- 04/01/22. 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 She was discharged to [facility] skilled nursing facility due to inability to care for herself. She has been under the care of an outpatient hospice organization. Previous discussions with her guardian and the hospice organization had been ongoing regarding her code status, but her paperwork had not yet been completed.  This admission she is admitted to the ICU requiring mechanical ventilation to support her breathing. She is on antibiotics to address her pneumonia. Her pneumonia is most likely due to aspiration that is likely attributable to her brain meningioma and the pressure it is placing on her brain. She has dysphagia at baseline and is not able to eat a regular diet due to her swallowing difficulties. I am concerned that she will continue to have ongoing swallowing difficulty and will have recurrent aspiration events with likely pneumonia and respiratory failure in the future due to her meningioma. Her previous workup for her meningioma has been limited due to her competing medical conditions, and with her current poor health she would not be a candidate for resection of her tumor.   As we continue to care for Jennifer Pacheco through her sepsis and respiratory failure due to pneumonia, I think it is reasonable to consider her overall clinical trajectory and goals. If she progresses despite  aggressive medical therapy to the point of a cardiac arrest, I think it unlikely that resuscitation will provide meaningful benefit. I recommend changing her code status to DNR at this time; I recommend completing a [place] DNR form to address this moving forward as she is likely to have progressive decline due to her meningioma and baseline poor health. We will continue to treat her aggressively to manage her medical issues, but may need to re-address her overall care goals based on her clinical trajectory at a later time.  I am available to discuss her care from 7AM-7PM this week. If you need to contact our team after hours, you can contact our team pager on [phone].  Sincerely,  Julian Hy, DO 04/27/22 1:54 PM [facility]

## 2022-04-28 DIAGNOSIS — R4182 Altered mental status, unspecified: Secondary | ICD-10-CM

## 2022-04-28 DIAGNOSIS — Z9911 Dependence on respirator [ventilator] status: Secondary | ICD-10-CM | POA: Diagnosis not present

## 2022-04-28 DIAGNOSIS — E440 Moderate protein-calorie malnutrition: Secondary | ICD-10-CM | POA: Insufficient documentation

## 2022-04-28 DIAGNOSIS — N3000 Acute cystitis without hematuria: Secondary | ICD-10-CM

## 2022-04-28 LAB — CBC
HCT: 41.5 % (ref 36.0–46.0)
Hemoglobin: 13.5 g/dL (ref 12.0–15.0)
MCH: 29.1 pg (ref 26.0–34.0)
MCHC: 32.5 g/dL (ref 30.0–36.0)
MCV: 89.4 fL (ref 80.0–100.0)
Platelets: 335 10*3/uL (ref 150–400)
RBC: 4.64 MIL/uL (ref 3.87–5.11)
RDW: 15.5 % (ref 11.5–15.5)
WBC: 24.7 10*3/uL — ABNORMAL HIGH (ref 4.0–10.5)
nRBC: 0 % (ref 0.0–0.2)

## 2022-04-28 LAB — MAGNESIUM: Magnesium: 2 mg/dL (ref 1.7–2.4)

## 2022-04-28 LAB — GLUCOSE, CAPILLARY
Glucose-Capillary: 207 mg/dL — ABNORMAL HIGH (ref 70–99)
Glucose-Capillary: 238 mg/dL — ABNORMAL HIGH (ref 70–99)
Glucose-Capillary: 183 mg/dL — ABNORMAL HIGH (ref 70–99)
Glucose-Capillary: 201 mg/dL — ABNORMAL HIGH (ref 70–99)
Glucose-Capillary: 203 mg/dL — ABNORMAL HIGH (ref 70–99)
Glucose-Capillary: 234 mg/dL — ABNORMAL HIGH (ref 70–99)

## 2022-04-28 LAB — BASIC METABOLIC PANEL
Anion gap: 8 (ref 5–15)
BUN: 29 mg/dL — ABNORMAL HIGH (ref 8–23)
CO2: 23 mmol/L (ref 22–32)
Calcium: 8.8 mg/dL — ABNORMAL LOW (ref 8.9–10.3)
Chloride: 115 mmol/L — ABNORMAL HIGH (ref 98–111)
Creatinine, Ser: 0.39 mg/dL — ABNORMAL LOW (ref 0.44–1.00)
GFR, Estimated: >60 mL/min (ref >60)
Glucose, Bld: 209 mg/dL — ABNORMAL HIGH (ref 70–99)
Potassium: 3.2 mmol/L — ABNORMAL LOW (ref 3.5–5.1)
Sodium: 146 mmol/L — ABNORMAL HIGH (ref 135–145)

## 2022-04-28 LAB — URINE CULTURE: Culture: >=100000 — AB

## 2022-04-28 LAB — LACTIC ACID, PLASMA: Lactic Acid, Venous: 1.5 mmol/L (ref 0.5–1.9)

## 2022-04-28 LAB — PHOSPHORUS
Phosphorus: 1.6 mg/dL — ABNORMAL LOW (ref 2.5–4.6)
Phosphorus: 2.2 mg/dL — ABNORMAL LOW (ref 2.5–4.6)

## 2022-04-28 LAB — PROTIME-INR
INR: 1.5 — ABNORMAL HIGH (ref 0.8–1.2)
Prothrombin Time: 17.9 seconds — ABNORMAL HIGH (ref 11.4–15.2)

## 2022-04-28 MED ORDER — INSULIN GLARGINE-YFGN 100 UNIT/ML ~~LOC~~ SOLN
5.0000 [IU] | Freq: Every day | SUBCUTANEOUS | Status: DC
  Administered 2022-04-28: 5 [IU] via SUBCUTANEOUS
  Filled 2022-04-28 (×2): qty 0.05

## 2022-04-28 MED ORDER — THIAMINE MONONITRATE 100 MG PO TABS
100.0000 mg | ORAL_TABLET | Freq: Every day | ORAL | Status: DC
  Administered 2022-04-28 – 2022-04-29 (×2): 100 mg
  Filled 2022-04-28 (×2): qty 1

## 2022-04-28 MED ORDER — FOLIC ACID 1 MG PO TABS
1.0000 mg | ORAL_TABLET | Freq: Every day | ORAL | Status: DC
Start: 2022-04-28 — End: 2022-04-29
  Administered 2022-04-28 – 2022-04-29 (×2): 1 mg
  Filled 2022-04-28 (×2): qty 1

## 2022-04-28 MED ORDER — ENOXAPARIN SODIUM 80 MG/0.8ML IJ SOSY
1.0000 mg/kg | PREFILLED_SYRINGE | Freq: Two times a day (BID) | INTRAMUSCULAR | Status: DC
  Administered 2022-04-28 – 2022-04-29 (×3): 67.5 mg via SUBCUTANEOUS
  Filled 2022-04-28 (×3): qty 0.8

## 2022-04-28 MED ORDER — ADULT MULTIVITAMIN W/MINERALS CH
1.0000 | ORAL_TABLET | Freq: Every day | ORAL | Status: DC
  Administered 2022-04-28 – 2022-04-29 (×2): 1
  Filled 2022-04-28 (×2): qty 1

## 2022-04-28 MED ORDER — SODIUM CHLORIDE 0.9 % IV SOLN
100.0000 mg | Freq: Two times a day (BID) | INTRAVENOUS | Status: DC
  Administered 2022-04-28 – 2022-04-29 (×3): 100 mg via INTRAVENOUS
  Filled 2022-04-28 (×3): qty 100

## 2022-04-28 MED ORDER — POTASSIUM & SODIUM PHOSPHATES 280-160-250 MG PO PACK
1.0000 | PACK | Freq: Four times a day (QID) | ORAL | Status: AC
  Administered 2022-04-28 (×2): 1 via ORAL
  Filled 2022-04-28 (×2): qty 1

## 2022-04-28 MED ORDER — POTASSIUM PHOSPHATES 15 MMOLE/5ML IV SOLN
45.0000 mmol | Freq: Once | INTRAVENOUS | Status: AC
  Administered 2022-04-28: 45 mmol via INTRAVENOUS
  Filled 2022-04-28: qty 15

## 2022-04-28 MED ORDER — POTASSIUM CHLORIDE 20 MEQ PO PACK
20.0000 meq | PACK | Freq: Once | ORAL | Status: AC
  Administered 2022-04-28: 20 meq
  Filled 2022-04-28: qty 1

## 2022-04-28 MED ORDER — FREE WATER
100.0000 mL | Status: DC
  Administered 2022-04-28 – 2022-04-29 (×6): 100 mL

## 2022-04-28 NOTE — Progress Notes (Signed)
[facility] ADULT ICU REPLACEMENT PROTOCOL   The patient does apply for the [facility] Adult ICU Electrolyte Replacment Protocol based on the criteria listed below:   1.Exclusion criteria: TCTS patients, ECMO patients, and Dialysis patients 2. Is GFR >/= 30 ml/min? >  Patient's GFR today is >60 3. Is SCr </= 2? Yes.   Patient's SCr is 0.39 mg/dL 4. Did SCr increase >/= 0.5 in 24 hours? No. 5.Pt's weight >40kg  Yes.   6. Abnormal electrolyte(s): Phos 1.6, K+ 3.2  7. Electrolytes replaced per protocol 8.  Call MD STAT for K+ </= 2.5, Phos </= 1, or Mag </= 1 Physician:  Dr. Willaim Bane, Talbot Grumbling 04/28/2022 2:43 AM

## 2022-04-28 NOTE — IPAL (Signed)
  Interdisciplinary Goals of Care Family Meeting   Date carried out:: 04/28/2022  Location of the meeting: Phone conference  Member's involved: Physician, Bedside Registered Nurse, and Social Worker  Durable Power of Attorney or acting medical decision maker: DSS of [facility]    Discussion: We discussed goals of care for [employer] .  I received a phone call from the Department of Social Services with [facility] under the direction of Tomasita Morrow, Education officer, museum.  Ines Bloomer, RN participated in the phone conference as well on speaker phone.  DSS is in agreement with changing [place] to DNR.  They agreed to sign a durable DNR, [employer] form.  They would like to honor the family's wishes to allow them a reasonable amount of time to come visit.  According to her nurse, there are 8 siblings total and there is 1 sibling at least that the patient has not seen in about 20 years.  We are in agreement that a reasonable amount of time should be extended, but if family is not making a reasonable attempt it may not be feasible to wait for all family members to arrive.  We will continue to monitor the situation over the next few days.  Before a terminal extubation DSS would like to be notified again, but the plan is for eventual terminal extubation.  Social work and [employer] have both and updated.  Code status: Full DNR  Disposition: In-patient comfort care   Time spent for the meeting: 10 min.  Julian Hy 04/28/2022, 2:09 PM

## 2022-04-28 NOTE — Progress Notes (Signed)
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 Patient sent to the ED by patient's Facility due to elevated temp, low 02 sats, and increased congestion. Jennifer Pacheco was admitted to [facility] on 08.27.23 with Acute hypoxic respiratory failure, suspected aspiration PNA. Per Dr.Feldmann, ACC MD, this is a related hospital admission.    Visited Jennifer Pacheco at bedside. Patient not responsive to voice, spontaneous movement of right arm noted, eyes open, but does not appear to focus or track. Orally intubated, and oral NG tube present. Tube feeds hanging. Foley catheter draining small amount of dark urine. Patient's sister Janae Bridgeman and brother-in-law Lenward present at the bedside. Provided update and answered questions. Spoke with DSS representative Jaynee Eagles about goals of care. Code status changed to DNR and plan is to extubate patient and transition to comfort measures once family has a chance to come and visit patient. [place] hospice liaison will reach back out to sister tomorrow to determine what family will be coming to visit prior to extubation. If patient is stable, possible transfer to [facility].    Patient is inpatient appropriate due to hypoxic respiratory failure requiring mechanical ventilatory support and treatment of PNA with IV antibotics.    V/S:  108/80 bp,  98.0 Temp, 85 bpm, 19 RR, 92% on Ventilator Fi02 '@40'$ %   I&O: 1807.4/160   Labs:  BASIC METABOLIC PANEL: Sodium: 287 (H) Potassium: 3.2 (L) Chloride: 115 (H) Glucose: 209 (H) BUN: 29 (H) Creatinine: 0.39 (L) Calcium: 8.8 (L) Phosphorus: 1.6 (L) WBC: 24.7 (H) Prothrombin Time: 17.9 (H) INR: 1.5 (H)   Diagnostics: None today  IV/PRN:  potassium PHOSPHATE 45 mmol in dextrose 5 % 500 mL infusion Once, IV x7 metroNIDAZOLE (FLAGYL) IVPB 500 mg,  q12 hrs, IV x2 fentaNYL 2582mg in  NS 2546m(1067mml) infusion-PREMIX 2.5-20 mL/hr, 25-200 mcg/hr, Continuous, IV x10 doxycycline (VIBRAMYCIN) 100 mg in sodium chloride 0.9 % 250 mL IVPB  q12 hrs, IV x3 aztreonam (AZACTAM) 2 g in sodium chloride 0.9 % 100 mL IVPB Q8hrs, IV, x1  Problem list: Assessment & plan: Acute respiratory failure due to aspiration pneumonia-bilateral lower lobes - Continue aztreonam - LTVV - VAP prevention protocol - PAD protocol for sedation - Daily SAT SBT.  Need to clarify goals of care and CODE STATUS prior to extubation.  Ongoing discussions between her outpatient hospice organization, palliative care, and [place] are her designated court appointed guardians.   Staph epi UTI - Add doxycycline   History of pulmonary embolism with bleeding on anticoagulation - Okay to start Lovenox today   Acute blood loss anemia previously on anticoagulation-stable - Monitor H&H - No indication for transfusion currently   Coagulopathy due to sepsis, improved today - Okay to start Lovenox - Monitor   Hyperglycemia - Sliding scale insulin   Refeeding syndrome-hypokalemia, hypocalcemia, hypophosphatemia - Supplemental vitamins - Aggressive electrolyte repletion   Hypernatremia - Continue to monitor - Additional free water   GOC: Spoke with DSS representative MicGuerry Minorsout goals of care. Code status changed to DNR.        D/C planning: Ongoing   Family: Spoke with patient's sister and brother-in-law, present at bedside. Provided update and answered questions.    IDT: Updated   Medication list and Transfer Summary placed on Shadow Chart.    Should patient need ambulance transfer - Please  use [facility]) as they contract this service for our active hospice patients.        Zigmund Gottron, RN [facility] Liaison  [phone]

## 2022-04-28 NOTE — Progress Notes (Signed)
NAME:  Jennifer Pacheco, MRN:  [id number], DOB:  [birth year], LOS: 2 ADMISSION DATE:  04/26/2022, CONSULTATION DATE:  8/27 REFERRING MD:  Dr. Truett Pacheco, CHIEF COMPLAINT:  acute resp failure   History of Present Illness:  Patient is a 66 year old female with pertinent PMH of HTN, DMT2, brain tumor (meningioma) with mass effect and vasogenic edema, distal esophageal thickening, recent PE with IVC filter placement on Eliquis presents to [facility] ED on 8/27 with AMS.  Patient was recently admitted to [facility] ED 01/30/2022 and transferred to [facility] with AMS.  Patient was hypoxic/hypotensive.  Patient intubated and started on pressors.  CT chest showing submassive PE and was started on heparin.  Broad-spectrum antibiotics started.  CT abdomen and UA concerning for UTI with possible pyelonephritis.  CT head showing large mass in R basal ganglia with surrounding edema; mass effect with near complete effacement of R lateral ventricle and an 11 mm leftward shift.  NSG was consulted and started patient on Decadron.  Throughout stay patient experienced ABLA due to retroperitoneal bleed.  Heparin was stopped and IVC filter placed.  Patient then developed DVT in LLE on 7/5.  Vascular rechallenge patient with IV heparin and transition patient to Eliquis without blood loss. APS involved for guardianship and Jennifer Pacheco was appointed interim guardian.  Palliative care was following. Patient was discharged on 04/01/2022 to [facility] [place].  Patient was to follow-up with NSG in 1 month and follow-up with GI for possible endoscopy Per NSG patient was weaned off dexamethasone 1 week after discharge.  On 8/27 staff at [facility] state patient was having respite tori distress and AMS.  Concern patient was eating and should not be with dysphagia. Patient transported to [facility] ED for further eval.  Upon arrival to [facility] ED on 8/27, patient hypoxic with improvement on NRB.  Vital stable.  GCS 6.  ED physician intubated due to  hypoxia and low GCS.  Tmax 102 F and WBC 23.  UA indicative of UTI and CXR showing small lower lobe consolidation L>R. Started on Levaquin, Flagyl, bank (anaphylaxis to penicillin).  Glucose 211.  ABG WNL postintubation.  LA 2.9.  NA 149.  Troponin 66 and BNP 159.  CT head, ABD/pelvis, PE chest pending.  Other labs pending.  PCCM consulted for ICU admission.  Pertinent  Medical History   HTN, DMT2, brain tumor (meningioma) with mass effect and vasogenic edema, distal esophageal thickening, recent PE with IVC filter placement on Eliquis  Significant Hospital Events: Including procedures, antibiotic start and stop dates in addition to other pertinent events   8/27 admitted to [facility]; intubated  Interim History / Subjective:  Intubated on vent, afebrile overnight, more responsive/pushing hands away during exam  Objective   Blood pressure 109/82, pulse 93, temperature 98 F (36.7 C), temperature source Oral, resp. rate (!) 24, height '5\' 5"'$  (1.651 m), weight 67.3 kg, SpO2 93 %.    Vent Mode: PSV;CPAP FiO2 (%):  [40 %] 40 % Set Rate:  [18 bmp] 18 bmp Vt Set:  [450 mL] 450 mL PEEP:  [5 cmH20] 5 cmH20 Pressure Support:  [8 cmH20-10 cmH20] 8 cmH20 Plateau Pressure:  [15 cmH20-18 cmH20] 15 cmH20   Intake/Output Summary (Last 24 hours) at 04/28/2022 0928 Last data filed at 04/28/2022 0900 Gross per 24 hour  Intake 1939.3 ml  Output 125 ml  Net 1814.3 ml   Filed Weights   04/26/22 2339 04/27/22 0500 04/28/22 0500  Weight: 62.4 kg 62.4 kg 67.3 kg  Examination: General:  ill appearing on mech vent HEENT: ETT in place, PERRL, clear  Neuro: Responds to name/voice; forcibly keeping eyes closed, not moving left extremities.  CV: s1s2, no m/r/g PULM:  Coarse breath sounds; on mech vent PRVC GI: soft, NTTP, non-distended  Extremities: warm/dry, LRE and URE edema.   Resolved Hospital Problem list     Assessment & Plan:  Acute respiratory failure with hypoxia Aspiration pneumonia WBC  trending down 24.7 (27.2) today, afebrile overnight. Lactic acid resolve. Considering extubation today. P: -LTVV strategy with tidal volumes of 6-8 cc/kg ideal body weight -Wean PEEP/FiO2 for SpO2 >92% -VAP bundle in place -Daily SAT and SBT -PAD protocol in place -wean sedation for RASS goal 0 to -1 -abx (aztreonam, flagyl)  Acute encephalopathy:   Hx of meningioma with mass effect and vasogenic edema:  -8/27 CT head stable appearing Large R meningioma in frontotemproal region w/ significant midline shift; no new focal abnormality. AMS likely 2/2 acute illness.  P: -continue antibiotics -Limit sedating meds -As needed fentanyl for RASS 0 to -1 -f/u outpt with NSG  Sepsis: recently admitted 01/2022 with UTI w/ pyelonephritis Staph Epi UTI Aspiration pneumonia Multifocal PNA on CT Chest. UA positive. Ucx w/ 100,000 gram positive cocci. Afebrile overnight, on WBC trending down, LA normalized. Bcx , trach aspirate pending.   P:  -Start Doxycycline for UTI -F/u BC x2, trach aspirate -start aztreonam, continue flagyl -continue IV fluid -Trend WBC/fever curve  Refeeding syndrome Patient with low Ca, K, Phos, concerning for refeeding syndrome w/ hx concerning for possible limited access to food/decreased drive to eat.  -Thiamin -Folic acid -Repelete electrolytes as needed   Hyperglycemia DMT2 CBG range 170's-200's, 14 units short acting given. Will continue to monitor. P: -5 units semglee  -SSI and CBG monitoring -Consider tube feed coverage   Hypernatremia  Hypokalemia Na 146 today, K 3.2 this am. 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 BNP 159.2, trop flat. LA trending down. No PE on CT chest. INR trending down. Patient will need 3 months AC tx.  P: -AM INR -Start Lovenox for PE  tx   Jennifer Pacheco Patient deamed incompetent; APS was involved for guardianship and Jennifer Pacheco was appointed interim guardian. Palliative care following and attempting to change patient code status.  P: -GOC discussion with guardian DSS -f/u palliative   Hx of esophageal thickening P: -further f/u outpt GI; will need endoscopy -continue PPI   Best Practice (right click and "Reselect all SmartList Selections" daily)   Diet/type: tubefeeds DVT prophylaxis: LMWH  GI prophylaxis: PPI Lines: N/A Foley:  Yes, and it is still needed Code Status:  full code Last date of multidisciplinary goals of care discussion  Labs   CBC: Recent Labs  Lab 04/26/22 2019 04/26/22 2025 04/26/22 2144 04/27/22 0103 04/28/22 0048  WBC  --  23.0*  --  27.2* 24.7*  NEUTROABS  --  21.6*  --   --   --   HGB 16.7*  17.0* 16.8* 13.6 14.7 13.5  HCT 49.0*  50.0* 52.5* 40.0 47.8* 41.5  MCV  --  90.7  --  92.8 89.4  PLT  --  430*  --  318 469    Basic Metabolic Panel: Recent Labs  Lab 04/26/22 2019 04/26/22 2025 04/26/22 2144 04/26/22 2250 04/27/22 0103 04/28/22 0048  NA 146*  147* 149* 147*  --  144 146*  K 4.0  4.0 4.0 2.7*  --  2.8* 3.2*  CL 114* 115*  --   --  114* 115*  CO2  --  19*  --   --  19* 23  GLUCOSE 211* 211*  --   --  229* 209*  BUN 31* 24*  --   --  21 29*  CREATININE 0.50 1.00  --   --  0.66 0.39*  CALCIUM  --  9.0  --   --  8.5* 8.8*  MG  --   --   --  1.7  --  2.0  PHOS  --   --   --   --   --  1.6*   GFR: Estimated Creatinine Clearance: 63.1 mL/min (A) (by C-G formula based on SCr of 0.39 mg/dL (L)). Recent Labs  Lab 04/26/22 2024 04/26/22 2025 04/26/22 2250 04/27/22 0103 04/28/22 0048  PROCALCITON  --   --  0.26  --   --   WBC  --  23.0*  --  27.2* 24.7*  LATICACIDVEN 2.9*  --  3.0* 2.6* 1.5    Liver Function Tests: Recent Labs  Lab 04/26/22 2025  AST 14*  ALT 22  ALKPHOS 69  BILITOT 1.5*  PROT 6.4*  ALBUMIN 2.6*   No results for input(s):  "LIPASE", "AMYLASE" in the last 168 hours. Recent Labs  Lab 04/27/22 0103  AMMONIA 23    ABG    Component Value Date/Time   PHART 7.432 04/26/2022 2144   PCO2ART 32.6 04/26/2022 2144   PO2ART 290 (H) 04/26/2022 2144   HCO3 21.8 04/26/2022 2144   TCO2 23 04/26/2022 2144   ACIDBASEDEF 2.0 04/26/2022 2144   O2SAT 100 04/26/2022 2144     Coagulation Profile: Recent Labs  Lab 04/26/22 2025 04/27/22 0713 04/28/22 0048  INR 2.3* 2.7* 1.5*    Cardiac Enzymes: No results for input(s): "CKTOTAL", "CKMB", "CKMBINDEX", "TROPONINI" in the last 168 hours.  HbA1C: Hgb A1c MFr Bld  Date/Time Value Ref Range Status  01/30/2022 11:15 PM 5.5 4.8 - 5.6 % Final    Comment:    (NOTE) Pre diabetes:          5.7%-6.4%  Diabetes:              >6.4%  Glycemic control for   <7.0% adults with diabetes     CBG: Recent Labs  Lab 04/27/22 1549 04/27/22 1912 04/27/22 2307 04/28/22 0316 04/28/22 0749  GLUCAP 195* 171* 195* 207* 238*    Review of Systems:   Patient is encephalopathic and/or intubated. Therefore history has been obtained from chart review.    Past Medical History:  She,  has a past medical history of Fall and Hard of hearing.   Surgical History:   Past Surgical History:  Procedure Laterality Date   BREAST BIOPSY Left 2020   beg   IR IVC FILTER PLMT / S&I /IMG GUID/MOD SED  02/02/2022     Social History:   reports that she has never smoked. She has never used smokeless tobacco. She reports that she does not currently use alcohol. She reports current drug use. Drug: Marijuana.   Family History:  Her family history includes Breast cancer in her maternal aunt.   Allergies Allergies  Allergen Reactions   Penicillins Anaphylaxis     Home Medications  Prior to Admission medications   Medication Sig Start Date End Date Taking? Authorizing Provider  apixaban (ELIQUIS) 5 MG TABS tablet Take 1 tablet (5 mg total) by mouth 2 (two) times daily. 04/01/22 05/01/22   Manuella Ghazi, Pratik D, DO  calcium carbonate (TUMS - DOSED IN MG ELEMENTAL CALCIUM) 500 MG chewable tablet Chew 1 tablet (200 mg of elemental calcium total) by mouth 3 (three) times daily as needed for indigestion or heartburn. 04/01/22   Manuella Ghazi, Pratik D, DO  gabapentin (NEURONTIN) 300 MG capsule Take 1 capsule (300 mg total) by mouth 2 (two) times daily. 04/01/22 05/01/22  Manuella Ghazi, Pratik D, DO  pantoprazole (PROTONIX) 40 MG tablet Take 1 tablet (40 mg total) by mouth 2 (two) times daily. 04/01/22 05/01/22  Heath Lark D, DO     Critical care time: 45 minutes    Holley Bouche, MD PGY-2 [facility] 04/28/2022, 9:28 AM  Please see [email or web address] for pager details.  From 7A-7P if no response, please call [phone]. After hours, please call ELink [phone].

## 2022-04-28 NOTE — Inpatient Diabetes Management (Signed)
Inpatient Diabetes Program Recommendations  AACE/ADA: New Consensus Statement on Inpatient Glycemic Control (2015)  Target Ranges:  Prepandial:   less than 140 mg/dL      Peak postprandial:   less than 180 mg/dL (1-2 hours)      Critically ill patients:  140 - 180 mg/dL   Lab Results  Component Value Date   GLUCAP 238 (H) 04/28/2022   HGBA1C 5.5 01/30/2022    Review of Glycemic Control  Latest Reference Range & Units 04/27/22 19:12 04/27/22 23:07 04/28/22 03:16 04/28/22 07:49  Glucose-Capillary 70 - 99 mg/dL 171 (H) 195 (H) 207 (H) 238 (H)   Diabetes history: None noted Current orders for Inpatient glycemic control:  Novolog 0-9 units q 4 hours  Inpatient Diabetes Program Recommendations:    Consider adding Novolog 2 units q 4 hours for tube feed coverage.   Thanks,  Adah Perl, RN, BC-ADM Inpatient Diabetes Coordinator Pager [phone]  (8a-5p)

## 2022-04-29 DIAGNOSIS — J690 Pneumonitis due to inhalation of food and vomit: Secondary | ICD-10-CM

## 2022-04-29 DIAGNOSIS — D332 Benign neoplasm of brain, unspecified: Secondary | ICD-10-CM

## 2022-04-29 DIAGNOSIS — J9601 Acute respiratory failure with hypoxia: Secondary | ICD-10-CM | POA: Diagnosis not present

## 2022-04-29 DIAGNOSIS — Z515 Encounter for palliative care: Secondary | ICD-10-CM

## 2022-04-29 DIAGNOSIS — D329 Benign neoplasm of meninges, unspecified: Secondary | ICD-10-CM | POA: Diagnosis not present

## 2022-04-29 DIAGNOSIS — N3000 Acute cystitis without hematuria: Secondary | ICD-10-CM | POA: Diagnosis not present

## 2022-04-29 LAB — PROTIME-INR
INR: 1.5 — ABNORMAL HIGH (ref 0.8–1.2)
Prothrombin Time: 17.5 seconds — ABNORMAL HIGH (ref 11.4–15.2)

## 2022-04-29 LAB — GLUCOSE, CAPILLARY
Glucose-Capillary: 260 mg/dL — ABNORMAL HIGH (ref 70–99)
Glucose-Capillary: 221 mg/dL — ABNORMAL HIGH (ref 70–99)

## 2022-04-29 LAB — PHOSPHORUS: Phosphorus: 2 mg/dL — ABNORMAL LOW (ref 2.5–4.6)

## 2022-04-29 LAB — MAGNESIUM: Magnesium: 1.7 mg/dL (ref 1.7–2.4)

## 2022-04-29 MED ORDER — GLYCOPYRROLATE 0.2 MG/ML IJ SOLN: 0.2000 mg | INTRAMUSCULAR | Status: DC | PRN

## 2022-04-29 MED ORDER — MAGNESIUM SULFATE 2 GM/50ML IV SOLN
2.0000 g | Freq: Once | INTRAVENOUS | Status: AC
  Administered 2022-04-29: 2 g via INTRAVENOUS
  Filled 2022-04-29: qty 50

## 2022-04-29 MED ORDER — GLYCOPYRROLATE 1 MG PO TABS: 1.0000 mg | ORAL_TABLET | ORAL | Status: DC | PRN

## 2022-04-29 MED ORDER — ACETAMINOPHEN 325 MG PO TABS: 650.0000 mg | ORAL_TABLET | Freq: Four times a day (QID) | ORAL | Status: DC | PRN

## 2022-04-29 MED ORDER — SODIUM CHLORIDE 0.9 % IV SOLN: INTRAVENOUS | Status: DC

## 2022-04-29 MED ORDER — ACETAMINOPHEN 325 MG PO TABS
650.0000 mg | ORAL_TABLET | Freq: Four times a day (QID) | ORAL | Status: DC | PRN
Start: 2022-04-29 — End: 2022-04-29

## 2022-04-29 MED ORDER — MIDAZOLAM HCL 2 MG/2ML IJ SOLN: 2.0000 mg | INTRAMUSCULAR | Status: DC | PRN

## 2022-04-29 MED ORDER — ACETAMINOPHEN 650 MG RE SUPP: 650.0000 mg | Freq: Four times a day (QID) | RECTAL | Status: DC | PRN

## 2022-04-29 MED ORDER — ONDANSETRON HCL 4 MG/2ML IJ SOLN: 4.0000 mg | Freq: Four times a day (QID) | INTRAMUSCULAR | Status: DC | PRN

## 2022-04-29 MED ORDER — POLYVINYL ALCOHOL 1.4 % OP SOLN: 1.0000 [drp] | Freq: Four times a day (QID) | OPHTHALMIC | Status: DC | PRN

## 2022-04-29 MED ORDER — SODIUM CHLORIDE 3 % IN NEBU: 4.0000 mL | INHALATION_SOLUTION | RESPIRATORY_TRACT | Status: DC

## 2022-04-29 MED ORDER — DIPHENHYDRAMINE HCL 50 MG/ML IJ SOLN: 25.0000 mg | INTRAMUSCULAR | Status: DC | PRN

## 2022-04-29 MED ORDER — FENTANYL BOLUS VIA INFUSION: 100.0000 ug | INTRAVENOUS | Status: DC | PRN

## 2022-04-29 MED ORDER — FENTANYL 2500MCG IN NS 250ML (10MCG/ML) PREMIX INFUSION: 0.0000 ug/h | INTRAVENOUS | Status: DC

## 2022-04-29 MED ORDER — ONDANSETRON 4 MG PO TBDP: 4.0000 mg | ORAL_TABLET | Freq: Four times a day (QID) | ORAL | Status: DC | PRN

## 2022-04-29 NOTE — Procedures (Signed)
Extubation Procedure Note  Patient Details:   Name: Stacyann Mcconaughy DOB: 1955/11/16 MRN: [id number]   Airway Documentation:    Vent end date: 04/29/22 Vent end time: 1415   Evaluation  O2 sats: stable throughout Complications: No apparent complications Patient did tolerate procedure well. Bilateral Breath Sounds: Clear, Diminished   Yes   Patient was extubated one way to nasal cannula for comfort. No distress. RN at bedside. Vitals stable. No stridor.  Correy Weidner M 04/29/2022, 2:33 PM

## 2022-04-29 NOTE — Progress Notes (Signed)
Patient ID: [name], female   DOB: [birth year], 66 y.o.   MRN: [id number]    Progress Note from the Palliative Medicine Team at [facility]   Patient Name: Jennifer Pacheco        Date: 04/29/2022 DOB: 1956-07-29  Age: 66 y.o. MRN#: [id number] Attending Physician: Julian Hy, DO Primary Care Physician: [name], NP Admit Date: 04/26/2022   Medical records reviewed, discussed  with treatment team and hospice liaison Margaretmary Eddy RN   Patient is under services with a [facility].    Olivia Mackie will handle communication between critical care,DSS and family.  PMT will sign off at this time  Discussed with Dr. Carlis Abbott  No charge  Wadie Lessen NP  Palliative Medicine Team Team Phone # [phone] Pager [phone]

## 2022-04-29 NOTE — Progress Notes (Signed)
NAME:  Jennifer Pacheco, MRN:  [id number], DOB:  12/03/1955, LOS: 3 ADMISSION DATE:  04/26/2022, CONSULTATION DATE:  8/27 REFERRING MD:  Dr. Truett Mainland, CHIEF COMPLAINT:  acute resp failure   History of Present Illness:  Patient is a 66 year old female with pertinent PMH of HTN, DMT2, brain tumor (meningioma) with mass effect and vasogenic edema, distal esophageal thickening, recent PE with IVC filter placement on Eliquis presents to [facility] ED on 8/27 with AMS.  Patient was recently admitted to [facility] ED 01/30/2022 and transferred to [facility] with AMS.  Patient was hypoxic/hypotensive.  Patient intubated and started on pressors.  CT chest showing submassive PE and was started on heparin.  Broad-spectrum antibiotics started.  CT abdomen and UA concerning for UTI with possible pyelonephritis.  CT head showing large mass in R basal ganglia with surrounding edema; mass effect with near complete effacement of R lateral ventricle and an 11 mm leftward shift.  NSG was consulted and started patient on Decadron.  Throughout stay patient experienced ABLA due to retroperitoneal bleed.  Heparin was stopped and IVC filter placed.  Patient then developed DVT in LLE on 7/5.  Vascular rechallenge patient with IV heparin and transition patient to Eliquis without blood loss. APS involved for guardianship and [place] was appointed interim guardian.  Palliative care was following. Patient was discharged on 04/01/2022 to [facility], [place].  Patient was to follow-up with NSG in 1 month and follow-up with GI for possible endoscopy Per NSG patient was weaned off dexamethasone 1 week after discharge.  On 8/27 staff at [facility] state patient was having respite tori distress and AMS.  Concern patient was eating and should not be with dysphagia. Patient transported to [facility] ED for further eval.  Upon arrival to [facility] ED on 8/27, patient hypoxic with improvement on NRB.  Vital stable.  GCS 6.  ED physician intubated due to  hypoxia and low GCS.  Tmax 102 F and WBC 23.  UA indicative of UTI and CXR showing small lower lobe consolidation L>R. Started on Levaquin, Flagyl, bank (anaphylaxis to penicillin).  Glucose 211.  ABG WNL postintubation.  LA 2.9.  NA 149.  Troponin 66 and BNP 159.  PCCM consulted for ICU admission.  Pertinent  Medical History   HTN, DMT2, brain tumor (meningioma) with mass effect and vasogenic edema, distal esophageal thickening, recent PE with IVC filter placement on Eliquis  Significant Hospital Events: Including procedures, antibiotic start and stop dates in addition to other pertinent events   8/27 admitted to [facility]; intubated 8/29 Made DNR  Interim History / Subjective:  Continues to be afebrile overnight, with increasing interaction. Made DNR yesterday and likely to be made comfort care today.   Objective   Blood pressure 112/86, pulse 80, temperature 98.9 F (37.2 C), temperature source Oral, resp. rate 18, height '5\' 5"'$  (1.651 m), weight 67.3 kg, SpO2 97 %.    Vent Mode: PRVC FiO2 (%):  [40 %] 40 % Set Rate:  [18 bmp] 18 bmp Vt Set:  [450 mL] 450 mL PEEP:  [5 cmH20] 5 cmH20 Pressure Support:  [8 cmH20] 8 cmH20 Plateau Pressure:  [12 cmH20-17 cmH20] 15 cmH20   Intake/Output Summary (Last 24 hours) at 04/29/2022 0739 Last data filed at 04/29/2022 0701 Gross per 24 hour  Intake 1856.47 ml  Output 500 ml  Net 1356.47 ml    Filed Weights   04/26/22 2339 04/27/22 0500 04/28/22 0500  Weight: 62.4 kg 62.4 kg 67.3 kg  Examination: General:  ill appearing on mech vent HEENT: ETT in place, PERRL, clear  Neuro: Responds to name/voice; moving RUE and RLE w/ commands. Not moving LUE or LLE.  CV: s1s2, no m/r/g PULM:  Coarse breath sounds bilaterally; on mech vent GI: soft, NTTP, non-distended  Extremities: warm/dry, LLE and ULE with pitting edema.   Resolved Hospital Problem list     Assessment & Plan:   [place] Patient deamed incompetent; APS was involved for guardianship  and [place] was appointed interim guardian. Patient made DNR yesterday with [place] conversation to extubate to allow patient to pass. If passing longer than anticipated, patient can go to [facility] if necessary.  P: -Patient to be made comfort care today  Acute respiratory failure with hypoxia Aspiration pneumonia Afebrile overnight. Waiting for extubation to allow time for family to see patient. Patient to be made comfort care today. P: -LTVV strategy with tidal volumes of 6-8 cc/kg ideal body weight -Wean PEEP/FiO2 for SpO2 >92% -VAP bundle in place -PAD protocol in place -wean sedation for RASS goal 0 to -1 -abx (aztreonam, flagyl), until made comfort care  Acute encephalopathy:   Hx of meningioma with mass effect and vasogenic edema:  -8/27 CT head stable appearing Large R meningioma in frontotemproal region w/ significant midline shift; no new focal abnormality. Mental status improved today, following commands and opening eyes to voice. Patient to be made comfort care today.  P: -continue antibiotics until made comfort care -Limit sedating meds -As needed fentanyl for RASS 0 to -1  Sepsis: recently admitted 01/2022 with UTI w/ pyelonephritis Staph Epi UTI Aspiration pneumonia Multifocal PNA on CT Chest. UA positive. Ucx w/ Staph Epi. Afebrile overnight. Patient to be made comfort care, will continue abx until care is withdrawn. P:  -Doxycycline for UTI -Aztreonam, flagyl for PNA -continue IV fluid  Refeeding syndrome Patient with low Ca, K, Phos, concerning for refeeding syndrome w/ hx concerning for possible limited access to food/decreased drive to eat. Phos continue trending down today. Will continue to trend labs and replete, until made comfort care. P: -Thiamin -Folic acid -Repelete electrolytes as needed -Daily BMP until made comfort care   Hyperglycemia DMT2 CBG range 170's-200's, 19 units short acting given. Will continue to monitor. Patient to be  made comfort care at which point cbg checks will stop.  P: -10 units semglee  -SSI and CBG monitoring until made comfort care   Hypernatremia  Hypokalemia Patient to be made comfort care today, at which point monitoring will stop.  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 BNP 159.2, trop flat. LA trending down. No PE on CT chest. INR trending down. Patient will need 3 months AC tx. Patient to be made comfort care today. Will continue lovenox until then.  P: -AM INR -Continue Lovenox for PE tx  Hx of esophageal thickening P: -further f/u outpt GI; will need endoscopy -continue PPI   Best Practice (right click and "Reselect all SmartList Selections" daily)   Diet/type: tubefeeds DVT prophylaxis: LMWH  GI prophylaxis: PPI Lines: N/A Foley:  Yes, and it is still needed Code Status:  full code Last date of multidisciplinary goals of care discussion  Labs   CBC: Recent Labs  Lab 04/26/22 2019 04/26/22 2025 04/26/22 2144 04/27/22 0103 04/28/22 0048  WBC  --  23.0*  --  27.2* 24.7*  NEUTROABS  --  21.6*  --   --   --   HGB 16.7*  17.0* 16.8* 13.6 14.7 13.5  HCT 49.0*  50.0* 52.5* 40.0 47.8* 41.5  MCV  --  90.7  --  92.8 89.4  PLT  --  430*  --  318 335     Basic Metabolic Panel: Recent Labs  Lab 04/26/22 2019 04/26/22 2025 04/26/22 2144 04/26/22 2250 04/27/22 0103 04/28/22 0048 04/28/22 2006 04/29/22 0138  NA 146*  147* 149* 147*  --  144 146*  --   --   K 4.0  4.0 4.0 2.7*  --  2.8* 3.2*  --   --   CL 114* 115*  --   --  114* 115*  --   --   CO2  --  19*  --   --  19* 23  --   --   GLUCOSE 211* 211*  --   --  229* 209*  --   --   BUN 31* 24*  --   --  21 29*  --   --   CREATININE 0.50 1.00  --   --  0.66 0.39*  --   --   CALCIUM  --  9.0  --   --  8.5* 8.8*  --   --   MG  --   --   --  1.7  --  2.0   --  1.7  PHOS  --   --   --   --   --  1.6* 2.2* 2.0*    GFR: Estimated Creatinine Clearance: 63.1 mL/min (A) (by C-G formula based on SCr of 0.39 mg/dL (L)). Recent Labs  Lab 04/26/22 2024 04/26/22 2025 04/26/22 2250 04/27/22 0103 04/28/22 0048  PROCALCITON  --   --  0.26  --   --   WBC  --  23.0*  --  27.2* 24.7*  LATICACIDVEN 2.9*  --  3.0* 2.6* 1.5     Liver Function Tests: Recent Labs  Lab 04/26/22 2025  AST 14*  ALT 22  ALKPHOS 69  BILITOT 1.5*  PROT 6.4*  ALBUMIN 2.6*    No results for input(s): "LIPASE", "AMYLASE" in the last 168 hours. Recent Labs  Lab 04/27/22 0103  AMMONIA 23     ABG    Component Value Date/Time   PHART 7.432 04/26/2022 2144   PCO2ART 32.6 04/26/2022 2144   PO2ART 290 (H) 04/26/2022 2144   HCO3 21.8 04/26/2022 2144   TCO2 23 04/26/2022 2144   ACIDBASEDEF 2.0 04/26/2022 2144   O2SAT 100 04/26/2022 2144     Coagulation Profile: Recent Labs  Lab 04/26/22 2025 04/27/22 0713 04/28/22 0048 04/29/22 0138  INR 2.3* 2.7* 1.5* 1.5*     Cardiac Enzymes: No results for input(s): "CKTOTAL", "CKMB", "CKMBINDEX", "TROPONINI" in the last 168 hours.  HbA1C: Hgb A1c MFr Bld  Date/Time Value Ref Range Status  01/30/2022 11:15 PM 5.5 4.8 - 5.6 % Final    Comment:    (NOTE) Pre diabetes:          5.7%-6.4%  Diabetes:              >6.4%  Glycemic control for   <7.0% adults with diabetes     CBG: Recent Labs  Lab 04/28/22 1529 04/28/22 1916 04/28/22 2310 04/29/22 0314 04/29/22 0726  GLUCAP 201* 203* 234* 260* 221*     Review of Systems:   Patient is encephalopathic and/or  intubated. Therefore history has been obtained from chart review.    Past Medical History:  She,  has a past medical history of Fall and Hard of hearing.   Surgical History:   Past Surgical History:  Procedure Laterality Date   BREAST BIOPSY Left 2020   beg   IR IVC FILTER PLMT / S&I /IMG GUID/MOD SED  02/02/2022     Social History:    reports that she has never smoked. She has never used smokeless tobacco. She reports that she does not currently use alcohol. She reports current drug use. Drug: Marijuana.   Family History:  Her family history includes Breast cancer in her maternal aunt.   Allergies Allergies  Allergen Reactions   Penicillins Anaphylaxis     Home Medications  Prior to Admission medications   Medication Sig Start Date End Date Taking? Authorizing Provider  apixaban (ELIQUIS) 5 MG TABS tablet Take 1 tablet (5 mg total) by mouth 2 (two) times daily. 04/01/22 05/01/22  Manuella Ghazi, Pratik D, DO  calcium carbonate (TUMS - DOSED IN MG ELEMENTAL CALCIUM) 500 MG chewable tablet Chew 1 tablet (200 mg of elemental calcium total) by mouth 3 (three) times daily as needed for indigestion or heartburn. 04/01/22   Manuella Ghazi, Pratik D, DO  gabapentin (NEURONTIN) 300 MG capsule Take 1 capsule (300 mg total) by mouth 2 (two) times daily. 04/01/22 05/01/22  Manuella Ghazi, Pratik D, DO  pantoprazole (PROTONIX) 40 MG tablet Take 1 tablet (40 mg total) by mouth 2 (two) times daily. 04/01/22 05/01/22  Heath Lark D, DO     Critical care time: 4 minutes    Holley Bouche, MD PGY-2 [facility] 04/29/2022, 7:39 AM  Please see [email or web address] for pager details.  From 7A-7P if no response, please call [phone]. After hours, please call ELink [phone].

## 2022-04-29 NOTE — Progress Notes (Signed)
Pharmacy Electrolyte Replacement  Recent Labs:  Recent Labs    04/28/22 0048 04/28/22 2006 04/29/22 0138  K 3.2*  --   --   MG 2.0  --  1.7  PHOS 1.6*   < > 2.0*  CREATININE 0.39*  --   --    < > = values in this interval not displayed.    Low Critical Values (K </= 2.5, Phos </= 1, Mg </= 1) Present: Mg = 1.7  MD Contacted: n/a  Plan:  Magnesium 2g IV x1    Gena Fray, PharmD PGY1 Pharmacy Resident   04/29/2022 6:16 AM

## 2022-04-29 NOTE — Plan of Care (Signed)
Patient discharge to hospice.  Problem: Education: Goal: Ability to describe self-care measures that may prevent or decrease complications (Diabetes Survival Skills Education) will improve 04/29/2022 1639 by Unknown Jim, RN Outcome: Adequate for Discharge 04/29/2022 1639 by Unknown Jim, RN Outcome: Adequate for Discharge Goal: Individualized Educational Video(s) 04/29/2022 1639 by Unknown Jim, RN Outcome: Adequate for Discharge 04/29/2022 1639 by Unknown Jim, RN Outcome: Adequate for Discharge   Problem: Coping: Goal: Ability to adjust to condition or change in health will improve 04/29/2022 1639 by Unknown Jim, RN Outcome: Adequate for Discharge 04/29/2022 1639 by Unknown Jim, RN Outcome: Adequate for Discharge   Problem: Fluid Volume: Goal: Ability to maintain a balanced intake and output will improve 04/29/2022 1639 by Unknown Jim, RN Outcome: Adequate for Discharge 04/29/2022 1639 by Unknown Jim, RN Outcome: Adequate for Discharge   Problem: Health Behavior/Discharge Planning: Goal: Ability to identify and utilize available resources and services will improve 04/29/2022 1639 by Unknown Jim, RN Outcome: Adequate for Discharge 04/29/2022 1639 by Unknown Jim, RN Outcome: Adequate for Discharge Goal: Ability to manage health-related needs will improve 04/29/2022 1639 by Unknown Jim, RN Outcome: Adequate for Discharge 04/29/2022 1639 by Unknown Jim, RN Outcome: Adequate for Discharge   Problem: Metabolic: Goal: Ability to maintain appropriate glucose levels will improve 04/29/2022 1639 by Unknown Jim, RN Outcome: Adequate for Discharge 04/29/2022 1639 by Unknown Jim, RN Outcome: Adequate for Discharge   Problem: Nutritional: Goal: Maintenance of adequate nutrition will improve 04/29/2022 1639 by Unknown Jim, RN Outcome: Adequate for Discharge 04/29/2022 1639 by Unknown Jim, RN Outcome:  Adequate for Discharge Goal: Progress toward achieving an optimal weight will improve 04/29/2022 1639 by Unknown Jim, RN Outcome: Adequate for Discharge 04/29/2022 1639 by Unknown Jim, RN Outcome: Adequate for Discharge   Problem: Skin Integrity: Goal: Risk for impaired skin integrity will decrease 04/29/2022 1639 by Unknown Jim, RN Outcome: Adequate for Discharge 04/29/2022 1639 by Unknown Jim, RN Outcome: Adequate for Discharge   Problem: Tissue Perfusion: Goal: Adequacy of tissue perfusion will improve 04/29/2022 1639 by Unknown Jim, RN Outcome: Adequate for Discharge 04/29/2022 1639 by Unknown Jim, RN Outcome: Adequate for Discharge   Problem: Education: Goal: Knowledge of General Education information will improve Description: Including pain rating scale, medication(s)/side effects and non-pharmacologic comfort measures 04/29/2022 1639 by Unknown Jim, RN Outcome: Adequate for Discharge 04/29/2022 1639 by Unknown Jim, RN Outcome: Adequate for Discharge   Problem: Health Behavior/Discharge Planning: Goal: Ability to manage health-related needs will improve 04/29/2022 1639 by Unknown Jim, RN Outcome: Adequate for Discharge 04/29/2022 1639 by Unknown Jim, RN Outcome: Adequate for Discharge   Problem: Clinical Measurements: Goal: Ability to maintain clinical measurements within normal limits will improve 04/29/2022 1639 by Unknown Jim, RN Outcome: Adequate for Discharge 04/29/2022 1639 by Unknown Jim, RN Outcome: Adequate for Discharge Goal: Will remain free from infection 04/29/2022 1639 by Unknown Jim, RN Outcome: Adequate for Discharge 04/29/2022 1639 by Unknown Jim, RN Outcome: Adequate for Discharge Goal: Diagnostic test results will improve 04/29/2022 1639 by Unknown Jim, RN Outcome: Adequate for Discharge 04/29/2022 1639 by Unknown Jim, RN Outcome: Adequate for Discharge Goal:  Respiratory complications will improve 04/29/2022 1639 by Unknown Jim, RN Outcome: Adequate for Discharge 04/29/2022 1639 by Unknown Jim, RN Outcome: Adequate for Discharge Goal: Cardiovascular complication will be avoided 04/29/2022 1639 by  Unknown Jim, RN Outcome: Adequate for Discharge 04/29/2022 1639 by Unknown Jim, RN Outcome: Adequate for Discharge   Problem: Activity: Goal: Risk for activity intolerance will decrease 04/29/2022 1639 by Unknown Jim, RN Outcome: Adequate for Discharge 04/29/2022 1639 by Unknown Jim, RN Outcome: Adequate for Discharge   Problem: Nutrition: Goal: Adequate nutrition will be maintained 04/29/2022 1639 by Unknown Jim, RN Outcome: Adequate for Discharge 04/29/2022 1639 by Unknown Jim, RN Outcome: Adequate for Discharge   Problem: Coping: Goal: Level of anxiety will decrease 04/29/2022 1639 by Unknown Jim, RN Outcome: Adequate for Discharge 04/29/2022 1639 by Unknown Jim, RN Outcome: Adequate for Discharge   Problem: Elimination: Goal: Will not experience complications related to bowel motility 04/29/2022 1639 by Unknown Jim, RN Outcome: Adequate for Discharge 04/29/2022 1639 by Unknown Jim, RN Outcome: Adequate for Discharge Goal: Will not experience complications related to urinary retention 04/29/2022 1639 by Unknown Jim, RN Outcome: Adequate for Discharge 04/29/2022 1639 by Unknown Jim, RN Outcome: Adequate for Discharge   Problem: Pain Managment: Goal: General experience of comfort will improve 04/29/2022 1639 by Unknown Jim, RN Outcome: Adequate for Discharge 04/29/2022 1639 by Unknown Jim, RN Outcome: Adequate for Discharge   Problem: Safety: Goal: Ability to remain free from injury will improve 04/29/2022 1639 by Unknown Jim, RN Outcome: Adequate for Discharge 04/29/2022 1639 by Unknown Jim, RN Outcome: Adequate for Discharge    Problem: Skin Integrity: Goal: Risk for impaired skin integrity will decrease 04/29/2022 1639 by Unknown Jim, RN Outcome: Adequate for Discharge 04/29/2022 1639 by Unknown Jim, RN Outcome: Adequate for Discharge   Problem: Activity: Goal: Ability to tolerate increased activity will improve 04/29/2022 1639 by Unknown Jim, RN Outcome: Adequate for Discharge 04/29/2022 1639 by Unknown Jim, RN Outcome: Adequate for Discharge   Problem: Respiratory: Goal: Ability to maintain a clear airway and adequate ventilation will improve 04/29/2022 1639 by Unknown Jim, RN Outcome: Adequate for Discharge 04/29/2022 1639 by Unknown Jim, RN Outcome: Adequate for Discharge   Problem: Role Relationship: Goal: Method of communication will improve 04/29/2022 1639 by Unknown Jim, RN Outcome: Adequate for Discharge 04/29/2022 1639 by Unknown Jim, RN Outcome: Adequate for Discharge

## 2022-04-29 NOTE — Progress Notes (Signed)
[place] DNR form completed and placed in chart.  Julian Hy, DO 04/29/22 11:45 AM [facility]

## 2022-04-29 NOTE — Discharge Summary (Signed)
Physician Discharge Summary  Patient ID: Jennifer Pacheco MRN: [id number] DOB/AGE: [birth year] 66 y.o.  Admit date: 04/26/2022 Discharge date: 04/29/2022  Admission Diagnoses:  Acute respiratory failure with hypoxia  Discharge Diagnoses:  Principal Problem:   Acute metabolic encephalopathy Active Problems:   Pressure injury of skin   Malnutrition of moderate degree   On mechanically assisted ventilation (HCC)   Altered mental status   Acute cystitis without hematuria   Aspiration pneumonia of both lower lobes (HCC)   Meningioma (HCC)   End of life care Bilateral lower lobe aspiration pneumonia-present on admission Staph epidermidis urinary tract infection-present on admission Brain meningioma with left hemiplegia-present on admission Recent pulmonary embolism on chronic anticoagulation-present on admission Acute blood loss anemia due to retroperitoneal hemorrhage, improving-present on admission Hyperglycemia with uncontrolled diabetes-present on admission Refeeding syndrome with hypokalemia, hypocalcemia, hypophosphatemia Hypernatremia Coagulopathy due to sepsis-present on admission Acute metabolic encephalopathy due to sepsis-present on admission   Discharged Condition: critical  Hospital Course: Jennifer Pacheco is a 66 year old woman with a history of right meningioma with left hemiplegia and inability to care for herself or make medical decisions, recent PE, acute blood loss anemia on anticoagulation who presented with acute respiratory failure and encephalopathy due to aspiration pneumonia.  She required mechanical ventilation for ventilatory support.  She was supported with antibiotics and supplemental nutrition.  Due to her severe comorbidities and progressive decline her guardians elected to proceed with focusing on her comfort and withdrawing aggressive support.  She will be discharged after terminal extubation to [facility] inpatient hospice unit.  Consults:  pulmonary/intensive care and palliative care  Significant Diagnostic Studies:     Latest Ref Rng & Units 04/28/2022   12:48 AM 04/27/2022    1:03 AM 04/26/2022    9:44 PM  BMP  Glucose 70 - 99 mg/dL 209  229    BUN 8 - 23 mg/dL 29  21    Creatinine 0.44 - 1.00 mg/dL 0.39  0.66    Sodium 135 - 145 mmol/L 146  144  147   Potassium 3.5 - 5.1 mmol/L 3.2  2.8  2.7   Chloride 98 - 111 mmol/L 115  114    CO2 22 - 32 mmol/L 23  19    Calcium 8.9 - 10.3 mg/dL 8.8  8.5        Latest Ref Rng & Units 04/28/2022   12:48 AM 04/27/2022    1:03 AM 04/26/2022    9:44 PM  CBC  WBC 4.0 - 10.5 K/uL 24.7  27.2    Hemoglobin 12.0 - 15.0 g/dL 13.5  14.7  13.6   Hematocrit 36.0 - 46.0 % 41.5  47.8  40.0   Platelets 150 - 400 K/uL 335  318     Urine culture> staph epidermis Blood cultures> NGTD  Treatments:  IV antibiotics Respiratory support with mechanical ventilation, supplemental oxygen Supplemental nutrition Anticoagulation End-of-life care with symptom management    Disposition:   [facility] inpatient hospice   Allergies as of 04/29/2022       Reactions   Penicillins Anaphylaxis        Medication List     STOP taking these medications    apixaban 5 MG Tabs tablet Commonly known as: ELIQUIS   calcium carbonate 500 MG chewable tablet Commonly known as: TUMS - dosed in mg elemental calcium   gabapentin 300 MG capsule Commonly known as: Neurontin   omeprazole 20 MG capsule Commonly known as: PRILOSEC   pantoprazole 40 MG  tablet Commonly known as: PROTONIX   potassium chloride SA 20 MEQ tablet Commonly known as: KLOR-CON M         Signed: Julian Hy 04/29/2022, 2:13 PM

## 2022-04-29 NOTE — Progress Notes (Signed)
[place] 3M04 [facility]) hospital liaison note   Mrs. Blizard was extubated at 2pm today and does not show any signs of distress and seems stable but unresponsive. Spoke with DSS representative Jaynee Eagles who agrees to transfer to [facility] for comfort care. Patient's sister Janae Bridgeman and brother-in-law Joretta Bachelor was present for extubation. Family aware of transfer and have been given the number and address for [facility].     RN please call report to [phone] prior to patient leaving the unit.   Please send signed and completed DNR with patient at discharge.   Thank you,  Zigmund Gottron RN [facility] liaison [phone]

## 2022-04-29 NOTE — IPAL (Signed)
  Interdisciplinary Goals of Care Family Meeting   Date carried out: 04/29/2022  Location of the meeting: Phone conference  Member's involved: Physician and Social Worker  Durable Power of Attorney or acting medical decision maker: Jaynee Eagles from [place]    Discussion: We discussed goals of care for [employer] .  We discussed her ongoing care.  The previous plan with the sister had been that Ms. Mcnutt's sister would like to be at the hospital during terminal extubation.  Due to lack of family involvement throughout her care, it seems unlikely that her other immediate family will be coming to the hospital, and we would like to try and avoid additional suffering.  Her sister is communicated to the hospice organization that she can be at bedside between 1 and 2 today.  Planning on terminal extubation and transition to full comfort this afternoon.  Code status: Full DNR  Disposition: In-patient comfort care   Time spent for the meeting: 10 min.  Julian Hy 04/29/2022, 11:15 AM

## 2022-05-01 LAB — CULTURE, BLOOD (ROUTINE X 2)
Culture: NO GROWTH
Culture: NO GROWTH
Special Requests: ADEQUATE
